# Patient Record
Sex: Female | Born: 1950 | Race: Black or African American | Hispanic: No | Marital: Single | Smoking: Never smoker
Health system: Western US, Academic
[De-identification: ages and names within clinical notes are randomized; demographics above are authoritative.]

## PROBLEM LIST (undated history)

## (undated) DIAGNOSIS — M1990 Unspecified osteoarthritis, unspecified site: Secondary | ICD-10-CM

## (undated) DIAGNOSIS — R51 Headache: Secondary | ICD-10-CM

## (undated) DIAGNOSIS — C439 Malignant melanoma of skin, unspecified: Secondary | ICD-10-CM

## (undated) DIAGNOSIS — E669 Obesity, unspecified: Secondary | ICD-10-CM

## (undated) DIAGNOSIS — I639 Cerebral infarction, unspecified: Secondary | ICD-10-CM

## (undated) DIAGNOSIS — I10 Essential (primary) hypertension: Secondary | ICD-10-CM

## (undated) DIAGNOSIS — E785 Hyperlipidemia, unspecified: Secondary | ICD-10-CM

## (undated) DIAGNOSIS — I208 Other forms of angina pectoris: Principal | ICD-10-CM

## (undated) DIAGNOSIS — Z8489 Family history of other specified conditions: Secondary | ICD-10-CM

## (undated) DIAGNOSIS — C55 Malignant neoplasm of uterus, part unspecified: Secondary | ICD-10-CM

## (undated) DIAGNOSIS — K8309 Other cholangitis: Secondary | ICD-10-CM

## (undated) DIAGNOSIS — M6281 Muscle weakness (generalized): Secondary | ICD-10-CM

## (undated) HISTORY — DX: Malignant melanoma of skin, unspecified: C43.9

## (undated) HISTORY — DX: Unspecified osteoarthritis, unspecified site: M19.90

## (undated) HISTORY — DX: Essential (primary) hypertension: I10

## (undated) HISTORY — DX: Hyperlipidemia, unspecified: E78.5

## (undated) HISTORY — DX: Other forms of angina pectoris: I20.8

## (undated) HISTORY — DX: Obesity, unspecified: E66.9

## (undated) HISTORY — PX: THYROIDECTOMY: SHX17

## (undated) HISTORY — PX: HYSTERECTOMY: SHX81

## (undated) HISTORY — DX: Other cholangitis: K83.09

## (undated) HISTORY — DX: Cerebral infarction, unspecified (CMS-HCC): I63.9

## (undated) SURGERY — ENDOSCOPIC RETROGRADE CHOLANGIOPANCREATOGRAPHY (ERCP)
Anesthesia: Monitored Anesthesia Care (MAC)

## (undated) MED ORDER — LEVOTHYROXINE SODIUM 125 MCG OR TABS
125.00 ug | ORAL_TABLET | Freq: Every day | ORAL | 0 refills | Status: AC
Start: 2018-08-05 — End: ?

## (undated) MED ORDER — NALOXONE HCL 0.4 MG/ML IJ SOLN
0.40 mg | Freq: Once | INTRAMUSCULAR | 0 refills | Status: AC
Start: 2018-04-23 — End: 2018-04-23

## (undated) MED ORDER — HYDROCODONE-ACETAMINOPHEN 5-325 MG OR TABS
1.00 | ORAL_TABLET | Freq: Four times a day (QID) | ORAL | 0 refills | Status: AC | PRN
Start: 2018-06-08 — End: ?

## (undated) MED ORDER — SODIUM CHLORIDE 0.9 % IV SOLN
INTRAVENOUS | Status: AC
Start: 2017-07-24 — End: ?

## (undated) MED ORDER — SYRINGE 23G X 1" 3 ML MISC
Status: AC
Start: 2018-04-23 — End: ?

## (undated) MED ORDER — HYDROCODONE-ACETAMINOPHEN 5-325 MG OR TABS
1.00 | ORAL_TABLET | Freq: Four times a day (QID) | ORAL | 0 refills | Status: AC | PRN
Start: 2018-04-27 — End: ?

## (undated) MED ORDER — LEVOFLOXACIN 500 MG OR TABS
500.00 mg | ORAL_TABLET | Freq: Every day | ORAL | Status: AC
Start: 2018-06-14 — End: ?

## (undated) MED ORDER — CLINDAMYCIN PHOSPHATE IN D5W 600 MG/50ML IV SOLN
600.00 mg | Freq: Three times a day (TID) | INTRAVENOUS | Status: AC
Start: 2018-06-13 — End: ?

## (undated) MED FILL — CHLORHEXIDINE GLUCONATE 0.12 % MT SOLN: 0.12 % | OROMUCOSAL | 16 days supply | Qty: 473 | Fill #0

## (undated) MED FILL — ASCORBIC ACID 500 MG OR TABS: 500 MG | ORAL | 30 days supply | Qty: 30 | Fill #0

---

## 1987-06-14 HISTORY — PX: HEMORRHOID SURGERY: SHX153

## 1999-10-28 ENCOUNTER — Encounter: Admission: RE | Admit: 1999-10-28 | Discharge: 1999-10-28 | Payer: Self-pay | Admitting: Family Medicine

## 1999-10-28 ENCOUNTER — Encounter: Payer: Self-pay | Admitting: Family Medicine

## 2001-10-13 HISTORY — PX: NM MYOCAR PERF WALL MOTION: HXRAD629

## 2001-11-03 ENCOUNTER — Other Ambulatory Visit: Admission: RE | Admit: 2001-11-03 | Discharge: 2001-11-03 | Payer: Self-pay | Admitting: Gynecology

## 2004-03-27 ENCOUNTER — Other Ambulatory Visit: Admission: RE | Admit: 2004-03-27 | Discharge: 2004-03-27 | Payer: Self-pay | Admitting: Gynecology

## 2006-06-05 ENCOUNTER — Encounter: Admission: RE | Admit: 2006-06-05 | Discharge: 2006-06-05 | Payer: Self-pay | Admitting: Family Medicine

## 2006-06-11 ENCOUNTER — Encounter: Admission: RE | Admit: 2006-06-11 | Discharge: 2006-06-11 | Payer: Self-pay | Admitting: Family Medicine

## 2010-03-03 ENCOUNTER — Encounter
Admission: RE | Admit: 2010-03-03 | Discharge: 2010-03-03 | Payer: Self-pay | Admitting: Physical Medicine and Rehabilitation

## 2011-01-06 ENCOUNTER — Other Ambulatory Visit: Payer: Self-pay | Admitting: Neurological Surgery

## 2011-01-06 DIAGNOSIS — M47812 Spondylosis without myelopathy or radiculopathy, cervical region: Secondary | ICD-10-CM

## 2011-01-18 ENCOUNTER — Ambulatory Visit
Admission: RE | Admit: 2011-01-18 | Discharge: 2011-01-18 | Disposition: A | Discharge status: Home / Self Care | Payer: BLUE CROSS/BLUE SHIELD | Source: Ambulatory Visit | Attending: Neurological Surgery | Admitting: Neurological Surgery

## 2011-01-18 DIAGNOSIS — M47812 Spondylosis without myelopathy or radiculopathy, cervical region: Secondary | ICD-10-CM

## 2011-03-11 ENCOUNTER — Other Ambulatory Visit (HOSPITAL_COMMUNITY): Payer: BLUE CROSS/BLUE SHIELD

## 2011-03-12 ENCOUNTER — Ambulatory Visit (HOSPITAL_COMMUNITY)
Admission: RE | Admit: 2011-03-12 | Discharge: 2011-03-12 | Disposition: A | Discharge status: Home / Self Care | Payer: BC Managed Care – PPO | Source: Ambulatory Visit | Attending: Orthopedic Surgery | Admitting: Orthopedic Surgery

## 2011-03-12 ENCOUNTER — Encounter (HOSPITAL_COMMUNITY)
Admission: RE | Admit: 2011-03-12 | Discharge: 2011-03-12 | Disposition: A | Discharge status: Home / Self Care | Payer: BC Managed Care – PPO | Source: Ambulatory Visit | Attending: Orthopedic Surgery | Admitting: Orthopedic Surgery

## 2011-03-12 ENCOUNTER — Other Ambulatory Visit (HOSPITAL_COMMUNITY): Payer: Self-pay | Admitting: Orthopedic Surgery

## 2011-03-12 DIAGNOSIS — M1611 Unilateral primary osteoarthritis, right hip: Secondary | ICD-10-CM

## 2011-03-12 DIAGNOSIS — M169 Osteoarthritis of hip, unspecified: Secondary | ICD-10-CM | POA: Insufficient documentation

## 2011-03-12 DIAGNOSIS — Z01812 Encounter for preprocedural laboratory examination: Secondary | ICD-10-CM | POA: Insufficient documentation

## 2011-03-12 DIAGNOSIS — Z01818 Encounter for other preprocedural examination: Secondary | ICD-10-CM | POA: Insufficient documentation

## 2011-03-12 DIAGNOSIS — M1610 Unilateral primary osteoarthritis, unspecified hip: Secondary | ICD-10-CM | POA: Insufficient documentation

## 2011-03-12 DIAGNOSIS — K449 Diaphragmatic hernia without obstruction or gangrene: Secondary | ICD-10-CM | POA: Insufficient documentation

## 2011-03-12 LAB — SURGICAL PCR SCREEN
MRSA, PCR: NEGATIVE
Staphylococcus aureus: POSITIVE — AB

## 2011-03-12 LAB — PROTIME-INR
INR: 0.94 (ref 0.00–1.49)
Prothrombin Time: 12.8 seconds (ref 11.6–15.2)

## 2011-03-12 LAB — URINALYSIS, ROUTINE W REFLEX MICROSCOPIC
Bilirubin Urine: NEGATIVE
Glucose, UA: NEGATIVE mg/dL
Hgb urine dipstick: NEGATIVE
Ketones, ur: NEGATIVE mg/dL
Nitrite: NEGATIVE
Protein, ur: NEGATIVE mg/dL
Specific Gravity, Urine: 1.004 — ABNORMAL LOW (ref 1.005–1.030)
Urobilinogen, UA: 0.2 mg/dL (ref 0.0–1.0)
pH: 8 (ref 5.0–8.0)

## 2011-03-12 LAB — TYPE AND SCREEN
ABO/RH(D): O POS
Antibody Screen: NEGATIVE

## 2011-03-12 LAB — CBC
HCT: 42.9 % (ref 36.0–46.0)
Hemoglobin: 14.8 g/dL (ref 12.0–15.0)
MCH: 28 pg (ref 26.0–34.0)
MCHC: 34.5 g/dL (ref 30.0–36.0)
MCV: 81.3 fL (ref 78.0–100.0)
Platelets: 225 10*3/uL (ref 150–400)
RBC: 5.28 MIL/uL — ABNORMAL HIGH (ref 3.87–5.11)
RDW: 14.1 % (ref 11.5–15.5)
WBC: 10.1 10*3/uL (ref 4.0–10.5)

## 2011-03-12 LAB — BASIC METABOLIC PANEL
BUN: 11 mg/dL (ref 6–23)
CO2: 29 mEq/L (ref 19–32)
Calcium: 10.1 mg/dL (ref 8.4–10.5)
Chloride: 99 mEq/L (ref 96–112)
Creatinine, Ser: 1.26 mg/dL — ABNORMAL HIGH (ref 0.4–1.2)
GFR calc Af Amer: 52 mL/min — ABNORMAL LOW (ref >60)
GFR calc non Af Amer: 43 mL/min — ABNORMAL LOW (ref >60)
Glucose, Bld: 84 mg/dL (ref 70–99)
Potassium: 3.8 mEq/L (ref 3.5–5.1)
Sodium: 140 mEq/L (ref 135–145)

## 2011-03-12 LAB — DIFFERENTIAL
Basophils Absolute: 0.1 10*3/uL (ref 0.0–0.1)
Basophils Relative: 1 % (ref 0–1)
Eosinophils Absolute: 0.2 10*3/uL (ref 0.0–0.7)
Eosinophils Relative: 2 % (ref 0–5)
Lymphocytes Relative: 20 % (ref 12–46)
Lymphs Abs: 2 10*3/uL (ref 0.7–4.0)
Monocytes Absolute: 0.7 10*3/uL (ref 0.1–1.0)
Monocytes Relative: 7 % (ref 3–12)
Neutro Abs: 7.1 10*3/uL (ref 1.7–7.7)
Neutrophils Relative %: 71 % (ref 43–77)

## 2011-03-12 LAB — URINE MICROSCOPIC-ADD ON

## 2011-03-12 LAB — ABO/RH: ABO/RH(D): O POS

## 2011-03-12 LAB — APTT: aPTT: 29 seconds (ref 24–37)

## 2011-03-14 HISTORY — PX: TOTAL HIP ARTHROPLASTY: SHX124

## 2011-03-17 ENCOUNTER — Inpatient Hospital Stay (HOSPITAL_COMMUNITY): Payer: BC Managed Care – PPO

## 2011-03-17 ENCOUNTER — Inpatient Hospital Stay (HOSPITAL_COMMUNITY)
Admission: RE | Admit: 2011-03-17 | Discharge: 2011-03-20 | DRG: 818 | Disposition: A | Discharge status: Home Health | Payer: BC Managed Care – PPO | Source: Ambulatory Visit | Attending: Orthopedic Surgery | Admitting: Orthopedic Surgery

## 2011-03-17 DIAGNOSIS — Z01812 Encounter for preprocedural laboratory examination: Secondary | ICD-10-CM

## 2011-03-17 DIAGNOSIS — Z7982 Long term (current) use of aspirin: Secondary | ICD-10-CM

## 2011-03-17 DIAGNOSIS — K449 Diaphragmatic hernia without obstruction or gangrene: Secondary | ICD-10-CM | POA: Diagnosis present

## 2011-03-17 DIAGNOSIS — I10 Essential (primary) hypertension: Secondary | ICD-10-CM | POA: Diagnosis present

## 2011-03-17 DIAGNOSIS — M169 Osteoarthritis of hip, unspecified: Principal | ICD-10-CM | POA: Diagnosis present

## 2011-03-17 DIAGNOSIS — M1610 Unilateral primary osteoarthritis, unspecified hip: Principal | ICD-10-CM | POA: Diagnosis present

## 2011-03-17 DIAGNOSIS — Z8673 Personal history of transient ischemic attack (TIA), and cerebral infarction without residual deficits: Secondary | ICD-10-CM

## 2011-03-18 LAB — CBC
HCT: 30.9 % — ABNORMAL LOW (ref 36.0–46.0)
Hemoglobin: 10.2 g/dL — ABNORMAL LOW (ref 12.0–15.0)
MCH: 27.3 pg (ref 26.0–34.0)
MCHC: 33 g/dL (ref 30.0–36.0)
MCV: 82.8 fL (ref 78.0–100.0)
Platelets: 160 10*3/uL (ref 150–400)
RBC: 3.73 MIL/uL — ABNORMAL LOW (ref 3.87–5.11)
RDW: 14.7 % (ref 11.5–15.5)
WBC: 9.3 10*3/uL (ref 4.0–10.5)

## 2011-03-18 LAB — BASIC METABOLIC PANEL
BUN: 11 mg/dL (ref 6–23)
CO2: 29 mEq/L (ref 19–32)
Calcium: 8.8 mg/dL (ref 8.4–10.5)
Chloride: 100 mEq/L (ref 96–112)
Creatinine, Ser: 1.06 mg/dL (ref 0.4–1.2)
GFR calc Af Amer: >60 mL/min (ref >60)
GFR calc non Af Amer: 53 mL/min — ABNORMAL LOW (ref >60)
Glucose, Bld: 128 mg/dL — ABNORMAL HIGH (ref 70–99)
Potassium: 3.2 mEq/L — ABNORMAL LOW (ref 3.5–5.1)
Sodium: 137 mEq/L (ref 135–145)

## 2011-03-18 LAB — PROTIME-INR
INR: 1.09 (ref 0.00–1.49)
Prothrombin Time: 14.3 seconds (ref 11.6–15.2)

## 2011-03-19 LAB — CBC
HCT: 31.1 % — ABNORMAL LOW (ref 36.0–46.0)
Hemoglobin: 10.3 g/dL — ABNORMAL LOW (ref 12.0–15.0)
MCH: 27.4 pg (ref 26.0–34.0)
MCHC: 33.1 g/dL (ref 30.0–36.0)
MCV: 82.7 fL (ref 78.0–100.0)
Platelets: 152 10*3/uL (ref 150–400)
RBC: 3.76 MIL/uL — ABNORMAL LOW (ref 3.87–5.11)
RDW: 14.8 % (ref 11.5–15.5)
WBC: 11.8 10*3/uL — ABNORMAL HIGH (ref 4.0–10.5)

## 2011-03-19 LAB — PROTIME-INR
INR: 1.37 (ref 0.00–1.49)
Prothrombin Time: 17.1 seconds — ABNORMAL HIGH (ref 11.6–15.2)

## 2011-03-20 LAB — CBC
HCT: 31.7 % — ABNORMAL LOW (ref 36.0–46.0)
Hemoglobin: 10.6 g/dL — ABNORMAL LOW (ref 12.0–15.0)
MCH: 27.8 pg (ref 26.0–34.0)
MCHC: 33.4 g/dL (ref 30.0–36.0)
MCV: 83.2 fL (ref 78.0–100.0)
Platelets: 140 10*3/uL — ABNORMAL LOW (ref 150–400)
RBC: 3.81 MIL/uL — ABNORMAL LOW (ref 3.87–5.11)
RDW: 14.5 % (ref 11.5–15.5)
WBC: 10.3 10*3/uL (ref 4.0–10.5)

## 2011-03-20 LAB — PROTIME-INR
INR: 1.57 — ABNORMAL HIGH (ref 0.00–1.49)
Prothrombin Time: 19 seconds — ABNORMAL HIGH (ref 11.6–15.2)

## 2011-03-25 NOTE — Op Note (Signed)
Morgan Bell, Morgan Bell              ACCOUNT NO.:  [id number]  MEDICAL RECORD NO.:  [id number]  LOCATION:                                 FACILITY:  PHYSICIAN:  Feliberto Gottron. Turner Daniels, M.D.   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  On the femoral side, we used a 18 x 13 x 42 x 150 SROM stem with an 18D large cone.  SURGEON:  Feliberto Gottron. Turner Daniels, MD  FIRST ASSISTANT:  Shirl Harris, PA-C.  ANESTHETIC:  General endotracheal.  ESTIMATED BLOOD LOSS:  400 mL.  FLUID REPLACEMENT:  1500 mL crystalloid.  DRAINS PLACED:  Foley catheter.  URINE OUTPUT:  300 mL.  INDICATIONS FOR PROCEDURE:  The patient is a 60 year old woman with end- stage arthritis, right hip bone-on-bone with varus configuration to the femoral neck.  She has failed conservative measures with physical therapy, anti-inflammatory medicine, narcotics and desires elective right total hip arthroplasty to decrease pain and increase function. Risks and benefits of surgery discussed, questions answered.  DESCRIPTION OF PROCEDURE:  The patient identified by armband, received preoperative IV antibiotics in the holding area at [facility], taken to operating room 5, appropriate site monitors were attached and general endotracheal anesthesia induced with the patient in supine position.  Foley catheter inserted.  She was then rolled into the left lateral decubitus position and fixed there with a Stulberg Mark II pelvic clamp.  Right lower extremity prepped and draped in usual  sterile fashion from the ankle to hemipelvis.  Time-out procedure performed. Skin along the lateral hip and thigh infiltrated with 10 mL of 0.5% Marcaine and epinephrine solution and a 15-cm incision was made centered over the greater trochanter allowing posterolateral approach to the hip joint.  Small bleeders in the skin and subcutaneous tissue identified and cauterized.  IT band was cut in line with skin incision exposing the greater trochanter.  Cobra retractors were placed between the gluteus minimus and superior hip joint capsule and quadratus femoris and the inferior hip joint capsule.  This isolated the piriformis and short external rotators which were then tagged with a #2 Ethibond suture and cut off their insertion on the intertrochanteric crest exposing posterior aspect of the hip joint capsule which developed an acetabular based flap going from posterior-superior off the acetabular rim out over the femoral neck and exiting posterior inferior on the acetabular rim. This was likewise tagged to #2 Ethibond sutures and exposed thearthritic femoral head.  The hip flexed and internal rotated, standard neck cut performed one fingerbreadth above the femoral head with the oscillating saw.  The proximal femur was then translated anteriorly levering off the anterior column with a Hohmann retractor.  Spike Cobra was placed in the cotyloid notch and posterior inferior wing retractor placed at the junction of the  ischium and the acetabulum to allow for removal of the labrum.  We then sequentially reamed up from a 47-mm basket reamer to a 49-mm basket reamer obtaining good coverage in all quadrants in preparation for insertion of a 50-mm pinnacle cup with no holes and 40-45 degrees of abduction and about 15 degrees of anteversion.  The stem was irrigated and the cup inserted without difficulty with good fit and fill.  Trial liner 10 degree index posterior and superior was inserted in  preparation for a 32-mm femoral head.  We then flexed and internally rotated the hip and actually reamed up to a 13.5-mm axial reamer to full depth with good chatter to 14-mm half depth and then reamed the cone up to an 18D cone of the appropriate depth for a 42 base neck.  The calcar was milled to large calcar and an 18 D large trial cone was then placed.  Prior to the calcar milling, we did ream up to an 18D calcar again for the appropriate depth for 42 base neck.  An 18D large trial cone had been inserted followed by a trial stem.  We did antevert about 10 degrees in relation to the calcar because the calcar maybe 5 degrees of anteversion in relation to the epicondylar axis of the knee.  Trial reduction was then performed with a 32 trial head.  It was noted to be stable to flexion of 90 internal rotation of almost 75 external rotation and extension.  There is no impingement at 40 degrees and it could not be dislocated anteriorly.  At this point, all the trial components removed.  We inserted a real 10 degree polyethylene liner to accept 32-mm head after first placing the central occluder.  The index on the polyethylene liner was posterior- superior.  We then hammered into place a real 18D large ATT1 cone.  At this time, there was noted to be a very small crack just above the cone in the greater trochanter that involved one-quarter inch of the posterior aspect and did not propagate beyond the posterolateral corner. We stressed the lateral and anterior portions significantly and the crack was not noted to propagate.  It was felt to be stable and left alone.  After the stem had been fully inserted and seated, a +0 32-mm ceramic head was hammered onto the stem and the hip was reduced and stability once again checked.  We also checked trochanter to make sure it was stable and it was.  Also the crack was below the origin of the vastus lateralis, so it had muscle pull to stabilize it as well  but it was above the shoulder of the stem.  At this point, the wound was irrigated out with normal saline solution.  The capsular flap and short external rotators were repaired back to the intertrochanteric crest through drill holes.  We irrigated one more time.  The IT band was closed with running #1 Vicryl suture, the subcutaneous tissue with 0 and 2-0 undyed Vicryl suture and skin with running interlocking 3-0 nylon suture.  Dressing of Xeroform and Mepilex was then applied.  The patient was unclamped, rolled supine, awakened, extubated, and taken to the recovery room without difficulty.     Feliberto Gottron. Turner Daniels, M.D.     Ovid Curd  D:  03/17/2011  T:  03/17/2011  Job:  [id number]  Electronically Signed by Gean Birchwood M.D. on 03/24/2011 11:10:19 PM

## 2011-04-05 NOTE — Discharge Summary (Signed)
  [place], [place]              ACCOUNT NO.:  [id number]  MEDICAL RECORD NO.:  [id number]  LOCATION:  5033                         FACILITY:  [facility]  PHYSICIAN:  Feliberto Gottron. Turner Daniels, M.D.   DATE OF BIRTH:  04-09-1951  DATE OF ADMISSION:  03/17/2011 DATE OF DISCHARGE:  03/20/2011                              DISCHARGE SUMMARY   CHIEF COMPLAINT:  Right hip pain.  HISTORY OF PRESENT ILLNESS:  This is a 60 year old lady who complains of severe unremitting pain in her right hip despite extensive conservative treatment and activity modification.  She now desires a surgical intervention.  All risks and benefits of surgery were discussed with the patient.  PAST MEDICAL HISTORY:  Significant for hypertension.  ALLERGIES:  She has an allergy to SULFA.  PAST SURGICAL HISTORY:  She has never had surgery.  SOCIAL HISTORY:  She denies use of tobacco or alcohol.  FAMILY HISTORY:  Noncontributory.  OBJECTIVE:  Gross examination of the right hip demonstrates pain with internal rotation and a positive foot tap.  X-rays of the right hip demonstrates bone-on-bone degenerative joint disease.  PREOPERATIVE LABORATORY DATA:  White blood cells 9.3, red blood cells 3.73, hemoglobin 10.2, hematocrit 30.9, and platelets 160.  PT 14.3 and INR 1.09.  Sodium 137, potassium 3.2, chloride 100, glucose 128, BUN 11, and creatinine 0.06.  Urinalysis was within normal limits.  HOSPITAL COURSE:  Ms. Peckenpaugh was admitted to [facility] on March 17, 2011 when she underwent right total hip arthroplasty.  The procedure was performed by Dr. Gean Birchwood and the patient tolerated it well. Perioperative Foley catheter was placed and she was transferred to the floor on Lovenox and Coumadin for DVT prophylaxis.  On the first postoperative day, she was awake and alert and tolerating p.o. intake. Her Foley catheter was removed after physical therapy.  Hemoglobin was 10.2.  On the second postoperative day, she was awake but  groggy so her pain medication was reduced.  Hemoglobin was 10.3.  There is a scant drainage on the surgical dressing.  On postoperative day #3, she was much more alert and was reporting good pain control.  She was eating well and ambulating independently, so she was discharged home.  DISPOSITION:  The patient was discharged home on March 20, 2011.  She was weightbearing as tolerated and would return to the clinic in 10 days for x-rays and suture removal.  She will remain on Coumadin for 2 weeks with a target INR of 1.5-2.0.  This will be managed by her Home Health company.  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     Shirl Harris, PA   ______________________________ Feliberto Gottron. Turner Daniels, M.D.    JW/MEDQ  D:  04/02/2011  T:  04/03/2011  Job:  [id number]  Electronically Signed by Shirl Harris PA on 04/04/2011 01:27:39 PM Electronically Signed by Gean Birchwood M.D. on 04/05/2011 08:05:05 PM

## 2013-01-25 ENCOUNTER — Encounter (INDEPENDENT_AMBULATORY_CARE_PROVIDER_SITE_OTHER): Payer: Self-pay

## 2013-01-26 ENCOUNTER — Encounter (INDEPENDENT_AMBULATORY_CARE_PROVIDER_SITE_OTHER): Payer: Self-pay

## 2013-01-31 ENCOUNTER — Ambulatory Visit (INDEPENDENT_AMBULATORY_CARE_PROVIDER_SITE_OTHER): Payer: No Typology Code available for payment source | Admitting: General Surgery

## 2013-01-31 ENCOUNTER — Encounter (INDEPENDENT_AMBULATORY_CARE_PROVIDER_SITE_OTHER): Payer: Self-pay | Admitting: General Surgery

## 2013-01-31 ENCOUNTER — Ambulatory Visit (INDEPENDENT_AMBULATORY_CARE_PROVIDER_SITE_OTHER): Payer: PRIVATE HEALTH INSURANCE | Admitting: General Surgery

## 2013-01-31 ENCOUNTER — Telehealth (INDEPENDENT_AMBULATORY_CARE_PROVIDER_SITE_OTHER): Payer: Self-pay | Admitting: General Surgery

## 2013-01-31 VITALS — BP 150/88 | HR 102 | Temp 97.0°F | Resp 22 | Ht 62.5 in | Wt 186.6 lb

## 2013-01-31 DIAGNOSIS — C4359 Malignant melanoma of other part of trunk: Secondary | ICD-10-CM | POA: Insufficient documentation

## 2013-01-31 NOTE — Patient Instructions (Signed)
Will get lymphoscintogram and then schedule wide excision and sentinel node mapping

## 2013-01-31 NOTE — Telephone Encounter (Signed)
Spoke with patient she is aware of  appt day 02/10/13 at [address] [facility]

## 2013-01-31 NOTE — Progress Notes (Signed)
Subjective:     Patient ID: Morgan Bell, female   DOB: 1951-06-30, 62 y.o.   MRN: [id number]  HPI We're asked to see the patient in consultation by Dr. Terri Piedra to evaluate her for a melanoma on her abdominal wall. The patient is a 62 year old white female who has had small moles on her scan for a long time. She recently went for her first check by a dermatologist who biopsied 2 lesions. The one on her left shoulder was benign. The one on her right upper quadrant abdominal wall came back as a melanoma. She states that it was not ulcerated prior to the biopsy. She denied any itching or bleeding. She had not noticed it changed recently but said that it was irregular around the border.  Review of Systems  Constitutional: Negative.   HENT: Negative.   Eyes: Negative.   Respiratory: Negative.   Cardiovascular: Negative.   Gastrointestinal: Negative.   Endocrine: Negative.   Genitourinary: Negative.   Musculoskeletal: Negative.   Skin: Negative.   Allergic/Immunologic: Negative.   Neurological: Negative.   Hematological: Negative.   Psychiatric/Behavioral: Negative.        Objective:   Physical Exam  Constitutional: She is oriented to person, place, and time. She appears well-developed and well-nourished.  HENT:  Head: Normocephalic and atraumatic.  Eyes: Conjunctivae and EOM are normal. Pupils are equal, round, and reactive to light.  Neck: Normal range of motion. Neck supple.  Cardiovascular: Normal rate, regular rhythm and normal heart sounds.   Pulmonary/Chest: Effort normal and breath sounds normal.  There is no palpable axillary or supraclavicular or cervical lymphadenopathy  Abdominal: Soft. Bowel sounds are normal. She exhibits no mass. There is no tenderness.  There is a small eschar in the right upper quadrant of the abdominal wall. There is no palpable groin lymphadenopathy  Musculoskeletal: Normal range of motion.  Lymphadenopathy:    She has no cervical adenopathy.   Neurological: She is alert and oriented to person, place, and time.  Skin: Skin is warm and dry.  Psychiatric: She has a normal mood and affect. Her behavior is normal.       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 Because of this I think she would be best treated with a wide excision of the area as well as a sentinel node mapping. I have discussed with her in detail the risks and benefits of the operation to this as well as some of the technical aspects and she understands and wishes to proceed     Plan:     Plan for initial lymphoscintigraphy to identify the appropriate nodal basin and then will plan for wide excision of the area with sentinel node mapping

## 2013-02-10 ENCOUNTER — Encounter (HOSPITAL_COMMUNITY)
Admission: RE | Admit: 2013-02-10 | Discharge: 2013-02-10 | Disposition: A | Discharge status: Home / Self Care | Payer: No Typology Code available for payment source | Source: Ambulatory Visit | Attending: General Surgery | Admitting: General Surgery

## 2013-02-10 DIAGNOSIS — C4359 Malignant melanoma of other part of trunk: Secondary | ICD-10-CM | POA: Insufficient documentation

## 2013-02-10 MED ORDER — TECHNETIUM TC 99M SULFUR COLLOID FILTERED
0.5000 | Freq: Once | INTRAVENOUS | Status: AC | PRN
  Administered 2013-02-10: 0.5 via INTRADERMAL

## 2013-02-16 ENCOUNTER — Other Ambulatory Visit (INDEPENDENT_AMBULATORY_CARE_PROVIDER_SITE_OTHER): Payer: Self-pay | Admitting: General Surgery

## 2013-02-16 DIAGNOSIS — C439 Malignant melanoma of skin, unspecified: Secondary | ICD-10-CM

## 2013-02-28 ENCOUNTER — Encounter (HOSPITAL_COMMUNITY)
Admission: RE | Admit: 2013-02-28 | Discharge: 2013-02-28 | Disposition: A | Discharge status: Home / Self Care | Payer: No Typology Code available for payment source | Source: Ambulatory Visit | Attending: General Surgery | Admitting: General Surgery

## 2013-02-28 ENCOUNTER — Encounter (HOSPITAL_COMMUNITY): Payer: Self-pay

## 2013-02-28 ENCOUNTER — Encounter (HOSPITAL_COMMUNITY): Payer: Self-pay | Admitting: Pharmacy Technician

## 2013-02-28 ENCOUNTER — Ambulatory Visit (HOSPITAL_COMMUNITY)
Admission: RE | Admit: 2013-02-28 | Discharge: 2013-02-28 | Disposition: A | Discharge status: Home / Self Care | Payer: No Typology Code available for payment source | Source: Ambulatory Visit | Attending: General Surgery | Admitting: General Surgery

## 2013-02-28 DIAGNOSIS — Z01818 Encounter for other preprocedural examination: Secondary | ICD-10-CM | POA: Insufficient documentation

## 2013-02-28 DIAGNOSIS — K449 Diaphragmatic hernia without obstruction or gangrene: Secondary | ICD-10-CM | POA: Insufficient documentation

## 2013-02-28 DIAGNOSIS — I10 Essential (primary) hypertension: Secondary | ICD-10-CM | POA: Insufficient documentation

## 2013-02-28 HISTORY — DX: Cerebral infarction, unspecified: I63.9

## 2013-02-28 HISTORY — DX: Headache: R51

## 2013-02-28 LAB — SURGICAL PCR SCREEN
MRSA, PCR: NEGATIVE
Staphylococcus aureus: POSITIVE — AB

## 2013-02-28 LAB — CBC WITH DIFFERENTIAL/PLATELET
Basophils Absolute: 0.1 10*3/uL (ref 0.0–0.1)
Basophils Relative: 1 % (ref 0–1)
Eosinophils Absolute: 0.3 10*3/uL (ref 0.0–0.7)
Eosinophils Relative: 3 % (ref 0–5)
HCT: 37.3 % (ref 36.0–46.0)
Hemoglobin: 11.9 g/dL — ABNORMAL LOW (ref 12.0–15.0)
Lymphocytes Relative: 21 % (ref 12–46)
Lymphs Abs: 2.3 10*3/uL (ref 0.7–4.0)
MCH: 26.2 pg (ref 26.0–34.0)
MCHC: 31.9 g/dL (ref 30.0–36.0)
MCV: 82 fL (ref 78.0–100.0)
Monocytes Absolute: 1 10*3/uL (ref 0.1–1.0)
Monocytes Relative: 9 % (ref 3–12)
Neutro Abs: 7.5 10*3/uL (ref 1.7–7.7)
Neutrophils Relative %: 67 % (ref 43–77)
Platelets: 314 10*3/uL (ref 150–400)
RBC: 4.55 MIL/uL (ref 3.87–5.11)
RDW: 15.2 % (ref 11.5–15.5)
WBC: 11.1 10*3/uL — ABNORMAL HIGH (ref 4.0–10.5)

## 2013-02-28 LAB — COMPREHENSIVE METABOLIC PANEL
ALT: 23 U/L (ref 0–35)
AST: 34 U/L (ref 0–37)
Albumin: 3.8 g/dL (ref 3.5–5.2)
Alkaline Phosphatase: 88 U/L (ref 39–117)
BUN: 26 mg/dL — ABNORMAL HIGH (ref 6–23)
CO2: 25 mEq/L (ref 19–32)
Calcium: 10.4 mg/dL (ref 8.4–10.5)
Chloride: 97 mEq/L (ref 96–112)
Creatinine, Ser: 1.21 mg/dL — ABNORMAL HIGH (ref 0.50–1.10)
GFR calc Af Amer: 54 mL/min — ABNORMAL LOW (ref >90)
GFR calc non Af Amer: 46 mL/min — ABNORMAL LOW (ref >90)
Glucose, Bld: 103 mg/dL — ABNORMAL HIGH (ref 70–99)
Potassium: 3 mEq/L — ABNORMAL LOW (ref 3.5–5.1)
Sodium: 139 mEq/L (ref 135–145)
Total Bilirubin: 0.2 mg/dL — ABNORMAL LOW (ref 0.3–1.2)
Total Protein: 7.4 g/dL (ref 6.0–8.3)

## 2013-02-28 NOTE — Pre-Procedure Instructions (Signed)
CHAVY AVERA  02/28/2013   Your procedure is scheduled on:   Wednesday  03/02/13   Report to [facility] at 930 AM.  Call this number if you have problems the morning of surgery: [phone]   Remember:   Do not eat food or drink liquids after midnight.   Take these medicines the morning of surgery with A SIP OF WATER:  CLARITIN, GABAPENTIN (STOP ASPIRIN, COUMADIN, PLAVIX, EFFIENT, HERBAL MEDICINES)   Do not wear jewelry, make-up or nail polish.  Do not wear lotions, powders, or perfumes. You may wear deodorant.  Do not shave 48 hours prior to surgery. Men may shave face and neck.  Do not bring valuables to the hospital.  Contacts, dentures or bridgework may not be worn into surgery.  Leave suitcase in the car. After surgery it may be brought to your room.  For patients admitted to the hospital, checkout time is 11:00 AM the day of  discharge.   Patients discharged the day of surgery will not be allowed to drive  home.  Name and phone number of your driver:   Special Instructions: Shower using CHG 2 nights before surgery and the night before surgery.  If you shower the day of surgery use CHG.  Use special wash - you have one bottle of CHG for all showers.  You should use approximately 1/3 of the bottle for each shower.   Please read over the following fact sheets that you were given: Pain Booklet, Coughing and Deep Breathing, MRSA Information and Surgical Site Infection Prevention

## 2013-02-28 NOTE — Progress Notes (Signed)
REQUESTED LAST EKG, OFFICE NOTE, ANY CARDIAC TESTS FROM [facility]  [phone].

## 2013-02-28 NOTE — Progress Notes (Signed)
CONFIRMED WITH SHANNON IN [facility] THAT PATIENT IS SCHEDULED FOR 1000 AM 03/02/13.

## 2013-02-28 NOTE — Progress Notes (Signed)
02/28/13 1516  OBSTRUCTIVE SLEEP APNEA  Have you ever been diagnosed with sleep apnea through a sleep study? No  Do you snore loudly (loud enough to be heard through closed doors)?  1  Do you often feel tired, fatigued, or sleepy during the daytime? 1  Has anyone observed you stop breathing during your sleep? 0  Do you have, or are you being treated for high blood pressure? 1  BMI more than 35 kg/m2? 1  Age over 64 years old? 1  Neck circumference greater than 40 cm/18 inches? 0 (15 IN )  Gender: 0  Obstructive Sleep Apnea Score 5  Score 4 or greater  Results sent to PCP

## 2013-03-01 MED ORDER — CHLORHEXIDINE GLUCONATE 4 % EX LIQD: 1.0000 "application " | Freq: Once | CUTANEOUS | Status: DC

## 2013-03-01 MED ORDER — CEFAZOLIN SODIUM-DEXTROSE 2-3 GM-% IV SOLR
2.0000 g | INTRAVENOUS | Status: AC
  Administered 2013-03-02: 2 g via INTRAVENOUS
  Filled 2013-03-01: qty 50

## 2013-03-01 NOTE — Progress Notes (Signed)
Anesthesia Chart Review:  Patient is a 62 year old female scheduled for wide excision right abdominal wall melanoma and SN mapping, right axilla by Dr. Carolynne Edouard on 03/02/13.  History includes non-smoker, TIA > 20 years ago, HTN, melanoma, migraine headaches, arthritis, right THA '12, obesity, large hiatal hernia by 02/2013 CXR. OSA screening score was 5.  No PCP is listed.    She is followed at [facility] for HTN and obesity, and was last seen by Dr. Rennis Golden on 11/19/12.  EKG then showed NSR, non-specific T wave abnormality.  She has not had a recent stress or echo.   CXR report on 02/28/13 showed: The heart size is normal. No pleural effusion or edema identified. There is no airspace consolidation identified. The patient has a large hiatal hernia containing fluid level. There is compressive type atelectasis identified within the medial left lower lobe. Spondylosis identified within the thoracic spine.  Preoperative labs noted.  K+ 3.0.  Cr 1.21.  WBC 11.1. Glucose 103.  Preoperative testing results appear within an acceptable range from an anesthesia standpoint.  She will be evaluated by her assigned anesthesiologist on the day of surgery, but if no significant change then would anticipate she could proceed as planned.  Morgan Bell [facility]/Anesthesiology Phone [phone] 03/01/2013 11:57 AM

## 2013-03-02 ENCOUNTER — Ambulatory Visit (HOSPITAL_COMMUNITY): Payer: No Typology Code available for payment source | Admitting: Anesthesiology

## 2013-03-02 ENCOUNTER — Encounter (HOSPITAL_COMMUNITY): Payer: Self-pay | Admitting: Vascular Surgery

## 2013-03-02 ENCOUNTER — Encounter (HOSPITAL_COMMUNITY)
Admission: RE | Admit: 2013-03-02 | Discharge: 2013-03-02 | Disposition: A | Discharge status: Home / Self Care | Payer: No Typology Code available for payment source | Source: Ambulatory Visit | Attending: General Surgery | Admitting: General Surgery

## 2013-03-02 ENCOUNTER — Encounter (HOSPITAL_COMMUNITY): Payer: Self-pay | Admitting: *Deleted

## 2013-03-02 ENCOUNTER — Ambulatory Visit (HOSPITAL_COMMUNITY)
Admission: RE | Admit: 2013-03-02 | Discharge: 2013-03-02 | Disposition: A | Discharge status: Home / Self Care | Payer: No Typology Code available for payment source | Source: Ambulatory Visit | Attending: General Surgery | Admitting: General Surgery

## 2013-03-02 ENCOUNTER — Encounter (HOSPITAL_COMMUNITY)
Admission: RE | Disposition: A | Discharge status: Home / Self Care | Payer: Self-pay | Source: Ambulatory Visit | Attending: General Surgery

## 2013-03-02 DIAGNOSIS — C4359 Malignant melanoma of other part of trunk: Secondary | ICD-10-CM | POA: Insufficient documentation

## 2013-03-02 DIAGNOSIS — I10 Essential (primary) hypertension: Secondary | ICD-10-CM | POA: Insufficient documentation

## 2013-03-02 DIAGNOSIS — C439 Malignant melanoma of skin, unspecified: Secondary | ICD-10-CM

## 2013-03-02 HISTORY — PX: MELANOMA EXCISION WITH SENTINEL LYMPH NODE BIOPSY: SHX5267

## 2013-03-02 SURGERY — MELANOMA EXCISION WITH SENTINEL LYMPH NODE BIOPSY
Anesthesia: General | Laterality: Right

## 2013-03-02 MED ORDER — LACTATED RINGERS IV SOLN
INTRAVENOUS | Status: DC | PRN
  Administered 2013-03-02: 11:00:00 via INTRAVENOUS

## 2013-03-02 MED ORDER — HYDROMORPHONE HCL PF 1 MG/ML IJ SOLN
INTRAMUSCULAR | Status: AC
  Filled 2013-03-02: qty 1

## 2013-03-02 MED ORDER — HYDROMORPHONE HCL PF 1 MG/ML IJ SOLN: 0.2500 mg | INTRAMUSCULAR | Status: DC | PRN

## 2013-03-02 MED ORDER — LIDOCAINE HCL (CARDIAC) 20 MG/ML IV SOLN
INTRAVENOUS | Status: DC | PRN
  Administered 2013-03-02: 80 mg via INTRAVENOUS

## 2013-03-02 MED ORDER — HYDROCODONE-ACETAMINOPHEN 5-325 MG PO TABS: 1.0000 | ORAL_TABLET | ORAL | Status: DC | PRN

## 2013-03-02 MED ORDER — FENTANYL CITRATE 0.05 MG/ML IJ SOLN
INTRAMUSCULAR | Status: DC | PRN
  Administered 2013-03-02: 25 ug via INTRAVENOUS
  Administered 2013-03-02: 50 ug via INTRAVENOUS
  Administered 2013-03-02: 25 ug via INTRAVENOUS
  Administered 2013-03-02: 50 ug via INTRAVENOUS
  Administered 2013-03-02: 25 ug via INTRAVENOUS

## 2013-03-02 MED ORDER — PROPOFOL 10 MG/ML IV BOLUS
INTRAVENOUS | Status: DC | PRN
  Administered 2013-03-02: 200 mg via INTRAVENOUS

## 2013-03-02 MED ORDER — METHYLENE BLUE 1 % INJ SOLN
INTRAMUSCULAR | Status: AC
  Filled 2013-03-02: qty 10

## 2013-03-02 MED ORDER — PHENYLEPHRINE HCL 10 MG/ML IJ SOLN
INTRAMUSCULAR | Status: DC | PRN
  Administered 2013-03-02 (×2): 80 ug via INTRAVENOUS
  Administered 2013-03-02 (×2): 40 ug via INTRAVENOUS
  Administered 2013-03-02 (×2): 80 ug via INTRAVENOUS

## 2013-03-02 MED ORDER — MUPIROCIN 2 % EX OINT
TOPICAL_OINTMENT | CUTANEOUS | Status: AC
  Administered 2013-03-02: 10:00:00
  Filled 2013-03-02: qty 22

## 2013-03-02 MED ORDER — MUPIROCIN CALCIUM 2 % EX CREA: TOPICAL_CREAM | Freq: Two times a day (BID) | CUTANEOUS | Status: DC

## 2013-03-02 MED ORDER — METHYLENE BLUE 1 % INJ SOLN
INTRAMUSCULAR | Status: DC | PRN
  Administered 2013-03-02: 2 mL via SUBMUCOSAL

## 2013-03-02 MED ORDER — 0.9 % SODIUM CHLORIDE (POUR BTL) OPTIME
TOPICAL | Status: DC | PRN
  Administered 2013-03-02: 1000 mL

## 2013-03-02 MED ORDER — BUPIVACAINE-EPINEPHRINE PF 0.25-1:200000 % IJ SOLN
INTRAMUSCULAR | Status: AC
  Filled 2013-03-02: qty 30

## 2013-03-02 MED ORDER — ONDANSETRON HCL 4 MG/2ML IJ SOLN
INTRAMUSCULAR | Status: DC | PRN
  Administered 2013-03-02: 4 mg via INTRAVENOUS

## 2013-03-02 MED ORDER — BUPIVACAINE-EPINEPHRINE PF 0.25-1:200000 % IJ SOLN
INTRAMUSCULAR | Status: DC | PRN
  Administered 2013-03-02: 30 mL

## 2013-03-02 MED ORDER — DEXAMETHASONE SODIUM PHOSPHATE 10 MG/ML IJ SOLN
INTRAMUSCULAR | Status: DC | PRN
  Administered 2013-03-02: 8 mg via INTRAVENOUS

## 2013-03-02 MED ORDER — TECHNETIUM TC 99M SULFUR COLLOID FILTERED
0.5000 | Freq: Once | INTRAVENOUS | Status: AC | PRN
  Administered 2013-03-02: 0.5 via INTRADERMAL

## 2013-03-02 MED ORDER — SODIUM CHLORIDE 0.9 % IJ SOLN
INTRAMUSCULAR | Status: DC | PRN
  Administered 2013-03-02: 3 mL via INTRAVENOUS

## 2013-03-02 SURGICAL SUPPLY — 59 items
ADH SKN CLS APL DERMABOND .7 (GAUZE/BANDAGES/DRESSINGS) ×2
APL SKNCLS STERI-STRIP NONHPOA (GAUZE/BANDAGES/DRESSINGS)
APPLIER CLIP 11 MED OPEN (CLIP)
APR CLP MED 11 20 MLT OPN (CLIP)
BENZOIN TINCTURE PRP APPL 2/3 (GAUZE/BANDAGES/DRESSINGS) ×1 IMPLANT
BLADE SURG CLIPPER 3M 9600 (MISCELLANEOUS) ×2 IMPLANT
CANISTER SUCTION 2500CC (MISCELLANEOUS) ×2 IMPLANT
CHLORAPREP W/TINT 26ML (MISCELLANEOUS) ×2 IMPLANT
CLIP APPLIE 11 MED OPEN (CLIP) IMPLANT
CLOTH BEACON ORANGE TIMEOUT ST (SAFETY) ×2 IMPLANT
CONT SPEC 4OZ CLIKSEAL STRL BL (MISCELLANEOUS) ×2 IMPLANT
COVER PROBE W GEL 5X96 (DRAPES) ×2 IMPLANT
COVER SURGICAL LIGHT HANDLE (MISCELLANEOUS) ×2 IMPLANT
DERMABOND ADVANCED (GAUZE/BANDAGES/DRESSINGS) ×2
DERMABOND ADVANCED .7 DNX12 (GAUZE/BANDAGES/DRESSINGS) IMPLANT
DRAPE LAPAROSCOPIC ABDOMINAL (DRAPES) ×1 IMPLANT
DRAPE UTILITY 15X26 W/TAPE STR (DRAPE) ×4 IMPLANT
ELECT CAUTERY BLADE 6.4 (BLADE) ×2 IMPLANT
ELECT REM PT RETURN 9FT ADLT (ELECTROSURGICAL) ×2
ELECTRODE REM PT RTRN 9FT ADLT (ELECTROSURGICAL) ×1 IMPLANT
EVACUATOR SILICONE 100CC (DRAIN) IMPLANT
GLOVE BIO SURGEON STRL SZ7.5 (GLOVE) ×3 IMPLANT
GLOVE BIOGEL PI IND STRL 6.5 (GLOVE) IMPLANT
GLOVE BIOGEL PI IND STRL 7.5 (GLOVE) ×1 IMPLANT
GLOVE BIOGEL PI INDICATOR 6.5 (GLOVE) ×1
GLOVE BIOGEL PI INDICATOR 7.5 (GLOVE) ×1
GLOVE ECLIPSE 7.5 STRL STRAW (GLOVE) ×1 IMPLANT
GOWN STRL NON-REIN LRG LVL3 (GOWN DISPOSABLE) ×4 IMPLANT
KIT BASIN OR (CUSTOM PROCEDURE TRAY) ×2 IMPLANT
KIT ROOM TURNOVER OR (KITS) ×2 IMPLANT
NDL 18GX1X1/2 (RX/OR ONLY) (NEEDLE) ×1 IMPLANT
NDL HYPO 25GX1X1/2 BEV (NEEDLE) ×1 IMPLANT
NEEDLE 18GX1X1/2 (RX/OR ONLY) (NEEDLE) ×2 IMPLANT
NEEDLE HYPO 25GX1X1/2 BEV (NEEDLE) ×4 IMPLANT
NS IRRIG 1000ML POUR BTL (IV SOLUTION) ×2 IMPLANT
PACK GENERAL/GYN (CUSTOM PROCEDURE TRAY) ×2 IMPLANT
PACK UNIVERSAL I (CUSTOM PROCEDURE TRAY) ×1 IMPLANT
PAD ARMBOARD 7.5X6 YLW CONV (MISCELLANEOUS) ×3 IMPLANT
SPECIMEN JAR MEDIUM (MISCELLANEOUS) IMPLANT
SPONGE GAUZE 4X4 12PLY (GAUZE/BANDAGES/DRESSINGS) IMPLANT
SPONGE LAP 18X18 X RAY DECT (DISPOSABLE) IMPLANT
SPONGE LAP 4X18 X RAY DECT (DISPOSABLE) ×2 IMPLANT
STAPLER VISISTAT 35W (STAPLE) IMPLANT
STRIP CLOSURE SKIN 1/2X4 (GAUZE/BANDAGES/DRESSINGS) ×1 IMPLANT
SUT MNCRL AB 4-0 PS2 18 (SUTURE) ×2 IMPLANT
SUT MON AB 4-0 PC3 18 (SUTURE) ×4 IMPLANT
SUT SILK 2 0 FS (SUTURE) ×1 IMPLANT
SUT SILK 2 0 SH (SUTURE) IMPLANT
SUT VIC AB 2-0 BRD 54 (SUTURE) IMPLANT
SUT VIC AB 2-0 SH 18 (SUTURE) IMPLANT
SUT VIC AB 3-0 54X BRD REEL (SUTURE) ×1 IMPLANT
SUT VIC AB 3-0 BRD 54 (SUTURE) ×2
SUT VIC AB 3-0 SH 18 (SUTURE) ×1 IMPLANT
SUT VIC AB 3-0 SH 27 (SUTURE) ×4
SUT VIC AB 3-0 SH 27XBRD (SUTURE) ×1 IMPLANT
SYR CONTROL 10ML LL (SYRINGE) ×3 IMPLANT
TOWEL OR 17X24 6PK STRL BLUE (TOWEL DISPOSABLE) IMPLANT
TOWEL OR 17X26 10 PK STRL BLUE (TOWEL DISPOSABLE) ×2 IMPLANT
WATER STERILE IRR 1000ML POUR (IV SOLUTION) ×1 IMPLANT

## 2013-03-02 NOTE — Op Note (Signed)
03/02/2013  12:34 PM  PATIENT:  Morgan Bell  63 y.o. female  PRE-OPERATIVE DIAGNOSIS:  right abdominal wall melanoma  POST-OPERATIVE DIAGNOSIS:  right abdominal wall melanoma  PROCEDURE:  Procedure(s): Wide MELANOMA EXCISION Right Abdominal wall WITH SENTINEL LYMPH NODE mapping Right Axilla (Right)  SURGEON:  Surgeon(s) and Role:    * Robyne Askew, MD - Primary  PHYSICIAN ASSISTANT:   ASSISTANTS: none   ANESTHESIA:   general  EBL:     BLOOD ADMINISTERED:none  DRAINS: none   LOCAL MEDICATIONS USED:  MARCAINE     SPECIMEN:  Source of Specimen:  wide excision abdominal wall melanoma and sentinel node  DISPOSITION OF SPECIMEN:  PATHOLOGY  COUNTS:  YES  TOURNIQUET:  * No tourniquets in log *  DICTATION: .Dragon Dictation After informed consent was obtained the patient was brought to the operating room and placed in the supine position on the operating room table. After adequate induction of general anesthesia the patient's abdomen and right axilla were prepped with ChloraPrep, allowed to dry, and draped in usual sterile manner. Earlier in the day the patient underwent injection of 1 mCi of technetium sulfur colloid in the area around the melanoma on the right upper abdominal wall. At this point, 2 cc methylene blue and 3 cc of injectable saline were also injected in the area around the previous melanoma site. The area was massaged for several minutes. A neoprobe was then used to identify a hot spot in the right axilla. A small transverse incision was made overlying the hot spot with the 15 blade knife. This incision was carried through the skin and subcutaneous tissue sharply with electrocautery until the axilla was entered. A wheatland retractor was deployed. The neoprobe was used to direct blunt hemostat dissection in the right axilla until a hot blue lymph node was identified. This was excised by combination of sharp and Bovie dissection and then the lymphatics were  clamped with hemostats, divided, and ligated with 3-0 Vicryl ties. This was sent as sentinel node #1. This was probably a cluster of 2 or 3 lymph nodes. No other hot, blue, or palpable lymph nodes were identified in the right axilla. The deep layer the wound was closed with interrupted 3-0 Vicryl stitches. The skin was then closed with interrupted 4 Monocryl subcuticular stitches. The area was infiltrated with quarter percent Marcaine. Attention was then turned to the right upper abdominal wall. A margin of 2 cm around the melanoma was marked and an elliptical incision was made to maintain this margin. This was done with a 15 blade knife. This incision was carried through the skin and subcutaneous tissue sharply with the electrocautery until the fascia of the abdominal wall was encountered. The specimen was removed completely. It was marked with a short stitch superior and a long stitch to the left. Hemostasis was achieved using the Bovie electrocautery. The wound was irrigated with copious amounts of saline and infiltrated with quarter percent Marcaine. The deep layer the wound was closed with 2 layers of interrupted 0 Vicryl stitches. The skin was then closed with interrupted 4-0 Monocryl subcuticular stitches. Dermabond dressings were applied. The patient tolerated the procedure well. At the end of the case all needle sponge and instrument counts are correct. The patient was then awakened and taken to recovery in stable condition.  PLAN OF CARE: Discharge to home after PACU  PATIENT DISPOSITION:  PACU - hemodynamically stable.   Delay start of Pharmacological VTE agent (>24hrs) due to surgical  blood loss or risk of bleeding: not applicable

## 2013-03-02 NOTE — Anesthesia Postprocedure Evaluation (Signed)
  Anesthesia Post-op Note  Patient: Morgan Bell  Procedure(s) Performed: Procedure(s): Wide MELANOMA EXCISION Right Abdominal wall WITH SENTINEL LYMPH NODE mapping Right Axilla (Right)  Patient Location: PACU  Anesthesia Type:General  Level of Consciousness: awake  Airway and Oxygen Therapy: Patient Spontanous Breathing  Post-op Pain: mild  Post-op Assessment: Post-op Vital signs reviewed  Post-op Vital Signs: Reviewed  Complications: No apparent anesthesia complications

## 2013-03-02 NOTE — Preoperative (Signed)
Beta Blockers   Reason not to administer Beta Blockers:Not Applicable 

## 2013-03-02 NOTE — Interval H&P Note (Signed)
History and Physical Interval Note:  03/02/2013 10:14 AM  Morgan Bell  has presented today for surgery, with the diagnosis of melanoma  The various methods of treatment have been discussed with the patient and family. After consideration of risks, benefits and other options for treatment, the patient has consented to  Procedure(s): Wide MELANOMA EXCISION Right Abdominal wall WITH SENTINEL LYMPH NODE mapping Right Axilla (Right) as a surgical intervention .  The patient's history has been reviewed, patient examined, no change in status, stable for surgery.  I have reviewed the patient's chart and labs.  Questions were answered to the patient's satisfaction.     TOTH III,PAUL S

## 2013-03-02 NOTE — H&P (View-Only) (Signed)
Subjective:     Patient ID: Morgan Bell, female   DOB: 02/17/1951, 62 y.o.   MRN: [id number]  HPI We're asked to see the patient in consultation by Dr. Lupton to evaluate her for a melanoma on her abdominal wall. The patient is a 62-year-old white female who has had small moles on her scan for a long time. She recently went for her first check by a dermatologist who biopsied 2 lesions. The one on her left shoulder was benign. The one on her right upper quadrant abdominal wall came back as a melanoma. She states that it was not ulcerated prior to the biopsy. She denied any itching or bleeding. She had not noticed it changed recently but said that it was irregular around the border.  Review of Systems  Constitutional: Negative.   HENT: Negative.   Eyes: Negative.   Respiratory: Negative.   Cardiovascular: Negative.   Gastrointestinal: Negative.   Endocrine: Negative.   Genitourinary: Negative.   Musculoskeletal: Negative.   Skin: Negative.   Allergic/Immunologic: Negative.   Neurological: Negative.   Hematological: Negative.   Psychiatric/Behavioral: Negative.        Objective:   Physical Exam  Constitutional: She is oriented to person, place, and time. She appears well-developed and well-nourished.  HENT:  Head: Normocephalic and atraumatic.  Eyes: Conjunctivae and EOM are normal. Pupils are equal, round, and reactive to light.  Neck: Normal range of motion. Neck supple.  Cardiovascular: Normal rate, regular rhythm and normal heart sounds.   Pulmonary/Chest: Effort normal and breath sounds normal.  There is no palpable axillary or supraclavicular or cervical lymphadenopathy  Abdominal: Soft. Bowel sounds are normal. She exhibits no mass. There is no tenderness.  There is a small eschar in the right upper quadrant of the abdominal wall. There is no palpable groin lymphadenopathy  Musculoskeletal: Normal range of motion.  Lymphadenopathy:    She has no cervical adenopathy.   Neurological: She is alert and oriented to person, place, and time.  Skin: Skin is warm and dry.  Psychiatric: She has a normal mood and affect. Her behavior is normal.       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 Because of this I think she would be best treated with a wide excision of the area as well as a sentinel node mapping. I have discussed with her in detail the risks and benefits of the operation to this as well as some of the technical aspects and she understands and wishes to proceed     Plan:     Plan for initial lymphoscintigraphy to identify the appropriate nodal basin and then will plan for wide excision of the area with sentinel node mapping       

## 2013-03-02 NOTE — Anesthesia Preprocedure Evaluation (Addendum)
Anesthesia Evaluation  Patient identified by MRN, date of birth, ID band Patient awake    Reviewed: Allergy & Precautions, H&P , NPO status , Patient's Chart, lab work & pertinent test results  Airway Mallampati: II      Dental   Pulmonary neg pulmonary ROS,  breath sounds clear to auscultation        Cardiovascular hypertension, Pt. on medications Rhythm:Regular Rate:Normal     Neuro/Psych    GI/Hepatic negative GI ROS, Neg liver ROS,   Endo/Other    Renal/GU negative Renal ROS     Musculoskeletal   Abdominal   Peds  Hematology   Anesthesia Other Findings   Reproductive/Obstetrics                          Anesthesia Physical Anesthesia Plan  ASA: III  Anesthesia Plan: General   Post-op Pain Management:    Induction: Intravenous  Airway Management Planned: Oral ETT  Additional Equipment:   Intra-op Plan:   Post-operative Plan: Extubation in OR  Informed Consent: I have reviewed the patients History and Physical, chart, labs and discussed the procedure including the risks, benefits and alternatives for the proposed anesthesia with the patient or authorized representative who has indicated his/her understanding and acceptance.   Dental advisory given  Plan Discussed with: CRNA, Anesthesiologist and Surgeon  Anesthesia Plan Comments:       Anesthesia Quick Evaluation

## 2013-03-02 NOTE — Transfer of Care (Signed)
Immediate Anesthesia Transfer of Care Note  Patient: Morgan Bell  Procedure(s) Performed: Procedure(s): Wide MELANOMA EXCISION Right Abdominal wall WITH SENTINEL LYMPH NODE mapping Right Axilla (Right)  Patient Location: PACU  Anesthesia Type:General  Level of Consciousness: awake, alert  and oriented  Airway & Oxygen Therapy: Patient Spontanous Breathing and Patient connected to nasal cannula oxygen  Post-op Assessment: Report given to PACU RN and Post -op Vital signs reviewed and stable  Post vital signs: Reviewed and stable  Complications: No apparent anesthesia complications

## 2013-03-03 ENCOUNTER — Encounter (HOSPITAL_COMMUNITY): Payer: Self-pay | Admitting: General Surgery

## 2013-03-10 ENCOUNTER — Telehealth (INDEPENDENT_AMBULATORY_CARE_PROVIDER_SITE_OTHER): Payer: Self-pay

## 2013-03-10 NOTE — Telephone Encounter (Signed)
Let patient know her path results showed no residual melanoma and node was negative

## 2013-03-14 ENCOUNTER — Telehealth (INDEPENDENT_AMBULATORY_CARE_PROVIDER_SITE_OTHER): Payer: Self-pay | Admitting: General Surgery

## 2013-03-14 NOTE — Telephone Encounter (Signed)
Pt called to state there is "some clear seepage" coming from incision now.  Reassured her that this drainage is not out of the ordinary, to wash with soap and water, pat dry and cover with absorbant dressing.  She understands.

## 2013-03-18 ENCOUNTER — Encounter (INDEPENDENT_AMBULATORY_CARE_PROVIDER_SITE_OTHER): Payer: Self-pay | Admitting: General Surgery

## 2013-03-18 ENCOUNTER — Ambulatory Visit (INDEPENDENT_AMBULATORY_CARE_PROVIDER_SITE_OTHER): Payer: PRIVATE HEALTH INSURANCE | Admitting: General Surgery

## 2013-03-18 VITALS — BP 124/86 | HR 84 | Temp 97.3°F | Resp 16 | Ht 62.5 in | Wt 189.8 lb

## 2013-03-18 DIAGNOSIS — C4359 Malignant melanoma of other part of trunk: Secondary | ICD-10-CM

## 2013-03-18 MED ORDER — DOXYCYCLINE HYCLATE 100 MG PO TABS: 100.0000 mg | ORAL_TABLET | Freq: Two times a day (BID) | ORAL | Status: DC

## 2013-03-18 NOTE — Patient Instructions (Signed)
Continue to shower Cover area with antibiotic ointment and gauze Doxycycline for 7 days

## 2013-04-04 ENCOUNTER — Encounter (INDEPENDENT_AMBULATORY_CARE_PROVIDER_SITE_OTHER): Payer: Self-pay | Admitting: General Surgery

## 2013-04-04 ENCOUNTER — Ambulatory Visit (INDEPENDENT_AMBULATORY_CARE_PROVIDER_SITE_OTHER): Payer: PRIVATE HEALTH INSURANCE | Admitting: General Surgery

## 2013-04-04 VITALS — BP 130/82 | HR 80 | Resp 16 | Ht 62.5 in | Wt 188.0 lb

## 2013-04-04 DIAGNOSIS — C4359 Malignant melanoma of other part of trunk: Secondary | ICD-10-CM

## 2013-04-04 NOTE — Progress Notes (Signed)
Subjective:     Patient ID: Morgan Bell, female   DOB: 27-Oct-1950, 62 y.o.   MRN: [id number]  HPI The patient is a 62 year old white female who is one month status post wide excision of a melanoma from her abdominal wall. Her wound has completely healed at this point. She only has some occasional soreness associated with it.  Review of Systems     Objective:   Physical Exam On exam her abdominal incision an axillary incision have healed nicely with no sign of infection.    Assessment:     The patient is one month status post wide excision of a melanoma from her abdominal wall     Plan:     At this point I think she can begin returning to her normal activities without restrictions. We will refer her to medical oncology to see if there is any other adjuvant therapy this should be done. Otherwise we will see her back in about 3 months

## 2013-04-04 NOTE — Progress Notes (Signed)
Subjective:     Patient ID: Morgan Bell, female   DOB: April 03, 1951, 62 y.o.   MRN: [id number]  HPI The patient is a 62 year old white female who is about 2 weeks status post wide excision of a melanoma from her epigastric region of her abdominal wall and negative sentinel node mapping. She has done well since the surgery. She has some mild soreness associated with the incision. She has had a little bit of skin separation along the midportion of the incision. There is no sign of infection or significant seroma.  Review of Systems     Objective:   Physical Exam On exam her abdomen is soft and nontender. Most of the incision has healed well. There is some slight skin separation along the midportion of the incision    Assessment:     The patient is 2 weeks status post wide excision of melanoma from her abdominal wall     Plan:     At this point I would like her to keep the incision covered with an antibiotic ointment and gauze. We will plan to see her back in a couple weeks to check her progress. Her pathology showed no residual disease and her lymph node was negative. We will plan to refer her back to the medical oncologist to see if there is any other adjuvant therapy she will need

## 2013-04-04 NOTE — Addendum Note (Signed)
Addended by: Caleen Essex III on: 04/04/2013 02:49 PM   Modules accepted: Orders

## 2013-04-05 ENCOUNTER — Telehealth: Payer: Self-pay | Admitting: Oncology

## 2013-04-05 NOTE — Telephone Encounter (Signed)
S/W PT IN RE NP APPT 07/09 @ 3 W/DR. HA REFERRING DR. TOTH DX- MELANOMA OF TRUNK WELCOME PACKET MAILED.   REFERRING OFFICE AWARE OF APPT.

## 2013-04-06 ENCOUNTER — Telehealth: Payer: Self-pay | Admitting: Oncology

## 2013-04-06 NOTE — Telephone Encounter (Signed)
C/D 04/06/13 for appt. 04/20/13

## 2013-04-20 ENCOUNTER — Ambulatory Visit (HOSPITAL_BASED_OUTPATIENT_CLINIC_OR_DEPARTMENT_OTHER): Payer: No Typology Code available for payment source

## 2013-04-20 ENCOUNTER — Encounter: Payer: Self-pay | Admitting: Oncology

## 2013-04-20 ENCOUNTER — Ambulatory Visit: Payer: No Typology Code available for payment source | Admitting: Oncology

## 2013-04-20 ENCOUNTER — Other Ambulatory Visit: Payer: No Typology Code available for payment source | Admitting: Lab

## 2013-04-20 ENCOUNTER — Ambulatory Visit: Payer: No Typology Code available for payment source

## 2013-04-20 ENCOUNTER — Ambulatory Visit (HOSPITAL_BASED_OUTPATIENT_CLINIC_OR_DEPARTMENT_OTHER): Payer: No Typology Code available for payment source | Admitting: Oncology

## 2013-04-20 VITALS — BP 165/95 | HR 103 | Temp 97.9°F | Resp 18 | Ht 62.0 in | Wt 186.1 lb

## 2013-04-20 DIAGNOSIS — M899 Disorder of bone, unspecified: Secondary | ICD-10-CM

## 2013-04-20 DIAGNOSIS — M1990 Unspecified osteoarthritis, unspecified site: Secondary | ICD-10-CM

## 2013-04-20 DIAGNOSIS — M2540 Effusion, unspecified joint: Secondary | ICD-10-CM

## 2013-04-20 DIAGNOSIS — C4359 Malignant melanoma of other part of trunk: Secondary | ICD-10-CM

## 2013-04-20 DIAGNOSIS — R229 Localized swelling, mass and lump, unspecified: Secondary | ICD-10-CM

## 2013-04-20 NOTE — Progress Notes (Signed)
Checked in new patient. No financial issues. I didn't ask is POA/living will. Email/ph/mail for communciation.

## 2013-04-21 NOTE — Progress Notes (Signed)
[facility]  Telephone:([phone] Fax:([phone]) [phone]   MEDICAL ONCOLOGY - INITIAL CONSULATION    Referral MD:  Chevis Pretty, M.D.   Reason for Referral: melanoma.    HPI:  [name] is a 62 year-old woman.  She had freckles all her life. She went to Dr. Terri Bell for a concerning mole on her xyphoid process.  Biopsy showed 0.39mm, melanoma, with low mitotic rate and without ulceration.  She was referred to Dr. Carolynne Bell for wide excision and sentinel node biopsy.  There was no residual melanoma.  Sentinel node biopsy was negative.  She was kindly referred to Med Onc.   [name] presented to the clinic by herself today.  She reported that she had healed well from the surgery.  She denied any erythema, bleeding, palpable nodes.  She has diffuse bone pain, worst in her hands with nodules.    Patient denies fever, anorexia, weight loss, fatigue, headache, visual changes, confusion, drenching night sweats, palpable lymph node swelling, mucositis, odynophagia, dysphagia, nausea vomiting, jaundice, chest pain, palpitation, shortness of breath, dyspnea on exertion, productive cough, gum bleeding, epistaxis, hematemesis, hemoptysis, abdominal pain, abdominal swelling, early satiety, melena, hematochezia, hematuria, skin rash, spontaneous bleeding, heat or cold intolerance, bowel bladder incontinence, back pain, focal motor weakness, paresthesia, depression.      Past Medical History  Diagnosis Date  . Arthritis   . Hypertension   . Melanoma   . Stroke     MINI STROKE     21 YRS AGO   . Headache(784.0)     HX MIGRAINES   :  Past Surgical History  Procedure Laterality Date  . Cesarean section  11/26/1973  . Cesarean section  09/22/1975  . Cesarean section  04/30/1978  . Total hip arthroplasty  03/2011    Right.  . Hemorrhoid surgery  06/1987  . Melanoma excision with sentinel lymph node biopsy Right 03/02/2013    Procedure: Wide MELANOMA EXCISION Right Abdominal  wall WITH SENTINEL LYMPH NODE mapping Right Axilla;  Surgeon: Morgan Askew, MD;  Location: [facility];  Service: General;  Laterality: Right;  :  Current Outpatient Prescriptions  Medication Sig Dispense Refill  . aspirin EC 325 MG tablet Take 325 mg by mouth daily.      . enalapril (VASOTEC) 20 MG tablet Take 20 mg by mouth 2 (two) times daily.      . furosemide (LASIX) 40 MG tablet Take 40 mg by mouth daily as needed (fluid retention).       . gabapentin (NEURONTIN) 300 MG capsule Take 300 mg by mouth 2 (two) times daily.      Marland Kitchen HYDROcodone-acetaminophen (NORCO/VICODIN) 5-325 MG per tablet Take 1-2 tablets by mouth every 4 (four) hours as needed for pain.  50 tablet  1  . loratadine (CLARITIN) 10 MG tablet Take 10 mg by mouth daily.      . Multiple Vitamin (MULTIVITAMIN WITH MINERALS) TABS Take 1 tablet by mouth daily.      Marland Kitchen triamterene-hydrochlorothiazide (MAXZIDE-25) 37.5-25 MG per tablet Take 1 tablet by mouth daily.       No current facility-administered medications for this visit.     Allergies  Allergen Reactions  . Sulfa Antibiotics     SULFA DRUGS  :  Family History  Problem Relation Age of Onset  . Alzheimer's disease Mother   . Heart disease Father   :  History   Social History  . Marital Status: Divorced  Spouse Name: N/A    Number of Children: 3  . Years of Education: N/A   Occupational History  .      Diplomatic Services operational officer; raises service dogs.    Social History Main Topics  . Smoking status: Never Smoker   . Smokeless tobacco: Never Used  . Alcohol Use: Yes     Comment: OCC WINE   . Drug Use: No  . Sexually Active: Not on file   Other Topics Concern  . Not on file   Social History Narrative  . No narrative on file  :   Exam: ECOG 1 due to diffuse joint pain.   General:  well-nourished in no acute distress.  Eyes:  no scleral icterus.  ENT:  There were no oropharyngeal lesions.  Neck was without thyromegaly.  Lymphatics:  Negative cervical,  supraclavicular or axillary adenopathy.  Respiratory: lungs were clear bilaterally without wheezing or crackles.  Cardiovascular:  Regular rate and rhythm, S1/S2, without murmur, rub or gallop.  There was no pedal edema.  GI:  abdomen was soft, flat, nontender, nondistended, without organomegaly.  Muscoloskeletal:  no spinal tenderness of palpation of vertebral spine.  She has marked ulnar deviation from both hands.  There were what appeared like rheumatoid nodules on her wrists.  Skin exam was without echymosis, petichae.  Complete skin exam showed several nevi without concerning characteristics.   Neuro exam was nonfocal.  Patient was able to get on and off exam table without assistance.  Gait was normal.  Patient was alert and oriented.  Attention was good.   Language was appropriate.  Mood was normal without depression.  Speech was not pressured.  Thought content was not tangential.     Lab Results  Component Value Date   WBC 11.1* 02/28/2013   HGB 11.9* 02/28/2013   HCT 37.3 02/28/2013   PLT 314 02/28/2013   GLUCOSE 103* 02/28/2013   ALT 23 02/28/2013   AST 34 02/28/2013   NA 139 02/28/2013   K 3.0* 02/28/2013   CL 97 02/28/2013   CREATININE 1.21* 02/28/2013   BUN 26* 02/28/2013   CO2 25 02/28/2013    Assessment and Plan:   1.  Stage I melanoma with complete excision and negative sentinel lymph node biopsy.  Her chance of cure with resection alone is high.  There is no indication for adjuvant chemo.   2.  Joint swellings and nodules:  Concerning for rheumatoid arthritis.  I referred her to Rheumatology.    3.  Follow up:  As she has already has visit every 6 months with Dermatology, I discharged her from clinic here.  Our service is available in the future if the need arises.    The length of time of the face-to-face encounter was 30 minutes. More than 50% of time was spent counseling and coordination of care.     Thank you for this referral.

## 2013-04-22 ENCOUNTER — Telehealth: Payer: Self-pay | Admitting: Oncology

## 2013-05-13 ENCOUNTER — Telehealth: Payer: Self-pay | Admitting: Oncology

## 2013-06-17 ENCOUNTER — Ambulatory Visit (INDEPENDENT_AMBULATORY_CARE_PROVIDER_SITE_OTHER): Payer: PRIVATE HEALTH INSURANCE | Admitting: General Surgery

## 2013-06-17 ENCOUNTER — Other Ambulatory Visit: Payer: Self-pay | Admitting: Internal Medicine

## 2013-06-20 NOTE — Telephone Encounter (Signed)
Rx was sent to pharmacy electronically. 

## 2013-07-12 ENCOUNTER — Encounter (INDEPENDENT_AMBULATORY_CARE_PROVIDER_SITE_OTHER): Payer: Self-pay

## 2013-10-10 ENCOUNTER — Other Ambulatory Visit: Payer: Self-pay | Admitting: Internal Medicine

## 2013-10-10 DIAGNOSIS — Z79899 Other long term (current) drug therapy: Secondary | ICD-10-CM

## 2013-10-12 ENCOUNTER — Other Ambulatory Visit: Payer: Self-pay | Admitting: *Deleted

## 2013-10-12 DIAGNOSIS — Z79899 Other long term (current) drug therapy: Secondary | ICD-10-CM

## 2013-10-12 NOTE — Telephone Encounter (Signed)
Pt still have not received her medicine please call it  in today.

## 2013-10-12 NOTE — Telephone Encounter (Signed)
?  Interaction: Increased K+ w/ enalapril and Maxide.    Dr. Rennis Golden notified and advised BMET before refill.  Call to pt and verified x 2.  Pt informed and upset.  Stated she will be 21 in February and will not get labs done before then.  Stated it will be much cheaper for her after her 63th birthday and she has been through a lot.  Pt informed of rationale for labs and stated her K+ was low in May and they put her on K+.  Stated she has been taking it since then.  Pt informed that she is still at risk for increased K+ b/c of the two meds and her taking K+.  Pt verbalized understanding.  Pt informed one 30-day refill will be sent as her appt is due in February and she would need labs then anyway.  Pt verbalized understanding and agreed w/ plan.  Refill(s) sent to pharmacy for enough to last until appt on 2.6.15 at 3:30pm w/ Dr. Rennis Golden.

## 2013-10-12 NOTE — Telephone Encounter (Signed)
Pt called still have not received her medicine.Would you please call it in today.

## 2013-11-09 ENCOUNTER — Encounter: Payer: Self-pay | Admitting: *Deleted

## 2013-11-17 ENCOUNTER — Encounter: Payer: Self-pay | Admitting: Internal Medicine

## 2013-11-18 ENCOUNTER — Ambulatory Visit: Payer: No Typology Code available for payment source | Admitting: Internal Medicine

## 2013-12-09 ENCOUNTER — Other Ambulatory Visit: Payer: Self-pay | Admitting: Internal Medicine

## 2013-12-12 ENCOUNTER — Ambulatory Visit: Payer: No Typology Code available for payment source | Admitting: Internal Medicine

## 2013-12-12 NOTE — Telephone Encounter (Signed)
E-SENT RX

## 2014-01-10 ENCOUNTER — Other Ambulatory Visit: Payer: Self-pay | Admitting: Internal Medicine

## 2014-01-16 ENCOUNTER — Ambulatory Visit (INDEPENDENT_AMBULATORY_CARE_PROVIDER_SITE_OTHER): Payer: Medicare Other | Admitting: Family Medicine

## 2014-01-16 VITALS — BP 136/88 | HR 98 | Temp 98.1°F | Ht 62.0 in | Wt 181.2 lb

## 2014-01-16 DIAGNOSIS — H9209 Otalgia, unspecified ear: Secondary | ICD-10-CM

## 2014-01-16 DIAGNOSIS — J019 Acute sinusitis, unspecified: Secondary | ICD-10-CM

## 2014-01-16 DIAGNOSIS — R42 Dizziness and giddiness: Secondary | ICD-10-CM

## 2014-01-16 LAB — POCT CBC
Granulocyte percent: 75.2 %G (ref 37–80)
HCT, POC: 32.7 % — AB (ref 37.7–47.9)
Hemoglobin: 10.1 g/dL — AB (ref 12.2–16.2)
Lymph, poc: 1.4 (ref 0.6–3.4)
MCH, POC: 28.8 pg (ref 27–31.2)
MCHC: 30.9 g/dL — AB (ref 31.8–35.4)
MCV: 93.1 fL (ref 80–97)
MID (cbc): 0.7 (ref 0–0.9)
MPV: 9.4 fL (ref 0–99.8)
POC Granulocyte: 6.2 (ref 2–6.9)
POC LYMPH PERCENT: 16.7 %L (ref 10–50)
POC MID %: 8.1 %M (ref 0–12)
Platelet Count, POC: 245 10*3/uL (ref 142–424)
RBC: 3.51 M/uL — AB (ref 4.04–5.48)
RDW, POC: 18.3 %
WBC: 8.3 10*3/uL (ref 4.6–10.2)

## 2014-01-16 MED ORDER — MECLIZINE HCL 25 MG PO TABS: 25.0000 mg | ORAL_TABLET | Freq: Three times a day (TID) | ORAL | Status: DC | PRN

## 2014-01-16 MED ORDER — TRIAMCINOLONE ACETONIDE 55 MCG/ACT NA AERO: 2.0000 | INHALATION_SPRAY | Freq: Every day | NASAL | Status: DC

## 2014-01-16 MED ORDER — AMOXICILLIN-POT CLAVULANATE 875-125 MG PO TABS: 1.0000 | ORAL_TABLET | Freq: Two times a day (BID) | ORAL | Status: DC

## 2014-01-16 NOTE — Telephone Encounter (Signed)
Rx was sent to pharmacy electronically. 

## 2014-01-16 NOTE — Patient Instructions (Signed)
Vertigo Vertigo means you feel like you or your surroundings are moving when they are not. Vertigo can be dangerous if it occurs when you are at work, driving, or performing difficult activities.  CAUSES  Vertigo occurs when there is a conflict of signals sent to your brain from the visual and sensory systems in your body. There are many different causes of vertigo, including:  Infections, especially in the inner ear.  A bad reaction to a drug or misuse of alcohol and medicines.  Withdrawal from drugs or alcohol.  Rapidly changing positions, such as lying down or rolling over in bed.  A migraine headache.  Decreased blood flow to the brain.  Increased pressure in the brain from a head injury, infection, tumor, or bleeding. SYMPTOMS  You may feel as though the world is spinning around or you are falling to the ground. Because your balance is upset, vertigo can cause nausea and vomiting. You may have involuntary eye movements (nystagmus). DIAGNOSIS  Vertigo is usually diagnosed by physical exam. If the cause of your vertigo is unknown, your caregiver may perform imaging tests, such as an MRI scan (magnetic resonance imaging). TREATMENT  Most cases of vertigo resolve on their own, without treatment. Depending on the cause, your caregiver may prescribe certain medicines. If your vertigo is related to body position issues, your caregiver may recommend movements or procedures to correct the problem. In rare cases, if your vertigo is caused by certain inner ear problems, you may need surgery. HOME CARE INSTRUCTIONS   Follow your caregiver's instructions.  Avoid driving.  Avoid operating heavy machinery.  Avoid performing any tasks that would be dangerous to you or others during a vertigo episode.  Tell your caregiver if you notice that certain medicines seem to be causing your vertigo. Some of the medicines used to treat vertigo episodes can actually make them worse in some people. SEEK  IMMEDIATE MEDICAL CARE IF:   Your medicines do not relieve your vertigo or are making it worse.  You develop problems with talking, walking, weakness, or using your arms, hands, or legs.  You develop severe headaches.  Your nausea or vomiting continues or gets worse.  You develop visual changes.  A family member notices behavioral changes.  Your condition gets worse. MAKE SURE YOU:  Understand these instructions.  Will watch your condition.  Will get help right away if you are not doing well or get worse. Document Released: 07/09/2005 Document Revised: 12/22/2011 Document Reviewed: 04/17/2011 [employer] Patient Information 2014 [employer].  

## 2014-01-16 NOTE — Progress Notes (Signed)
Chief Complaint:  Chief Complaint  Patient presents with  . Dizziness    started after apllying a heating pad to right ear. C/O room spinning that lasted about 15 minutes & hppening off & on. gets nauseated off & on  . Facial Pain    C/O ear pain, sinus pressure, & sore throat. Tried OTC saline solution    HPI: Morgan Bell is a 63 y.o. female who is here for  1 week  Hx of sinus pain and frontal HA, with ear pain, PND and also scratchy throat. She does not know if leaving her hair wet after wahing it last night has contributed to her worsening  ear pain. She has tried a heating pad which made it worse. Additionally about the same time these sxs started she was riding in the car and  Started having vertigo, does not matter if she is sitting or standing, however worse with head movement. She has had dizziness today , 7:30 am, then stopped at 10 oclock, then she started having them again at 5:30 pm. She has not had it since. She denis any CP, SOB,  syncope. Has had some nauseau, denies  ruinary sxs or back pain. Denies facial weakness, numbness, tingling, slurred speech or stroke like sxs. She has had vertigo in the past with an ear infection. Feels similar, She has not been driving. Deneis light sentivity or double vision.  Deneis fevers, chills, has had PND cough . Denies allergies, has been on ACEI for a long time without cough. Lives alone. She is on methotrexate for RA  Past Medical History  Diagnosis Date  . Arthritis   . Hypertension   . Melanoma   . Stroke     MINI STROKE     21 YRS AGO   . Headache(784.0)     HX MIGRAINES   . Obesity    Past Surgical History  Procedure Laterality Date  . Cesarean section  11/26/1973  . Cesarean section  09/22/1975  . Cesarean section  04/30/1978  . Total hip arthroplasty Right 03/2011  . Hemorrhoid surgery  06/1987  . Melanoma excision with sentinel lymph node biopsy Right 03/02/2013    Procedure: Wide MELANOMA EXCISION Right  Abdominal wall WITH SENTINEL LYMPH NODE mapping Right Axilla;  Surgeon: Luella Cook III, MD;  Location: [facility];  Service: General;  Laterality: Right;  . Nm myocar perf wall motion  2003    negative Bruce protocol exercise stress test with no evidence of perfusion abnormality, EF 66%   History   Social History  . Marital Status: Divorced    Spouse Name: N/A    Number of Children: 3  . Years of Education: 12   Occupational History  .      Network engineer; raises service dogs.    Social History Main Topics  . Smoking status: Never Smoker   . Smokeless tobacco: Never Used  . Alcohol Use: No  . Drug Use: No  . Sexual Activity: None   Other Topics Concern  . None   Social History Narrative  . None   Family History  Problem Relation Age of Onset  . Alzheimer's disease Mother   . Heart disease Father     MI, died at 71  . Alzheimer's disease Maternal Grandmother   . Cancer Maternal Grandfather   . Heart attack Paternal Grandfather   . Tuberculosis Sister    Allergies  Allergen Reactions  . Sulfa Antibiotics  SULFA DRUGS   Prior to Admission medications   Medication Sig Start Date End Date Taking? Authorizing Provider  amLODipine (NORVASC) 10 MG tablet Take 5 mg by mouth 2 (two) times daily.   Yes Historical Provider, MD  aspirin EC 325 MG tablet Take 325 mg by mouth daily.   Yes Historical Provider, MD  enalapril (VASOTEC) 20 MG tablet TAKE 1 TABLET TWICE A DAY   Yes [name], MD  folic acid (FOLVITE) 161 MCG tablet Take 400 mcg by mouth daily.   Yes Historical Provider, MD  furosemide (LASIX) 40 MG tablet Take 40 mg by mouth daily as needed (fluid retention).  12/02/12  Yes Historical Provider, MD  loratadine (CLARITIN) 10 MG tablet Take 10 mg by mouth daily.   Yes Historical Provider, MD  meloxicam (MOBIC) 15 MG tablet Take 15 mg by mouth daily.   Yes Historical Provider, MD  methotrexate 2.5 MG tablet Take 15 mg by mouth once a week.   Yes Historical Provider, MD    Multiple Vitamin (MULTIVITAMIN WITH MINERALS) TABS Take 1 tablet by mouth daily.   Yes Historical Provider, MD  potassium chloride SA (K-DUR,KLOR-CON) 20 MEQ tablet Take 20 mEq by mouth daily as needed.   Yes Historical Provider, MD  triamterene-hydrochlorothiazide (MAXZIDE-25) 37.5-25 MG per tablet TAKE 1 TABLET DAILY.   Yes [name], MD     ROS: The patient denies fevers, chills, night sweats, unintentional weight loss, chest pain, palpitations, wheezing, dyspnea on exertion,  vomiting, abdominal pain, dysuria, hematuria, melena, numbness, weakness, or tingling.   All other systems have been reviewed and were otherwise negative with the exception of those mentioned in the HPI and as above.    PHYSICAL EXAM: Filed Vitals:   01/16/14 2105  BP: 136/88  Pulse: 98  Temp:    Filed Vitals:   01/16/14 1927  Height: 5\' 2"  (1.575 m)  Weight: 181 lb 4 oz (82.214 kg)   Body mass index is 33.14 kg/(m^2).  General: Alert, no acute distress HEENT:  Normocephalic, atraumatic, oropharynx patent. EOMI, PERRLA. Fundo exam normal . Tm nl, + sinus tenderness max and frontal bialterally,+ PND Cardiovascular:  Regular rate and rhythm, no rubs murmurs or gallops.  No Carotid bruits, radial pulse intact. No pedal edema.  Respiratory: Clear to auscultation bilaterally.  No wheezes, rales, or rhonchi.  No cyanosis, no use of accessory musculature GI: No organomegaly, abdomen is soft and non-tender, positive bowel sounds.  No masses. Skin: No rashes. Neurologic: Facial musculature symmetric. Psychiatric: Patient is appropriate throughout our interaction. Lymphatic: No cervical lymphadenopathy Musculoskeletal: Gait intact. No CVA tenderness 5/5 str, sensation intact   LABS: Results for orders placed in visit on 01/16/14  POCT CBC      Result Value Ref Range   WBC 8.3  4.6 - 10.2 K/uL   Lymph, poc 1.4  0.6 - 3.4   POC LYMPH PERCENT 16.7  10 - 50 %L   MID (cbc) 0.7  0 - 0.9   POC MID % 8.1   0 - 12 %M   POC Granulocyte 6.2  2 - 6.9   Granulocyte percent 75.2  37 - 80 %G   RBC 3.51 (*) 4.04 - 5.48 M/uL   Hemoglobin 10.1 (*) 12.2 - 16.2 g/dL   HCT, POC 32.7 (*) 37.7 - 47.9 %   MCV 93.1  80 - 97 fL   MCH, POC 28.8  27 - 31.2 pg   MCHC 30.9 (*) 31.8 - 35.4  g/dL   RDW, POC 18.3     Platelet Count, POC 245  142 - 424 K/uL   MPV 9.4  0 - 99.8 fL     EKG/XRAY:   Primary read interpreted by Dr. Marin Comment at [facility].   ASSESSMENT/PLAN: Encounter Diagnoses  Name Primary?  . Dizziness and giddiness Yes  . Sinusitis, acute   . Vertigo    Orthostatics is normal, CBC is stable HGb nl range is 10-11 She cannot give urine test since already urinated,  This is most likely just sinus related, sinus sxs with vertigo Rx augmentin, meclizine and also nasocort Try trial of meclizine and also nascort otc first, if no improvement then may use augmentin but advised her to call her rheumatologist Dr Amil Amen to see if augmentin ok with methotrexate F/u prn  Gross sideeffects, risk and benefits, and alternatives of medications d/w patient. Patient is aware that all medications have potential sideeffects and we are unable to predict every sideeffect or drug-drug interaction that may occur.  LE, [place], DO 01/16/2014 9:16 PM

## 2014-01-17 LAB — COMPREHENSIVE METABOLIC PANEL
ALT: 28 U/L (ref 0–35)
AST: 34 U/L (ref 0–37)
Albumin: 4.1 g/dL (ref 3.5–5.2)
Alkaline Phosphatase: 80 U/L (ref 39–117)
BUN: 15 mg/dL (ref 6–23)
CO2: 24 mEq/L (ref 19–32)
Calcium: 9.8 mg/dL (ref 8.4–10.5)
Chloride: 104 mEq/L (ref 96–112)
Creat: 1.19 mg/dL — ABNORMAL HIGH (ref 0.50–1.10)
Glucose, Bld: 105 mg/dL — ABNORMAL HIGH (ref 70–99)
Potassium: 3.4 mEq/L — ABNORMAL LOW (ref 3.5–5.3)
Sodium: 139 mEq/L (ref 135–145)
Total Bilirubin: 0.5 mg/dL (ref 0.2–1.2)
Total Protein: 6.4 g/dL (ref 6.0–8.3)

## 2014-01-19 ENCOUNTER — Telehealth: Payer: Self-pay | Admitting: Family Medicine

## 2014-01-19 NOTE — Telephone Encounter (Signed)
Attempted to but Unable to leave message about labs

## 2014-02-02 ENCOUNTER — Telehealth: Payer: Self-pay | Admitting: Family Medicine

## 2014-02-02 NOTE — Telephone Encounter (Signed)
Unable to leave mssg, will send letter

## 2014-02-03 ENCOUNTER — Encounter: Payer: Self-pay | Admitting: Family Medicine

## 2014-02-07 ENCOUNTER — Other Ambulatory Visit: Payer: Self-pay | Admitting: Internal Medicine

## 2014-02-07 ENCOUNTER — Other Ambulatory Visit: Payer: Self-pay | Admitting: *Deleted

## 2014-02-07 MED ORDER — FUROSEMIDE 40 MG PO TABS: 40.0000 mg | ORAL_TABLET | Freq: Every day | ORAL | Status: DC | PRN

## 2014-02-07 NOTE — Telephone Encounter (Signed)
Rx was sent to pharmacy electronically. 

## 2014-02-17 ENCOUNTER — Ambulatory Visit: Payer: No Typology Code available for payment source | Admitting: Internal Medicine

## 2014-03-13 ENCOUNTER — Other Ambulatory Visit: Payer: Self-pay | Admitting: Internal Medicine

## 2014-03-13 NOTE — Telephone Encounter (Signed)
Rx was sent to pharmacy electronically. 

## 2014-04-10 ENCOUNTER — Encounter: Payer: Self-pay | Admitting: Internal Medicine

## 2014-04-10 ENCOUNTER — Ambulatory Visit (INDEPENDENT_AMBULATORY_CARE_PROVIDER_SITE_OTHER): Payer: Medicare Other | Admitting: Internal Medicine

## 2014-04-10 VITALS — BP 124/60 | HR 94 | Ht 62.0 in | Wt 176.7 lb

## 2014-04-10 DIAGNOSIS — R9431 Abnormal electrocardiogram [ECG] [EKG]: Secondary | ICD-10-CM

## 2014-04-10 DIAGNOSIS — I10 Essential (primary) hypertension: Secondary | ICD-10-CM

## 2014-04-10 NOTE — Progress Notes (Signed)
OFFICE NOTE  Chief Complaint:  Routine follow-up  Primary Care Physician: Morgan Silversmith, NP  HPI:  Morgan Bell is a pleasant 63 year old female appears that although Dr. Rex Bell. His history of hypertension and obesity. She had hip replacement 2012 and continues to suffer from arthritis which is pretty disabling. She is not particularly active. Her blood pressure has been uncontrolled as she was not taking her Norvasc however she is restarted that and her blood pressures are better. She denies any chest pain or shortness of breath. EKG in the office today does show some high lateral T wave inversions which have seemingly progressed over the past several years. Despite that she is not symptomatic. It is questionable how active she is though.  PMHx:  Past Medical History  Diagnosis Date  . Arthritis   . Hypertension   . Melanoma   . Stroke     MINI STROKE     21 YRS AGO   . Headache(784.0)     HX MIGRAINES   . Obesity     Past Surgical History  Procedure Laterality Date  . Cesarean section  11/26/1973  . Cesarean section  09/22/1975  . Cesarean section  04/30/1978  . Total hip arthroplasty Right 03/2011  . Hemorrhoid surgery  06/1987  . Melanoma excision with sentinel lymph node biopsy Right 03/02/2013    Procedure: Wide MELANOMA EXCISION Right Abdominal wall WITH SENTINEL LYMPH NODE mapping Right Axilla;  Surgeon: Morgan Cook III, MD;  Location: [facility];  Service: General;  Laterality: Right;  . Nm myocar perf wall motion  2003    negative Bruce protocol exercise stress test with no evidence of perfusion abnormality, EF 66%    FAMHx:  Family History  Problem Relation Age of Onset  . Alzheimer's disease Mother   . Heart disease Father     MI, died at 15  . Alzheimer's disease Maternal Grandmother   . Cancer Maternal Grandfather   . Heart attack Paternal Grandfather   . Tuberculosis Sister     SOCHx:   reports that she has never smoked. She has never used  smokeless tobacco. She reports that she does not drink alcohol or use illicit drugs.  ALLERGIES:  Allergies  Allergen Reactions  . Sulfa Antibiotics     SULFA DRUGS    ROS: A comprehensive review of systems was negative except for: Musculoskeletal: positive for arthralgias  HOME MEDS: Current Outpatient Prescriptions  Medication Sig Dispense Refill  . amLODipine (NORVASC) 10 MG tablet Take 5 mg by mouth 2 (two) times daily.      Marland Kitchen aspirin EC 325 MG tablet Take 325 mg by mouth daily.      . enalapril (VASOTEC) 20 MG tablet TAKE 1 TABLET (20 MG TOTAL) BY MOUTH 2 (TWO) TIMES DAILY. <PLEASE MAKE APPOINTMENT>  56 tablet  0  . folic acid (FOLVITE) 811 MCG tablet Take 400 mcg by mouth daily.      . furosemide (LASIX) 40 MG tablet TAKE 1 TABLET EVERY DAY AS NEEDED FOR FLUID  28 tablet  0  . loratadine (CLARITIN) 10 MG tablet Take 10 mg by mouth daily.      . meloxicam (MOBIC) 15 MG tablet Take 15 mg by mouth daily.      . methotrexate 2.5 MG tablet Take 15 mg by mouth once a week.      . Multiple Vitamin (MULTIVITAMIN WITH MINERALS) TABS Take 1 tablet by mouth daily.      Marland Kitchen  potassium chloride SA (K-DUR,KLOR-CON) 20 MEQ tablet Take 20 mEq by mouth daily as needed.      . triamterene-hydrochlorothiazide (MAXZIDE-25) 37.5-25 MG per tablet TAKE 1 TABLET BY MOUTH DAILY. <PLEASE MAKE APPOINTMENT>  28 tablet  0   No current facility-administered medications for this visit.    LABS/IMAGING: No results found for this or any previous visit (from the past 48 hour(s)). No results found.  VITALS: BP 124/60  Pulse 94  Ht 5\' 2"  (1.575 m)  Wt 176 lb 11.2 oz (80.151 kg)  BMI 32.31 kg/m2  EXAM: General appearance: alert and no distress Neck: no carotid bruit and no JVD Lungs: clear to auscultation bilaterally Heart: regular rate and rhythm, S1, S2 normal, no murmur, click, rub or gallop Abdomen: soft, non-tender; bowel sounds normal; no masses,  no organomegaly Extremities: extremities normal,  atraumatic, no cyanosis or edema Pulses: 2+ and symmetric Skin: Skin color, texture, turgor normal. No rashes or lesions Neurologic: Grossly normal Psych: Mood, affect normal  EKG: Normal sinus rhythm with lateral ST and T-wave abnormalities at 94  ASSESSMENT: 1. Hypertension-controlled 2. Abnormal EKG-asymptomatic  PLAN: 1.   Morgan Bell has good blood pressure control now that she's back on her amlodipine.  Her EKG does show some high lateral T wave inversions which have been progressive although she is asymptomatic. Based on her limited mobility due to arthritis I mentioned that we may consider stress testing. Her last test was in 2003. She is asymptomatic and not interested at this time. She is to contact me she should have any more symptoms such as shortness of breath or chest pain. We will plan annual followup or sooner as necessary.  Morgan Casino, MD, [facility] Attending Cardiologist [facility]  Morgan Bell 04/10/2014, 3:49 PM

## 2014-04-10 NOTE — Patient Instructions (Signed)
Continue current medications.  Your physician recommends that you schedule a follow-up appointment in: One year.

## 2014-05-03 ENCOUNTER — Other Ambulatory Visit: Payer: Self-pay | Admitting: Internal Medicine

## 2014-05-03 NOTE — Telephone Encounter (Signed)
Rx was sent to pharmacy electronically. 

## 2014-10-08 ENCOUNTER — Other Ambulatory Visit: Payer: Self-pay | Admitting: Internal Medicine

## 2014-10-09 NOTE — Telephone Encounter (Signed)
Rx refill sent to patient pharmacy   

## 2014-10-12 ENCOUNTER — Other Ambulatory Visit: Payer: Self-pay | Admitting: Internal Medicine

## 2014-10-12 NOTE — Telephone Encounter (Signed)
E sent to pharm 

## 2014-10-27 ENCOUNTER — Encounter: Payer: Self-pay | Admitting: Internal Medicine

## 2014-10-27 ENCOUNTER — Ambulatory Visit (INDEPENDENT_AMBULATORY_CARE_PROVIDER_SITE_OTHER): Payer: Medicare Other | Admitting: Internal Medicine

## 2014-10-27 VITALS — BP 124/86 | HR 88 | Temp 98.5°F | Wt 184.0 lb

## 2014-10-27 DIAGNOSIS — J309 Allergic rhinitis, unspecified: Secondary | ICD-10-CM

## 2014-10-27 NOTE — Patient Instructions (Signed)

## 2014-10-27 NOTE — Progress Notes (Signed)
Pre visit review using our clinic review tool, if applicable. No additional management support is needed unless otherwise documented below in the visit note. 

## 2014-10-27 NOTE — Progress Notes (Signed)
HPI  Pt presents to the clinic today with c/o runny nose, post nasal drip, cough and hoarseness. She reports this started 2-3 weeks ago. She is blowing clear mucous from her nose. Her cough is productive of clear mucous. She denies fever, chills, or shortness of breath. She is taking Claritin daily. She has no history of breathing problems. She has not had sick contacts. She does not smoke.  Review of Systems      Past Medical History  Diagnosis Date  . Arthritis   . Hypertension   . Melanoma   . Stroke     MINI STROKE     21 YRS AGO   . Headache(784.0)     HX MIGRAINES   . Obesity     Family History  Problem Relation Age of Onset  . Alzheimer's disease Mother   . Heart disease Father     MI, died at 28  . Alzheimer's disease Maternal Grandmother   . Cancer Maternal Grandfather   . Heart attack Paternal Grandfather   . Tuberculosis Sister     History   Social History  . Marital Status: Divorced    Spouse Name: N/A    Number of Children: 3  . Years of Education: 12   Occupational History  .      Network engineer; raises service dogs.    Social History Main Topics  . Smoking status: Never Smoker   . Smokeless tobacco: Never Used  . Alcohol Use: No  . Drug Use: No  . Sexual Activity: Not on file   Other Topics Concern  . Not on file   Social History Narrative    Allergies  Allergen Reactions  . Sulfa Antibiotics     SULFA DRUGS     Constitutional:  Denies headache, fatigue, fever or abrupt weight changes.  HEENT:  Positive sore throat. Denies eye redness, eye pain, pressure behind the eyes, facial pain, nasal congestion, ear pain, ringing in the ears, wax buildup, runny nose or bloody nose. Respiratory: Positive cough. Denies difficulty breathing or shortness of breath.  Cardiovascular: Denies chest pain, chest tightness, palpitations or swelling in the hands or feet.   No other specific complaints in a complete review of systems (except as listed in HPI  above).  Objective:   BP 124/86 mmHg  Pulse 88  Temp(Src) 98.5 F (36.9 C) (Oral)  Wt 184 lb (83.462 kg)  SpO2 97% Wt Readings from Last 3 Encounters:  10/27/14 184 lb (83.462 kg)  04/10/14 176 lb 11.2 oz (80.151 kg)  01/16/14 181 lb 4 oz (82.214 kg)     General: Appears her stated age, obese in NAD. HEENT: Head: normal shape and size, no sinus tenderness noted; Eyes: sclera white, no icterus, conjunctiva pink; Ears: Tm's gray and intact, normal light reflex; Nose: mucosa pink and moist, septum midline; Throat/Mouth: + PND. Teeth present, mucosa erythematous and moist, no exudate noted, no lesions or ulcerations noted.  Neck: No lymphadenopathy.   Cardiovascular: Normal rate and rhythm. S1,S2 noted.  No murmur, rubs or gallops noted.  Pulmonary/Chest: Normal effort and positive vesicular breath sounds. No respiratory distress. No wheezes, rales or ronchi noted.      Assessment & Plan:   Allergic Rhinitis:  Get some rest and drink plenty of water Do salt water gargles for the sore throat Switch Claritin to Zyrtec Add Flonase OTC daily x 2 weeks 80 mg Depo IM today  RTC as needed or if symptoms persist.

## 2014-12-14 ENCOUNTER — Ambulatory Visit: Payer: No Typology Code available for payment source | Admitting: Internal Medicine

## 2015-01-01 ENCOUNTER — Telehealth: Payer: Self-pay

## 2015-01-02 ENCOUNTER — Ambulatory Visit (INDEPENDENT_AMBULATORY_CARE_PROVIDER_SITE_OTHER): Payer: Medicare Other | Admitting: Internal Medicine

## 2015-01-02 ENCOUNTER — Encounter: Payer: Self-pay | Admitting: Internal Medicine

## 2015-01-02 VITALS — BP 120/86 | HR 86 | Temp 98.1°F | Wt 182.0 lb

## 2015-01-02 DIAGNOSIS — E669 Obesity, unspecified: Secondary | ICD-10-CM

## 2015-01-02 DIAGNOSIS — I10 Essential (primary) hypertension: Secondary | ICD-10-CM

## 2015-01-02 DIAGNOSIS — R609 Edema, unspecified: Secondary | ICD-10-CM | POA: Diagnosis not present

## 2015-01-02 DIAGNOSIS — Z13220 Encounter for screening for lipoid disorders: Secondary | ICD-10-CM

## 2015-01-02 DIAGNOSIS — M069 Rheumatoid arthritis, unspecified: Secondary | ICD-10-CM | POA: Insufficient documentation

## 2015-01-02 DIAGNOSIS — M1990 Unspecified osteoarthritis, unspecified site: Secondary | ICD-10-CM

## 2015-01-02 DIAGNOSIS — G459 Transient cerebral ischemic attack, unspecified: Secondary | ICD-10-CM | POA: Diagnosis not present

## 2015-01-02 DIAGNOSIS — C4359 Malignant melanoma of other part of trunk: Secondary | ICD-10-CM

## 2015-01-02 LAB — CBC
HCT: 40.2 % (ref 36.0–46.0)
Hemoglobin: 13.6 g/dL (ref 12.0–15.0)
MCHC: 33.8 g/dL (ref 30.0–36.0)
MCV: 88.2 fl (ref 78.0–100.0)
Platelets: 292 10*3/uL (ref 150.0–400.0)
RBC: 4.56 Mil/uL (ref 3.87–5.11)
RDW: 16.4 % — ABNORMAL HIGH (ref 11.5–15.5)
WBC: 9.2 10*3/uL (ref 4.0–10.5)

## 2015-01-02 LAB — COMPREHENSIVE METABOLIC PANEL
ALT: 48 U/L — ABNORMAL HIGH (ref 0–35)
AST: 57 U/L — ABNORMAL HIGH (ref 0–37)
Albumin: 3.9 g/dL (ref 3.5–5.2)
Alkaline Phosphatase: 80 U/L (ref 39–117)
BUN: 20 mg/dL (ref 6–23)
CO2: 30 mEq/L (ref 19–32)
Calcium: 9.8 mg/dL (ref 8.4–10.5)
Chloride: 100 mEq/L (ref 96–112)
Creatinine, Ser: 1.16 mg/dL (ref 0.40–1.20)
GFR: 49.66 mL/min — ABNORMAL LOW (ref >60.00)
Glucose, Bld: 73 mg/dL (ref 70–99)
Potassium: 3 mEq/L — ABNORMAL LOW (ref 3.5–5.1)
Sodium: 136 mEq/L (ref 135–145)
Total Bilirubin: 0.4 mg/dL (ref 0.2–1.2)
Total Protein: 6.9 g/dL (ref 6.0–8.3)

## 2015-01-02 LAB — LIPID PANEL
Cholesterol: 185 mg/dL (ref 0–200)
HDL: 31.6 mg/dL — ABNORMAL LOW (ref >39.00)
NonHDL: 153.4
Total CHOL/HDL Ratio: 6
Triglycerides: 276 mg/dL — ABNORMAL HIGH (ref 0.0–149.0)
VLDL: 55.2 mg/dL — ABNORMAL HIGH (ref 0.0–40.0)

## 2015-01-02 LAB — LDL CHOLESTEROL, DIRECT: Direct LDL: 124 mg/dL

## 2015-01-02 NOTE — Assessment & Plan Note (Signed)
History of in 2014 Continue to follow with dermatology

## 2015-01-02 NOTE — Assessment & Plan Note (Signed)
Well controlled on Norvasc, Enalapril and Maxide Will check CBC and CMET today Encouraged her to consume a low sodium diet

## 2015-01-02 NOTE — Assessment & Plan Note (Signed)
Many years ago No residual effect She is taking ASA daily Will check Lipid Profile and CMET today

## 2015-01-02 NOTE — Patient Instructions (Signed)

## 2015-01-02 NOTE — Progress Notes (Signed)
HPI  Pt presents to the clinic today to establish care and for management of the conditions listed below.   Rheumatoid Arthritis: She is following with Dr. Amil Amen, rheumatology. She is taking Meloxicam and Methotrexate. This regimen does seem to help control her pain.  HTN: She is being followed by Dr. Debara Pickett. She sees him every 6 months. She is taking Amlodipine, Enalapril and Triamterene-HCT. She is also taking a potassium supplement. She denies chest pain, chest tightness or shortness of breath.  History of Melanoma: Had surgery in 2014. She is following with a dermatologist, Dr. Donnal Debar.  TIA: Occurred 24 years ago, no residual effects.  Obesity: BMI of 33. She does not adhere to any specific diet or exercise.  Edema: Occurs occasionally. She reports she will occasionally take Lasix as needed for swelling. When she does take it, she will not take her Triamterene-HCT.  Flu: has not had one in the last few years Tetanus: > 10 years ago Pneumovax: unsure Prevnar: unsure Zostovax: never Mammogram: unsure of last one Pap Smear: unsure of last one Bone Density: unsure Colon Screening: [facility] Screening: Yearly Dentist: Yearly  Past Medical History  Diagnosis Date  . Arthritis   . Hypertension   . Melanoma   . Stroke     MINI STROKE     21 YRS AGO   . Headache(784.0)     HX MIGRAINES   . Obesity     Current Outpatient Prescriptions  Medication Sig Dispense Refill  . amLODipine (NORVASC) 10 MG tablet Take 0.5 tablets (5 mg total) by mouth 2 (two) times daily. 30 tablet 6  . aspirin EC 325 MG tablet Take 325 mg by mouth daily.    . enalapril (VASOTEC) 20 MG tablet Take 1 tablet (20 mg total) by mouth 2 (two) times daily. 60 tablet 11  . folic acid (FOLVITE) 025 MCG tablet Take 400 mcg by mouth daily.    . furosemide (LASIX) 40 MG tablet TAKE 1 TABLET EVERY DAY AS NEEDED FOR FLUID 30 tablet 2  . loratadine (CLARITIN) 10 MG tablet Take 10 mg by mouth daily.    .  meloxicam (MOBIC) 15 MG tablet Take 15 mg by mouth daily.    . methotrexate 2.5 MG tablet Take 15 mg by mouth once a week.    . Misc Natural Products (TURMERIC CURCUMIN) CAPS Take 1 capsule by mouth daily.    . Multiple Vitamin (MULTIVITAMIN WITH MINERALS) TABS Take 1 tablet by mouth daily.    . OMEGA-3 KRILL OIL PO Take 1 capsule by mouth daily.    . potassium chloride SA (K-DUR,KLOR-CON) 20 MEQ tablet Take 20 mEq by mouth daily as needed.    . triamterene-hydrochlorothiazide (MAXZIDE-25) 37.5-25 MG per tablet Take 1 tablet by mouth daily. 30 tablet 11   No current facility-administered medications for this visit.    Allergies  Allergen Reactions  . Sulfa Antibiotics     SULFA DRUGS    Family History  Problem Relation Age of Onset  . Alzheimer's disease Mother   . Heart disease Father     MI, died at 50  . Alzheimer's disease Maternal Grandmother   . Cancer Maternal Grandfather     lung  . Heart attack Paternal Grandfather   . Tuberculosis Sister     History   Social History  . Marital Status: Divorced    Spouse Name: N/A  . Number of Children: 3  . Years of Education: 12   Occupational History  .  Network engineer; raises service dogs.    Social History Main Topics  . Smoking status: Never Smoker   . Smokeless tobacco: Never Used  . Alcohol Use: No  . Drug Use: No  . Sexual Activity: Not on file   Other Topics Concern  . Not on file   Social History Narrative    ROS:  Constitutional: Denies fever, malaise, fatigue, headache or abrupt weight changes.  HEENT: Denies eye pain, eye redness, ear pain, ringing in the ears, wax buildup, runny nose, nasal congestion, bloody nose, or sore throat. Respiratory: Denies difficulty breathing, shortness of breath, cough or sputum production.   Cardiovascular: Denies chest pain, chest tightness, palpitations or swelling in the hands or feet.  Gastrointestinal: Denies abdominal pain, bloating, constipation, diarrhea or blood  in the stool.  Musculoskeletal: Pt reports joint pain and swelling. Denies decrease in range of motion, difficulty with gait, muscle pain.  Skin: Denies redness, rashes, lesions or ulcercations.  Neurological: Denies dizziness, difficulty with memory, difficulty with speech or problems with balance and coordination.   No other specific complaints in a complete review of systems (except as listed in HPI above).  PE: BP 120/86 mmHg  Pulse 86  Temp(Src) 98.1 F (36.7 C) (Oral)  Wt 182 lb (82.555 kg)  SpO2 97%  Wt Readings from Last 3 Encounters:  01/02/15 182 lb (82.555 kg)  10/27/14 184 lb (83.462 kg)  04/10/14 176 lb 11.2 oz (80.151 kg)    General: Appears her stated age, obese in NAD. HEENT: Head: normal shape and size; Eyes: sclera white, no icterus, conjunctiva pink, PERRLA and EOMs intact;  Neck: Neck supple, trachea midline. No masses, lumps or thyromegaly present.  Cardiovascular: Normal rate and rhythm. S1,S2 noted.  No murmur, rubs or gallops noted. No JVD or BLE edema. No carotid bruits noted. Pulmonary/Chest: Normal effort and positive vesicular breath sounds. No respiratory distress. No wheezes, rales or ronchi noted.  Abdomen: Soft and nontender. Normal bowel sounds, no bruits noted. No distention or masses noted. Liver, spleen and kidneys non palpable. Musculoskeletal: Joint deformity noted, mainly in hands. No signs of joint swelling today. Hand grips equal. Neurological: Alert and oriented.  Psychiatric: Mood and affect normal. Behavior is normal. Judgment and thought content normal.     BMET    Component Value Date/Time   NA 139 01/16/2014 2105   K 3.4* 01/16/2014 2105   CL 104 01/16/2014 2105   CO2 24 01/16/2014 2105   GLUCOSE 105* 01/16/2014 2105   BUN 15 01/16/2014 2105   CREATININE 1.19* 01/16/2014 2105   CREATININE 1.21* 02/28/2013 1545   CALCIUM 9.8 01/16/2014 2105   GFRNONAA 46* 02/28/2013 1545   GFRAA 54* 02/28/2013 1545    Lipid Panel  No  results found for: CHOL, TRIG, HDL, CHOLHDL, VLDL, LDLCALC  CBC    Component Value Date/Time   WBC 8.3 01/16/2014 2105   WBC 11.1* 02/28/2013 1545   RBC 3.51* 01/16/2014 2105   RBC 4.55 02/28/2013 1545   HGB 10.1* 01/16/2014 2105   HGB 11.9* 02/28/2013 1545   HCT 32.7* 01/16/2014 2105   HCT 37.3 02/28/2013 1545   PLT 314 02/28/2013 1545   MCV 93.1 01/16/2014 2105   MCV 82.0 02/28/2013 1545   MCH 28.8 01/16/2014 2105   MCH 26.2 02/28/2013 1545   MCHC 30.9* 01/16/2014 2105   MCHC 31.9 02/28/2013 1545   RDW 15.2 02/28/2013 1545   LYMPHSABS 2.3 02/28/2013 1545   MONOABS 1.0 02/28/2013 1545   EOSABS 0.3 02/28/2013  1545   BASOSABS 0.1 02/28/2013 1545    Hgb A1C No results found for: HGBA1C   Assessment and Plan:  Health Maintenance:  She declines flu, tetanus, pneumovax, prevnar or zostovax She declines letting me set her up for mammogram, pap smear or bone density  RTC in 6 months or sooner if needed

## 2015-01-02 NOTE — Assessment & Plan Note (Signed)
She thinks it is RA She is following with rheumatology, advised her to continue Continue MTX and Meloxicam Will check CBC and CMET today

## 2015-01-02 NOTE — Assessment & Plan Note (Signed)
Encouraged her to work on diet and exercise 

## 2015-01-02 NOTE — Assessment & Plan Note (Signed)
No edema on exam today Continue prn Lasix Will check CMET today

## 2015-01-02 NOTE — Progress Notes (Signed)
Pre visit review using our clinic review tool, if applicable. No additional management support is needed unless otherwise documented below in the visit note. 

## 2015-01-03 ENCOUNTER — Telehealth: Payer: Self-pay | Admitting: Internal Medicine

## 2015-01-03 NOTE — Telephone Encounter (Signed)
Pt declined flu vaccine

## 2015-01-03 NOTE — Telephone Encounter (Signed)
emmi emailed °

## 2015-01-04 MED ORDER — POTASSIUM CHLORIDE CRYS ER 20 MEQ PO TBCR: 20.0000 meq | EXTENDED_RELEASE_TABLET | Freq: Two times a day (BID) | ORAL | Status: DC

## 2015-01-04 NOTE — Addendum Note (Signed)
Addended by: Lurlean Nanny on: 01/04/2015 05:04 PM   Modules accepted: Orders

## 2015-04-02 ENCOUNTER — Other Ambulatory Visit: Payer: Self-pay | Admitting: Internal Medicine

## 2015-04-18 ENCOUNTER — Telehealth: Payer: Self-pay | Admitting: *Deleted

## 2015-04-18 NOTE — Telephone Encounter (Signed)
Attempted to contact pt; unable to leave message regarding mammogram

## 2015-05-09 ENCOUNTER — Other Ambulatory Visit: Payer: Self-pay | Admitting: Internal Medicine

## 2015-05-16 ENCOUNTER — Other Ambulatory Visit: Payer: Self-pay | Admitting: Internal Medicine

## 2015-05-17 ENCOUNTER — Other Ambulatory Visit: Payer: Self-pay | Admitting: Internal Medicine

## 2015-05-18 NOTE — Telephone Encounter (Signed)
Rx(s) sent to pharmacy electronically.  

## 2015-05-22 NOTE — Telephone Encounter (Signed)
Phone # given to pt ([place]), pt said she will call directly and schedule appt

## 2015-07-06 ENCOUNTER — Ambulatory Visit: Payer: Medicare Other | Admitting: Internal Medicine

## 2015-07-08 ENCOUNTER — Other Ambulatory Visit: Payer: Self-pay | Admitting: Internal Medicine

## 2015-07-13 ENCOUNTER — Other Ambulatory Visit: Payer: Self-pay | Admitting: Internal Medicine

## 2015-07-17 ENCOUNTER — Other Ambulatory Visit: Payer: Self-pay | Admitting: Internal Medicine

## 2015-07-17 NOTE — Telephone Encounter (Signed)
Rx(s) sent to pharmacy electronically. OV 08/03/15

## 2015-08-03 ENCOUNTER — Ambulatory Visit (INDEPENDENT_AMBULATORY_CARE_PROVIDER_SITE_OTHER): Payer: Medicare Other | Admitting: Internal Medicine

## 2015-08-03 VITALS — BP 136/98 | HR 99 | Ht 62.5 in | Wt 193.5 lb

## 2015-08-03 DIAGNOSIS — R609 Edema, unspecified: Secondary | ICD-10-CM

## 2015-08-03 DIAGNOSIS — E669 Obesity, unspecified: Secondary | ICD-10-CM

## 2015-08-03 DIAGNOSIS — M1990 Unspecified osteoarthritis, unspecified site: Secondary | ICD-10-CM

## 2015-08-03 DIAGNOSIS — I10 Essential (primary) hypertension: Secondary | ICD-10-CM | POA: Diagnosis not present

## 2015-08-03 NOTE — Patient Instructions (Signed)
Your physician wants you to follow-up in: 1 year with Dr. Debara Pickett. You will receive a reminder letter in the mail two months in advance. If you don't receive a letter, please call our office to schedule the follow-up appointment.   If you need a refill on your cardiac medications before your next appointment, please call your pharmacy.

## 2015-08-05 ENCOUNTER — Encounter: Payer: Self-pay | Admitting: Internal Medicine

## 2015-08-05 NOTE — Progress Notes (Signed)
OFFICE NOTE  Chief Complaint:  Mild shortness of breath and weakness  Primary Care Physician: Webb Silversmith, NP  HPI:  Morgan Bell is a pleasant 64 year old female appears that although Dr. Rex Kras. His history of hypertension and obesity. She had hip replacement 2012 and continues to suffer from arthritis which is pretty disabling. She is not particularly active. Her blood pressure has been uncontrolled as she was not taking her Norvasc however she is restarted that and her blood pressures are better. She denies any chest pain or shortness of breath. EKG in the office today does show some high lateral T wave inversions which have seemingly progressed over the past several years. Despite that she is not symptomatic. It is questionable how active she is though.  I saw Morgan Bell back in the office today. She's recently complained of some mild shortness of breath with exertion but it seems to be fairly stable. She denies any chest pain with this. She does have significant rheumatoid arthritis and is having recent adjustments on her methotrexate dosing. She gets a small amount of swelling in her legs which I think is attributed to Norvasc. Blood pressure is well-controlled today. Initially was 136/98, however recheck the end of the visit was 120/84.  PMHx:  Past Medical History  Diagnosis Date  . Arthritis   . Hypertension   . Melanoma   . Stroke     MINI STROKE     21 YRS AGO   . Headache(784.0)     HX MIGRAINES   . Obesity     Past Surgical History  Procedure Laterality Date  . Cesarean section  11/26/1973  . Cesarean section  09/22/1975  . Cesarean section  04/30/1978  . Total hip arthroplasty Right 03/2011  . Hemorrhoid surgery  06/1987  . Melanoma excision with sentinel lymph node biopsy Right 03/02/2013    Procedure: Wide MELANOMA EXCISION Right Abdominal wall WITH SENTINEL LYMPH NODE mapping Right Axilla;  Surgeon: Luella Cook III, MD;  Location: [facility];  Service:  General;  Laterality: Right;  . Nm myocar perf wall motion  2003    negative Bruce protocol exercise stress test with no evidence of perfusion abnormality, EF 66%    FAMHx:  Family History  Problem Relation Age of Onset  . Alzheimer's disease Mother   . Heart disease Father     MI, died at 6  . Alzheimer's disease Maternal Grandmother   . Cancer Maternal Grandfather     lung  . Heart attack Paternal Grandfather   . Tuberculosis Sister     SOCHx:   reports that she has never smoked. She has never used smokeless tobacco. She reports that she does not drink alcohol or use illicit drugs.  ALLERGIES:  Allergies  Allergen Reactions  . Sulfa Antibiotics     SULFA DRUGS    ROS: A comprehensive review of systems was negative except for: Musculoskeletal: positive for arthralgias  HOME MEDS: Current Outpatient Prescriptions  Medication Sig Dispense Refill  . amLODipine (NORVASC) 10 MG tablet Take 0.5 tablets (5 mg total) by mouth 2 (two) times daily. 30 tablet 6  . aspirin EC 325 MG tablet Take 325 mg by mouth daily.    . enalapril (VASOTEC) 20 MG tablet Take 1 tablet (20 mg total) by mouth 2 (two) times daily. 60 tablet 0  . folic acid (FOLVITE) 893 MCG tablet Take 400 mcg by mouth daily.    . furosemide (LASIX) 40 MG tablet TAKE  1 TABLET EVERY DAY AS NEEDED FOR FLUID 30 tablet 2  . KLOR-CON M20 20 MEQ tablet TAKE 1 TABLET (20 MEQ TOTAL) BY MOUTH 2 (TWO) TIMES DAILY. 60 tablet 0  . loratadine (CLARITIN) 10 MG tablet Take 10 mg by mouth daily.    . meloxicam (MOBIC) 15 MG tablet Take 15 mg by mouth daily.    . methotrexate 2.5 MG tablet Take 15 mg by mouth once a week.    . Misc Natural Products (TURMERIC CURCUMIN) CAPS Take 1 capsule by mouth daily.    . Multiple Vitamin (MULTIVITAMIN WITH MINERALS) TABS Take 1 tablet by mouth daily.    . OMEGA-3 KRILL OIL PO Take 1 capsule by mouth daily.    Marland Kitchen triamterene-hydrochlorothiazide (MAXZIDE-25) 37.5-25 MG per tablet TAKE 1 TABLET BY  MOUTH DAILY. 30 tablet 11   No current facility-administered medications for this visit.    LABS/IMAGING: No results found for this or any previous visit (from the past 48 hour(s)). No results found.  VITALS: BP 136/98 mmHg  Pulse 99  Ht 5' 2.5" (1.588 m)  Wt 193 lb 8 oz (87.771 kg)  BMI 34.81 kg/m2  EXAM: General appearance: alert and no distress Neck: no carotid bruit and no JVD Lungs: clear to auscultation bilaterally Heart: regular rate and rhythm, S1, S2 normal, no murmur, click, rub or gallop Abdomen: soft, non-tender; bowel sounds normal; no masses,  no organomegaly Extremities: extremities normal, atraumatic, no cyanosis or edema Pulses: 2+ and symmetric Skin: Skin color, texture, turgor normal. No rashes or lesions Neurologic: Grossly normal Psych: Mood, affect normal  EKG: Normal sinus rhythm with lateral ST and T-wave abnormalities at 99  ASSESSMENT: 1. Hypertension-controlled 2. Abnormal EKG-asymptomatic 3. Rheumatoid arthritis 4. Leg edema  PLAN: 1.   Morgan Bell has good blood pressure control now that she's back on her amlodipine, but likely is having a small amount edema related to this. She is on Maxzide and has a Lasix prescription that she could use for significant swelling. Her RA is not well controlled and she continues to have pain and escalating doses of her methotrexate. Blood pressure is at goal. Plan to continue her current medications and we'll see her back on an annual basis.  [name], MD, [facility] Attending Cardiologist [place] 08/05/2015, 12:10 PM

## 2015-08-06 ENCOUNTER — Encounter: Payer: Self-pay | Admitting: Internal Medicine

## 2015-08-06 ENCOUNTER — Ambulatory Visit (INDEPENDENT_AMBULATORY_CARE_PROVIDER_SITE_OTHER): Payer: Medicare Other | Admitting: Internal Medicine

## 2015-08-06 VITALS — BP 126/88 | HR 87 | Temp 98.1°F | Wt 189.0 lb

## 2015-08-06 DIAGNOSIS — G459 Transient cerebral ischemic attack, unspecified: Secondary | ICD-10-CM | POA: Diagnosis not present

## 2015-08-06 DIAGNOSIS — M1990 Unspecified osteoarthritis, unspecified site: Secondary | ICD-10-CM | POA: Diagnosis not present

## 2015-08-06 DIAGNOSIS — C4359 Malignant melanoma of other part of trunk: Secondary | ICD-10-CM

## 2015-08-06 DIAGNOSIS — R609 Edema, unspecified: Secondary | ICD-10-CM

## 2015-08-06 DIAGNOSIS — I10 Essential (primary) hypertension: Secondary | ICD-10-CM

## 2015-08-06 DIAGNOSIS — E669 Obesity, unspecified: Secondary | ICD-10-CM

## 2015-08-06 NOTE — Assessment & Plan Note (Signed)
Encouraged her to work on diet and exercise 

## 2015-08-06 NOTE — Progress Notes (Signed)
Pre visit review using our clinic review tool, if applicable. No additional management support is needed unless otherwise documented below in the visit note. 

## 2015-08-06 NOTE — Assessment & Plan Note (Signed)
MTX being tapered due to elevated LFT She had repeat labs last week, will request form Dr. Amil Amen Continue current therapy at this time

## 2015-08-06 NOTE — Assessment & Plan Note (Signed)
Continue prn lasix  

## 2015-08-06 NOTE — Assessment & Plan Note (Signed)
BP well controlled on current regimen Dr. Amil Amen just checked BMET, will request results I advised her I would be happy to take care of filling her medications for her if needed

## 2015-08-06 NOTE — Progress Notes (Signed)
HPI  Pt presents to the clinic today to for 6 month follow up of chronic conditions.  Rheumatoid Arthritis: She is following with Dr. Amil Amen, rheumatology. She is taking Meloxicam and Methotrexate. This regimen does seem to help control her pain. Her LFTS were elevated at her last visit, so Dr. Amil Amen decreased her Methotrexate. She had her followup labs last Wednesday.  HTN: She is being followed by Dr. Debara Pickett. She sees him every 6 months. She is taking Amlodipine, Enalapril and Triamterene-HCT. She is also taking a potassium supplement. She denies chest pain, chest tightness or shortness of breath. Her BP today is 126/88. ECG for 07/2015 reviewed.  History of Melanoma: Had surgery in 2014. She is following with a dermatologist, Dr. Donnal Debar.  TIA: Occurred 24 years ago, no residual effects.  Obesity: BMI of 34.02. She does not adhere to any specific diet or exercise.  Edema: Occurs occasionally. She reports she will occasionally take Lasix as needed for swelling. When she does take it, she will not take her Triamterene-HCT.   Past Medical History  Diagnosis Date  . Arthritis   . Hypertension   . Melanoma ([place])   . Stroke [facility])     MINI STROKE     21 YRS AGO   . Headache(784.0)     HX MIGRAINES   . Obesity     Current Outpatient Prescriptions  Medication Sig Dispense Refill  . amLODipine (NORVASC) 10 MG tablet Take 0.5 tablets (5 mg total) by mouth 2 (two) times daily. 30 tablet 6  . aspirin EC 325 MG tablet Take 325 mg by mouth daily.    . enalapril (VASOTEC) 20 MG tablet Take 1 tablet (20 mg total) by mouth 2 (two) times daily. 60 tablet 0  . folic acid (FOLVITE) 546 MCG tablet Take 400 mcg by mouth daily.    . furosemide (LASIX) 40 MG tablet TAKE 1 TABLET EVERY DAY AS NEEDED FOR FLUID 30 tablet 2  . KLOR-CON M20 20 MEQ tablet TAKE 1 TABLET (20 MEQ TOTAL) BY MOUTH 2 (TWO) TIMES DAILY. 60 tablet 0  . loratadine (CLARITIN) 10 MG tablet Take 10 mg by mouth daily.    . meloxicam  (MOBIC) 15 MG tablet Take 15 mg by mouth daily.    . methotrexate 2.5 MG tablet Take 15 mg by mouth once a week.    . Misc Natural Products (TURMERIC CURCUMIN) CAPS Take 1 capsule by mouth daily.    . Multiple Vitamin (MULTIVITAMIN WITH MINERALS) TABS Take 1 tablet by mouth daily.    . OMEGA-3 KRILL OIL PO Take 1 capsule by mouth daily.    Marland Kitchen triamterene-hydrochlorothiazide (MAXZIDE-25) 37.5-25 MG per tablet TAKE 1 TABLET BY MOUTH DAILY. 30 tablet 11   No current facility-administered medications for this visit.    Allergies  Allergen Reactions  . Sulfa Antibiotics     SULFA DRUGS    Family History  Problem Relation Age of Onset  . Alzheimer's disease Mother   . Heart disease Father     MI, died at 42  . Alzheimer's disease Maternal Grandmother   . Cancer Maternal Grandfather     lung  . Heart attack Paternal Grandfather   . Tuberculosis Sister     Social History   Social History  . Marital Status: Divorced    Spouse Name: N/A  . Number of Children: 3  . Years of Education: 12   Occupational History  .      Network engineer; raises service dogs.  Social History Main Topics  . Smoking status: Never Smoker   . Smokeless tobacco: Never Used  . Alcohol Use: No  . Drug Use: No  . Sexual Activity: No   Other Topics Concern  . Not on file   Social History Narrative    ROS:  Constitutional: Denies fever, malaise, fatigue, headache or abrupt weight changes.  HEENT: Denies eye pain, eye redness, ear pain, ringing in the ears, wax buildup, runny nose, nasal congestion, bloody nose, or sore throat. Respiratory: Denies difficulty breathing, shortness of breath, cough or sputum production.   Cardiovascular: Denies chest pain, chest tightness, palpitations or swelling in the hands or feet.  Gastrointestinal: Denies abdominal pain, bloating, constipation, diarrhea or blood in the stool.  Musculoskeletal: Pt reports joint pain and swelling. Denies decrease in range of motion,  difficulty with gait, muscle pain.  Skin: Denies redness, rashes, lesions or ulcercations.  Neurological: Denies dizziness, difficulty with memory, difficulty with speech or problems with balance and coordination.   No other specific complaints in a complete review of systems (except as listed in HPI above).  PE: BP 126/88 mmHg  Pulse 87  Temp(Src) 98.1 F (36.7 C) (Oral)  Wt 189 lb (85.73 kg)  SpO2 98%  Wt Readings from Last 3 Encounters:  08/06/15 189 lb (85.73 kg)  08/03/15 193 lb 8 oz (87.771 kg)  01/02/15 182 lb (82.555 kg)    General: Appears her stated age, obese in NAD. Cardiovascular: Normal rate and rhythm. S1,S2 noted.  No murmur, rubs or gallops noted.  Pulmonary/Chest: Normal effort and positive vesicular breath sounds. No respiratory distress. No wheezes, rales or ronchi noted.  Musculoskeletal: Joint deformity noted, mainly in hands. No signs of joint swelling today. Hand grips equal. Neurological: Alert and oriented.  Psychiatric: Mood and affect normal. Behavior is normal. Judgment and thought content normal.     BMET    Component Value Date/Time   NA 136 01/02/2015 1503   K 3.0* 01/02/2015 1503   CL 100 01/02/2015 1503   CO2 30 01/02/2015 1503   GLUCOSE 73 01/02/2015 1503   BUN 20 01/02/2015 1503   CREATININE 1.16 01/02/2015 1503   CREATININE 1.19* 01/16/2014 2105   CALCIUM 9.8 01/02/2015 1503   GFRNONAA 46* 02/28/2013 1545   GFRAA 54* 02/28/2013 1545    Lipid Panel     Component Value Date/Time   CHOL 185 01/02/2015 1503   TRIG 276.0* 01/02/2015 1503   HDL 31.60* 01/02/2015 1503   CHOLHDL 6 01/02/2015 1503   VLDL 55.2* 01/02/2015 1503    CBC    Component Value Date/Time   WBC 9.2 01/02/2015 1503   WBC 8.3 01/16/2014 2105   RBC 4.56 01/02/2015 1503   RBC 3.51* 01/16/2014 2105   HGB 13.6 01/02/2015 1503   HGB 10.1* 01/16/2014 2105   HCT 40.2 01/02/2015 1503   HCT 32.7* 01/16/2014 2105   PLT 292.0 01/02/2015 1503   MCV 88.2 01/02/2015  1503   MCV 93.1 01/16/2014 2105   MCH 28.8 01/16/2014 2105   MCH 26.2 02/28/2013 1545   MCHC 33.8 01/02/2015 1503   MCHC 30.9* 01/16/2014 2105   RDW 16.4* 01/02/2015 1503   LYMPHSABS 2.3 02/28/2013 1545   MONOABS 1.0 02/28/2013 1545   EOSABS 0.3 02/28/2013 1545   BASOSABS 0.1 02/28/2013 1545    Hgb A1C No results found for: HGBA1C   Assessment and Plan:   RTC in 6 months for annual wellness, follow up

## 2015-08-06 NOTE — Patient Instructions (Signed)

## 2015-08-06 NOTE — Assessment & Plan Note (Signed)
No current issues Triglcyerides elevated, holding off on Zetia due to elevated liver enzyme Advised her to consume a low fat diet

## 2015-08-06 NOTE — Assessment & Plan Note (Signed)
No reoccurrence Will continue to monitor 

## 2015-09-12 ENCOUNTER — Other Ambulatory Visit: Payer: Self-pay | Admitting: Internal Medicine

## 2015-09-12 NOTE — Telephone Encounter (Signed)
REFILL 

## 2015-09-19 ENCOUNTER — Telehealth: Payer: Self-pay | Admitting: Internal Medicine

## 2015-09-19 NOTE — Telephone Encounter (Signed)
Pt is aware and pt reports she will call Dr Debara Pickett

## 2015-09-19 NOTE — Addendum Note (Signed)
Addended by: Lurlean Nanny on: 09/19/2015 01:22 PM   Modules accepted: Orders, Medications

## 2015-09-19 NOTE — Telephone Encounter (Signed)
Received labs from Dr. Amil Amen. Creatinine 1.22. Can she call Dr. Debara Pickett to see if he wants to adjust her Maxide or Lasix?

## 2015-09-19 NOTE — Addendum Note (Signed)
Addended by: Lurlean Nanny on: 09/19/2015 01:27 PM   Modules accepted: Medications

## 2015-09-26 ENCOUNTER — Other Ambulatory Visit: Payer: Self-pay | Admitting: Internal Medicine

## 2015-09-26 DIAGNOSIS — R945 Abnormal results of liver function studies: Principal | ICD-10-CM

## 2015-09-26 DIAGNOSIS — R7989 Other specified abnormal findings of blood chemistry: Secondary | ICD-10-CM

## 2015-10-05 ENCOUNTER — Ambulatory Visit
Admission: RE | Admit: 2015-10-05 | Discharge: 2015-10-05 | Disposition: A | Discharge status: Home / Self Care | Payer: Medicare Other | Source: Ambulatory Visit | Attending: Internal Medicine | Admitting: Internal Medicine

## 2015-10-05 DIAGNOSIS — R7989 Other specified abnormal findings of blood chemistry: Secondary | ICD-10-CM

## 2015-10-05 DIAGNOSIS — R945 Abnormal results of liver function studies: Principal | ICD-10-CM

## 2015-10-19 ENCOUNTER — Telehealth: Payer: Self-pay

## 2015-10-19 NOTE — Telephone Encounter (Signed)
error 

## 2015-11-07 ENCOUNTER — Other Ambulatory Visit: Payer: Self-pay | Admitting: Internal Medicine

## 2015-11-07 NOTE — Telephone Encounter (Signed)
Rx request sent to pharmacy.  

## 2015-12-25 DIAGNOSIS — Z79899 Other long term (current) drug therapy: Secondary | ICD-10-CM | POA: Diagnosis not present

## 2015-12-25 DIAGNOSIS — M150 Primary generalized (osteo)arthritis: Secondary | ICD-10-CM | POA: Diagnosis not present

## 2015-12-25 DIAGNOSIS — M5136 Other intervertebral disc degeneration, lumbar region: Secondary | ICD-10-CM | POA: Diagnosis not present

## 2015-12-25 DIAGNOSIS — R945 Abnormal results of liver function studies: Secondary | ICD-10-CM | POA: Diagnosis not present

## 2015-12-25 DIAGNOSIS — M0589 Other rheumatoid arthritis with rheumatoid factor of multiple sites: Secondary | ICD-10-CM | POA: Diagnosis not present

## 2016-02-04 ENCOUNTER — Encounter: Payer: Medicare Other | Admitting: Internal Medicine

## 2016-02-22 DIAGNOSIS — M0589 Other rheumatoid arthritis with rheumatoid factor of multiple sites: Secondary | ICD-10-CM | POA: Diagnosis not present

## 2016-02-25 DIAGNOSIS — Z79899 Other long term (current) drug therapy: Secondary | ICD-10-CM | POA: Diagnosis not present

## 2016-02-25 DIAGNOSIS — M150 Primary generalized (osteo)arthritis: Secondary | ICD-10-CM | POA: Diagnosis not present

## 2016-02-25 DIAGNOSIS — R945 Abnormal results of liver function studies: Secondary | ICD-10-CM | POA: Diagnosis not present

## 2016-02-25 DIAGNOSIS — M5136 Other intervertebral disc degeneration, lumbar region: Secondary | ICD-10-CM | POA: Diagnosis not present

## 2016-02-25 DIAGNOSIS — M0589 Other rheumatoid arthritis with rheumatoid factor of multiple sites: Secondary | ICD-10-CM | POA: Diagnosis not present

## 2016-04-21 ENCOUNTER — Encounter: Payer: Medicare Other | Admitting: Internal Medicine

## 2016-08-04 ENCOUNTER — Encounter: Payer: Self-pay | Admitting: Internal Medicine

## 2016-08-04 ENCOUNTER — Ambulatory Visit (INDEPENDENT_AMBULATORY_CARE_PROVIDER_SITE_OTHER): Payer: Medicare Other | Admitting: Internal Medicine

## 2016-08-04 VITALS — BP 148/100 | HR 92 | Temp 98.1°F | Ht 61.75 in | Wt 185.0 lb

## 2016-08-04 DIAGNOSIS — R7309 Other abnormal glucose: Secondary | ICD-10-CM | POA: Diagnosis not present

## 2016-08-04 DIAGNOSIS — Z136 Encounter for screening for cardiovascular disorders: Secondary | ICD-10-CM | POA: Diagnosis not present

## 2016-08-04 DIAGNOSIS — Z23 Encounter for immunization: Secondary | ICD-10-CM

## 2016-08-04 DIAGNOSIS — E559 Vitamin D deficiency, unspecified: Secondary | ICD-10-CM | POA: Diagnosis not present

## 2016-08-04 DIAGNOSIS — E781 Pure hyperglyceridemia: Secondary | ICD-10-CM

## 2016-08-04 DIAGNOSIS — I10 Essential (primary) hypertension: Secondary | ICD-10-CM | POA: Diagnosis not present

## 2016-08-04 DIAGNOSIS — G459 Transient cerebral ischemic attack, unspecified: Secondary | ICD-10-CM | POA: Diagnosis not present

## 2016-08-04 DIAGNOSIS — C4359 Malignant melanoma of other part of trunk: Secondary | ICD-10-CM

## 2016-08-04 DIAGNOSIS — M1990 Unspecified osteoarthritis, unspecified site: Secondary | ICD-10-CM

## 2016-08-04 DIAGNOSIS — Z0000 Encounter for general adult medical examination without abnormal findings: Secondary | ICD-10-CM

## 2016-08-04 DIAGNOSIS — E669 Obesity, unspecified: Secondary | ICD-10-CM

## 2016-08-04 DIAGNOSIS — R739 Hyperglycemia, unspecified: Secondary | ICD-10-CM

## 2016-08-04 NOTE — Assessment & Plan Note (Signed)
Elevated today because she has been out of her Amlodipine She has a refill waiting on her Continue Enalapril and Triamterene HCT

## 2016-08-04 NOTE — Assessment & Plan Note (Signed)
No residual effect Lipid Profile today

## 2016-08-04 NOTE — Patient Instructions (Signed)

## 2016-08-04 NOTE — Assessment & Plan Note (Signed)
Encouraged her to work on diet and exercise 

## 2016-08-04 NOTE — Progress Notes (Signed)
HPI:  Pt presents to the clinic today for her Medicare Wellness Exam. She is also due to follow up chronic conditions.  Rheumatoid Arthritis: She is following with Dr. Amil Amen, rheumatology. She is taking Meloxicam and Arava. This regimen does seem to help control her pain. Her LFTS were elevated at her last visit, so Dr. Amil Amen stopped her Methotrexate and switched her to the [place].    HTN: She is being followed by Dr. Debara Pickett. She sees him every 6 months. She is taking Amlodipine, Enalapril and Triamterene-HCT, although she reports she ran out of her Amlodipine 2 days ago. She is also taking a potassium supplement. She denies chest pain, chest tightness or shortness of breath. Her BP today is 148/100. ECG from 07/2015 reviewed.   History of Melanoma: Had surgery in 2014. She is following with a dermatologist, Dr. Donnal Debar.   TIA: Occurred 24 years ago, no residual effects.   Obesity: She lost 8 lbs since her last visit. BMI of 34.11. She does not adhere to any specific diet or exercise.      Past Medical History:  Diagnosis Date  . Arthritis   . Headache(784.0)    HX MIGRAINES   . Hypertension   . Melanoma ([place])   . Obesity   . Stroke [facility])    MINI STROKE     21 YRS AGO     Current Outpatient Prescriptions  Medication Sig Dispense Refill  . amLODipine (NORVASC) 10 MG tablet TAKE 0.5 TABLETS (5 MG TOTAL) BY MOUTH 2 (TWO) TIMES DAILY. 30 tablet 9  . aspirin EC 325 MG tablet Take 325 mg by mouth daily.    . enalapril (VASOTEC) 20 MG tablet TAKE 1 TABLET BY MOUTH TWICE A DAY 60 tablet 11  . folic acid (FOLVITE) Q000111Q MCG tablet Take 400 mcg by mouth daily.    Marland Kitchen KLOR-CON M20 20 MEQ tablet TAKE 1 TABLET (20 MEQ TOTAL) BY MOUTH 2 (TWO) TIMES DAILY. 60 tablet 9  . loratadine (CLARITIN) 10 MG tablet Take 10 mg by mouth daily.    . meloxicam (MOBIC) 15 MG tablet Take 15 mg by mouth daily.    . methotrexate 2.5 MG tablet Take 15 mg by mouth once a week.    . Misc Natural Products (TURMERIC  CURCUMIN) CAPS Take 1 capsule by mouth daily.    . Multiple Vitamin (MULTIVITAMIN WITH MINERALS) TABS Take 1 tablet by mouth daily.    . OMEGA-3 KRILL OIL PO Take 1 capsule by mouth daily.    Marland Kitchen triamterene-hydrochlorothiazide (MAXZIDE-25) 37.5-25 MG per tablet TAKE 1 TABLET BY MOUTH DAILY. 30 tablet 11   No current facility-administered medications for this visit.     Allergies  Allergen Reactions  . Sulfa Antibiotics     SULFA DRUGS    Family History  Problem Relation Age of Onset  . Alzheimer's disease Mother   . Heart disease Father     MI, died at 73  . Alzheimer's disease Maternal Grandmother   . Cancer Maternal Grandfather     lung  . Heart attack Paternal Grandfather   . Tuberculosis Sister     Social History   Social History  . Marital status: Divorced    Spouse name: N/A  . Number of children: 3  . Years of education: 12   Occupational History  .  [employer] Warden/ranger; raises Horticulturist, commercial.    Social History Main Topics  . Smoking status: Never Smoker  . Smokeless tobacco:  Never Used  . Alcohol use No  . Drug use: No  . Sexual activity: No   Other Topics Concern  . Not on file   Social History Narrative  . No narrative on file    Hospitiliaztions: None  Health Maintenance:    Flu: wants one today  Tetanus: > 10 years  Pneumovax: never  Prevnar: never  Zostavax: never  Mammogram: not interested in screening  Pap Smear: not interested in screening  Bone Density: > 5 years ago  Colon Screening:  2007  Eye Doctor: annually  Dental Exam: biannually   Providers:   PCP: Webb Silversmith, NP-C  Dermatologist: Dr. Donnal Debar  Cardiologist: Dr. Debara Pickett  Rheumatology: Dr. Amil Amen   I have personally reviewed and have noted:  1. The patient's medical and social history 2. Their use of alcohol, tobacco or illicit drugs 3. Their current medications and supplements 4. The patient's functional ability including ADL's, fall risks, home  safety risks and hearing or visual impairment. 5. Diet and physical activities 6. Evidence for depression or mood disorder  Subjective:   Review of Systems:   Constitutional: Denies fever, malaise, fatigue, headache or abrupt weight changes.  HEENT: Denies eye pain, eye redness, ear pain, ringing in the ears, wax buildup, runny nose, nasal congestion, bloody nose, or sore throat. Respiratory: Denies difficulty breathing, shortness of breath, cough or sputum production.   Cardiovascular: Pt reports swelling of her left leg. Denies chest pain, chest tightness, palpitations or swelling in the hands Gastrointestinal: Denies abdominal pain, bloating, constipation, diarrhea or blood in the stool.  GU: Denies urgency, frequency, pain with urination, burning sensation, blood in urine, odor or discharge. Musculoskeletal: Denies decrease in range of motion, difficulty with gait, muscle pain or joint pain and swelling.  Skin: Denies redness, rashes, lesions or ulcercations.  Neurological: Denies dizziness, difficulty with memory, difficulty with speech or problems with balance and coordination.  Psych: Denies anxiety, depression, SI/HI.  No other specific complaints in a complete review of systems (except as listed in HPI above).  Objective:  PE:   BP (!) 148/100   Pulse 92   Temp 98.1 F (36.7 C) (Oral)   Ht 5' 1.75" (1.568 m)   Wt 185 lb (83.9 kg)   SpO2 97%   BMI 34.11 kg/m   Wt Readings from Last 3 Encounters:  08/06/15 189 lb (85.7 kg)  08/03/15 193 lb 8 oz (87.8 kg)  01/02/15 182 lb (82.6 kg)    General: Appears her stated age, obese in NAD. Skin: Warm, dry and intact. 1 cm black cauliflower lesion noted on right upper arm. Cardiovascular: Normal rate and rhythm. S1,S2 noted.  No murmur, rubs or gallops noted. No JVD or BLE edema. No carotid bruits noted. Pulmonary/Chest: Normal effort and positive vesicular breath sounds. No respiratory distress. No wheezes, rales or ronchi  noted.  Musculoskeletal: Strength 5/5 BUE/BLE. No difficulty with gait.  Neurological: Alert and oriented.  Psychiatric: Mood and affect normal. Behavior is normal. Judgment and thought content normal.     BMET    Component Value Date/Time   NA 136 01/02/2015 1503   K 3.0 (L) 01/02/2015 1503   CL 100 01/02/2015 1503   CO2 30 01/02/2015 1503   GLUCOSE 73 01/02/2015 1503   BUN 20 01/02/2015 1503   CREATININE 1.16 01/02/2015 1503   CREATININE 1.19 (H) 01/16/2014 2105   CALCIUM 9.8 01/02/2015 1503   GFRNONAA 46 (L) 02/28/2013 1545   GFRAA 54 (L) 02/28/2013 1545  Lipid Panel     Component Value Date/Time   CHOL 185 01/02/2015 1503   TRIG 276.0 (H) 01/02/2015 1503   HDL 31.60 (L) 01/02/2015 1503   CHOLHDL 6 01/02/2015 1503   VLDL 55.2 (H) 01/02/2015 1503    CBC    Component Value Date/Time   WBC 9.2 01/02/2015 1503   RBC 4.56 01/02/2015 1503   HGB 13.6 01/02/2015 1503   HCT 40.2 01/02/2015 1503   PLT 292.0 01/02/2015 1503   MCV 88.2 01/02/2015 1503   MCV 93.1 01/16/2014 2105   MCH 28.8 01/16/2014 2105   MCH 26.2 02/28/2013 1545   MCHC 33.8 01/02/2015 1503   RDW 16.4 (H) 01/02/2015 1503   LYMPHSABS 2.3 02/28/2013 1545   MONOABS 1.0 02/28/2013 1545   EOSABS 0.3 02/28/2013 1545   BASOSABS 0.1 02/28/2013 1545    Hgb A1C No results found for: HGBA1C    Assessment and Plan:   Medicare Annual Wellness Visit:  Diet: She does eat meat. She consumes fruits and veggies most days of the week. She does eat fried foods. She drinks water and soda. Physical activity: Sedentary Depression/mood screen: Negative Hearing: Intact to whispered voice Visual acuity: Grossly normal, performs annual eye exam  ADLs: Capable Fall risk: None Home safety: Good Cognitive evaluation: Intact to orientation, naming, recall and repetition EOL planning: No adv directives, full code/ I agree  Preventative Medicine: Flu and prevnar today. Pneumovax in 1 year. She declines tetanus or  zostovax today. She declines mammogram, pap smear, bone density, colonoscopy, cologuard today. Encouraged her to consume a balance diet and exercise regimen. Advised her to see an eye doctor and dentist annually.   Next appointment: 1 year annual exam   Webb Silversmith, NP

## 2016-08-04 NOTE — Assessment & Plan Note (Signed)
She will continue to follow with rheumatology Continue Meloxicam and Arava CMET and CBC today

## 2016-08-04 NOTE — Assessment & Plan Note (Signed)
She will continue to follow with dermatology yearly. 

## 2016-08-05 LAB — CBC
HCT: 40.8 % (ref 36.0–46.0)
Hemoglobin: 13.9 g/dL (ref 12.0–15.0)
MCHC: 34.1 g/dL (ref 30.0–36.0)
MCV: 83.2 fl (ref 78.0–100.0)
Platelets: 201 10*3/uL (ref 150.0–400.0)
RBC: 4.91 Mil/uL (ref 3.87–5.11)
RDW: 14.6 % (ref 11.5–15.5)
WBC: 8.9 10*3/uL (ref 4.0–10.5)

## 2016-08-05 LAB — COMPREHENSIVE METABOLIC PANEL
ALT: 17 U/L (ref 0–35)
AST: 26 U/L (ref 0–37)
Albumin: 4.2 g/dL (ref 3.5–5.2)
Alkaline Phosphatase: 110 U/L (ref 39–117)
BUN: 15 mg/dL (ref 6–23)
CO2: 25 mEq/L (ref 19–32)
Calcium: 9.9 mg/dL (ref 8.4–10.5)
Chloride: 103 mEq/L (ref 96–112)
Creatinine, Ser: 1.09 mg/dL (ref 0.40–1.20)
GFR: 53.1 mL/min — ABNORMAL LOW (ref >60.00)
Glucose, Bld: 98 mg/dL (ref 70–99)
Potassium: 3.5 mEq/L (ref 3.5–5.1)
Sodium: 138 mEq/L (ref 135–145)
Total Bilirubin: 0.3 mg/dL (ref 0.2–1.2)
Total Protein: 7.2 g/dL (ref 6.0–8.3)

## 2016-08-05 LAB — LIPID PANEL
Cholesterol: 212 mg/dL — ABNORMAL HIGH (ref 0–200)
HDL: 28.3 mg/dL — ABNORMAL LOW (ref >39.00)
Total CHOL/HDL Ratio: 8
Triglycerides: 1118 mg/dL — ABNORMAL HIGH (ref 0.0–149.0)

## 2016-08-05 LAB — LDL CHOLESTEROL, DIRECT: Direct LDL: 65 mg/dL

## 2016-08-05 LAB — VITAMIN D 25 HYDROXY (VIT D DEFICIENCY, FRACTURES): VITD: 19.92 ng/mL — ABNORMAL LOW (ref 30.00–100.00)

## 2016-08-05 LAB — HEMOGLOBIN A1C: Hgb A1c MFr Bld: 5.4 % (ref 4.6–6.5)

## 2016-08-08 MED ORDER — VITAMIN D (ERGOCALCIFEROL) 1.25 MG (50000 UNIT) PO CAPS: 50000.0000 [IU] | ORAL_CAPSULE | ORAL | 0 refills | Status: DC

## 2016-08-08 MED ORDER — FENOFIBRATE 40 MG PO TABS: 1.0000 | ORAL_TABLET | Freq: Every day | ORAL | 2 refills | Status: DC

## 2016-08-08 NOTE — Addendum Note (Signed)
Addended by: Lurlean Nanny on: 08/08/2016 11:20 AM   Modules accepted: Orders

## 2016-08-13 ENCOUNTER — Telehealth: Payer: Self-pay

## 2016-08-13 NOTE — Telephone Encounter (Signed)
Pt went to [facility] [address] and Fenofibrate was > $200.00 cost to pt. Pt checked with [facility] in [place] and cost to pt was $12.00. Pt will have [facility] transfer rx to their pharmacy.

## 2016-08-14 MED ORDER — FENOFIBRATE 48 MG PO TABS: 48.0000 mg | ORAL_TABLET | Freq: Every day | ORAL | 2 refills | Status: DC

## 2016-08-14 NOTE — Telephone Encounter (Signed)
Pt called back and [facility] charged pt $159.00 for fenofibrate 40 mg. If pt can get fenofibrate 48 mg the cost to pt will be $11.00 at [facility]. Pt request cb.

## 2016-08-14 NOTE — Telephone Encounter (Signed)
New Rx for the 48mg  sent to [facility] per pt request and pt is aware

## 2016-08-14 NOTE — Addendum Note (Signed)
Addended by: Lurlean Nanny on: 08/14/2016 02:44 PM   Modules accepted: Orders

## 2016-08-14 NOTE — Telephone Encounter (Signed)
Ok to change to 48 mg

## 2016-08-15 NOTE — Addendum Note (Signed)
Addended by: Lurlean Nanny on: 08/15/2016 09:19 AM   Modules accepted: Orders

## 2016-10-01 ENCOUNTER — Other Ambulatory Visit: Payer: Self-pay | Admitting: Internal Medicine

## 2016-10-02 ENCOUNTER — Other Ambulatory Visit: Payer: Self-pay

## 2016-10-02 MED ORDER — ENALAPRIL MALEATE 20 MG PO TABS: 20.0000 mg | ORAL_TABLET | Freq: Two times a day (BID) | ORAL | 0 refills | Status: DC

## 2016-10-07 ENCOUNTER — Other Ambulatory Visit (INDEPENDENT_AMBULATORY_CARE_PROVIDER_SITE_OTHER): Payer: Medicare Other

## 2016-10-07 DIAGNOSIS — E781 Pure hyperglyceridemia: Secondary | ICD-10-CM | POA: Diagnosis not present

## 2016-10-07 LAB — LIPID PANEL
Cholesterol: 198 mg/dL (ref 0–200)
HDL: 37.2 mg/dL — ABNORMAL LOW (ref >39.00)
NonHDL: 160.53
Total CHOL/HDL Ratio: 5
Triglycerides: 384 mg/dL — ABNORMAL HIGH (ref 0.0–149.0)
VLDL: 76.8 mg/dL — ABNORMAL HIGH (ref 0.0–40.0)

## 2016-10-07 LAB — LDL CHOLESTEROL, DIRECT: Direct LDL: 111 mg/dL

## 2016-10-10 MED ORDER — FENOFIBRATE 145 MG PO TABS: 145.0000 mg | ORAL_TABLET | Freq: Every day | ORAL | 3 refills | Status: DC

## 2016-10-10 NOTE — Addendum Note (Signed)
Addended by: Lindalou Hose Y on: 10/10/2016 04:00 PM   Modules accepted: Orders

## 2016-10-24 ENCOUNTER — Ambulatory Visit (INDEPENDENT_AMBULATORY_CARE_PROVIDER_SITE_OTHER): Payer: Medicare Other | Admitting: Internal Medicine

## 2016-10-24 ENCOUNTER — Encounter: Payer: Self-pay | Admitting: Internal Medicine

## 2016-10-24 VITALS — BP 136/98 | HR 83 | Ht 62.5 in | Wt 174.8 lb

## 2016-10-24 DIAGNOSIS — M069 Rheumatoid arthritis, unspecified: Secondary | ICD-10-CM

## 2016-10-24 DIAGNOSIS — Z79899 Other long term (current) drug therapy: Secondary | ICD-10-CM | POA: Diagnosis not present

## 2016-10-24 DIAGNOSIS — I10 Essential (primary) hypertension: Secondary | ICD-10-CM | POA: Diagnosis not present

## 2016-10-24 DIAGNOSIS — E785 Hyperlipidemia, unspecified: Secondary | ICD-10-CM | POA: Diagnosis not present

## 2016-10-24 MED ORDER — ROSUVASTATIN CALCIUM 20 MG PO TABS: 20.0000 mg | ORAL_TABLET | Freq: Every day | ORAL | 3 refills | Status: DC

## 2016-10-24 MED ORDER — IRBESARTAN 300 MG PO TABS: 300.0000 mg | ORAL_TABLET | Freq: Every day | ORAL | 11 refills | Status: DC

## 2016-10-24 NOTE — Patient Instructions (Addendum)
Your physician has recommended you make the following change in your medication:  -- START crestor 20mg  once daily -- START irbesartan 300mg  once daily -- STOP enalapril  Your physician recommends that you schedule a follow-up appointment in: 2-3 weeks for a blood pressure appointment with our clinical pharmacy staff.  Your physician recommends that you return for lab work in Jan 22-26 ([place])   Your physician recommends that you return for lab work in [place] (fasting lipid)    Your physician wants you to follow-up in: ONE  YEAR with Dr. Debara Pickett. You will receive a reminder letter in the mail two months in advance. If you don't receive a letter, please call our office to schedule the follow-up appointment.

## 2016-10-24 NOTE — Progress Notes (Signed)
OFFICE NOTE  Chief Complaint:  Joint pain  Primary Care Physician: Webb Silversmith, NP  HPI:  Morgan Bell is a pleasant 66 year old female appears that although Dr. Rex Kras. His history of hypertension and obesity. She had hip replacement 2012 and continues to suffer from arthritis which is pretty disabling. She is not particularly active. Her blood pressure has been uncontrolled as she was not taking her Norvasc however she is restarted that and her blood pressures are better. She denies any chest pain or shortness of breath. EKG in the office today does show some high lateral T wave inversions which have seemingly progressed over the past several years. Despite that she is not symptomatic. It is questionable how active she is though.  I saw Morgan Bell back in the office today. She's recently complained of some mild shortness of breath with exertion but it seems to be fairly stable. She denies any chest pain with this. She does have significant rheumatoid arthritis and is having recent adjustments on her methotrexate dosing. She gets a small amount of swelling in her legs which I think is attributed to Norvasc. Blood pressure is well-controlled today. Initially was 136/98, however recheck the end of the visit was 120/84.  10/24/2016  Morgan Bell returns today for follow-up. Recently she had lab work which indicated severe hypertriglyceridemia. Not clear whether this was a fasting or nonfasting study however her total cholesterol was 212, triglycerides were 1118, HDL 28 and direct LDL was 111. She was placed on fenofibrate and her triglycerides are now reduced to 384. She is not on statin therapy although this is described on recommended. In addition blood pressure is elevated today at 156/98. He was previously 136/98. She said she discontinued her backside. She is also close to running out of enalapril.  PMHx:  Past Medical History:  Diagnosis Date  . Arthritis   . Headache(784.0)    HX MIGRAINES   . Hypertension   . Melanoma ([place])   . Obesity   . Stroke [facility])    MINI STROKE     21 YRS AGO     Past Surgical History:  Procedure Laterality Date  . CESAREAN SECTION  11/26/1973  . CESAREAN SECTION  09/22/1975  . CESAREAN SECTION  04/30/1978  . HEMORRHOID SURGERY  06/1987  . MELANOMA EXCISION WITH SENTINEL LYMPH NODE BIOPSY Right 03/02/2013   Procedure: Wide MELANOMA EXCISION Right Abdominal wall WITH SENTINEL LYMPH NODE mapping Right Axilla;  Surgeon: Merrie Roof, MD;  Location: [facility];  Service: General;  Laterality: Right;  . NM [place]  2003   negative Bruce protocol exercise stress test with no evidence of perfusion abnormality, EF 66%  . TOTAL HIP ARTHROPLASTY Right 03/2011    FAMHx:  Family History  Problem Relation Age of Onset  . Alzheimer's disease Mother   . Heart disease Father     MI, died at 4  . Alzheimer's disease Maternal Grandmother   . Cancer Maternal Grandfather     lung  . Heart attack Paternal Grandfather   . Tuberculosis Sister     SOCHx:   reports that she has never smoked. She has never used smokeless tobacco. She reports that she does not drink alcohol or use drugs.  ALLERGIES:  Allergies  Allergen Reactions  . Sulfa Antibiotics     SULFA DRUGS    ROS: Pertinent items noted in HPI and remainder of comprehensive ROS otherwise negative.  HOME MEDS: Current Outpatient Prescriptions  Medication Sig Dispense Refill  . amLODipine (NORVASC) 10 MG tablet TAKE 1/2 TABLET BY MOUTH TWICE DAILY 30 tablet 0  . aspirin EC 325 MG tablet Take 325 mg by mouth daily.    . fenofibrate (TRICOR) 145 MG tablet Take 1 tablet (145 mg total) by mouth daily. 30 tablet 3  . KLOR-CON M20 20 MEQ tablet TAKE 1 TABLET (20 MEQ TOTAL) BY MOUTH 2 (TWO) TIMES DAILY. 60 tablet 9  . leflunomide (ARAVA) 10 MG tablet Take 1 tablet by mouth daily.  2  . loratadine (CLARITIN) 10 MG tablet Take 10 mg by mouth daily.    . meloxicam (MOBIC) 15  MG tablet Take 15 mg by mouth daily.    . Misc Natural Products (TURMERIC CURCUMIN) CAPS Take 1 capsule by mouth daily.    . Multiple Vitamin (MULTIVITAMIN WITH MINERALS) TABS Take 1 tablet by mouth daily.    . OMEGA-3 KRILL OIL PO Take 1 capsule by mouth daily.    . Vitamin D, Ergocalciferol, (DRISDOL) 50000 units CAPS capsule Take 1 capsule (50,000 Units total) by mouth every 7 (seven) days. 12 capsule 0  . irbesartan (AVAPRO) 300 MG tablet Take 1 tablet (300 mg total) by mouth daily. 30 tablet 11  . rosuvastatin (CRESTOR) 20 MG tablet Take 1 tablet (20 mg total) by mouth daily. 90 tablet 3   No current facility-administered medications for this visit.     LABS/IMAGING: No results found for this or any previous visit (from the past 48 hour(s)). No results found.  VITALS: BP (!) 156/98   Pulse 83   Ht 5' 2.5" (1.588 m)   Wt 174 lb 12.8 oz (79.3 kg)   BMI 31.46 kg/m   EXAM: General appearance: alert and no distress Neck: no carotid bruit and no JVD Lungs: clear to auscultation bilaterally Heart: regular rate and rhythm, S1, S2 normal, no murmur, click, rub or gallop Abdomen: soft, non-tender; bowel sounds normal; no masses,  no organomegaly Extremities: extremities normal, atraumatic, no cyanosis or edema Pulses: 2+ and symmetric Skin: Skin color, texture, turgor normal. No rashes or lesions Neurologic: Grossly normal Psych: Mood, affect normal  EKG: Normal sinus rhythm at 83, left anterior fascicular block, LVH by voltage  ASSESSMENT: 1. Hypertension-uncontrolled 2. Abnormal EKG-asymptomatic 3. Rheumatoid arthritis 4. Leg edema 5. Dyslipidemia-with recent severe hypertriglyceridemia  PLAN: 1.   Morgan Bell is not at goal with regards to her blood pressure control. As she has just run out of enalapril, will replace that with irbesartan 300 mg daily in addition to her amlodipine. If blood pressure remains uncontrolled at a repeat check in 3 weeks, then I would advocate  adding back chlorthalidone. With regards to her dyslipidemia, she has had an excellent response to fenofibrate however first-line therapy is with statins. I would advise adding a statin which should help with her triglycerides significantly. I would recommend Crestor 20 mg daily which is a highly potent statin. Her goal LDL should be less than 70 given rheumatoid arthritis which is a coronary artery disease risk factor, conferring more risk than diabetes.  Follow-up in hypertension clinic in 2-3 weeks and with me annually or sooner as necessary. We will order repeat lipid testing in 2-3 months.  [name], MD, [facility] Attending Cardiologist [facility] 10/24/2016, 3:27 PM

## 2016-11-04 ENCOUNTER — Other Ambulatory Visit: Payer: Self-pay | Admitting: Internal Medicine

## 2016-11-04 NOTE — Telephone Encounter (Signed)
REFILL 

## 2016-11-06 ENCOUNTER — Other Ambulatory Visit: Payer: Self-pay | Admitting: Internal Medicine

## 2016-11-06 ENCOUNTER — Other Ambulatory Visit: Payer: Self-pay

## 2016-11-06 DIAGNOSIS — E559 Vitamin D deficiency, unspecified: Secondary | ICD-10-CM

## 2016-11-06 NOTE — Telephone Encounter (Signed)
She was supposed to come back to have her Vit D drawn. I ordered lab, have her make lab only appt

## 2016-11-06 NOTE — Telephone Encounter (Signed)
Pt left v/m requesting cb to see if vit D was going to be refilled at [facility]. Last filled # 12 on 08/08/16. Last annual 08/04/16.

## 2016-11-07 NOTE — Telephone Encounter (Signed)
Pt will be going to [facility] for labs on Monday and she will see if they can draw vit d lab for [place] and it is future ordered

## 2016-11-10 ENCOUNTER — Telehealth: Payer: Self-pay | Admitting: Internal Medicine

## 2016-11-10 ENCOUNTER — Ambulatory Visit (INDEPENDENT_AMBULATORY_CARE_PROVIDER_SITE_OTHER): Payer: Medicare Other | Admitting: Pharmacist Clinician (PhC)/ Clinical Pharmacy Specialist

## 2016-11-10 DIAGNOSIS — E785 Hyperlipidemia, unspecified: Secondary | ICD-10-CM | POA: Diagnosis not present

## 2016-11-10 DIAGNOSIS — Z79899 Other long term (current) drug therapy: Secondary | ICD-10-CM | POA: Diagnosis not present

## 2016-11-10 DIAGNOSIS — I10 Essential (primary) hypertension: Secondary | ICD-10-CM | POA: Diagnosis not present

## 2016-11-10 NOTE — Assessment & Plan Note (Signed)
Blood pressure still elevated in the office today, although much improved from last visit.  Per patient her home readings have dropped into mostly A999333 systolic range, however the diastolic readings are still running in the 80's and higher. Will have her switch one of her BP medications to nights for now and see her back in 4-6 weeks for follow up.  If at that time she is still having elevated readings will consider addition of diuretic.  Patient states she was previously on maxzide, but was told to discontinue by another MD.  Still has some at home, but I have asked her not to re-start it until we determine if it is needed.

## 2016-11-10 NOTE — Progress Notes (Signed)
11/11/2016 [name] 03-22-1951 [id number]   HPI:  IZELLAH LESNER is a 66 y.o. female patient of Dr Sallyanne Kuster, with a PMH below who presents today for hypertension clinic evaluation.  Sh saw Dr. Sallyanne Kuster two weeks ago with a pressure of 156/98.  He switched her enalapril to irbesartan 300 mg.  Her cardiac history is significant for hypertension as well as history of stroke.  NO SOB, CP or LEE (previously noted edema, but has completely resolved since she stopped drinking [facility])   Blood Pressure Goal:  130/80   Current Medications:  Irbesartan 300 mg daily (am)  Amlodipine 10 mg daily (am)  Family Hx:   Hypertension in father and his siblings, sister, older son (lost weight and did better);  Father with first MI at 39, then again at 34.  Died at 45 (suicide).  Mother still living at 12 with memory problems  Social Hx:  No tobacco or alcohol, quit caffeine last October because of high TG's.  Was drinking [employer]  Diet:  No added salt, eats out about 1/2 of meals - usually lunch at sandwich shop   Exercise:  No regular exercise, has RA and difficulties getting around  Home BP readings:  Has home meter ([employer] - wrist cuff) "borrowed" from her son - last few days 123456 systolic, diastolic 123456  Intolerances:   Sulfa antibiotics  Wt Readings from Last 3 Encounters:  10/24/16 174 lb 12.8 oz (79.3 kg)  08/04/16 185 lb (83.9 kg)  08/06/15 189 lb (85.7 kg)   BP Readings from Last 3 Encounters:  11/10/16 (!) 138/98  10/24/16 (!) 136/98  08/04/16 (!) 148/100   Pulse Readings from Last 3 Encounters:  11/10/16 100  10/24/16 83  08/04/16 92    Current Outpatient Prescriptions  Medication Sig Dispense Refill  . amLODipine (NORVASC) 10 MG tablet TAKE 1/2 TABLET BY MOUTH TWICE DAILY 30 tablet 11  . aspirin EC 325 MG tablet Take 325 mg by mouth daily.    . fenofibrate (TRICOR) 145 MG tablet Take 1 tablet (145 mg total) by mouth daily. 30 tablet 3  .  irbesartan (AVAPRO) 300 MG tablet Take 1 tablet (300 mg total) by mouth daily. 30 tablet 11  . KLOR-CON M20 20 MEQ tablet TAKE 1 TABLET (20 MEQ TOTAL) BY MOUTH 2 (TWO) TIMES DAILY. 60 tablet 9  . leflunomide (ARAVA) 10 MG tablet Take 1 tablet by mouth daily.  2  . loratadine (CLARITIN) 10 MG tablet Take 10 mg by mouth daily.    . meloxicam (MOBIC) 15 MG tablet Take 15 mg by mouth daily.    . Misc Natural Products (TURMERIC CURCUMIN) CAPS Take 1 capsule by mouth daily.    . Multiple Vitamin (MULTIVITAMIN WITH MINERALS) TABS Take 1 tablet by mouth daily.    . OMEGA-3 KRILL OIL PO Take 1 capsule by mouth daily.    . rosuvastatin (CRESTOR) 20 MG tablet Take 1 tablet (20 mg total) by mouth daily. 90 tablet 3  . Vitamin D, Ergocalciferol, (DRISDOL) 50000 units CAPS capsule Take 1 capsule (50,000 Units total) by mouth every 7 (seven) days. 12 capsule 0   No current facility-administered medications for this visit.     Allergies  Allergen Reactions  . Sulfa Antibiotics     SULFA DRUGS    Past Medical History:  Diagnosis Date  . Arthritis   . Headache(784.0)    HX MIGRAINES   . Hypertension   . Melanoma ([place])   .  Obesity   . Stroke [facility])    MINI STROKE     21 YRS AGO     Blood pressure (!) 138/98, pulse 100.  HTN (hypertension) Blood pressure still elevated in the office today, although much improved from last visit.  Per patient her home readings have dropped into mostly A999333 systolic range, however the diastolic readings are still running in the 80's and higher. Will have her switch one of her BP medications to nights for now and see her back in 4-6 weeks for follow up.  If at that time she is still having elevated readings will consider addition of diuretic.  Patient states she was previously on maxzide, but was told to discontinue by another MD.  Still has some at home, but I have asked her not to re-start it until we determine if it is needed.   Tommy Medal PharmD CPP [facility]

## 2016-11-10 NOTE — Telephone Encounter (Signed)
Patient for CVRR visit at 2. Will leave this for discussion at visit.

## 2016-11-10 NOTE — Telephone Encounter (Signed)
New message  Pt call requesting to speak with RN. Pt would like to know if it would possible to get her Vitamin D levels check when she comes in for her ov today. Please call back to discuss

## 2016-11-10 NOTE — Telephone Encounter (Signed)
Didn't see note until after patient gone, she did not ask

## 2016-11-10 NOTE — Patient Instructions (Signed)
Return for a a follow up appointment in 4-6 weeks  Your blood pressure today is 138/98  (goal is < 130/80)  Check your blood pressure at home daily and keep record of the readings.  Take your BP meds as follows:  Continue with both medications, just switch one of them to evenings starting tomorrow  Bring all of your meds, your BP cuff and your record of home blood pressures to your next appointment.  Exercise as you're able, try to walk approximately 30 minutes per day.  Keep salt intake to a minimum, especially watch canned and prepared boxed foods.  Eat more fresh fruits and vegetables and fewer canned items.  Avoid eating in fast food restaurants.    HOW TO TAKE YOUR BLOOD PRESSURE: . Rest 5 minutes before taking your blood pressure. .  Don't smoke or drink caffeinated beverages for at least 30 minutes before. . Take your blood pressure before (not after) you eat. . Sit comfortably with your back supported and both feet on the floor (don't cross your legs). . Elevate your arm to heart level on a table or a desk. . Use the proper sized cuff. It should fit smoothly and snugly around your bare upper arm. There should be enough room to slip a fingertip under the cuff. The bottom edge of the cuff should be 1 inch above the crease of the elbow. . Ideally, take 3 measurements at one sitting and record the average.

## 2016-11-11 LAB — COMPREHENSIVE METABOLIC PANEL
ALT: 17 U/L (ref 6–29)
AST: 27 U/L (ref 10–35)
Albumin: 4.2 g/dL (ref 3.6–5.1)
Alkaline Phosphatase: 75 U/L (ref 33–130)
BUN: 25 mg/dL (ref 7–25)
CO2: 23 mmol/L (ref 20–31)
Calcium: 10.1 mg/dL (ref 8.6–10.4)
Chloride: 104 mmol/L (ref 98–110)
Creat: 1.45 mg/dL — ABNORMAL HIGH (ref 0.50–0.99)
Glucose, Bld: 114 mg/dL — ABNORMAL HIGH (ref 65–99)
Potassium: 3.8 mmol/L (ref 3.5–5.3)
Sodium: 139 mmol/L (ref 135–146)
Total Bilirubin: 0.4 mg/dL (ref 0.2–1.2)
Total Protein: 7.3 g/dL (ref 6.1–8.1)

## 2016-11-12 ENCOUNTER — Telehealth: Payer: Self-pay | Admitting: Pharmacist Clinician (PhC)/ Clinical Pharmacy Specialist

## 2016-11-12 DIAGNOSIS — I10 Essential (primary) hypertension: Secondary | ICD-10-CM

## 2016-11-12 NOTE — Telephone Encounter (Signed)
-----   Message from Sanda Klein, MD sent at 11/11/2016  1:20 PM EST ----- Renal function slightly worse than baseline. Please avoid use of Mobic and other NSAIDs. Repeat BMET in 30 days please.

## 2016-11-12 NOTE — Telephone Encounter (Signed)
Spoke with patient.  She takes meloxicam daily for her RA.  Encouraged her to try every other day if she felt she could not stop completely.  Also encouraged her to stay well hydrated.    Will mail lab order to have BMET repeated at end of February.   Patient voiced understanding.

## 2016-12-05 ENCOUNTER — Other Ambulatory Visit: Payer: Self-pay | Admitting: Internal Medicine

## 2016-12-11 ENCOUNTER — Ambulatory Visit: Payer: Medicare Other

## 2017-01-08 ENCOUNTER — Other Ambulatory Visit: Payer: Self-pay | Admitting: Internal Medicine

## 2017-01-08 NOTE — Telephone Encounter (Signed)
Last filled 10/2015 with 9 refills--please advise if pt is to continue

## 2017-01-13 NOTE — Telephone Encounter (Signed)
The Fenofibrate is working well for her. She is also taking Crestor. I think she needs the combination of the two medications to control her cholesterol.

## 2017-01-13 NOTE — Telephone Encounter (Signed)
Spoken to patient and she stated that she will pick up her potassium tablets.  Also patient wanted to ask [facility] regarding statin medication. Her cardiologist wants her stop taking fenofibrate (according to patient , the cardiologist stated that this should have not been the first option for cholesterol). Patient would like Regina's thoughts on this before making a change. Patient stated that she is not happy with cardiologist as well.

## 2017-01-14 NOTE — Telephone Encounter (Signed)
Spoke to pt and advised per RBaity 

## 2017-01-16 DIAGNOSIS — M5136 Other intervertebral disc degeneration, lumbar region: Secondary | ICD-10-CM | POA: Diagnosis not present

## 2017-01-16 DIAGNOSIS — M150 Primary generalized (osteo)arthritis: Secondary | ICD-10-CM | POA: Diagnosis not present

## 2017-01-16 DIAGNOSIS — Z79899 Other long term (current) drug therapy: Secondary | ICD-10-CM | POA: Diagnosis not present

## 2017-01-16 DIAGNOSIS — E782 Mixed hyperlipidemia: Secondary | ICD-10-CM | POA: Diagnosis not present

## 2017-01-16 DIAGNOSIS — M0589 Other rheumatoid arthritis with rheumatoid factor of multiple sites: Secondary | ICD-10-CM | POA: Diagnosis not present

## 2017-02-16 ENCOUNTER — Other Ambulatory Visit: Payer: Self-pay | Admitting: Internal Medicine

## 2017-04-28 DIAGNOSIS — Z79899 Other long term (current) drug therapy: Secondary | ICD-10-CM | POA: Diagnosis not present

## 2017-04-28 DIAGNOSIS — M5136 Other intervertebral disc degeneration, lumbar region: Secondary | ICD-10-CM | POA: Diagnosis not present

## 2017-04-28 DIAGNOSIS — M150 Primary generalized (osteo)arthritis: Secondary | ICD-10-CM | POA: Diagnosis not present

## 2017-04-28 DIAGNOSIS — M0589 Other rheumatoid arthritis with rheumatoid factor of multiple sites: Secondary | ICD-10-CM | POA: Diagnosis not present

## 2017-05-18 ENCOUNTER — Telehealth: Payer: Self-pay | Admitting: Internal Medicine

## 2017-05-18 NOTE — Telephone Encounter (Signed)
New message  Pt call requesting to switch providers from Dr. Debara Pickett to Dr. Tamala Julian. Would it be okay to make the switch?

## 2017-05-18 NOTE — Telephone Encounter (Signed)
She does not have any significant cardiac abnormalities that I can tell. She needs to stay put.

## 2017-05-19 NOTE — Telephone Encounter (Signed)
F/u Message  Pt call requesting to speak with RN. Pt called to switch providers; as Dr. Tamala Julian states he would not be able to take on pt. Pt has some concerns and would like to discuss with RN. Please call back to discuss

## 2017-05-19 NOTE — Telephone Encounter (Signed)
Tried to call patient and number not taking calls, unable to leave message

## 2017-05-19 NOTE — Telephone Encounter (Signed)
Returned the call back to the patient. This is her work number [phone]. She was out to lunch. Will try back later. No message was left.

## 2017-05-20 ENCOUNTER — Other Ambulatory Visit: Payer: Self-pay | Admitting: Internal Medicine

## 2017-05-20 NOTE — Telephone Encounter (Signed)
Spoke with patient and she stated she just didn't feel comfortable with Dr Debara Pickett. Advised Dr Tamala Julian was unable to see her at this time. She may call back with another request

## 2017-06-02 ENCOUNTER — Emergency Department (EMERGENCY_DEPARTMENT_HOSPITAL): Payer: Medicare HMO

## 2017-06-02 ENCOUNTER — Emergency Department
Admission: EM | Admit: 2017-06-02 | Discharge: 2017-06-02 | Disposition: A | Discharge status: Home / Self Care | Payer: Medicare HMO | Attending: Emergency Medicine | Admitting: Emergency Medicine

## 2017-06-02 ENCOUNTER — Emergency Department (HOSPITAL_BASED_OUTPATIENT_CLINIC_OR_DEPARTMENT_OTHER): Payer: Medicare HMO

## 2017-06-02 DIAGNOSIS — R42 Dizziness and giddiness: Secondary | ICD-10-CM | POA: Insufficient documentation

## 2017-06-02 DIAGNOSIS — I252 Old myocardial infarction: Secondary | ICD-10-CM | POA: Insufficient documentation

## 2017-06-02 DIAGNOSIS — F419 Anxiety disorder, unspecified: Secondary | ICD-10-CM | POA: Insufficient documentation

## 2017-06-02 DIAGNOSIS — I10 Essential (primary) hypertension: Secondary | ICD-10-CM | POA: Insufficient documentation

## 2017-06-02 DIAGNOSIS — M109 Gout, unspecified: Secondary | ICD-10-CM | POA: Insufficient documentation

## 2017-06-02 DIAGNOSIS — Z880 Allergy status to penicillin: Secondary | ICD-10-CM | POA: Insufficient documentation

## 2017-06-02 DIAGNOSIS — R079 Chest pain, unspecified: Secondary | ICD-10-CM

## 2017-06-02 DIAGNOSIS — Z6838 Body mass index (BMI) 38.0-38.9, adult: Secondary | ICD-10-CM | POA: Insufficient documentation

## 2017-06-02 DIAGNOSIS — Z7401 Bed confinement status: Secondary | ICD-10-CM | POA: Insufficient documentation

## 2017-06-02 DIAGNOSIS — R0789 Other chest pain: Secondary | ICD-10-CM

## 2017-06-02 DIAGNOSIS — K219 Gastro-esophageal reflux disease without esophagitis: Secondary | ICD-10-CM

## 2017-06-02 DIAGNOSIS — Z7982 Long term (current) use of aspirin: Secondary | ICD-10-CM | POA: Insufficient documentation

## 2017-06-02 DIAGNOSIS — I69354 Hemiplegia and hemiparesis following cerebral infarction affecting left non-dominant side: Secondary | ICD-10-CM | POA: Insufficient documentation

## 2017-06-02 DIAGNOSIS — E039 Hypothyroidism, unspecified: Secondary | ICD-10-CM | POA: Insufficient documentation

## 2017-06-02 DIAGNOSIS — I2510 Atherosclerotic heart disease of native coronary artery without angina pectoris: Secondary | ICD-10-CM

## 2017-06-02 LAB — ECG 12-LEAD
ATRIAL RATE: 67 {beats}/min
ATRIAL RATE: 73 {beats}/min
ATRIAL RATE: 80 {beats}/min
P AXIS: 42 degrees
P AXIS: 43 degrees
P AXIS: 55 degrees
PR INTERVAL: 148 ms
PR INTERVAL: 150 ms
PR INTERVAL: 152 ms
QRS INTERVAL/DURATION: 88 ms
QRS INTERVAL/DURATION: 88 ms
QRS INTERVAL/DURATION: 90 ms
QT: 422 ms
QT: 430 ms
QT: 436 ms
QTC INTERVAL: 460 ms
QTC INTERVAL: 473 ms
QTC INTERVAL: 486 ms
R AXIS: 0 degrees
R AXIS: 1 degrees
R AXIS: 4 degrees
T AXIS: 200 degrees
T AXIS: 201 degrees
T AXIS: 205 degrees
VENTRICULAR RATE: 67 {beats}/min
VENTRICULAR RATE: 73 {beats}/min
VENTRICULAR RATE: 80 {beats}/min

## 2017-06-02 LAB — CBC WITH DIFF, BLOOD
ANC-Automated: 3.8 10*3/uL (ref 1.6–7.0)
Abs Lymphs: 1.4 10*3/uL (ref 0.8–3.1)
Abs Monos: 0.4 10*3/uL (ref 0.2–0.8)
Eosinophils: 1 %
Hct: 40.5 % (ref 34.0–45.0)
Hgb: 13.2 gm/dL (ref 11.2–15.7)
Lymphocytes: 25 %
MCH: 32.1 pg — ABNORMAL HIGH (ref 26.0–32.0)
MCHC: 32.6 g/dL (ref 32.0–36.0)
MCV: 98.5 um3 — ABNORMAL HIGH (ref 79.0–95.0)
MPV: 11.4 fL (ref 9.4–12.4)
Monocytes: 6 %
Plt Count: 201 10*3/uL (ref 140–370)
RBC: 4.11 10*6/uL (ref 3.90–5.20)
RDW: 14.4 % — ABNORMAL HIGH (ref 12.0–14.0)
Segs: 67 %
WBC: 5.7 10*3/uL (ref 4.0–10.0)

## 2017-06-02 LAB — APTT, BLOOD: PTT: 33 s (ref 25–34)

## 2017-06-02 LAB — COMPREHENSIVE METABOLIC PANEL, BLOOD
ALT (SGPT): 9 U/L (ref 0–33)
AST (SGOT): 14 U/L (ref 0–32)
Albumin: 3.6 g/dL (ref 3.5–5.2)
Alkaline Phos: 84 U/L (ref 35–140)
Anion Gap: 11 mmol/L (ref 7–15)
BUN: 13 mg/dL (ref 8–23)
Bicarbonate: 28 mmol/L (ref 22–29)
Bilirubin, Tot: 0.24 mg/dL (ref <1.2)
Calcium: 9.3 mg/dL (ref 8.5–10.6)
Chloride: 101 mmol/L (ref 98–107)
Creatinine: 0.5 mg/dL — ABNORMAL LOW (ref 0.51–0.95)
GFR: >60 mL/min
Glucose: 114 mg/dL — ABNORMAL HIGH (ref 70–99)
Potassium: 3.7 mmol/L (ref 3.5–5.1)
Sodium: 140 mmol/L (ref 136–145)
Total Protein: 8.4 g/dL — ABNORMAL HIGH (ref 6.0–8.0)

## 2017-06-02 LAB — PROTHROMBIN TIME, BLOOD
INR: 1.1
PT,Patient: 12.2 s (ref 9.7–12.5)

## 2017-06-02 LAB — TROPONIN T GEN 5 W/REFLEX TO CK/CKMB
Troponin T Gen 5 w/Reflex CK/CKMB: 12 ng/L (ref <14)
Troponin T Gen 5 w/Reflex CK/CKMB: 12 ng/L (ref <14)

## 2017-06-02 LAB — MAGNESIUM, BLOOD: Magnesium: 2 mg/dL (ref 1.6–2.4)

## 2017-06-02 LAB — D-DIMER HIGHLY SENSITIVE, BLOOD: D-Dimer HS: 461 ng/mL D-DU — ABNORMAL HIGH (ref <241)

## 2017-06-02 LAB — LIPASE, BLOOD: Lipase: 30 U/L (ref 13–60)

## 2017-06-02 LAB — PHOSPHORUS, BLOOD: Phosphorous: 3.8 mg/dL (ref 2.7–4.5)

## 2017-06-02 LAB — TSH, BLOOD: TSH: 8.26 u[IU]/mL — ABNORMAL HIGH (ref 0.27–4.20)

## 2017-06-02 MED ORDER — NITROGLYCERIN 0.4 MG SL SUBL
0.4000 mg | SUBLINGUAL_TABLET | SUBLINGUAL | Status: DC | PRN
Start: 2017-06-02 — End: 2017-06-02

## 2017-06-02 MED ORDER — IOHEXOL 350 MG/ML IV SOLN
120.0000 mL | Freq: Once | INTRAVENOUS | Status: AC
Start: 2017-06-02 — End: 2017-06-02
  Administered 2017-06-02: 120 mL via INTRAVENOUS
  Filled 2017-06-02: qty 120

## 2017-06-02 MED ORDER — MECLIZINE HCL 25 MG OR TABS
25.0000 mg | ORAL_TABLET | Freq: Once | ORAL | Status: AC
Start: 2017-06-02 — End: 2017-06-02
  Administered 2017-06-02: 25 mg via ORAL
  Filled 2017-06-02: qty 1

## 2017-06-02 MED ORDER — FAMOTIDINE IN NACL 20 MG/50ML IV SOLN
20.0000 mg | Freq: Once | INTRAVENOUS | Status: AC
Start: 2017-06-02 — End: 2017-06-02
  Administered 2017-06-02: 20 mg via INTRAVENOUS
  Filled 2017-06-02: qty 50

## 2017-06-02 MED ORDER — ALUM & MAG HYDROXIDE-SIMETH 200-200-20 MG/5ML OR SUSP
30.0000 mL | Freq: Once | ORAL | Status: AC
Start: 2017-06-02 — End: 2017-06-02
  Administered 2017-06-02: 30 mL via ORAL
  Filled 2017-06-02: qty 30

## 2017-06-02 MED ORDER — OMEPRAZOLE 20 MG OR TBEC
20.0000 mg | DELAYED_RELEASE_TABLET | Freq: Every day | ORAL | 0 refills | Status: DC
Start: 2017-06-02 — End: 2017-07-27

## 2017-06-02 MED ORDER — LIDOCAINE VISCOUS 2 % MT SOLN
10.0000 mL | Freq: Once | OROMUCOSAL | Status: AC
Start: 2017-06-02 — End: 2017-06-02
  Administered 2017-06-02: 10 mL via ORAL
  Filled 2017-06-02: qty 15

## 2017-06-02 MED ORDER — ONDANSETRON 4 MG OR TBDP
8.0000 mg | ORAL_TABLET | Freq: Once | ORAL | Status: AC
Start: 2017-06-02 — End: 2017-06-02
  Administered 2017-06-02: 8 mg via ORAL
  Filled 2017-06-02: qty 2

## 2017-06-02 MED ORDER — HYDROCODONE-ACETAMINOPHEN 5-325 MG OR TABS
1.0000 | ORAL_TABLET | Freq: Once | ORAL | Status: AC
Start: 2017-06-02 — End: 2017-06-02
  Administered 2017-06-02: 1 via ORAL
  Filled 2017-06-02: qty 1

## 2017-06-02 MED ORDER — PB-HYOSCY-ATROPINE-SCOPOLAMINE 16.2 MG/5ML PO ELIX
10.0000 mL | ORAL_SOLUTION | Freq: Once | ORAL | Status: AC
Start: 2017-06-02 — End: 2017-06-02
  Administered 2017-06-02: 10 mL via ORAL
  Filled 2017-06-02: qty 10

## 2017-06-02 NOTE — ED Notes (Signed)
Dr Singh at bedside

## 2017-06-02 NOTE — Discharge Instructions (Signed)
Chest Pain of Unclear Etiology     You have been seen for chest pain. The cause of your pain is not yet known.     Your doctor has learned about your medical history, examined you, and checked any tests that were done. Still, it is unclear why you are having pain. The doctor thinks there is only a very small chance that your pain is caused by a life-threatening condition. Later, your primary care doctor might do more tests or check you again.     Sometimes chest pain is caused by a dangerous condition, like a heart attack, aorta injury, blood clot in the lung, or collapsed lung. It is unlikely that your pain is caused by a life-threatening condition if: Your chest pain lasts only a few seconds at a time; you are not short of breath, nauseated (sick to your stomach), sweaty, or lightheaded; your pain gets worse when you twist or bend; your pain improves with exercise or hard work.     Chest pain is serious. It is VERY IMPORTANT that you follow up with your regular doctor and seek medical attention immediately here or at the nearest Emergency Department if your symptoms become worse or they change.     YOU SHOULD SEEK MEDICAL ATTENTION IMMEDIATELY, EITHER HERE OR AT THE NEAREST EMERGENCY DEPARTMENT, IF ANY OF THE FOLLOWING OCCURS:  · Your pain gets worse.  · Your pain makes you short of breath, nauseated, or sweaty.  · Your pain gets worse when you walk, go up stairs, or exert yourself.  · You feel weak, lightheaded, or faint.  · It hurts to breathe.  · Your leg swells.  · Your symptoms get worse or you have new symptoms or concerns.

## 2017-06-02 NOTE — ED Provider Notes (Signed)
Emergency Department Provider Note    Patient: Emily Ball, MRN [id number], DOB [birth year]  The Date of Service for the Emergency Room encounter is 06/02/2017  2:05 AM        Chief Complaint   Patient presents with    Chest Pain - Adult     BIBM from home substernal chest pressure x6 hrs constant, Treatment ASA 324, 1 inch nitro paste,  nitro 0.4mg  SLx2, pain unrelieved. 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  Patient also endorses 2 weeks of dizziness when she turns her head left and right, this dizziness worsened when her substernal chest pain started.  Denies loss of consciousness, headache, fevers.  Denies changes in vision or photophobia or neck pain. Denies cough, palpitations, shortness of breath or dyspnea.    No other medical complaints, see additional ROS below.     Denies fevers/nausea/vomiting/convulsions/LOC  Denies headache/changes in vision/changes in hearing/dysphagia/odynophagia  Denies SOB/dyspnea/chest pain/palpitations/cough  Denies abdominal pain/diarrhea/constipation/hematochezia/melena  Denies hematuria/flank pain/dysuria  Denies numbness/tingling/swelling of extremities  Denies new rash  Denies drug use     ROS:  All other 10-point systems reviewed and negative unless otherwise noted in the HPI or above. This was done per my custom and practice for systems appropriate to the chief complaint in an emergency department setting and varies depending on the quality of history that the  patient is able to provide.    Patient's medical history has been reviewed today as available in EPIC chart.  Primary MD: No primary care provider on file.    Home Medications:  Levothyroxine  Lorazepam  ASA 81mg        Allergies:    Pcn [penicillins]           Past Medical/Surgical History:   CVA w/ left sided deficits  Hypothyroidism  Bed bound  Anemia  Gout  MI x 3  Anxiety  Morbid obesity    Social History: Denies alcohol, tobacco, or illicit drug use. Housed     Physical Exam   Vitals        Vitals:    06/02/17 0215   BP: 142/87   Pulse: 82   Resp: 18   Temp: 98.3 F (36.8 C)   SpO2: 98%   Weight: 113.4 kg (250 lb)   Height: 5\' 8"  (1.727 m)        Vital signs reviewed and noted: afebrile, hemodynamically stable   Gen: Patient is in no acute distress, well-groomed, alert, non-toxic appearing, coopertive  HEENT: NC/AT. PERRL. EOM intact. No facial asymmetry noted. No appreciable nystagmus. No scleral icterus, No rhinorrhea. Moist mucus membranes.    Neck: Supple. FROM. +stoma, nonerythematous. No paraspinal or point c-spine tenderness. No meningeal signs. No appreciable JVD. No anterior or posterior cervical LAD. No carotid bruits bilaterally.   Pulm: CTA anteriorly. Symmetric air entry bilaterally. No wheezes/rales/rhonchi. Good inspiratory effort. No stridor.   Cardiac: S1S2 RRR. No murmurs/gallops/rubs.   Abd: Soft, obese, nontender, nondistended. Normoactive bowel sounds. No guarding/rebound. No palpable  organomegaly. No pulsatile abdominal mass noted   Extr/MSK: Warm, well perfused, distal pulses 2+ upper and lower extremities, symmetric. Bilateral 2+ pitting edema. No calf tenderness. FROM.  Skin: Warm, dry, no appreciable cyanosis.    Neuro: Awake, alert, and oriented to person place and time. CN II-XII grossly intact. No dysarthria. Strength 5/5 R upper extremity, 0/5 L upper extremity, and 2/5 R lower extremity, 0/5 LLE,  Nonambulatory at baseline  Psych: Appropriate. Pleasant affect, conversing  normally, thoughts linear.     Labs  Pending    Diagnostic Studies  Pending    EKG Interpretation  EKG Interpretation: Rate 80, sinus with PAC's, T wave inversions in I, II, V2-    Assessment & Plan  Pt is a 66 year old y/o female with history as above who presents with 2 approximately 12 hours of gradually worsening substernal and epigastric chest pain and burning.  States this is accompanied by symptoms of vertigo when she turns her head left and right, has been going on for 2 weeks, however this dizziness has worsened today with the substernal burning.  Patient is bed-bound at baseline and does not ambulate.  She is otherwise hemodynamically stable, has a pleasant affect and is conversing normally.  Discussed with patient her DNR DNI status which she confirmed.  Discussed with patient her Jehovah's Witness status and confirmed with her that she does not want blood products.    Physical exam was unremarkable except for morbid obesity and bed-bound state.  Neuro exam was consistent with left-sided neuro deficits per her baseline.  No facial asymmetry noted. Chest exam was unremarkable anteriorly.    Differential is broad at this time includes MI, PE, pneumothorax, GERD.  Patient is high risk for PE given her bed-bound state and that she is not anticoagulated. Symptoms of dizziness associated with chest pain also concerning for PE at this time. Patient is also high risk for MI given her age, ECG shows signs concerning for lateral ischemia, and T wave inversions in I and II; will repeat in 58min. No prior ECG's to compare.     Patient's case is complicated and will require shared-decision making at every step. Decision was made with patient not to activate cath lab at this time.     Plan:   - f/u labs, trop, d-dimer  - f/u CXR  - pain control, nitro, GI cocktail  - meclizine      Dispo: pending above    Lab results were interpreted and discussed with ED attending. Full ED course and patient progress may be  reported in subsequent progress notes. Please review.     Orders Placed This Encounter   Procedures    X-Ray Chest Frontal And Lateral    Comprehensive Metabolic Panel Green    Magnesium, Blood Green Plasma Separator Tube    Phosphorus, Blood Green Plasma Separator Tube    Troponin T Gen 5 w/Reflex to CK/CKMB Green Plasma Separator Tube    CBC w/ Diff Lavender    TSH, Blood - See Instructions    Lipase, Blood Green Plasma Separator Tube    ECG 12 Lead       Patient seen and discussed with ED attending, Bubba Camp, MD.   Marjory Lies, MD  Emergency Medicine PGY-2       Marjory Lies, MD  Resident  06/02/17 0309       Bubba Camp, MD  06/02/17 1351

## 2017-06-02 NOTE — ED Notes (Signed)
Pt cathed for urine under sterile technique, tolerated procedure well, urine dipped

## 2017-06-02 NOTE — ED Notes (Signed)
Pt to Xray.

## 2017-06-02 NOTE — ED Notes (Signed)
Pt provided c meal tray, awaiting BLS transport

## 2017-06-02 NOTE — ED Notes (Signed)
BLS transported pt back home, in nad, VSS

## 2017-06-02 NOTE — ED MD Progress Note (Addendum)
Workup Review     Labs  Results for orders placed or performed during the hospital encounter of 06/02/17   Comprehensive Metabolic Panel Green   Result Value Ref Range    Glucose 114 (H) 70 - 99 mg/dL    BUN 13 8 - 23 mg/dL    Creatinine 0.50 (L) 0.51 - 0.95 mg/dL    GFR >60 mL/min    Sodium 140 136 - 145 mmol/L    Potassium 3.7 3.5 - 5.1 mmol/L    Chloride 101 98 - 107 mmol/L    Bicarbonate 28 22 - 29 mmol/L    Anion Gap 11 7 - 15 mmol/L    Calcium 9.3 8.5 - 10.6 mg/dL    Total Protein 8.4 (H) 6.0 - 8.0 g/dL    Albumin 3.6 3.5 - 5.2 g/dL    Bilirubin, Tot 0.24 <1.2 mg/dL    AST (SGOT) 14 0 - 32 U/L    ALT (SGPT) 9 0 - 33 U/L    Alkaline Phos 84 35 - 140 U/L   Magnesium, Blood Green Plasma Separator Tube   Result Value Ref Range    Magnesium 2.0 1.6 - 2.4 mg/dL   Phosphorus, Blood Green Plasma Separator Tube   Result Value Ref Range    Phosphorous 3.8 2.7 - 4.5 mg/dL   Troponin T Gen 5 w/Reflex to CK/CKMB Green Plasma Separator Tube   Result Value Ref Range    Troponin T Gen 5 w/Reflex CK/CKMB 12 <14 ng/L   CBC w/ Diff Lavender   Result Value Ref Range    WBC 5.7 4.0 - 10.0 1000/mm3    RBC 4.11 3.90 - 5.20 mill/mm3    Hgb 13.2 11.2 - 15.7 gm/dL    Hct 40.5 34.0 - 45.0 %    MCV 98.5 (H) 79.0 - 95.0 um3    MCH 32.1 (H) 26.0 - 32.0 pgm    MCHC 32.6 32.0 - 36.0 g/dL    RDW 14.4 (H) 12.0 - 14.0 %    MPV 11.4 9.4 - 12.4 fL    Plt Count 201 140 - 370 1000/mm3    Segs 67 %    Lymphocytes 25 %    Monocytes 6 %    Eosinophils 1 %    ANC-Automated 3.8 1.6 - 7.0 1000/mm3    Abs Lymphs 1.4 0.8 - 3.1 1000/mm3    Abs Monos 0.4 0.2 - 0.8 1000/mm3    Diff Type Automated    TSH, Blood - See Instructions   Result Value Ref Range    TSH 8.26 (H) 0.27 - 4.20 uIU/mL   Lipase, Blood Green Plasma Separator Tube   Result Value Ref Range    Lipase 30 13 - 60 U/L   D-Dimer Highly Sensitive, Blood   Result Value Ref Range    D-Dimer HS 461 (H) <241 ng/mL D-DU   aPTT, Blood   Result Value Ref Range    PTT 33 25 - 34 sec   Prothrombin Time,  Blood   Result Value Ref Range    PT,Patient 12.2 9.7 - 12.5 sec    INR 1.1    Troponin T Gen 5 w/Reflex to CK/CKMB Green Plasma Separator Tube   Result Value Ref Range    Troponin T Gen 5 w/Reflex CK/CKMB 12 <14 ng/L       Diagnostic Studies  CT PE pending      ED Course & Medical Decision Making  - a copy of patient's prior EKG from  February 2015 was obtained from [facility] (see photo below) and was unchanged from today's EKG.  Low suspicion for acute MI at this time.  The ST depressions are the same from prior.  First troponin and repeat troponin were both 12 and unchanged.  Patient D-dimer returned at 461, which is elevated when adjusted for age.  Patient continues to be hypertensive.  However her chest pain has resolved with the GI cocktail.    Plan:   - f/u CT PE study. If negative, patient may dc home. If positive, have discussion of care goals with patient.

## 2017-06-02 NOTE — ED Notes (Signed)
BIBM from home, for chest pressure with dizziness/SOB

## 2017-06-02 NOTE — ED Notes (Addendum)
Pt resting in gurney. Calm and comfortable, resp even and unlabored, skins pink, warm and dry. Alert and oriented x4. On full cardiac monitor NSR. Denies cp/sob./abd pain. Pending CT, pt updated on plan of care. Has call light. Verified pt has caregiver at home to receive pt for safe dc.

## 2017-06-02 NOTE — ED MD Progress Note (Signed)
Workup Review     SIGN OUT NOTE: This is a 66 year old female with cp, substernal, likely dc.  WU showed ischemic ekg but unchanged and stable trops.  Ddimer elev, awaiting CT read.  Felt better after GI cocktail.      BP (!) 162/102   Pulse 75   Temp 98.3 F (36.8 C)   Resp 20   Ht 5\' 8"  (1.727 m)   Wt 113.4 kg (250 lb)   SpO2 97%   BMI 38.01 kg/m2  Labs Reviewed   COMPREHENSIVE METABOLIC PANEL, BLOOD - Abnormal; Notable for the following:        Result Value    Glucose 114 (*)     Creatinine 0.50 (*)     Total Protein 8.4 (*)     All other components within normal limits   CBC WITH DIFF, BLOOD - Abnormal; Notable for the following:     MCV 98.5 (*)     MCH 32.1 (*)     RDW 14.4 (*)     All other components within normal limits   TSH, BLOOD - Abnormal; Notable for the following:     TSH 8.26 (*)     All other components within normal limits   D-DIMER HIGHLY SENSITIVE, BLOOD - Abnormal; Notable for the following:     D-Dimer HS 461 (*)     All other components within normal limits   MAGNESIUM, BLOOD   PHOSPHORUS, BLOOD   TROPONIN T GEN 5 W/REFLEX TO CK/CKMB   LIPASE, BLOOD   APTT, BLOOD   PROTHROMBIN TIME, BLOOD   TROPONIN T GEN 5 W/REFLEX TO CK/CKMB    Narrative:     Repeat trop for trending. Thank you!

## 2017-06-02 NOTE — ED Notes (Signed)
Pt to CT

## 2017-06-02 NOTE — ED Notes (Signed)
Bed: 11A  Expected date:   Expected time:   Means of arrival:   Comments:  medic

## 2017-06-02 NOTE — ED Notes (Signed)
Pt to ED from home for CP x6 hours described as pressure substernal non radiating monopolized. Pt hx CVA with left sided residual, LUE LLE flaccid with diminished sensation. Reports SOB/dizziness following episodes of CP described as "head spinning". +nausea, no vomiting or diarrhea.     Sinus rhythm on monitor with inverted T-waves and occasional PVCs. Alarms audible.

## 2017-06-02 NOTE — ED Notes (Signed)
06/02/2017 2:42 AM Normand Sloop    An EKG was handed to Dr. Candiss Norse. No prior EKG. A copy placed in binder.

## 2017-06-02 NOTE — ED Notes (Signed)
Repeat EKG given to Dr. Theola Sequin. Pt denies any CP at this time. Nitro held at this time

## 2017-06-24 ENCOUNTER — Other Ambulatory Visit: Payer: Self-pay | Admitting: Internal Medicine

## 2017-07-21 DIAGNOSIS — Z79899 Other long term (current) drug therapy: Secondary | ICD-10-CM | POA: Diagnosis not present

## 2017-07-21 DIAGNOSIS — M5136 Other intervertebral disc degeneration, lumbar region: Secondary | ICD-10-CM | POA: Diagnosis not present

## 2017-07-21 DIAGNOSIS — Z6830 Body mass index (BMI) 30.0-30.9, adult: Secondary | ICD-10-CM | POA: Diagnosis not present

## 2017-07-21 DIAGNOSIS — M0589 Other rheumatoid arthritis with rheumatoid factor of multiple sites: Secondary | ICD-10-CM | POA: Diagnosis not present

## 2017-07-24 ENCOUNTER — Emergency Department (HOSPITAL_COMMUNITY): Payer: Medicare HMO

## 2017-07-24 ENCOUNTER — Encounter (HOSPITAL_COMMUNITY): Payer: Self-pay | Admitting: Emergency Medicine

## 2017-07-24 ENCOUNTER — Inpatient Hospital Stay (HOSPITAL_COMMUNITY): Payer: Medicare HMO

## 2017-07-24 ENCOUNTER — Inpatient Hospital Stay
Admission: EM | Admit: 2017-07-24 | Discharge: 2017-07-28 | DRG: 872 | Disposition: A | Discharge status: Home / Self Care | Payer: Medicare HMO | Attending: Emergency Medicine | Admitting: Emergency Medicine

## 2017-07-24 DIAGNOSIS — Z9079 Acquired absence of other genital organ(s): Secondary | ICD-10-CM

## 2017-07-24 DIAGNOSIS — R1310 Dysphagia, unspecified: Secondary | ICD-10-CM | POA: Diagnosis present

## 2017-07-24 DIAGNOSIS — I69354 Hemiplegia and hemiparesis following cerebral infarction affecting left non-dominant side: Secondary | ICD-10-CM

## 2017-07-24 DIAGNOSIS — R110 Nausea: Secondary | ICD-10-CM

## 2017-07-24 DIAGNOSIS — K8030 Calculus of bile duct with cholangitis, unspecified, without obstruction: Secondary | ICD-10-CM | POA: Diagnosis present

## 2017-07-24 DIAGNOSIS — R1110 Vomiting, unspecified: Secondary | ICD-10-CM | POA: Diagnosis present

## 2017-07-24 DIAGNOSIS — R109 Unspecified abdominal pain: Secondary | ICD-10-CM

## 2017-07-24 DIAGNOSIS — Z923 Personal history of irradiation: Secondary | ICD-10-CM

## 2017-07-24 DIAGNOSIS — R1013 Epigastric pain: Secondary | ICD-10-CM

## 2017-07-24 DIAGNOSIS — F419 Anxiety disorder, unspecified: Secondary | ICD-10-CM | POA: Diagnosis present

## 2017-07-24 DIAGNOSIS — Z91018 Allergy to other foods: Secondary | ICD-10-CM

## 2017-07-24 DIAGNOSIS — G4733 Obstructive sleep apnea (adult) (pediatric): Secondary | ICD-10-CM | POA: Diagnosis present

## 2017-07-24 DIAGNOSIS — Z6841 Body Mass Index (BMI) 40.0 and over, adult: Secondary | ICD-10-CM

## 2017-07-24 DIAGNOSIS — I2510 Atherosclerotic heart disease of native coronary artery without angina pectoris: Secondary | ICD-10-CM | POA: Diagnosis present

## 2017-07-24 DIAGNOSIS — R160 Hepatomegaly, not elsewhere classified: Secondary | ICD-10-CM | POA: Diagnosis present

## 2017-07-24 DIAGNOSIS — K8042 Calculus of bile duct with acute cholecystitis without obstruction: Secondary | ICD-10-CM | POA: Diagnosis present

## 2017-07-24 DIAGNOSIS — I493 Ventricular premature depolarization: Secondary | ICD-10-CM | POA: Diagnosis present

## 2017-07-24 DIAGNOSIS — K8000 Calculus of gallbladder with acute cholecystitis without obstruction: Secondary | ICD-10-CM

## 2017-07-24 DIAGNOSIS — Z66 Do not resuscitate: Secondary | ICD-10-CM | POA: Diagnosis present

## 2017-07-24 DIAGNOSIS — E039 Hypothyroidism, unspecified: Secondary | ICD-10-CM | POA: Diagnosis present

## 2017-07-24 DIAGNOSIS — A419 Sepsis, unspecified organism: Principal | ICD-10-CM | POA: Diagnosis present

## 2017-07-24 DIAGNOSIS — Z90710 Acquired absence of both cervix and uterus: Secondary | ICD-10-CM

## 2017-07-24 DIAGNOSIS — K8020 Calculus of gallbladder without cholecystitis without obstruction: Secondary | ICD-10-CM

## 2017-07-24 DIAGNOSIS — G629 Polyneuropathy, unspecified: Secondary | ICD-10-CM | POA: Diagnosis present

## 2017-07-24 DIAGNOSIS — Z880 Allergy status to penicillin: Secondary | ICD-10-CM

## 2017-07-24 DIAGNOSIS — K8309 Other cholangitis: Secondary | ICD-10-CM

## 2017-07-24 DIAGNOSIS — F329 Major depressive disorder, single episode, unspecified: Secondary | ICD-10-CM | POA: Diagnosis present

## 2017-07-24 DIAGNOSIS — R932 Abnormal findings on diagnostic imaging of liver and biliary tract: Secondary | ICD-10-CM

## 2017-07-24 DIAGNOSIS — K8050 Calculus of bile duct without cholangitis or cholecystitis without obstruction: Secondary | ICD-10-CM

## 2017-07-24 DIAGNOSIS — I10 Essential (primary) hypertension: Secondary | ICD-10-CM | POA: Diagnosis present

## 2017-07-24 DIAGNOSIS — Z8542 Personal history of malignant neoplasm of other parts of uterus: Secondary | ICD-10-CM

## 2017-07-24 DIAGNOSIS — R1114 Bilious vomiting: Secondary | ICD-10-CM

## 2017-07-24 DIAGNOSIS — E876 Hypokalemia: Secondary | ICD-10-CM | POA: Diagnosis present

## 2017-07-24 DIAGNOSIS — Z955 Presence of coronary angioplasty implant and graft: Secondary | ICD-10-CM

## 2017-07-24 DIAGNOSIS — R1011 Right upper quadrant pain: Secondary | ICD-10-CM

## 2017-07-24 DIAGNOSIS — K5730 Diverticulosis of large intestine without perforation or abscess without bleeding: Secondary | ICD-10-CM | POA: Diagnosis present

## 2017-07-24 DIAGNOSIS — Q631 Lobulated, fused and horseshoe kidney: Secondary | ICD-10-CM

## 2017-07-24 DIAGNOSIS — Z7401 Bed confinement status: Secondary | ICD-10-CM

## 2017-07-24 DIAGNOSIS — Z888 Allergy status to other drugs, medicaments and biological substances status: Secondary | ICD-10-CM

## 2017-07-24 DIAGNOSIS — R1012 Left upper quadrant pain: Secondary | ICD-10-CM

## 2017-07-24 DIAGNOSIS — R945 Abnormal results of liver function studies: Secondary | ICD-10-CM

## 2017-07-24 DIAGNOSIS — D509 Iron deficiency anemia, unspecified: Secondary | ICD-10-CM | POA: Diagnosis present

## 2017-07-24 DIAGNOSIS — K8010 Calculus of gallbladder with chronic cholecystitis without obstruction: Secondary | ICD-10-CM

## 2017-07-24 DIAGNOSIS — I252 Old myocardial infarction: Secondary | ICD-10-CM

## 2017-07-24 DIAGNOSIS — C55 Malignant neoplasm of uterus, part unspecified: Secondary | ICD-10-CM

## 2017-07-24 DIAGNOSIS — H409 Unspecified glaucoma: Secondary | ICD-10-CM | POA: Diagnosis present

## 2017-07-24 DIAGNOSIS — Z882 Allergy status to sulfonamides status: Secondary | ICD-10-CM

## 2017-07-24 DIAGNOSIS — Z90722 Acquired absence of ovaries, bilateral: Secondary | ICD-10-CM

## 2017-07-24 DIAGNOSIS — K219 Gastro-esophageal reflux disease without esophagitis: Secondary | ICD-10-CM | POA: Diagnosis present

## 2017-07-24 DIAGNOSIS — K819 Cholecystitis, unspecified: Secondary | ICD-10-CM

## 2017-07-24 HISTORY — DX: Muscle weakness (generalized): M62.81

## 2017-07-24 HISTORY — DX: Obesity, unspecified: E66.9

## 2017-07-24 HISTORY — DX: Malignant neoplasm of uterus, part unspecified (CMS-HCC): C55

## 2017-07-24 HISTORY — DX: Cerebral infarction, unspecified (CMS-HCC): I63.9

## 2017-07-24 LAB — COMPREHENSIVE METABOLIC PANEL, BLOOD
ALT (SGPT): 209 U/L — ABNORMAL HIGH (ref 0–33)
ALT (SGPT): 208 U/L — ABNORMAL HIGH (ref 0–33)
AST (SGOT): 379 U/L — ABNORMAL HIGH (ref 0–32)
AST (SGOT): 247 U/L — ABNORMAL HIGH (ref 0–32)
Albumin: 3.2 g/dL — ABNORMAL LOW (ref 3.5–5.2)
Albumin: 2.7 g/dL — ABNORMAL LOW (ref 3.5–5.2)
Alkaline Phos: 226 U/L — ABNORMAL HIGH (ref 35–140)
Alkaline Phos: 204 U/L — ABNORMAL HIGH (ref 35–140)
Anion Gap: 10 mmol/L (ref 7–15)
Anion Gap: 10 mmol/L (ref 7–15)
BUN: 7 mg/dL — ABNORMAL LOW (ref 8–23)
BUN: 5 mg/dL — ABNORMAL LOW (ref 8–23)
Bicarbonate: 29 mmol/L (ref 22–29)
Bicarbonate: 26 mmol/L (ref 22–29)
Bilirubin, Tot: 1.66 mg/dL — ABNORMAL HIGH (ref <1.2)
Bilirubin, Tot: 1.79 mg/dL — ABNORMAL HIGH (ref <1.2)
Calcium: 8.9 mg/dL (ref 8.5–10.6)
Calcium: 6.8 mg/dL — ABNORMAL LOW (ref 8.5–10.6)
Chloride: 101 mmol/L (ref 98–107)
Chloride: 107 mmol/L (ref 98–107)
Creatinine: 0.45 mg/dL — ABNORMAL LOW (ref 0.51–0.95)
Creatinine: 0.4 mg/dL — ABNORMAL LOW (ref 0.51–0.95)
GFR: >60 mL/min
GFR: >60 mL/min
Glucose: 156 mg/dL — ABNORMAL HIGH (ref 70–99)
Glucose: 73 mg/dL (ref 70–99)
Potassium: 2.9 mmol/L — ABNORMAL LOW (ref 3.5–5.1)
Potassium: 2.5 mmol/L — CL (ref 3.5–5.1)
Sodium: 140 mmol/L (ref 136–145)
Sodium: 143 mmol/L (ref 136–145)
Total Protein: 8.3 g/dL — ABNORMAL HIGH (ref 6.0–8.0)
Total Protein: 6.3 g/dL (ref 6.0–8.0)

## 2017-07-24 LAB — ECG 12-LEAD
ATRIAL RATE: 62 {beats}/min
ATRIAL RATE: 56 {beats}/min
ATRIAL RATE: 76 {beats}/min
ATRIAL RATE: 78 {beats}/min
ATRIAL RATE: 78 {beats}/min
ECG INTERPRETATION: NORMAL
P AXIS: 63 degrees
P AXIS: 34 degrees
P AXIS: 38 degrees
P AXIS: 55 degrees
P AXIS: 58 degrees
PR INTERVAL: 182 ms
PR INTERVAL: 172 ms
PR INTERVAL: 174 ms
PR INTERVAL: 174 ms
PR INTERVAL: 176 ms
QRS INTERVAL/DURATION: 100 ms
QRS INTERVAL/DURATION: 90 ms
QRS INTERVAL/DURATION: 94 ms
QRS INTERVAL/DURATION: 98 ms
QRS INTERVAL/DURATION: 98 ms
QT: 474 ms
QT: 462 ms
QT: 468 ms
QT: 470 ms
QT: 480 ms
QTC INTERVAL: 481 ms
QTC INTERVAL: 463 ms
QTC INTERVAL: 519 ms
QTC INTERVAL: 533 ms
QTC INTERVAL: 535 ms
R AXIS: -8 degrees
R AXIS: -1 degrees
R AXIS: 1 degrees
R AXIS: 2 degrees
R AXIS: 5 degrees
T AXIS: 191 degrees
T AXIS: 154 degrees
T AXIS: 154 degrees
T AXIS: 165 degrees
T AXIS: 170 degrees
VENTRICULAR RATE: 62 {beats}/min
VENTRICULAR RATE: 56 {beats}/min
VENTRICULAR RATE: 76 {beats}/min
VENTRICULAR RATE: 78 {beats}/min
VENTRICULAR RATE: 78 {beats}/min

## 2017-07-24 LAB — CBC WITH DIFF, BLOOD
ANC-Automated: 4.1 10*3/uL (ref 1.6–7.0)
Abs Lymphs: 1.7 10*3/uL (ref 0.8–3.1)
Abs Monos: 0.4 10*3/uL (ref 0.2–0.8)
Eosinophils: 1 %
Hct: 41.8 % (ref 34.0–45.0)
Hgb: 13.1 gm/dL (ref 11.2–15.7)
Lymphocytes: 28 %
MCH: 30.8 pg (ref 26.0–32.0)
MCHC: 31.3 g/dL — ABNORMAL LOW (ref 32.0–36.0)
MCV: 98.4 um3 — ABNORMAL HIGH (ref 79.0–95.0)
MPV: 11 fL (ref 9.4–12.4)
Monocytes: 6 %
Plt Count: 194 10*3/uL (ref 140–370)
RBC: 4.25 10*6/uL (ref 3.90–5.20)
RDW: 14.8 % — ABNORMAL HIGH (ref 12.0–14.0)
Segs: 66 %
WBC: 6.3 10*3/uL (ref 4.0–10.0)

## 2017-07-24 LAB — LIPASE, BLOOD: Lipase: 18 U/L (ref 13–60)

## 2017-07-24 LAB — MAGNESIUM, BLOOD: Magnesium: 1.9 mg/dL (ref 1.6–2.4)

## 2017-07-24 LAB — PHOSPHORUS, BLOOD: Phosphorous: 3.1 mg/dL (ref 2.7–4.5)

## 2017-07-24 MED ORDER — GADOBUTROL 1 MMOL/ML IV SOLN
10.0000 mL | Freq: Once | INTRAVENOUS | Status: AC
Start: 2017-07-24 — End: 2017-07-24
  Administered 2017-07-24: 10 mL via INTRAVENOUS
  Filled 2017-07-24: qty 10

## 2017-07-24 MED ORDER — ONDANSETRON HCL 4 MG/2ML IV SOLN
4.0000 mg | Freq: Once | INTRAMUSCULAR | Status: AC
Start: 2017-07-24 — End: 2017-07-24
  Administered 2017-07-24: 4 mg via INTRAVENOUS
  Filled 2017-07-24: qty 2

## 2017-07-24 MED ORDER — MAGNESIUM SULFATE IN D5W 10-5 MG/ML-% IV SOLN
1.0000 g | Freq: Once | INTRAVENOUS | Status: AC
Start: 2017-07-24 — End: 2017-07-24
  Administered 2017-07-24: 1 g via INTRAVENOUS
  Filled 2017-07-24: qty 100

## 2017-07-24 MED ORDER — NALOXONE HCL 0.4 MG/ML IJ SOLN
0.1000 mg | INTRAMUSCULAR | Status: DC | PRN
Start: 2017-07-24 — End: 2017-07-28

## 2017-07-24 MED ORDER — MORPHINE SULFATE 2 MG/ML IJ SOLN
INTRAMUSCULAR | Status: DC
Start: 2017-07-24 — End: 2017-07-24
  Filled 2017-07-24: qty 3

## 2017-07-24 MED ORDER — IOHEXOL 350 MG/ML IV SOLN
100.0000 mL | Freq: Once | INTRAVENOUS | Status: AC
Start: 2017-07-24 — End: 2017-07-24
  Administered 2017-07-24: 100 mL via INTRAVENOUS
  Filled 2017-07-24: qty 100

## 2017-07-24 MED ORDER — ACETAMINOPHEN 650 MG RE SUPP
650.0000 mg | RECTAL | Status: DC | PRN
Start: 2017-07-24 — End: 2017-07-28

## 2017-07-24 MED ORDER — FAMOTIDINE IN NACL 20 MG/50ML IV SOLN
20.0000 mg | Freq: Once | INTRAVENOUS | Status: AC
Start: 2017-07-24 — End: 2017-07-24
  Administered 2017-07-24 (×2): 20 mg via INTRAVENOUS
  Filled 2017-07-24: qty 50

## 2017-07-24 MED ORDER — MORPHINE SULFATE 2 MG/ML IJ SOLN
2.0000 mg | INTRAMUSCULAR | Status: AC | PRN
Start: 2017-07-24 — End: 2017-07-25

## 2017-07-24 MED ORDER — SODIUM CHLORIDE 0.9% TKO INFUSION
INTRAVENOUS | Status: DC | PRN
Start: 2017-07-24 — End: 2017-07-28
  Administered 2017-07-24: 10 mL/h via INTRAVENOUS

## 2017-07-24 MED ORDER — ALUM & MAG HYDROXIDE-SIMETH 200-200-20 MG/5ML OR SUSP
30.0000 mL | Freq: Once | ORAL | Status: AC
Start: 2017-07-24 — End: 2017-07-25
  Filled 2017-07-24: qty 30

## 2017-07-24 MED ORDER — LATANOPROST 0.005 % OP SOLN
1.0000 [drp] | Freq: Every evening | OPHTHALMIC | Status: DC
Start: 2017-07-24 — End: 2017-07-28
  Administered 2017-07-24 – 2017-07-27 (×3): 1 [drp] via OPHTHALMIC
  Filled 2017-07-24: qty 2.5

## 2017-07-24 MED ORDER — MORPHINE SULFATE 4 MG/ML IJ SOLN
4.0000 mg | INTRAMUSCULAR | Status: AC | PRN
Start: 2017-07-24 — End: 2017-07-25

## 2017-07-24 MED ORDER — ALBUTEROL SULFATE 108 (90 BASE) MCG/ACT IN AERS
2.0000 | INHALATION_SPRAY | RESPIRATORY_TRACT | Status: DC
Start: 2017-07-24 — End: 2017-07-24

## 2017-07-24 MED ORDER — GABAPENTIN 100 MG OR CAPS
100.0000 mg | ORAL_CAPSULE | Freq: Two times a day (BID) | ORAL | Status: DC
Start: 2017-07-24 — End: 2017-07-28
  Administered 2017-07-24 – 2017-07-28 (×7): 100 mg via ORAL
  Filled 2017-07-24 (×8): qty 1

## 2017-07-24 MED ORDER — METOCLOPRAMIDE HCL 5 MG/ML IJ SOLN
10.0000 mg | Freq: Four times a day (QID) | INTRAMUSCULAR | Status: DC | PRN
Start: 2017-07-24 — End: 2017-07-28

## 2017-07-24 MED ORDER — ALBUTEROL SULFATE (5 MG/ML) 0.5% IN NEBU
2.5000 mg | INHALATION_SOLUTION | Freq: Four times a day (QID) | RESPIRATORY_TRACT | Status: DC | PRN
Start: 2017-07-24 — End: 2017-07-28

## 2017-07-24 MED ORDER — HEPARIN SODIUM (PORCINE) PF 5000 UNIT/0.5ML IJ SOLN
5000.0000 [IU] | Freq: Once | INTRAMUSCULAR | Status: AC
Start: 2017-07-24 — End: 2017-07-25
  Filled 2017-07-24: qty 0.5

## 2017-07-24 MED ORDER — CIPROFLOXACIN IN D5W 400 MG/200ML IV SOLN
400.0000 mg | Freq: Two times a day (BID) | INTRAVENOUS | Status: DC
Start: 2017-07-24 — End: 2017-07-25
  Administered 2017-07-24: 400 mg via INTRAVENOUS
  Filled 2017-07-24: qty 200

## 2017-07-24 MED ORDER — CIPROFLOXACIN IN D5W 400 MG/200ML IV SOLN
400.0000 mg | Freq: Once | INTRAVENOUS | Status: AC
Start: 2017-07-24 — End: 2017-07-24
  Administered 2017-07-24: 400 mg via INTRAVENOUS
  Filled 2017-07-24: qty 200

## 2017-07-24 MED ORDER — LIDOCAINE VISCOUS 2 % MT SOLN
10.0000 mL | Freq: Once | OROMUCOSAL | Status: AC
Start: 2017-07-24 — End: 2017-07-25
  Filled 2017-07-24: qty 15

## 2017-07-24 MED ORDER — POTASSIUM CHLORIDE CRYS CR 10 MEQ OR TBCR
40.0000 meq | EXTENDED_RELEASE_TABLET | Freq: Once | ORAL | Status: AC
Start: 2017-07-24 — End: 2017-07-24
  Administered 2017-07-24: 40 meq via ORAL
  Filled 2017-07-24: qty 4

## 2017-07-24 MED ORDER — PANTOPRAZOLE SODIUM 40 MG IV SOLR
40.0000 mg | Freq: Every day | INTRAVENOUS | Status: DC
Start: 2017-07-24 — End: 2017-07-27
  Administered 2017-07-24 – 2017-07-27 (×3): 40 mg via INTRAVENOUS
  Filled 2017-07-24 (×3): qty 40

## 2017-07-24 MED ORDER — POTASSIUM CHLORIDE 10 MEQ/100ML IV SOLN
10.0000 meq | INTRAVENOUS | Status: DC
Start: 2017-07-24 — End: 2017-07-24

## 2017-07-24 MED ORDER — POTASSIUM CHLORIDE CRYS CR 10 MEQ OR TBCR
40.0000 meq | EXTENDED_RELEASE_TABLET | Freq: Once | ORAL | Status: DC
Start: 2017-07-24 — End: 2017-07-24
  Filled 2017-07-24: qty 4

## 2017-07-24 MED ORDER — SODIUM CHLORIDE 0.9 % IJ SOLN (CUSTOM)
3.0000 mL | Freq: Three times a day (TID) | INTRAMUSCULAR | Status: DC
Start: 2017-07-24 — End: 2017-07-28
  Administered 2017-07-24 – 2017-07-28 (×8): 3 mL via INTRAVENOUS

## 2017-07-24 MED ORDER — POTASSIUM CHLORIDE 10 MEQ/100ML IV SOLN
10.0000 meq | INTRAVENOUS | Status: AC
Start: 2017-07-24 — End: 2017-07-24
  Administered 2017-07-24 (×2): 10 meq via INTRAVENOUS
  Filled 2017-07-24: qty 100

## 2017-07-24 MED ORDER — SODIUM CHLORIDE 0.9 % IJ SOLN (CUSTOM)
3.0000 mL | INTRAMUSCULAR | Status: DC | PRN
Start: 2017-07-24 — End: 2017-07-28

## 2017-07-24 MED ORDER — POTASSIUM CHLORIDE CRYS CR 10 MEQ OR TBCR
20.0000 meq | EXTENDED_RELEASE_TABLET | Freq: Once | ORAL | Status: AC
Start: 2017-07-24 — End: 2017-07-24
  Administered 2017-07-24: 20 meq via ORAL
  Filled 2017-07-24: qty 2

## 2017-07-24 MED ORDER — POTASSIUM CHLORIDE IN NACL 0.15-0.9 % IV SOLN
INTRAVENOUS | Status: DC
Start: 2017-07-24 — End: 2017-07-25
  Administered 2017-07-24: 12:00:00 via INTRAVENOUS
  Filled 2017-07-24 (×2): qty 1000

## 2017-07-24 MED ORDER — METRONIDAZOLE IN NACL 5-0.79 MG/ML-% IV SOLN
500.0000 mg | Freq: Three times a day (TID) | INTRAVENOUS | Status: DC
Start: 2017-07-24 — End: 2017-07-25
  Administered 2017-07-24 (×2): 500 mg via INTRAVENOUS
  Filled 2017-07-24 (×2): qty 100

## 2017-07-24 MED ORDER — ALBUTEROL SULFATE 108 (90 BASE) MCG/ACT IN AERS
2.0000 | INHALATION_SPRAY | RESPIRATORY_TRACT | Status: DC | PRN
Start: 2017-07-24 — End: 2017-07-24

## 2017-07-24 MED ORDER — ONDANSETRON HCL 4 MG/2ML IV SOLN
4.0000 mg | Freq: Four times a day (QID) | INTRAMUSCULAR | Status: DC | PRN
Start: 2017-07-24 — End: 2017-07-24

## 2017-07-24 MED ORDER — ACETAMINOPHEN 160 MG/5ML OR SOLN
650.0000 mg | ORAL | Status: DC | PRN
Start: 2017-07-24 — End: 2017-07-28

## 2017-07-24 MED ORDER — MORPHINE SULFATE 2 MG/ML IJ SOLN
6.0000 mg | INTRAMUSCULAR | Status: DC | PRN
Start: 2017-07-24 — End: 2017-07-24
  Administered 2017-07-24: 6 mg via INTRAVENOUS

## 2017-07-24 MED ORDER — ACETAMINOPHEN 325 MG PO TABS
650.0000 mg | ORAL_TABLET | ORAL | Status: DC | PRN
Start: 2017-07-24 — End: 2017-07-28

## 2017-07-24 MED ORDER — METRONIDAZOLE IN NACL 5-0.79 MG/ML-% IV SOLN
500.0000 mg | Freq: Once | INTRAVENOUS | Status: AC
Start: 2017-07-24 — End: 2017-07-24
  Administered 2017-07-24: 500 mg via INTRAVENOUS
  Filled 2017-07-24: qty 100

## 2017-07-24 MED ORDER — POTASSIUM CHLORIDE 10 MEQ/100ML IV SOLN
10.0000 meq | INTRAVENOUS | Status: AC
Start: 2017-07-24 — End: 2017-07-25
  Administered 2017-07-24 – 2017-07-25 (×3): 10 meq via INTRAVENOUS
  Filled 2017-07-24 (×3): qty 100

## 2017-07-24 NOTE — ED Notes (Addendum)
Questions clarified w Jarrett Soho, RN [place].

## 2017-07-24 NOTE — ED Floor Report (Addendum)
ED to IP Handoff    Report created by Elwanda Brooklyn, RN at 6:52 AM 07/24/2017.     HANDOFF REPORT UPDATE/CHANGES (changes in patient status/care/events prior to transfer)  By who: E Preethi Scantlebury RN   Time: 1345  Additional information: Pt w no complaints of pain while this RN has been caring for her. Pt still needs to go to MRI, then will be transferred up after.                                                                                                                                                      Emily Ball is a 66 year old female.    Brief Summary of ED Visit (to include focused assessment and neuro status):   Patient states that she is having nausea and vomiting for the last 3 hours with intermittent sharp abdominal pain for almost 24 hours.  She has had few hard stools but not quite constipation most recent of which was earlier today.  Patient has a history of advanced uterine cancer for which she received a hysterectomy.  She also had a stroke in 2009, and has persistent L-sided weakness/neuropathy.    RN shift assessment exceptions to WDL: A/Ox4, Follows commands, NSR, Lungs CTA on RA, Nausea without vomiting, Skin intact, Bedbound due to left sided weakness from previous stroke, Purewick to void.     Any significant events and interventions with responses:  See eMAR    Radiologic studies not completed: NA  (None unless otherwise noted)    Chief Complaint   Patient presents with    Abdominal Pain     BIBA, from home, has a 24 hour caretaker, here with 8/10 abdominal pain, nausea, and vomiting.    Nausea    Vomiting       Admitted for: Chole    Code Status:  Please refer to In-pt admitting doctors orders     Level of Care: TBD     Is patient septic? no If yes, complete below:    BC x 2 drawn? no  If No explain:  NA    Repeat lactate needed? no  If Yes, when is it due?  NA    All initial antibiotics given?  yes  If No, explain:  NA    Amount of IV fluids received 0 ml    Is patient on  Heparin? no If yes, complete below:     Time Heparin bolus was given: NA    Additional drips patient is on: NA    Cardiac rhythm: NSR    Oxygen Delivery: None    No past medical history on file.    No past surgical history on file.    Allergies: Pcn [penicillins]    ED Fall Risk: (!) Yes    Skin issues:  no    >>  If yes, note areas of skin breakdown. See appropriate photos.      Ambulatory:  no    Sitter needed: no    Suicide Risk:  no    Isolation Required: no     >> If yes , what type of isolation: NA    Is patient in custody?  no    Is patient in restraints? no    Vitals:    07/24/17 0209 07/24/17 0429 07/24/17 0600   BP: 152/72 (!) 184/82 156/97   BP Location: Right arm Right arm Right arm   BP Patient Position: Supine Supine Supine   Pulse: 60 72 74   Resp: 22 18 17    Temp: 97.9 F (36.6 C) 97.7 F (36.5 C)    SpO2: 96% 96% 94%       Tubes Collected: Blue, Yellow SST, Green PST, Lavender (07/24/17 0235 : Silvio Clayman, RN)    Lab Results   Component Value Date    WBC 6.3 07/24/2017    RBC 4.25 07/24/2017    HGB 13.1 07/24/2017    HCT 41.8 07/24/2017    MCV 98.4 (H) 07/24/2017    MCHC 31.3 (L) 07/24/2017    RDW 14.8 (H) 07/24/2017    PLT 194 07/24/2017    MPV 11.0 07/24/2017       Lab Results   Component Value Date    NA 140 07/24/2017    K 2.9 (L) 07/24/2017    CL 101 07/24/2017    BICARB 29 07/24/2017    BUN 7 (L) 07/24/2017    CREAT 0.45 (L) 07/24/2017    GLU 156 (H) 07/24/2017    [place] 8.9 07/24/2017       Lab Results   Component Value Date    PHOS 3.1 07/24/2017    MG 1.9 07/24/2017       No results found for: CPK, CKMBH, TROPONIN    No results found for: PH, PCO2, O2CONTENT, IVHC3, IVBE, O2SAT, UNPH, UNPCO2, ARTPH, ARTPCO2, ARTO2CNT, IAHC3, IABE, ARTO2SAT, UNAPH, UNAPCO2    No results found for this visit on 07/24/17.      Patient Lines/Drains/Airways Status    Active PICC Line / CVC Line / PIV Line / Drain / Airway / Intraosseous Line / Epidural Line / ART Line / Line Type / Wound     Name: Placement  date: Placement time: Site: Days:    Peripheral IV - 22 G Right Hand 07/24/17   0230   Hand   less than 1                    ED Handoff Report is ready for review.  Admitting RN may reach Emergency Department RN, Elwanda Brooklyn, RN, at [phone] with any questions.

## 2017-07-24 NOTE — ED Notes (Signed)
Assumed care of pt, Report received from [place], [place].   Pt resting comfortably in bed, surg MD at bedside. Pt in nad.

## 2017-07-24 NOTE — ED Notes (Signed)
Call generated to Jarrett Soho, RN notifying that floor report is filed with opportunity to answer questions

## 2017-07-24 NOTE — Consults (Addendum)
Gastroenterology Initial Consult Note  Consult Attending: Rozelle Logan, MD  Current Attending: Glade Nurse, MD  Date: 07/24/17    Patient Name: Emily Ball , MRN: [id number]    Hospital Day:   0 days - Admitted on: 07/24/2017    Reason for consultation:  cholestasis concerns for biliary obstruction    History of Present Illness: Emily Ball is a 66 year old [place] female who's code status is currently DNR/DNI with past medical history significant for stage IV uterine cancer s/p TAH/BSO 09/13/2007 (Completed course of radiation therapy 12/2007), previous stroke with left-sided hemiplegia and remains bed-bound with caregiver to assist with ADLs, previous history of MI x 3 with stent placements, pancolonic diverticulosis (dx'ed on colonoscopy at [facility] Feb 2015), GERD, hypertension, anxiety/depression, previous tracheostomy with stoma present, OSA, and morbid obesity who was in her normal state of health until 2 days ago when she developed an acute onset of abdominal pain, nausea, vomiting, subjective fevers and chills.    She reports her abdominal pain began in the epigastric region and progressed to both RUQ and LUQ. Denies radiation of pain to her back or shoulders. She described the initial onset of her pain as "really bad acid reflux". Her pain is constant, rates it as 8/10, and not worsened by movement. She reports relief of her pain with pain medications in the ED. She reports feeling nauseous yesterday with 3 episodes of non-bloody non-bilious emesis. Her pain was also associated with 1 day of subjective fevers, sweats, and chills. Her last BM was yesterday and she regularly has 2 BM per day. Denies constipation, melena, hematochezia, hematemesis. Reports difficulty swallowing since her CVA, however denies food feeling stuck in her chest - mainly difficulty with initial phase of swallowing and just needs to cut her portions smaller. She denies history of post-prandial RUQ pain following fatty  meals. She has never been diagnosed with gallstones. Denies chest pain or shortness of breath.    Recently seen at [facility] where she had CT abdomen and pelvis on 06/21/2017 that showed cholelithiasis, with mild pericholecystic fluid between the gallbladder and liver, at the time LFTs AST 13, ALT 10, ALP 92, t.bili 0.3    ED Course:   Afebrile, Hemodynamically stable  Labs revealed potassium of 2.9 (repleted), as well as  normal lipase 18,  ALP 226, ALT 209, AST 379, t.bili 1.66, normal WBC 6.3, Hb 13  Imaging included CTAP and Abdominal US which revealed evidence of gallbladder wall thickening and pericholecystic fluid suggestive of cholecystitis  GI and Gen Surgery consulted     Past Medical History:  - HTN  - Morbid Obesity  - Prior CVA with L-sided Hemiplegia, now bed-bound with caregiver to assist with ADLs   - Endometrial Stage IV [place]   - Anxiety  - Depression  - History of MI x 3 with stent placement  - Diverticulosis  - OSA    Past Surgical History:   - TAH/BSO for Endometrial [place] 2009  - Previous tracheostomy     Allergies:  Allergies   Allergen Reactions    Pcn [Penicillins] Anaphylaxis       Home Medications:  Albuterol  Lorazepam  Nitroglycerin  Norco  Zoloft  Levothyroxine    Inpatient Medications:  Scheduled Meds   aluminum-magnesium-simethicone  30 mL Once    lidocaine  10 mL Once     IV Meds   magnesium sulfate 1 g (07/24/17 0858)       Family History:  No  pancreas cancer    Social History:   Ms. Krenzer is currently bed-bound and she reports paying out-of-pocket for a caregiver to assist her with her ADLs. She has friends that visit her and reports trying her best to remain positive, despite feeling depressed and crying frequently because of her health and situation. Did not assess smoking, drug or alcohol use at this time. She was born in [place].     Review of Systems:   Constitutional: + fevers and chills, Denies weight loss  ENT: negative for eye changes or oral lesions, + difficulty  swallowing  Cardiac: Denies chest pain or palpitations  Lungs: Denies cough or shortness of breath  GI: Negative except noted in the HPI  GU: Denies dysuria, hematuria  Skin: Denies rashes or other skin lesions  MSK: Denies joint deformities or new joint pain  Lymph: Denies enlarged nodes, night sweats, abnormal bleeding  Psych: negative for depressed mood, anxiety  Neuro: negative for confusion or sensory changes, + weakness on L-side     OBJECTIVE:   Vital Signs:  Temperature:  [97.7 F (36.5 C)-97.9 F (36.6 C)] 97.7 F (36.5 C) (10/12 0429)  Blood pressure (BP): (152-184)/(72-97) 156/97 (10/12 0600)  Heart Rate:  [60-74] 74 (10/12 0600)  Respirations:  [17-22] 17 (10/12 0600)  Pain Score: Patient Sleeping, Respiratory Assessment Done (10/12 0429)  O2 Device: None (Room air) (10/12 0600)  SpO2:  [94 %-96 %] 94 % (10/12 0600)    Respiratory support:  Oxygen Therapy  SpO2: 94 %  O2 Device: None (Room air)     Physical Exam:  General:  66 yo obese female in no acute distress, lying comfortably in hospital gurney, cooperative  HEENT:  No scleral icterus, Trachea midline, dry mucous membranes .  Lungs: Normal respiratory effort.  Cardiovascular:  Regular rate and rhythm  Abdomen:  Abdomen soft, mild tenderness to palpation in epigastric region, non distended, bowel sounds present, presence of previous TAH/BSO scars, negative Murphys sign   Extremities: Lower extremity edema, non-pitting. Warm extremities. Decreased distal perfusion to dorsalis pedis, 1+ bilaterally  Skin:  Non-icteric and no visible rash  Neuro:  A&Ox3. LUE and LLE weakness 1/5. RUE and RLE 5/5. No asterixis  Psych:  Normal affect.    Labs:     CBC  Recent Labs      07/24/17   0231   WBC  6.3   HGB  13.1   HCT  41.8   PLT  194   SEG  66   LYMPHS  28   MONOS  6        Chemistry  Recent Labs      07/24/17   0429   NA  140   K  2.9*   CL  101   BICARB  29   BUN  7*   CREAT  0.45*   GLU  156*   [place]  8.9   MG  1.9   PHOS  3.1     Recent Labs       07/24/17   0429   ALK  226*   AST  379*   ALT  209*   TBILI  1.66*   ALB  3.2*          Coags  No results for input(s): PT, PTT, INR in the last 72 hours.    Imaging:   Abdomen US 07/24/17:  FINDINGS:  LIVER:  9.9 cm in long axis.  Normal in echogenicity.  Main  portal vein demonstrates slow flow, but is patent with normal direction of flow.    BILIARY: Common bile duct measures 6 mm.  Mild intrahepatic biliary dilation.    GALLBLADDER: Multiple floating gallstones and biliary sludge with wall thickening up to 4 mm. Small amount of pericholecystic fluid. Sonographic Percell Miller sign is equivocal given premedication.    RIGHT KIDNEY: Poorly imaged, measuring approximately 10.0 cm No hydronephrosis.    PANCREAS: Visualized portion unremarkable.    VASCULAR: visualized aorta and inferior vena cava unremarkable.    OTHER: No ascites.    CTAP with Contrast 07/24/17:  IMPRESSION:  CT scan of the abdomen and pelvis with IV contrast.    1. Cholelithiasis with possible areas of gallbladder wall thickening and pericholecystic fluid between the gallbladder and liver without definite pericholecystic fat stranding or extrahepatic bile duct dilation. Findings are equivocal for cholecystitis. Recommend ultrasound to further evaluate.    2. Bilateral lobulated kidneys with many subcentimeter hypoattenuating lesions, too small to characterize.    3. Hepatomegaly, non-specific    Prior Endoscopy:     Ivar Bury 11/2013 colonoscopy  SEDATION (total amount, given in incremental doses) :   Versed 4 mg  Demerol 50 mg    FINDINGS:   1) digital rectal exam was normal  2) internal hemorrhoids medium  3) scattered colonic diverticulosis. No blood in colon, on   stigmata of bleeding  4) at 50cm (in), a 51m sessile polyp --> hot snare polypectomy   performed and retrieved. Jar A.  Then prophylactic hemoclip x 1  5) at 70cm (in), a 1cm sessile polyp --> hot snare polypectomy   performed and bisected then retrieved. Jar B  Then prophylactic hemoclip x  1  6) at 110cm (in, mid-ascending), an 833msessile polyp --> hot   snare polypectomy performed and retrieved. Jar C  Then prophylactic hemoclip x 1  7) at proximal ascending colon, a large polypoid mass that   carpets1/3 the circumference and extends between two adjacent   folds.  Multiple sample biopsy jar D.  Then 1.5cc inNigernk x 2 aliquots injected along distal left and   right margin edges of the lesion.    IMPRESSIONS:   - pancolonic diverticulosis, likely basis of patient's GI bleed.    No further bleeding  - incidental finding of three polyps, all removed  - incidental finding of polypoid mass at the proximal ascending   colon; sample biopsy take, majority remains  - patient is Jehovah's witness    RECOMMENDATIONS:   1) Follow up pathology result  2) if no cancer is demonstrated on jar D, then will advise   outpatient followup with me to discuss possibility of  Repeat colonoscopy(ies) to attempt complete removal of lesion.  Increased risks will be discussed, as well as patient is   Jehovah's witness.Will need to make sure H/H normalizes if   attempting future removal of this large lesion  3) po clears this evening, can advance tomorrow if continues to   do well.  4) serial H/H, supportive care.  Possible discharge home tomorrow if remains stable    ________________________________________________________________  FINAL PATHOLOGIC DIAGNOSIS  A. Colon, at 50 cm, biopsy  -TUBULAR ADENOMA     B. Colon, at 70 cm, biopsy  -TUBULAR ADENOMA     C. Colon, at 110 cm, biopsy  -BENIGN REDUNDANT MUCOSA WITH FOCAL SURFACE EROSION AND MILD CHRONIC   INFLAMMATION    D. Mass, ascending colon, biopsy  -FRAGMENTS OF TUBULOVILLOUS ADENOMA  ASSESSMENT AND PLAN:   Ms. Musgrave is a 66 yo Jehovah's Witness F with history of stage IV metastatic uterine cancer s/p TAH/BSO in 2009, who is currently DNR/DNI code status, as well as multiple other co-morbidities including (prior CVA with L-sided hemiplegia, MI x3 with  PCI and stent, morbid obesity, HTN) who presents with acute onset abdominal pain, subjective fevers and chills, and CT Abdomen and RUQ US shows cholelithiasis with likely acute cholecystitis. She also has elevated LFTs, her findings are suspicious for biliary obstruction from a retained gallstone    Recommendations/Plan:  - Check INR  - MRI/MRCP  - Broad spectrum antibiotics  - Plan for EUS/ERCP tomorrow vs Sunday depending on anesthesia availability  - Keep NPO after midnight  - In the event of acute decompensation or hemodynamical instability, call IR for decompression   The risks, benefits and alternatives of procedure and sedation including perforation, bleeding, infection, and sedation adverse reaction, were explained and accepted by the patient, and the patient agrees to the treatment plan.   - Keep NPO after midnight  - If she becomes hemodynamically unstable, can consult IR for endoscopic drainage    Thank you for the consult. We will continue to follow. Please page [phone] with questions.     Pt discussed with attending, Dr. Ardath Sax  Gastroenterology Fellow

## 2017-07-24 NOTE — Interdisciplinary (Signed)
07/24/17 1035   Initial Assessment Completed   CM Initial Assessment Completed   Patient Information   Where was the patient admitted from? Home   Prior to Level of Function Needed Total Assist with Mobility   Assistive Device BS commode;Hospital bed  (hoyer lift, trapeze )   Prior [employer] None   Primary Caretaker(s) Alone;Roommate   Primary Contact Name, Number and Relationship Angelita Ingles- caregiver- [phone]   Permission to Contact Yes   Secondary Contact Name, Number and Relationship Kandra Nicolas- friend- [place] [facility]   Financial Resources Medicare   Discharge Planning   Living Arrangements Alone;Roommate   Available Assistance/Support System Friends / neighbors;Home care staff   Type of [place] Yes  (Pt with home care staff- Angelita Ingles)   Barriers to Discharge Awaiting clinical improvement   Patient/Family Engaged in Discharge Planning Yes   Patient Has Decision Making Capacity Yes   Public Health Clearance Needed Not Applicable   Social Worker Consult   Do you need to see a social worker?  No   Readmission Risk Assessment   Readmission Within 30 Days of Discharge No   Admission Was Unplanned   Patient Explanation  Got sicker   Recent Hospitalizations (Within Last 6 Months) No   High Risk For Readmission Yes   High Risk Indicators Chronic illness;Impaired mobility;Impaired self-care skills   Action Taken To Prevent Readmission After Discharge Transitions of care follow-up   Recommendations to the Physician Home health orders   MOON   MOON Provided to Patient Not Applicable     ED RN Case Management Note  PLOF dependent, bedbound adult  Pt lives with a roommate and has great support   Pt sees Dr. Kyung Bacca as PCP  Pt gets all RX meds from pharmacy  Pt with DME: as listed above  Pt with monthly income; not discussed since surgical team arrived  Pt agreeable with POC, tolerating meds, and pending procedure  XVQ:MGQQ  and will need bariatric transportation. Nori Riis, RN

## 2017-07-24 NOTE — Interdisciplinary (Signed)
MD called at this time as pt had episode of emesis post PO potassium given, pt became bradycardic HR 38-50's, MD aware, IV reglan ordered at this time for vomiting. No further change in plan of care. Will continue to monitor pt closely.

## 2017-07-24 NOTE — ED MD Progress Note (Signed)
Workup Review   Emily Ball's Documentation   Comment Time   Patient was discussed with oncoming ED team and will be signed out to the senior resident for ongoing evaluation and care.  Patient is clinically stable at this time. 10/12 0723   Patient's CT scan was performed and demonstrated gallbladder mass with focally thickened wall and intraluminal gas vs fat globules.  Patient will be started on IV antibiotics for presumed cholecystitis and will be discussed with General Surgery for possible admission. 10/12 0548   Patient's labs have started to result and demonstrate no leukocytosis or anemia, a potassium of 2.9 that will be repleted now, and new transaminitis, bilirubinemia and elevated alk phos concerning for cholecystitis versus hepatitis.  Patient has a 22 gauge IV which will likely not be able to administer contrast for her CT scan.  She had only refuses both placement of a different larger IV and understands that this may limit her ability to have an adequate CT scan. 10/12 0509

## 2017-07-24 NOTE — ED Notes (Signed)
07/24/2017 4:33 AM [place]    An EKG was handed to Dr. Efrain Sella with prior from [place]

## 2017-07-24 NOTE — ED Notes (Signed)
Patient states pain has subsided with IV Morphine. Currently refusing further IV placements for CT scanning purposes despite multiple attempts to encourage such. Dr Leonides Schanz at bedside, aware of patient refusal.

## 2017-07-24 NOTE — ED MD Progress Note (Addendum)
Workup Review   Others' Documentation   Comment By Time   Patient was discussed with oncoming ED team and will be signed out to the senior resident for ongoing evaluation and care.  Patient is clinically stable at this time. Belovarski, Terese Door, MD 10/12 [phone]   Patient's CT scan was performed and demonstrated gallbladder mass with focally thickened wall and intraluminal gas vs fat globules.  Patient will be started on IV antibiotics for presumed cholecystitis and will be discussed with General Surgery for possible admission. Belovarski, Terese Door, MD 10/12 [phone]   Patient's labs have started to result and demonstrate no leukocytosis or anemia, a potassium of 2.9 that will be repleted now, and new transaminitis, bilirubinemia and elevated alk phos concerning for cholecystitis versus hepatitis.  Patient has a 22 gauge IV which will likely not be able to administer contrast for her CT scan.  She had only refuses both placement of a different larger IV and understands that this may limit her ability to have an adequate CT scan. Belovarski, Terese Door, MD 10/12 [phone]       S/o from Dr. Lenoria Chime,    66 y/o hx of uterine [place] remote hx, hx of L sided deficits from prior stroke. here with N/V/abdominal pain to RUQx 24 hours, labs with elevated LFTs and elevated bili, CT abdomen done which shows possible cholecystitis. Plan to admit to surgery    F/u: surgery recs    Dispo: Admit to surgery vs meicine      Labs Reviewed   CBC WITH DIFF, BLOOD - Abnormal; Notable for the following:        Result Value    MCV 98.4 (*)     MCHC 31.3 (*)     RDW 14.8 (*)     All other components within normal limits   COMPREHENSIVE METABOLIC PANEL, BLOOD - Abnormal; Notable for the following:     Glucose 156 (*)     BUN 7 (*)     Creatinine 0.45 (*)     Potassium 2.9 (*)     Total Protein 8.3 (*)     Albumin 3.2 (*)     Bilirubin, Tot 1.66 (*)     AST (SGOT) 379 (*)     ALT (SGPT) 209 (*)     Alkaline Phos 226 (*)     All other components within normal  limits   LAB ADD ON TEST   LAB ADD ON TEST   LAB ADD ON TEST   MAGNESIUM, BLOOD   PHOSPHORUS, BLOOD   LIPASE, BLOOD     Medications   aluminum-magnesium-simethicone (MAG-AL PLUS) 200-200-20 MG/5ML suspension 30 mL (30 mLs Oral Not given 07/24/17 0444)   lidocaine (XYLOCAINE) 2 % viscous solution 10 mL (10 mLs Oral Not given 07/24/17 0444)   potassium chloride 10 MEQ/100ML IVPB 10 mEq (10 mEq IntraVENOUS New Bag 07/24/17 0700)   ciprofloxacin (CIPRO) IVPB 400 mg (400 mg IntraVENOUS New Bag 07/24/17 0626)   metroNIDAZOLE (FLAGYL) IVPB 500 mg (500 mg IntraVENOUS New Bag 07/24/17 0626)   ondansetron (ZOFRAN) injection 4 mg (4 mg IntraVENOUS Given 07/24/17 0626)   famotidine (PEPCID) IVPB 20 mg (0 mg IntraVENOUS Completed 07/24/17 0459)   iohexol (OMNIPAQUE 350) 350 MG/ML solution 100 mL (100 mLs IntraVENOUS Given 07/24/17 0532)   potassium chloride (KLOR-CON) ER tablet 40 mEq (40 mEq Oral Given 07/24/17 0604)     CT Abdomen And Pelvis With Contrast    (Results Pending)  9:24 AM  GB stones with prominence of cystic ducts. Wall thickening. CBD not actually dilated, most likely falsely elevated. Most likely acute cholecystitis per radiology but no obvious fat stranding. Recommend US.

## 2017-07-24 NOTE — ED MD Progress Note (Signed)
Workup Review   Others' Documentation   Comment By Time   Patient was discussed with oncoming ED team and will be signed out to the senior resident for ongoing evaluation and care.  Patient is clinically stable at this time. Belovarski, Terese Door, MD 10/12 [phone]   Patient's CT scan was performed and demonstrated gallbladder mass with focally thickened wall and intraluminal gas vs fat globules.  Patient will be started on IV antibiotics for presumed cholecystitis and will be discussed with General Surgery for possible admission. Belovarski, Terese Door, MD 10/12 [phone]   Patient's labs have started to result and demonstrate no leukocytosis or anemia, a potassium of 2.9 that will be repleted now, and new transaminitis, bilirubinemia and elevated alk phos concerning for cholecystitis versus hepatitis.  Patient has a 22 gauge IV which will likely not be able to administer contrast for her CT scan.  She had only refuses both placement of a different larger IV and understands that this may limit her ability to have an adequate CT scan. Belovarski, Terese Door, MD 10/12 [phone]   s/o morshed     66 yo w/ ruq pain, n/v. Ct done possible stones cholecystitis talked with surgery initially not surgical candidate  Gi consulted.   ruq [place]. mrcp ordered  Admitted medicine  cipro flagyl given hds   ntd

## 2017-07-24 NOTE — ED Notes (Signed)
Repeat CMP collected from [facility], sent to lab.

## 2017-07-24 NOTE — Interdisciplinary (Signed)
Informed of critical potassium level of 2.5 by lab. MD notified and aware.

## 2017-07-24 NOTE — ED Notes (Signed)
Patient awake, alert, oriented x4, follows commands, non-ambulatory. Presents with 8/10 epigastric burning/abdominal pain. Also with nausea and vomiting x3 with normal stomach contents. Denies CP, SOB, fever, chills, blood in output, diarrhea, or dysuria. Baseline incontinent of urine and stool. History of stroke with left sided upper and lower deficits. LUE 1/5, RUE 5/5, LLE 1/5, RLE 3/5. LLE tender to touch with no edema, no obvious sign of infection. MD aware, awaiting evaluation.

## 2017-07-24 NOTE — ED EKG Interpretation (Signed)
ED EKG Interpretation    EKG: Normal Sinus Rhythm, No ST elevations or depressions and rate 62, u-waves present with Normal Axis and unchanged from prior tracing, no ischemic changes and new u-waves.

## 2017-07-24 NOTE — ED Notes (Signed)
[place] at bedside,. Pt in nad.

## 2017-07-24 NOTE — Interdisciplinary (Signed)
Patient admitted to telemetry and  VSS, denies any pain at this time. Patient sitting up and eating clear liquids and tolerating. Admission education given. Will continue to monitor.

## 2017-07-24 NOTE — H&P (Addendum)
HISTORY AND PHYSICAL    Admission Date:  07/24/2017    Attending MD:   Maurine Minister, MD    Primary Care Provider:  Michaelle Birks    Chief Complaint:  Abdominal pain      History of Present Illness:     A 66 year old female with hx metastatic uterine cancer s/p TAH/BSO 09/13/2007, completed radiation 12/2007, hypertension, peripheral neuropathy, morbid obesity, bed bound due to L hemiplegia and morbid obesity. She presents with dull abdominal pain that feels like "bloating" sensation in right upper quadrant that started yesterday associated with nausea and vomiting.  She is a Jehovah Witness  She was previously seen in [facility] and [facility]    She was evaluated by GI and surgery in ER. She was started on IV cipro and IV flagyl.   [place] RUQ was done with dilated CBD to 65m, and pancreatic head fullness    She allergic to PCN with anaphylaxis reaction in past    Pain Assessment:  Pain is dull 8/10, radiates to back, no change with movement    Past Medical and Surgical History:  No past medical history on file.  No past surgical history on file.    Immunization History:    There is no immunization history on file for this patient.    Allergies:  Allergies   Allergen Reactions    Pcn [Penicillins] Anaphylaxis       Medications:  Prior to Admission Medications:    (Not in a hospital admission)  Current Inpatient Medications:   aluminum-magnesium-simethicone  30 mL Once    lidocaine  10 mL Once    pantoprazole  40 mg Daily      ciprofloxacin (CIPRO) IVPB      metroNIDAZOLE      sodium chloride 0.9%-potassium chloride 20 mEq 75 mL/hr at 07/24/17 1142         Social History:  Social History     Social History    Marital status: Other     Spouse name: N/A    Number of children: N/A    Years of education: N/A     Social History Main Topics    Smoking status: Not on file    Smokeless tobacco: Not on file    Alcohol use Not on file    Drug use: Not on file    Sexual activity: Not on file        Social Activities of Daily Living Present    Not on file     Social History Narrative       Family History:  No family history on file.    Review of Systems:  Constitutional: fatigue.  Eyes: negative.  Ears, Nose, Mouth, Throat: negative.  CV: negative.  Resp: negative.  GI: heartburn or reflux, abdominal pain, excessive gas or bloating.  GU: negative.  Musculoskeletal: negative.  Integumentary: negative.  Neuro: negative.  Psych: negative.  Heme/Lymphatic: negative.  All others negative    Physical Exam:  Temperature:  [97.7 F (36.5 C)-97.9 F (36.6 C)] 97.7 F (36.5 C) (10/12 0429)  Blood pressure (BP): (152-184)/(72-97) 156/97 (10/12 0600)  Heart Rate:  [60-76] 68 (10/12 1000)  Respirations:  [16-22] 16 (10/12 1000)  Pain Score: Patient Sleeping, Respiratory Assessment Done (10/12 0429)  O2 Device: None (Room air) (10/12 0600)  SpO2:  [94 %-99 %] 99 % (10/12 1000)  General:  In mild distress, morbid obesiry  HEENT:  NC AT   Neck:  No  JVD trachea midline  Lungs:  CTA b/l  CV: RRR s1s2, occasional PVCs are heard on exam  Abdomen:  Obese, tender to palpation right upper quadrant. BS + b/l  GU:  No cva tenderness  Extremities:  No c/c/e  Neuro:  Aoox3, motor and sensory nl, CN 2-12 intact    Labs and Other Data:  Lab Results   Component Value Date    NA 140 07/24/2017    K 2.9 (L) 07/24/2017    CL 101 07/24/2017    BICARB 29 07/24/2017    BUN 7 (L) 07/24/2017    CREAT 0.45 (L) 07/24/2017    GLU 156 (H) 07/24/2017    [place] 8.9 07/24/2017     Lab Results   Component Value Date    WBC 6.3 07/24/2017    HGB 13.1 07/24/2017    HCT 41.8 07/24/2017    PLT 194 07/24/2017     Lab Results   Component Value Date    AST 379 (H) 07/24/2017    ALT 209 (H) 07/24/2017    ALK 226 (H) 07/24/2017    TBILI 1.66 (H) 07/24/2017    TP 8.3 (H) 07/24/2017    ALB 3.2 (L) 07/24/2017     No results found for: INR, PTT    EKG with prolonged QTc and PVCs    CT scan of the abdomen and pelvis with IV contrast.    1. Cholelithiasis with  possible areas of gallbladder wall thickening and pericholecystic fluid between the gallbladder and liver without definite pericholecystic fat stranding or extrahepatic bile duct dilation. Findings are equivocal for cholecystitis. Recommend ultrasound to further evaluate.    2. Bilateral lobulated kidneys with many subcentimeter hypoattenuating lesions, too small to characterize.    3. Hepatomegaly, non-specific    [facility] 11/2013 colonoscopy: pancolonic diverticulosis, with hx of bleeding, removed polyps, tubular adenoma on pathology report    Assessment and Care Plan:  A 66 yo female presents with abdominal pain and elevated LFTs    #Suspected acute cholecystitis with cholelithiasis  Cont Ciprofloxacin IV and Metronidazole IV but monitor closely on telemetry due to QTc prolongation and replete potassium and magnesium as needed  CT abd: dilated CBD to 81m, Cholelithiasis with possible areas of gallbladder wall thickening and pericholecystic fluid   Will repeat another CMP today due to elevated LFTs  Check MRI abdomen wo/w conrast (MRCP)  reglan prn nausea, avoid zofran due to QT prolongation  Check INR/CMP/CBC in am  Check old records (requested)  Appreciated GI input  Surgery evaluated the patient in ER and recommended no surgery at this time as per ER signout  Will keep patient on clears now and NPO PM for EUS and ERCP tomorrow am  Start IVF with potassium  if she becomes hemodynamically unstable, can consult IR for endoscopic drainage  Morphine IV prn pain  Vitals q4h    # Hypokalemia with PVCs: cont tele, replaced potassium, recheck potassium tonight, repeat EKG in am    #Jehovah witness status    #hx metastatic uterine cancer s/p TAH/BSO 09/13/2007, completed radiation 12/2007, hypertension, peripheral neuropathy, morbid obesity, bed bound due to L hemiplegia, late effect of stroke and morbid obesity.  Hypothyrodism and iron deficiency anemia, GERD  Continue outpatient medications: albuterol, gabapentin,  latanoprost, hold aspirin  Unfortunately, patient does not remember her dose of synthroid, hydrocodone and name and dose of "anxiety" medication      Code: DNAR/FUll care    DVT proph: will give one dose of heparin  sq today, she will have a procedure tomorrow        This plan and alternatives have been discussed with the patient and/or surrogate.    Code Status:  No orders of the defined types were placed in this encounter.      The patient's primary care physician or clinic has not been contacted regarding this admission.    Note Author: Maurine Minister,   [facility]  Pager [phone]  07/24/17, 11:51 AM      Time spent 60 mins    .

## 2017-07-24 NOTE — ED Notes (Signed)
To and from CT. Patient refusing second IV. Attached to full cardiac monitors due to low serum potassium. No complaints of CP or palpitations. Full bed change. Purewick placed. Surgery consult pending.

## 2017-07-24 NOTE — ED Provider Notes (Signed)
EMERGENCY DEPARTMENT NOTE  [place] electronic medical record has been reviewed for pertinent medical history.     Nursing Triage Note:   Chief Complaint   Patient presents with    Abdominal Pain     BIBA, from home, has a 24 hour caretaker, here with 8/10 abdominal pain, nausea, and vomiting.    Nausea    Vomiting       HPI:  Patient is a 66 year old female who has a past medical history of Left-sided muscle weakness; Obesity; Stroke (CMS-HCC); and Uterine cancer (CMS-HCC).  Patient arrives to the Emergency Department with complaint of: Abdominal Pain; Nausea; and Vomiting    Patient states that she is having nausea and vomiting for the last 3 hours with intermittent sharp abdominal pain for almost 24 hours.  She has had few hard stools but not quite constipation most recent of which was earlier today.  Patient has a history of advanced uterine cancer for which she received a hysterectomy.  She also had a stroke in 2009, and has persistent L-sided weakness/neuropathy.  Patient is largely bed ridden.  She denies any fevers, chills, chest pain, shortness of breath, dysuria, headache, dizziness, or other associated symptoms at this time.  Of note, patient states that she has previously been told at an outside hospital that she might need a right upper quadrant ultrasound, but she is not entirely sure why.    Patient is a 66 year old female presenting with vomiting.   History provided by:  Patient  Language interpreter used: No    Vomiting   Associated symptoms: abdominal pain        History:  Past Medical History:   Diagnosis Date    Left-sided muscle weakness     Obesity     Stroke (CMS-HCC)     Uterine cancer (CMS-HCC)        Past Surgical History:   Procedure Laterality Date    HYSTERECTOMY         No family history on file.    Social History   Substance Use Topics    Smoking status: Not on file    Smokeless tobacco: Not on file    Alcohol use Not on file       Medications:  No current  facility-administered medications on file prior to encounter.      Current Outpatient Prescriptions on File Prior to Encounter   Medication Sig    omeprazole (PRILOSEC OTC) 20 MG Enteric-Coated tablet Take 20 mg by mouth daily.       Allergies   Allergen Reactions    Pcn [Penicillins] Anaphylaxis         Review of Systems:  Review of Systems   Gastrointestinal: Positive for abdominal pain and vomiting.     All other systems reviewed and negative unless otherwise noted in the HPI or above. This was done per my custom and practice for systems appropriate to the chief complaint in an emergency department setting and varies depending on the quality of history that the patient is able to provide.      Physical Exam:   07/24/17  1228 07/24/17  1600 07/24/17  1658 07/24/17  1730   BP: 160/96 (!) 137/109 153/90    Pulse: 62 60 64 62   Resp: _0 Temp: 98 F (36.7 C) 97.9 F (36.6 C)     SpO2: 100% 99%  100%     Nursing Note(s) and vitals reviewed.  Physical Exam   Constitutional: She is oriented to person, place, and time. She appears well-developed and well-nourished. She appears distressed (Mild, secondary to pain).   Obese elderly female, lying in bed in mild distress.   HENT:   Head: Normocephalic and atraumatic.   Right Ear: External ear normal.   Left Ear: External ear normal.   Nose: Nose normal.   Mouth/Throat: Oropharynx is clear and moist.   Eyes: EOM are normal. Right eye exhibits no discharge. Left eye exhibits no discharge. No scleral icterus.   Neck: Normal range of motion. No tracheal deviation present.   Cardiovascular: Normal rate, regular rhythm, normal heart sounds and intact distal pulses.  Exam reveals no gallop and no friction rub.    No murmur heard.  Pulmonary/Chest: Effort normal and breath sounds normal. No stridor. No respiratory distress. She has no wheezes. She has no rales. She exhibits no tenderness.   Abdominal: Soft. Bowel sounds are normal. She exhibits no distension. There is  tenderness (Diffuse, moderate pain, localizing somewhat to the right upper quadrant and epigastrium). There is no rebound and no guarding.   Evidence of well-healed surgical scar around the umbilicus   Musculoskeletal: She exhibits edema (In the bilateral feet, reportedly chronic). She exhibits no tenderness or deformity.   Neurological: She is alert and oriented to person, place, and time. Coordination (Left-sided arm and leg weakness, reportedly chronic after stroke) abnormal.   Skin: Skin is warm and dry. She is not diaphoretic. No erythema.   Psychiatric: She has a normal mood and affect. Her behavior is normal. Judgment and thought content normal.   Patient is irritable but redirectable in conversation.   Vitals reviewed.      Impression and Initial ED Plan:  66 year old female presents with abdominal pain, nausea, and vomiting, concerning for possible cholecystitis, pancreatitis, or other acute intra-abdominal pathology.  Patient will be further evaluated and managed with the following:  Standard labs including CBC and CMP to assess for hematologic, electrolyte, and hepatic abnormalities  Lipase to check for pancreatitis  Zofran for nausea and morphine for pain  CT abdomen pelvis with contrast to assess for intra-abdominal pathologies    The rest of the ED course, results, and plan for the patient are listed below in the Work-Up Review and/or in a separate Progress Note.  Please see that note for additional details.     I have discussed my evaluation and care plan with the attending, Dr. Burnadette Peter, Carylon Perches, MD.      Kathreen Cornfield, MD, MPH  Emergency Medicine PGY2    The Date of Service for the Emergency Room encounter is 07/24/2017  1:57 AM       *This note was dictated with speech-to-text software.  Please excuse any typos, and contact provider directly if questions arise regarding any of the information  herein.  ----------------------------------------------------------------------------------------------------------------------      WORK-UP REVIEW:  Workup Review   Alethia Melendrez B's Documentation   Comment Time   Patient was discussed with oncoming ED team and will be signed out to the senior resident for ongoing evaluation and care.  Patient is clinically stable at this time. 10/12 0723   Patient's CT scan was performed and demonstrated gallbladder mass with focally thickened wall and intraluminal gas vs fat globules.  Patient will be started on IV antibiotics for presumed cholecystitis and will be discussed with General Surgery for possible admission. 10/12 0548   Patient's labs have started to result and demonstrate no  leukocytosis or anemia, a potassium of 2.9 that will be repleted now, and new transaminitis, bilirubinemia and elevated alk phos concerning for cholecystitis versus hepatitis.  Patient has a 22 gauge IV which will likely not be able to administer contrast for her CT scan.  She had only refuses both placement of a different larger IV and understands that this may limit her ability to have an adequate CT scan. 10/12 0509        Sharlet Salina, MD  Resident  07/24/17 1900       Allena Katz, MD  07/25/17 [phone]

## 2017-07-24 NOTE — Consults (Signed)
INITIAL CONSULT NOTE    Request for Consultation:   Asked by Maurine Minister, MD to evaluate this patient for cholecystitis.    Primary Care Physician:  Michaelle Birks      History of Present Illness:     Emily Ball is a 66 year old female with a complex PMH notable for prior CVA about 10 years ago, leaving the patient hemiparetic and Stage IV uterine cancer who presents for evaluation of abdominal pain. Per patient she developed LUQ/epigastric pain last night radiating to her back. Also had nausea and bilious vomiting. Denies fevers, chills. Initially thought the pain was acid reflux pain- but when it did not resolve became concerned and came to ED for evaluation. No changes in bowel or bladder habits. No history of biliary colic.     Past Medical History:  - HTN  - Morbid Obesity  - Prior CVA with L-sided Hemiplegia, now bed-bound with caregiver to assist with ADLs   - Endometrial Stage IV [place]   - Anxiety  - Depression  - History of MI x 3 with stent placement  - Diverticulosis  - OSA    Past Surgical History:   - TAH/BSO for Endometrial [place] 2009  - Previous tracheostomy       Allergies:  Allergies   Allergen Reactions    Pcn [Penicillins] Anaphylaxis       Prior to Admission Medications:    (Not in a hospital admission)    Current Inpatient Medications:   albuterol  2-20 puff As Directed    aluminum-magnesium-simethicone  30 mL Once    gabapentin  100 mg BID    latanoprost  1 drop HS    lidocaine  10 mL Once    pantoprazole  40 mg Daily      ciprofloxacin (CIPRO) IVPB      metroNIDAZOLE      sodium chloride 0.9%-potassium chloride 20 mEq 75 mL/hr at 07/24/17 1142      albuterol  2-20 puff PRN       Social History:  Social History     Social History    Marital status: Other     Spouse name: N/A    Number of children: N/A    Years of education: N/A     Social History Main Topics    Smoking status: Not on file    Smokeless tobacco: Not on file    Alcohol use Not on file    Drug  use: Not on file    Sexual activity: Not on file     Social Activities of Daily Living Present    Not on file     Social History Narrative       Family History:  No family history on file.    Review of Systems:  A complete ROS was conducted and is negative except as per HPI     Physical Exam:  Temperature:  [97.7 F (36.5 C)-98 F (36.7 C)] 98 F (36.7 C) (10/12 1228)  Blood pressure (BP): (152-184)/(72-97) 160/96 (10/12 1228)  Heart Rate:  [60-76] 62 (10/12 1228)  Respirations:  [16-22] 18 (10/12 1228)  Pain Score: Patient Sleeping, Respiratory Assessment Done (10/12 1228)  O2 Device: None (Room air) (10/12 1228)  SpO2:  [94 %-100 %] 100 % (10/12 1228)  General:  Pleasant woman in NAD  HEENT:  NCAT, EOMI   Neck:  Supple  Well healed scar from prior tach  Lungs:  nonlabored breathing symmetrical chest wall expansion  bilaterally  CV:  RRR palpable radial pulse  Abdomen:  Soft, mildly TTP in LUQ and epigastrium. - murphy's sign. Well healed midline scar from prior hysterectomy, transverse scar in epigastrium from g tube  Extremities:  WWP   Neuro:  +hemiparesis LUE, LLE (sensation intact), GCS 15    Labs and Other Data:  Lab Results   Component Value Date    NA 140 07/24/2017    K 2.9 (L) 07/24/2017    CL 101 07/24/2017    BICARB 29 07/24/2017    BUN 7 (L) 07/24/2017    CREAT 0.45 (L) 07/24/2017    GLU 156 (H) 07/24/2017    [place] 8.9 07/24/2017     Lab Results   Component Value Date    WBC 6.3 07/24/2017    HGB 13.1 07/24/2017    HCT 41.8 07/24/2017    PLT 194 07/24/2017    SEG 66 07/24/2017    LYMPHS 28 07/24/2017    MONOS 6 07/24/2017    EOS 1 07/24/2017     Lab Results   Component Value Date    AST 379 (H) 07/24/2017    ALT 209 (H) 07/24/2017    ALK 226 (H) 07/24/2017    TBILI 1.66 (H) 07/24/2017    TP 8.3 (H) 07/24/2017    ALB 3.2 (L) 07/24/2017       CT a/p and ultrasound showing gallbladder wall thickening and cholelithiasis, small amount of pericholecystic fluid. CBD 8 mm    MRCP: CBD 8 mm and 4 mm stone at  ampulla       Assessment:  10 F with abdominal pain. Labs showing hyperbilirubinemia in the setting of normal WBCs. Although imaging potentially concerning for cholecystitis, patient's exam and labs not especially consistent with this diagnosis. MRI showing stone at ampulla and dilated CBD.     Recommendations:  ERCP to characterize CBD and extract stones  Surgery to follow- further recommendations pending completion of ERCP     Discussed with:  Dr. Dema Severin     Note Author: Georgiann Cocker [place], 07/24/17, 3:43 PM    ATTENDING: Pt seen and examined this am in ED.  Agree with above.  Imaging personally reviewed.  She has epigastric pain radiating to back.  No prior hx of sx's c/w biliary colic.  CT with multiple filling defects within GB and cystic duct that appear to contain air (vs fat).  No sig bil dil, although there is tortuosity and fullness in region of cystic duct. U/S with small amount of pericholy fluid and "equivocal" Murphy's, so possibly c/w cholecystitis. LFTs abnormal, so we recommended MRCP to better evaluate CBD.  This demonstrated small stone near ampulla.  It is unusual to have both cholecystitis and choledocholithiasis at same time.  Recommend GI consult for ERCP.  We willl continue to follow.  She is extremely high risk medically so may not warrant lap choly.

## 2017-07-24 NOTE — Interdisciplinary (Signed)
MD notified regarding pt not tolerating first bag of IV potassium, will continue to encourage and infuse at slower rate if pt willing to tolerate. Three PIV insertion attempts this evening for second IV access, all attempts unsuccessful, CCU RN will attempt via [place] due to pt being difficult stick. Will continue to monitor.

## 2017-07-24 NOTE — ED MD Progress Note (Signed)
66 year old with a history of CVA with left-sided residual weakness, bed-bound, presenting today with 1 day of abdominal pain, primarily upper abdomen, associated with nonbloody nonbilious vomiting. Afebrile, not tachycardic, hypertensive.  Abdomen is tender in the epigastrium, left upper quadrant right upper quadrant, difficult exam due to patient's habitus.  Slightly dry mucous membranes.  Labs have elevated bilirubin, transaminitis, elevated alk-phos, all concerning for possible biliary pathology.  Given patient's habitus, ultrasound is likely to be technically difficult, so will start with the CT abdomen pelvis, if that is not conclusive, will consider ultrasound.

## 2017-07-25 ENCOUNTER — Encounter (HOSPITAL_COMMUNITY)
Admission: EM | Disposition: A | Discharge status: Home Health | Payer: Self-pay | Source: Emergency Department | Attending: Hospitalist

## 2017-07-25 LAB — COMPREHENSIVE METABOLIC PANEL, BLOOD
ALT (SGPT): 232 U/L — ABNORMAL HIGH (ref 0–33)
AST (SGOT): 213 U/L — ABNORMAL HIGH (ref 0–32)
Albumin: 3 g/dL — ABNORMAL LOW (ref 3.5–5.2)
Alkaline Phos: 263 U/L — ABNORMAL HIGH (ref 35–140)
Anion Gap: 8 mmol/L (ref 7–15)
BUN: 8 mg/dL (ref 8–23)
Bicarbonate: 31 mmol/L — ABNORMAL HIGH (ref 22–29)
Bilirubin, Tot: 2.5 mg/dL — ABNORMAL HIGH (ref <1.2)
Calcium: 8.9 mg/dL (ref 8.5–10.6)
Chloride: 99 mmol/L (ref 98–107)
Creatinine: 0.73 mg/dL (ref 0.51–0.95)
GFR: >60 mL/min
Glucose: 93 mg/dL (ref 70–99)
Potassium: 4.2 mmol/L (ref 3.5–5.1)
Sodium: 138 mmol/L (ref 136–145)
Total Protein: 7.3 g/dL (ref 6.0–8.0)

## 2017-07-25 LAB — MAGNESIUM, BLOOD: Magnesium: 2 mg/dL (ref 1.6–2.4)

## 2017-07-25 LAB — CBC WITH DIFF, BLOOD
ANC-Automated: 2.9 10*3/uL (ref 1.6–7.0)
Abs Lymphs: 1.5 10*3/uL (ref 0.8–3.1)
Abs Monos: 0.4 10*3/uL (ref 0.2–0.8)
Eosinophils: 1 %
Hct: 39 % (ref 34.0–45.0)
Hgb: 12.5 gm/dL (ref 11.2–15.7)
Lymphocytes: 30 %
MCH: 31.3 pg (ref 26.0–32.0)
MCHC: 32.1 g/dL (ref 32.0–36.0)
MCV: 97.5 um3 — ABNORMAL HIGH (ref 79.0–95.0)
MPV: 11.5 fL (ref 9.4–12.4)
Monocytes: 9 %
Plt Count: 218 10*3/uL (ref 140–370)
RBC: 4 10*6/uL (ref 3.90–5.20)
RDW: 14.3 % — ABNORMAL HIGH (ref 12.0–14.0)
Segs: 60 %
WBC: 4.8 10*3/uL (ref 4.0–10.0)

## 2017-07-25 LAB — PROTHROMBIN TIME, BLOOD
INR: 1.2
PT,Patient: 13.7 s — ABNORMAL HIGH (ref 9.7–12.5)

## 2017-07-25 LAB — PHOSPHORUS, BLOOD: Phosphorous: 3.6 mg/dL (ref 2.7–4.5)

## 2017-07-25 SURGERY — ENDOSCOPIC RETROGRADE CHOLANGIOPANCREATOGRAPHY (ERCP)
Anesthesia: Monitored Anesthesia Care (MAC)

## 2017-07-25 MED ORDER — SODIUM CHLORIDE 0.9 % IV BOLUS
1000.0000 mL | INJECTION | Freq: Once | INTRAVENOUS | Status: AC
Start: 2017-07-25 — End: 2017-07-26

## 2017-07-25 MED ORDER — CIPROFLOXACIN HCL 500 MG OR TABS
500.0000 mg | ORAL_TABLET | Freq: Two times a day (BID) | ORAL | Status: DC
Start: 2017-07-25 — End: 2017-07-25
  Administered 2017-07-25: 500 mg via ORAL
  Filled 2017-07-25: qty 1

## 2017-07-25 MED ORDER — METRONIDAZOLE IN NACL 5-0.79 MG/ML-% IV SOLN
500.0000 mg | Freq: Three times a day (TID) | INTRAVENOUS | Status: DC
Start: 2017-07-25 — End: 2017-07-27
  Administered 2017-07-25 – 2017-07-27 (×5): 500 mg via INTRAVENOUS
  Filled 2017-07-25 (×5): qty 100

## 2017-07-25 MED ORDER — METRONIDAZOLE 500 MG OR TABS
500.0000 mg | ORAL_TABLET | Freq: Three times a day (TID) | ORAL | Status: DC
Start: 2017-07-25 — End: 2017-07-25
  Administered 2017-07-25: 500 mg via ORAL
  Filled 2017-07-25: qty 1

## 2017-07-25 MED ORDER — LORAZEPAM 2 MG/ML OR CONC
0.5000 mg | Freq: Once | ORAL | Status: DC | PRN
Start: 2017-07-25 — End: 2017-07-28

## 2017-07-25 MED ORDER — CIPROFLOXACIN IN D5W 400 MG/200ML IV SOLN
400.0000 mg | Freq: Two times a day (BID) | INTRAVENOUS | Status: DC
Start: 2017-07-26 — End: 2017-07-26
  Filled 2017-07-25: qty 200

## 2017-07-25 NOTE — Interdisciplinary (Signed)
[place] None: Patient has no IV to start bolus.  Nurses attempted 7x to start an IV and she refused ultrasound to start an IV.  Would a central line be considered for this patient since she needs abx and fluids?  Thank you.

## 2017-07-25 NOTE — Progress Notes (Signed)
Gastroenterology Daily Progress Note:    Current Hospital Stay:   1 day - Admitted on: 07/24/2017    Consult Attending: Dr. Rozelle Logan  07/25/17        Overnight Events/Subjective:   -Lost IV access overnight. Otherwise has no complains. Seen at bedside with Daughter. Patient is in a good mood, and making jokes, she has no abdominal pain, fevers or chills    Current Facility-Administered Medications   Medication    acetaminophen (TYLENOL) tablet 650 mg    Or    acetaminophen (TYLENOL) solution 650 mg    Or    acetaminophen (TYLENOL) suppository 650 mg    albuterol (PROVENTIL) 5 MG/ML nebulizer solution 2.5 mg    ciprofloxacin (CIPRO) IVPB 400 mg    gabapentin (NEURONTIN) capsule 100 mg    heparin injection 5,000 Units    latanoprost (XALATAN) 0.005 % ophthalmic solution 1 drop    LORazepam (ATIVAN) solution 0.5 mg    metoclopramide (REGLAN) injection 10 mg    metroNIDAZOLE (FLAGYL) IVPB 500 mg    morphine injection 2 mg    morphine injection 2 mg    morphine injection 4 mg    nalOXone (NARCAN) injection 0.1 mg    pantoprazole (PROTONIX) injection 40 mg    sodium chloride (PF) 0.9 % flush 3 mL    sodium chloride (PF) 0.9 % flush 3 mL    sodium chloride 0.9 % bolus 1,000 mL    sodium chloride 0.9 % TKO infusion       Allergies   Allergen Reactions    Pcn [Penicillins] Anaphylaxis    Strawberry Rash    Sulfa Drugs Rash    Aspirin-Dipyridamole Unspecified     pruritus    Nitrous Oxide Other     Gets paranoid from, given at dentist before    Trazodone Other     Nightmares    Zolpidem Unspecified     Doesn't help sleep and gets anxious       Objective:  Vital Signs:  Temperature:  [97.9 F (36.6 C)-98.6 F (37 C)] 98.2 F (36.8 C) (10/13 1116)  Blood pressure (BP): (93-155)/(67-109) 93/71 (10/13 1116)  Heart Rate:  [60-73] 62 (10/13 1116)  Respirations:  [14-27] 16 (10/13 1116)  Pain Score: 0 (10/13 1116)  O2 Device: None (Room air) (10/13 1116)  SpO2:  [93 %-100 %] 100 % (10/13 1116)  Wt Readings  from Last 1 Encounters:   07/24/17 133.7 kg (294 lb 12.1 oz)       Intake/Output (Current Shift):  10/12 0600 - 10/13 0559  In: 700 [P.O.:400; I.V.:300]  Out: 1350 [Urine:1350]    PHYSICAL EXAM  General: Obese, lady, not in acute distress, comfortable  HEENT: Scleral icterus not present, PERRLA, oropharynx clear  Cardiovascular:S1 S2, RRR   Pulm: clear to auscultation anteriorly  Gastrointestinal: +BS, soft, nontender, nondistended.   Ext: Nil pedal  edema   Neuro: A&O x3, no asterixis    Labs:  Recent Labs      07/24/17   0429  07/24/17   1737  07/25/17   0636   NA  140  143  138   K  2.9*  2.5*  4.2   CL  101  107  99   BICARB  29  26  31*   BUN  7*  5*  8   CREAT  0.45*  0.40*  0.73   [place]  8.9  6.8*  8.9   MG  1.9   --  2.0   PHOS  3.1   --   3.6   TP  8.3*  6.3  7.3   ALB  3.2*  2.7*  3.0*   TBILI  1.66*  1.79*  2.50*   AST  379*  247*  213*   ALT  209*  208*  232*   ALK  226*  204*  263*       Recent Labs      07/24/17   0231  07/25/17   0636   WBC  6.3  4.8   HGB  13.1  12.5   HCT  41.8  39.0   MCV  98.4*  97.5*   PLT  194  218   SEG  66  60   LYMPHS  28  30   MONOS  6  9   EOS  1  1       Recent Labs      07/25/17   0637   INR  1.2       Imaging:  07/24/2017 MRI/MRCP    FINDINGS:  LUNG BASES: Subsegmental atelectasis.  LIVER: Unremarkable  BILIARY: Cholelithiasis, sludge, submucosal edema of the gallbladder. Fundal adenomyomatosis. Apparent small stones in the cystic duct as well as within the common bile duct causing mild obstruction. Significant peribiliary perfusion abnormalities with bile duct enhancement suggesting cholangitis. 8 mm dilation of the common bile duct. 4 mm stone at the ampulla (series 14, image 45).  PANCREAS: Unremarkable  SPLEEN: Unremarkable  ADRENALS: Unremarkable  KIDNEYS: Bilateral foci of T2 hyperintensity, too small to characterize.  VASCULATURE: Unremarkable  LYMPHATIC: Unremarkable  SMALL BOWEL: Visualized portions are unremarkable.  LARGE BOWEL: Visualized portions are  unremarkable.  MESENTERY / OMENTUM: Unremarkable  PERITONEAL CAVITY: Unremarkable  ABDOMINAL WALL: Unremarkable  BONES: Unremarkable      Assessment and Plan:   Emily Ball is a 66 year old F [employer] with CAD, CVA, morbid obesity, admitted for abdominal pain, found to have elevated LFTs. MRI/MRCP 10/12 shows CBD dilation to 73m, and 480mstone at the ampulla.  ALP 263,ALT 232,AST 213, T.bili 1.7-->2.5  Having difficulties securing IV access for patient today, so had not received her IV antibiotics at time of round.  Otherwise she has no complains, no fevers, leukocytosis or chills  Planned for an ERCP tomorrow morning with anesthesia ( timing will depend on anesthesia availability)  Keep NPO after midnight for procedure  Trend LFTS  Check INR tomorrow am    Plan discussed with attending physician, Dr. KwArdath SaxGastroenterology Fellow  Pager: [phone]

## 2017-07-25 NOTE — Plan of Care (Signed)
Problem: Infection  Goal: Absence of infection signs and symptoms  Outcome: Progressing      Problem: Nausea/Vomiting  Goal: Absence of nausea  Outcome: Progressing  Pt had emesis episode this evening, declined reglan ordered, will continue to monitor. Pt now denies n/v post emesis episode, will continue to monitor.     Problem: Tissue Perfusion, Cardiopulmonary - Altered  Goal: Hemodynamic stability  Outcome: Progressing  Pt NSR on cardiac monitor, denies palpitations and SOB. VSS, current BP 131/67 (BP Location: Right arm, BP Patient Position: Semi-Fowlers)   Pulse 67   Temp 98 F (36.7 C)   Resp 17   Ht 5\' 9"  (1.753 m)   Wt 133.7 kg (294 lb 12.1 oz)   SpO2 95%   BMI 43.53 kg/m2. Will continue to monitor closely.

## 2017-07-25 NOTE — Progress Notes (Signed)
Medicine Progress Note     ID: Emily Ball is a 66 year old female with PMH significant for metastatic uterine cancer s/p TAH/BSO 09/13/2007, completed radiation 12/2007, hypertension, peripheral neuropathy, morbid obesity, bed bound due to L hemiplegia and morbid obesity. Patient presents with abdominal pain and found to have acute cholecystitis.    INTERVAL:  Peripheral access lost and patient refusing further attempts    SUBJECTIVE:   Patient is very upset about additional attempts for vascular access. Denies any current abdominal pain, fevers or chills, nausea or vomiting. Patient would like to wait for her home caregiver to come before attempting another peripheral line. Long discussion with patient about reasons why peripheral IV necessary including ERCP and IV antibiotics.      OBJECTIVE:  Vital Signs: Temperature:  [97.9 F (36.6 C)-98.6 F (37 C)] 98.2 F (36.8 C) (10/13 1116)  Blood pressure (BP): (93-160)/(67-109) 93/71 (10/13 1116)  Heart Rate:  [60-73] 62 (10/13 1116)  Respirations:  [14-27] 16 (10/13 1116)  Pain Score: 0 (10/13 1116)  O2 Device: None (Room air) (10/13 1116)  SpO2:  [93 %-100 %] 100 % (10/13 1116)    Intake and Output  10/12 0600 - 10/13 0559  In: 700 [P.O.:400; I.V.:300]  Out: 1350 [Urine:1350]    Wt Readings from Last 3 Encounters:   07/24/17 133.7 kg (294 lb 12.1 oz)   06/02/17 113.4 kg (250 lb)       Physical Exam:  General: obese female, crying when talking about attempts for peripheral access  HEENT: non-icteric sclera, PEERL, MMM  Neck: No cervical LAD  Lungs: Distance breath sounds, no audible wheezes, crackles, or ronchi, no increased work of breathing  CV: RRR, nml S1/S2, no r/m/g  GI: soft, NT, ND, normal BS, negative murphys sign  GU: no suprapubic pain  Ext: warm and well perfused, peripheral pulses intact bilaterally, no edema  Neuro: CN II-IX grossly intact without focal deficits, L sided hemiplegia     Meds:  Current Facility-Administered Medications   Medication      acetaminophen (TYLENOL) tablet 650 mg    Or    acetaminophen (TYLENOL) solution 650 mg    Or    acetaminophen (TYLENOL) suppository 650 mg    albuterol (PROVENTIL) 5 MG/ML nebulizer solution 2.5 mg    ciprofloxacin (CIPRO) IVPB 400 mg    gabapentin (NEURONTIN) capsule 100 mg    heparin injection 5,000 Units    latanoprost (XALATAN) 0.005 % ophthalmic solution 1 drop    LORazepam (ATIVAN) solution 0.5 mg    metoclopramide (REGLAN) injection 10 mg    metroNIDAZOLE (FLAGYL) IVPB 500 mg    morphine injection 2 mg    morphine injection 2 mg    morphine injection 4 mg    nalOXone (NARCAN) injection 0.1 mg    pantoprazole (PROTONIX) injection 40 mg    sodium chloride (PF) 0.9 % flush 3 mL    sodium chloride (PF) 0.9 % flush 3 mL    sodium chloride 0.9 % bolus 1,000 mL    sodium chloride 0.9 % TKO infusion       Labs:  Recent Labs      07/24/17   0231  07/25/17   0636   WBC  6.3  4.8   HGB  13.1  12.5   HCT  41.8  39.0   MCV  98.4*  97.5*   PLT  194  218   SEG  66  60   LYMPHS  28  30   MONOS  6  9   EOS  1  1        Recent Labs      07/24/17   0429  07/24/17   1737  07/25/17   0636   NA  140  143  138   K  2.9*  2.5*  4.2   CL  101  107  99   BICARB  29  26  31*   BUN  7*  5*  8   CREAT  0.45*  0.40*  0.73   [place]  8.9  6.8*  8.9   MG  1.9   --   2.0   PHOS  3.1   --   3.6       Recent Labs      07/24/17   0429  07/24/17   1737  07/25/17   0636   TP  8.3*  6.3  7.3   ALB  3.2*  2.7*  3.0*   TBILI  1.66*  1.79*  2.50*   AST  379*  247*  213*   ALT  209*  208*  232*   ALK  226*  204*  263*       Recent Labs      07/25/17   0637   PT  13.7*   INR  1.2       Microbiology:   None     Radiology     RUQ [place] 07/24/17  IMPRESSION:  Multiple floating gallstone with findings suspicious for acute cholecystitis. If there is clinical concern, hepatobiliary scintigraphy can be performed to confirm the diagnosis.    No biliary duct dilation.    CT A/P 07/24/17  IMPRESSION:  CT scan of the abdomen and pelvis with IV  contrast.    1. Cholelithiasis with possible areas of gallbladder wall thickening and pericholecystic fluid between the gallbladder and liver without definite pericholecystic fat stranding or extrahepatic bile duct dilation. Findings are equivocal for cholecystitis. Recommend ultrasound to further evaluate.    2. Bilateral lobulated kidneys with many subcentimeter hypoattenuating lesions, too small to characterize.    3. Hepatomegaly, non-specific    MRCP 07/24/17   IMPRESSION:  Findings suspicious for acute cholecystitis and cholangitis.    ASSESSMENT and PLAN:  Emily Ball is a 66 year old female with PMH significant for metastatic uterine cancer s/p TAH/BSO 09/13/2007, completed radiation 12/2007, hypertension, peripheral neuropathy, morbid obesity, bed bound due to L hemiplegia and morbid obesity. Patient presents with abdominal pain, nausea and bilious vomiting and found to have acute cholecystitis.     #Acute cholecystitis with cholelithiasis: patient presented with acute onset abdominal pain, f/c, with CT A/P and RUQ [place] with cholelithiasis and equivocal findings for cholecystitis. MRCP with CBD stones. Elevated LFTs but without leukocytosis. Given ciprofloxacin and flagyl in ED. Has not been able to receive more antibiotics due to lack of access. Patient without abdominal pain today.   - PO antibiotics   - Continue IV antibiotics once IV access is achieved   - GI consulted appreciate recs    - Plan to take for ERCP tomorrow due to anesthesia availability  - Surgery consulted, appreciate input    - Rec for ERCP with GI, extremely high risk for surgery    #Hypokalemia: patient came in with potassium of 2.5 with PVC, replaced and responded to 4.2.   - Daily lytes    Chronic conditions:   #Hx metastatic uterine cancer  s/p TAH/BSO 09/13/2007, completed radiation 12/2007  #HTH  #H/o stroke: bed bound due to L hemiplegia, late effect of stroke and morbid obesity  #Hypothyrodism  #iron deficiency  anemia  #GERD  Continue outpatient medications: albuterol, gabapentin, latanoprost, hold aspirin  Unfortunately, patient does not remember her dose of synthroid, hydrocodone and name and dose of "anxiety" medication - requested records       #FEN: Diet NPO Are oral medications allowed while the patient is NPO? No; Are enteral medications allowed while patient is NPO? No; Reason for NPO status: Patient NPO for procedure; Please specify procedure: ERCP in am  #GI: PPI   #PPx: holding SQH for GI procedure     #Dispo: Pending GI procedure       #Code Status: DNAR - Full Care    Pt seen and discussed with attending Dr. Desiree Lucy MD  Internal Medicine PGY1  Pager [phone]        Attending Physician Attestation:    I have personally seen and examined the patient, reviewed the medical history and computerized patient record in detail and discussed the pertinent findings, assessments and treatment plans with the resident.  I agree with the findings and treatment plan as presented in the resident note.    Subjective    Admitted for abdominal pain with c/f cholangitis and cholecystitis but patient now without abdominal pain. She had peripheral IV in the ER but this was lost, after multiple attempts patietn is now refusing further iv access. We discussed thsi with her and she is willing to try ultrasound guided attempt and will receive ativan prior to this. thankfulyl she is no longer having abdominal pain    Objective    Vital signs reviewed in EMR.        Labs reviewed in EMR.    Assessment / Plan    1. cholecystitis vs cholangitis - cipro/flagyl, with plan for ercp tmw. Surgery following regarding possible cholecystitis but patient with no murphy's sign        Rhona Leavens, [facility] Medicine  07/26/17, 2:04 PM

## 2017-07-25 NOTE — Interdisciplinary (Addendum)
Patient still has no IV access.  MD aware. Still no ED nurse to start IV with ultrasound.

## 2017-07-25 NOTE — Plan of Care (Signed)
Problem: Infection  Goal: Absence of infection signs and symptoms  Outcome: Progressing  Monitoring patient's labs.  Patient assessed for s/s of acute infection. Patient remains afrebile. Patient denies any fever or chills, and skin remains warm and dry. Labs monitored daily.   Results for Emily Ball, Emily Ball (MRN [id number]) as of 07/25/2017 11:27   Ref. Range 07/25/2017 06:36   WBC Latest Ref Range: 4.0 - 10.0 1000/mm3 4.8     Patient instructed to report any changes including presence of fever, chills or malaise. Will continue to assess and monitor.       Problem: Nausea/Vomiting  Goal: Absence of nausea    Intervention: Nausea/vomiting assessment  Patient has not complained of nausea.  Will provide medication per MD orders.  Will continue to assess and monitor.          Problem: Tissue Perfusion, Cardiopulmonary - Altered  Goal: Hemodynamic stability  Outcome: Progressing  Patient does not complain of chest pain, SOB, palpitations, or dizziness. Patient's vital signs are as follows:    07/25/17  0300 07/25/17  0757 07/25/17  0853 07/25/17  1116   BP: 133/87  139/70 93/71   Pulse: 64 65 65 62   Resp: 27 14 20 16    Temp: 97.9 F (36.6 C)  98.1 F (36.7 C) 98.2 F (36.8 C)   SpO2: 93%  99% 100%     Patient is on room air.  Patient being monitored on telemetry: NSR.  Will continue to assess and monitor.

## 2017-07-25 NOTE — Interdisciplinary (Signed)
CCU RN attempted to place PIV with US guided machine, pt refusing placement at this time, will continue to encourage IV placement.

## 2017-07-25 NOTE — Interdisciplinary (Signed)
[place] None: CCU does not have any RNs who are certified to  start an IV with ultrasound.  We have notified the RN Supervisor to see if anyone in ED can start an IV with ultrasound. Still awaiting for response.

## 2017-07-25 NOTE — Interdisciplinary (Signed)
[place] None: ED does not have any nurses that can start ultrasound guided IV.

## 2017-07-25 NOTE — Progress Notes (Signed)
Surgery Daily Progress Note:   07/25/17     Current Hospital Stay:   1 day - Admitted on: 07/24/2017    Emily Ball is a 66 year old presenting with abdominal pain that was found with hyperbilirubinemia in the setting of normal WBCs. Although imaging potentially concerning for cholecystitis, patient's exam and labs not especially consistent with this diagnosis. MRI showing stone at ampulla and dilated CBD.      24hr/Subjective:  NAEON  Afebrile and HD stable  Had an episode of emesis after she got oral K  Patient reports painful IV line this am  Abdominal pain is improved  Passing flatus/BMS    Objective:    Vital Signs:  Temperature:  [97.9 F (36.6 C)-98.6 F (37 C)]   Heart Rate:  [60-73]   Blood pressure (BP): (131-160)/(67-109)   Respirations:  [14-27]   SpO2:  [93 %-100 %]   O2 Device: None (Room air)    Intake/Output (Current Shift):  In: 73 [P.O.:400; I.V.:300]  Out: 1350 [Urine:1350]    Lines:   .    Active PICC Line / CVC Line / PIV Line / Drain / Airway / Intraosseous Line / Epidural Line / ART Line / Line Type / Wound / Nasogastric/Orogastric tube / Urethral Catheter / Pressure Ulcer Injury     Name: Placement date: Placement time: Site: Days:    External Device 07/24/17   2300      less than 1                Meds:  ciprofloxacin (CIPRO) IVPB, 400 mg, Q12H    gabapentin, 100 mg, BID    heparin, 5,000 Units, Once    latanoprost, 1 drop, HS    metroNIDAZOLE, 500 mg, Q8H    pantoprazole, 40 mg, Daily    sodium chloride (PF), 3 mL, Q8H    sodium chloride, 1,000 mL, Once    Physical Exam:   Gen: NAD, resting comfortably in bed  CV: RRR  Lungs: normal work of breathing on room air, saturates well on RA  Abd: soft, mildly tender to palpation to LUQ, non-distended  Ext: WWP, no peripheral edema    Laboratory data:   WBC/HGB/HCT/PLT:  4.8/12.5/39.0/218 (10/13 0636)   BMP:  138/4.2/99/--/8/0.73/93 (10/13 0636)  Mg/Phos:  2.0/3.6 (10/13 1610)  Liver Panel:  213/232/2.50/--/263/7.3/3.0 (10/13  0636)  INR/PTT:  1.2/-- (10/13 9604)  No results found for: ARTPH, ARTPO2, ARTPCO2  No results found for: PH, PO2, PCO2      Imaging:  MRI abdomen 10/12:  FINDINGS:  LUNG BASES: Subsegmental atelectasis.  LIVER: Unremarkable  BILIARY: Cholelithiasis, sludge, submucosal edema of the gallbladder. Fundal adenomyomatosis. Apparent small stones in the cystic duct as well as within the common bile duct causing mild obstruction. Significant peribiliary perfusion abnormalities with bile duct enhancement suggesting cholangitis. 8 mm dilation of the common bile duct. 4 mm stone at the ampulla (series 14, image 45).  PANCREAS: Unremarkable  SPLEEN: Unremarkable  ADRENALS: Unremarkable  KIDNEYS: Bilateral foci of T2 hyperintensity, too small to characterize.  VASCULATURE: Unremarkable  LYMPHATIC: Unremarkable  SMALL BOWEL: Visualized portions are unremarkable.  LARGE BOWEL: Visualized portions are unremarkable.  MESENTERY / OMENTUM: Unremarkable  PERITONEAL CAVITY: Unremarkable  ABDOMINAL WALL: Unremarkable  BONES: Unremarkable    CONCURRENT SUPERVISION:  I have reviewed the images and agree with the resident/fellow interpretation.        Preliminary created by: Talbert Forest   Signed by: Westley Hummer 07/24/2017 17:21:16  Impression     IMPRESSION:  Findings suspicious for acute cholecystitis and cholangitis.       Assessment and Plan:  This is a 66 year old presenting with abdominal pain that was found with hyperbilirubinemia in the setting of normal WBCs. Although imaging potentially concerning for cholecystitis, patient's exam and labs not especially consistent with this diagnosis. MRI showing stone at ampulla and dilated CBD. Overall stable this am.     - Recommend GI consult for ERCP  - Trend LFTs  - We willl continue to follow.  - She is not a good operative candidate so would prefer to treat medically or with sphincterotomy if possible as opposed to lap chole.    Eleftherios Andi Hence, MD/PhD  Surgical  resident

## 2017-07-25 NOTE — Interdisciplinary (Signed)
Pt refused last bag of IV potassium, pt also crying and stating "I need this IV out of my hand, take it out now!" RN explained to pt the importance of having peripheral IV access for medical care, pt still refusing to keep PIV. IV removed at this time, MD notified. Pt also refusing IV placement via US guided at this time, will continue to encourage.

## 2017-07-26 ENCOUNTER — Encounter (HOSPITAL_COMMUNITY)
Admission: EM | Disposition: A | Discharge status: Home Health | Payer: Self-pay | Source: Emergency Department | Attending: Hospitalist

## 2017-07-26 ENCOUNTER — Inpatient Hospital Stay (HOSPITAL_COMMUNITY): Payer: Medicare HMO | Admitting: Anesthesiology

## 2017-07-26 ENCOUNTER — Inpatient Hospital Stay (HOSPITAL_BASED_OUTPATIENT_CLINIC_OR_DEPARTMENT_OTHER): Payer: Medicare HMO

## 2017-07-26 ENCOUNTER — Inpatient Hospital Stay (HOSPITAL_BASED_OUTPATIENT_CLINIC_OR_DEPARTMENT_OTHER): Payer: Medicare HMO | Admitting: Anesthesiology

## 2017-07-26 DIAGNOSIS — I2510 Atherosclerotic heart disease of native coronary artery without angina pectoris: Secondary | ICD-10-CM

## 2017-07-26 DIAGNOSIS — T182XXA Foreign body in stomach, initial encounter: Secondary | ICD-10-CM

## 2017-07-26 DIAGNOSIS — I639 Cerebral infarction, unspecified: Secondary | ICD-10-CM

## 2017-07-26 DIAGNOSIS — K8070 Calculus of gallbladder and bile duct without cholecystitis without obstruction: Secondary | ICD-10-CM

## 2017-07-26 DIAGNOSIS — Z6841 Body Mass Index (BMI) 40.0 and over, adult: Secondary | ICD-10-CM

## 2017-07-26 DIAGNOSIS — K8062 Calculus of gallbladder and bile duct with acute cholecystitis without obstruction: Secondary | ICD-10-CM

## 2017-07-26 DIAGNOSIS — G8190 Hemiplegia, unspecified affecting unspecified side: Secondary | ICD-10-CM

## 2017-07-26 DIAGNOSIS — R7989 Other specified abnormal findings of blood chemistry: Secondary | ICD-10-CM

## 2017-07-26 LAB — COMPREHENSIVE METABOLIC PANEL, BLOOD
ALT (SGPT): 179 U/L — ABNORMAL HIGH (ref 0–33)
AST (SGOT): 131 U/L — ABNORMAL HIGH (ref 0–32)
Albumin: 3.3 g/dL — ABNORMAL LOW (ref 3.5–5.2)
Alkaline Phos: 309 U/L — ABNORMAL HIGH (ref 35–140)
Anion Gap: 12 mmol/L (ref 7–15)
BUN: 10 mg/dL (ref 8–23)
Bicarbonate: 29 mmol/L (ref 22–29)
Bilirubin, Tot: 2.75 mg/dL — ABNORMAL HIGH (ref <1.2)
Calcium: 9.1 mg/dL (ref 8.5–10.6)
Chloride: 99 mmol/L (ref 98–107)
Creatinine: 0.68 mg/dL (ref 0.51–0.95)
GFR: >60 mL/min
Glucose: 104 mg/dL — ABNORMAL HIGH (ref 70–99)
Potassium: 4.1 mmol/L (ref 3.5–5.1)
Sodium: 140 mmol/L (ref 136–145)
Total Protein: 8 g/dL (ref 6.0–8.0)

## 2017-07-26 LAB — CBC WITH DIFF, BLOOD
ANC-Automated: 4.4 10*3/uL (ref 1.6–7.0)
Abs Lymphs: 0.8 10*3/uL (ref 0.8–3.1)
Abs Monos: 0.2 10*3/uL (ref 0.2–0.8)
Eosinophils: 1 %
Hct: 39.9 % (ref 34.0–45.0)
Hgb: 12.9 gm/dL (ref 11.2–15.7)
Lymphocytes: 15 %
MCH: 31.5 pg (ref 26.0–32.0)
MCHC: 32.3 g/dL (ref 32.0–36.0)
MCV: 97.3 um3 — ABNORMAL HIGH (ref 79.0–95.0)
MPV: 11.5 fL (ref 9.4–12.4)
Monocytes: 3 %
Plt Count: 212 10*3/uL (ref 140–370)
RBC: 4.1 10*6/uL (ref 3.90–5.20)
RDW: 14.7 % — ABNORMAL HIGH (ref 12.0–14.0)
Segs: 81 %
WBC: 5.5 10*3/uL (ref 4.0–10.0)

## 2017-07-26 LAB — PROTHROMBIN TIME, BLOOD
INR: 1.2
PT,Patient: 13.5 s — ABNORMAL HIGH (ref 9.7–12.5)

## 2017-07-26 SURGERY — ENDOSCOPIC RETROGRADE CHOLANGIOPANCREATOGRAPHY (ERCP)
Anesthesia: Choice Per Patient on Day of Surgery

## 2017-07-26 MED ORDER — FENTANYL CITRATE (PF) 100 MCG/2ML IJ SOLN
50.0000 ug | INTRAMUSCULAR | Status: DC | PRN
Start: 2017-07-26 — End: 2017-07-26

## 2017-07-26 MED ORDER — SODIUM CHLORIDE 0.9 % IV SOLN
12.5000 mg | Freq: Once | INTRAVENOUS | Status: DC | PRN
Start: 2017-07-26 — End: 2017-07-26

## 2017-07-26 MED ORDER — FAMOTIDINE 20 MG/2ML IV SOLN
INTRAVENOUS | Status: DC | PRN
Start: 2017-07-26 — End: 2017-07-26
  Administered 2017-07-26: 20 mg via INTRAVENOUS

## 2017-07-26 MED ORDER — FENTANYL CITRATE (PF) 250 MCG/5ML IJ SOLN
INTRAMUSCULAR | Status: DC | PRN
Start: 2017-07-26 — End: 2017-07-26
  Administered 2017-07-26 (×2): 50 ug via INTRAVENOUS

## 2017-07-26 MED ORDER — INDOMETHACIN 50 MG RE SUPP
100.0000 mg | Freq: Once | RECTAL | Status: DC
Start: 2017-07-26 — End: 2017-07-26
  Filled 2017-07-26: qty 2

## 2017-07-26 MED ORDER — PHENYLEPHRINE DILUTION 100 MCG/ML IJ SOLN
INTRAVENOUS | Status: DC | PRN
Start: 2017-07-26 — End: 2017-07-26
  Administered 2017-07-26 (×3): 100 ug via INTRAVENOUS
  Administered 2017-07-26: 150 ug via INTRAVENOUS
  Administered 2017-07-26 (×2): 100 ug via INTRAVENOUS

## 2017-07-26 MED ORDER — ENOXAPARIN SODIUM 40 MG/0.4ML SC SOLN
40.0000 mg | Freq: Every day | SUBCUTANEOUS | Status: DC
Start: 2017-07-26 — End: 2017-07-26

## 2017-07-26 MED ORDER — LACTATED RINGERS IV SOLN
INTRAVENOUS | Status: DC
Start: 2017-07-26 — End: 2017-07-26

## 2017-07-26 MED ORDER — HYDRALAZINE HCL 20 MG/ML IJ SOLN
10.0000 mg | Freq: Once | INTRAMUSCULAR | Status: AC
Start: 2017-07-27 — End: 2017-07-28
  Filled 2017-07-26: qty 1

## 2017-07-26 MED ORDER — MEPERIDINE HCL 25 MG/ML IJ SOLN
12.5000 mg | INTRAMUSCULAR | Status: DC | PRN
Start: 2017-07-26 — End: 2017-07-26

## 2017-07-26 MED ORDER — CIPROFLOXACIN HCL 500 MG OR TABS
500.0000 mg | ORAL_TABLET | Freq: Two times a day (BID) | ORAL | Status: DC
Start: 2017-07-26 — End: 2017-07-28
  Administered 2017-07-26 – 2017-07-28 (×5): 500 mg via ORAL
  Filled 2017-07-26 (×5): qty 1

## 2017-07-26 MED ORDER — IOHEXOL 240 MG/ML IJ SOLN
INTRAMUSCULAR | Status: DC | PRN
Start: 2017-07-26 — End: 2017-07-26
  Administered 2017-07-26: 4 mL via INTRAVENOUS

## 2017-07-26 MED ORDER — HYDROMORPHONE HCL 1 MG/ML IJ SOLN
0.5000 mg | INTRAMUSCULAR | Status: DC | PRN
Start: 2017-07-26 — End: 2017-07-26

## 2017-07-26 MED ORDER — LACTATED RINGERS IV SOLN
INTRAVENOUS | Status: DC | PRN
Start: 2017-07-26 — End: 2017-07-26
  Administered 2017-07-26: 09:00:00 via INTRAVENOUS

## 2017-07-26 MED ORDER — FENTANYL CITRATE (PF) 100 MCG/2ML IJ SOLN
25.0000 ug | INTRAMUSCULAR | Status: DC | PRN
Start: 2017-07-26 — End: 2017-07-26

## 2017-07-26 MED ORDER — PROPOFOL 200 MG/20ML IV EMUL
INTRAVENOUS | Status: DC | PRN
Start: 2017-07-26 — End: 2017-07-26
  Administered 2017-07-26: 200 mg via INTRAVENOUS
  Administered 2017-07-26: 50 mg via INTRAVENOUS

## 2017-07-26 MED ORDER — DEXAMETHASONE SODIUM PHOSPHATE 4 MG/ML IJ SOLN (CUSTOM)
INTRAMUSCULAR | Status: DC | PRN
Start: 2017-07-26 — End: 2017-07-26
  Administered 2017-07-26: 8 mg via INTRAVENOUS

## 2017-07-26 MED ORDER — NALOXONE HCL 0.4 MG/ML IJ SOLN
0.1000 mg | INTRAMUSCULAR | Status: DC | PRN
Start: 2017-07-26 — End: 2017-07-26

## 2017-07-26 MED ORDER — INDOMETHACIN 50 MG RE SUPP
100.0000 mg | Freq: Once | RECTAL | Status: AC
Start: 2017-07-26 — End: 2017-07-26
  Administered 2017-07-26: 100 mg via RECTAL
  Filled 2017-07-26: qty 2

## 2017-07-26 MED ORDER — ONDANSETRON HCL 4 MG/2ML IV SOLN
4.0000 mg | Freq: Once | INTRAMUSCULAR | Status: DC | PRN
Start: 2017-07-26 — End: 2017-07-26

## 2017-07-26 MED ORDER — ENOXAPARIN SODIUM 60 MG/0.6ML SC SOLN
60.0000 mg | Freq: Every day | SUBCUTANEOUS | Status: DC
Start: 2017-07-26 — End: 2017-07-28
  Administered 2017-07-27: 60 mg via SUBCUTANEOUS
  Filled 2017-07-26 (×3): qty 1

## 2017-07-26 MED ORDER — DIPHENHYDRAMINE HCL 50 MG/ML IJ SOLN
12.5000 mg | Freq: Once | INTRAMUSCULAR | Status: DC | PRN
Start: 2017-07-26 — End: 2017-07-26

## 2017-07-26 MED ORDER — ROCURONIUM BROMIDE 100 MG/10ML IV SOLN
INTRAVENOUS | Status: DC | PRN
Start: 2017-07-26 — End: 2017-07-26
  Administered 2017-07-26: 50 mg via INTRAVENOUS

## 2017-07-26 SURGICAL SUPPLY — 11 items
BALLOON MULTI 3V EXTRACTION (Misc Medical Supply) ×2 IMPLANT
CATHETER BILIARY DILATION BALLOON HURRICANE 6MMX4CM (Drains/Catheter/Tubes/Reservoir) ×2 IMPLANT
CATHETER POSITIONER HOBBS PUSHER 5FR (Drains/Catheter/Tubes/Reservoir) ×2
INFLATION DEVICE BILIARY ENCORE 26 (Procedural wires/sheaths/catheters/balloons/dilators) ×2
PAD GROUNDING DISPOSAL BX/5 (Misc Surgical Supply) ×2 IMPLANT
STENT COTTON LEUNG 10FR-8FR (Stent) ×2 IMPLANT
STENT FREEMAN PANCREATIC FLEXI-STENT 4FR-5CM, SINGLE PIGTAIL (Stent) ×2 IMPLANT
SYSTEM NAVIFLEX DELIVERY RX 10FR X 202.5CM (Procedural wires/sheaths/catheters/balloons/dilators) ×2 IMPLANT
TRUETOME 39 20MM PRELOAD JAG .025 450CM (Misc Medical Supply) ×2 IMPLANT
WIRE JAGWIRE STRAIGHT TIP .025 X 450CM (Misc Medical Supply) ×2 IMPLANT
WIRE NAIPRO HYDROPHILIC GUIDEWIRE .025 ANGLED (Misc Medical Supply) ×2

## 2017-07-26 NOTE — Anesthesia Preprocedure Evaluation (Addendum)
ANESTHESIA PRE-OPERATIVE EVALUATION    Patient Information    Name: Emily Ball    MRN: [id number]    DOB: August 26, 1951    Age: 66 year old    Sex: female  Procedure(s):  GI ERCP      Pre-op Vitals:   BP 145/79 (BP Location: Right arm, BP Patient Position: Semi-Fowlers)   Pulse 76   Temp 36.8 C   Resp 16   Ht '5\' 9"'$  (1.753 m)   Wt 133.7 kg (294 lb 12.1 oz)   SpO2 100%   BMI 43.53 kg/m2   BMI kg/m2: 43.53 kg/m2    Primary language spoken:  English    ROS/Medical History:       History of Present Illness:  66 year old female with PMH significant for metastatic uterine cancer s/p TAH/BSO 09/13/2007, completed radiation 12/2007, hypertension, peripheral neuropathy, morbid obesity, bed bound due to L hemiplegia and morbid obesity. Patient presents with abdominal pain and found to have acute cholecystitis.    General:  positive for Obesity (bmi 43.5),   Cardiovascular:  hypertension,     Anesthesia History:  negative anesthesia history ROS  history of difficult intubation,  history of difficult IV access,  Pt is a grade 1 view with glide scope but requires a small ETT 5.5 for previous history of trach.  Pt still has an open stoma that never healed. Pulmonary:   negative pulmonary ROS     Neuro/Psych:   TIA/CVA,  Bed bound due to L hemiplegia, late effect of stroke and morbid obesity Hematology/Oncology:   anemia,      GI/Hepatic:  GERD,  liver disease,   Hepatomegaly Infectious Disease:  negative for infectious disease     Renal:  negative renal ROS   Endocrine/Other:  history of thyroid disease,     Pregnancy History:   Pediatrics:         Pre Anesthesia Testing (PCC/CPC) notes/comments:                 Physical Exam    Airway:  Inter-inciser distance > 4 cm  Prognanth Able    Mallampati: II  Neck ROM: full  TM distance: 4-5 cm  Short thick neck: Yes        Cardiovascular:    Rhythm: regular   Rate: normal         Pulmonary:  - pulmonary exam normal            Neuro/Neck/Skeletal/Skin:      Dental:        Abdominal:      General: morbid obesity     Additional Clinical Notes:               Last  OSA (STOP BANG) Score:  No Data Recorded    Last OSA  (STOP) Score for   No Data Recorded                 Past Medical History:   Diagnosis Date    Left-sided muscle weakness     Obesity     Stroke (CMS-HCC)     Uterine cancer (CMS-HCC)      Past Surgical History:   Procedure Laterality Date    HYSTERECTOMY       Social History   Substance Use Topics    Smoking status: Not on file    Smokeless tobacco: Not on file    Alcohol use Not on file       Current Facility-Administered  Medications   Medication Dose Route Frequency Provider Last Rate Last Dose    acetaminophen (TYLENOL) tablet 650 mg  650 mg Oral Q4H PRN Zayets, Lowella Dell, MD        Or    acetaminophen (TYLENOL) solution 650 mg  650 mg NG Tube Q4H PRN Zayets, Lowella Dell, MD        Or    acetaminophen (TYLENOL) suppository 650 mg  650 mg Rectal Q4H PRN Zayets, Lowella Dell, MD        albuterol (PROVENTIL) 5 MG/ML nebulizer solution 2.5 mg  2.5 mg Nebulization Q6H PRN Zayets, Lowella Dell, MD        ciprofloxacin (CIPRO) tablet 500 mg  500 mg Oral Q12H Margaretha Seeds, MD   500 mg at 07/26/17 8119    gabapentin (NEURONTIN) capsule 100 mg  100 mg Oral BID Rexanne Mano V, MD   100 mg at 07/26/17 0835    latanoprost (XALATAN) 0.005 % ophthalmic solution 1 drop  1 drop Both Eyes HS Zayets, Stanislav V, MD   1 drop at 07/24/17 2221    LORazepam (ATIVAN) solution 0.5 mg  0.5 mg Oral Once PRN Tigges, Averie Lynn        metoclopramide (REGLAN) injection 10 mg  10 mg IntraVENOUS Q6H PRN Zayets, Lowella Dell, MD        metroNIDAZOLE (FLAGYL) IVPB 500 mg  500 mg IntraVENOUS Q8H Tigges, Averie Lynn 100 mL/hr at 07/26/17 0600 500 mg at 07/26/17 0600    nalOXone (NARCAN) injection 0.1 mg  0.1 mg IntraVENOUS Q2 Min PRN Zayets, Lowella Dell, MD        pantoprazole (PROTONIX) injection 40 mg  40 mg  IntraVENOUS Daily Zayets, Stanislav V, MD   40 mg at 07/26/17 0835    sodium chloride (PF) 0.9 % flush 3 mL  3 mL IntraVENOUS Q8H Zayets, Stanislav V, MD   Stopped at 07/25/17 0600    sodium chloride (PF) 0.9 % flush 3 mL  3 mL IntraVENOUS PRN Zayets, Lowella Dell, MD        sodium chloride 0.9 % TKO infusion   IntraVENOUS Continuous PRN Maurine Minister, MD 10 mL/hr at 07/24/17 1714 10 mL/hr at 07/24/17 1714     Allergies   Allergen Reactions    Pcn [Penicillins] Anaphylaxis    Strawberry Rash    Sulfa Drugs Rash    Aspirin-Dipyridamole Unspecified     pruritus    Nitrous Oxide Other     Gets paranoid from, given at dentist before    Trazodone Other     Nightmares    Zolpidem Unspecified     Doesn't help sleep and gets anxious       Labs and Other Data  Lab Results   Component Value Date    NA 138 07/25/2017    K 4.2 07/25/2017    CL 99 07/25/2017    BICARB 31 (H) 07/25/2017    BUN 8 07/25/2017    CREAT 0.73 07/25/2017    GLU 93 07/25/2017    [place] 8.9 07/25/2017     Lab Results   Component Value Date    AST 213 (H) 07/25/2017    ALT 232 (H) 07/25/2017    ALK 263 (H) 07/25/2017    TP 7.3 07/25/2017    ALB 3.0 (L) 07/25/2017    TBILI 2.50 (H) 07/25/2017     Lab Results   Component Value Date    WBC 4.8 07/25/2017  RBC 4.00 07/25/2017    HGB 12.5 07/25/2017    HCT 39.0 07/25/2017    MCV 97.5 (H) 07/25/2017    MCHC 32.1 07/25/2017    RDW 14.3 (H) 07/25/2017    PLT 218 07/25/2017    MPV 11.5 07/25/2017    SEG 60 07/25/2017    LYMPHS 30 07/25/2017    MONOS 9 07/25/2017    EOS 1 07/25/2017     Lab Results   Component Value Date    INR 1.2 07/25/2017    PTT 33 06/02/2017     No results found for: ARTPH, ARTPO2, ARTPCO2    Anesthesia Plan:  Risks and Benefits of Anesthesia  I personally examined the patient immediately prior to the anesthetic and reviewed the pertinent medical history, drug and allergy history, laboratory and imaging studies and consultations. I have determined that the patient has had adequate  assessment and testing.    Anesthetic techniques, invasive monitors, anesthetic drugs for induction, maintenance and post-operative analgesia, risks and alternatives have been explained to the patient and/or patient's representatives.    I have prescribed the anesthetic plan:         Planned anesthesia method: General         ASA 3 (Severe systemic disease)     Potential anesthesia problems identified and risks including but not limited to the following were discussed with patient and/or patient's representative: Adverse or allergic drug reaction, Dental injury or sore throat, Nerve injury, Injury to brain, heart and other organs and Death    No Beta Blocker Indicated: Patient not on beta blockersPlanned monitoring method: Routine monitoring  Comments: (Discussed geta.  Patient agrees to lift DNR status for OR; is jehovah's witness, will accept saline/IVF only)    Informed Consent:  Anesthetic plan and risks discussed with Patient.  Use of blood products discussed with whom did not consent to blood products.         Plan discussed with CRNA and Attending.

## 2017-07-26 NOTE — Anesthesia Postprocedure Evaluation (Signed)
Anesthesia Transfer of Care Note    Patient: Emily Ball    Procedures performed: Procedure(s):  GI ERCP    Vital signs: stable           Anesthesia Post Note    Patient: Emily Ball    Procedure(s) Performed: Procedure(s):  GI ERCP      Final anesthesia type: General    Patient location: PACU    Post anesthesia pain: adequate analgesia    Mental status: awake, alert  and oriented    Airway Patent: Yes    Last Vitals:   Vitals:    07/26/17 1222   BP: 117/51   Pulse: 68   Resp: 18   Temp: 36.7 C   SpO2: 98%       Post vital signs: stable    Hydration: adequate    N/V:no    Anesthetic complications: no    Plan of care per primary team.

## 2017-07-26 NOTE — H&P (Signed)
History and Physical    Indication for procedure:  choledocholithiasis    Pain Score: 0  Pain Location: Abdomen  Pain Description: Burning    Past Medical History:   Diagnosis Date    Left-sided muscle weakness     Obesity     Stroke (CMS-HCC)     Uterine cancer (CMS-HCC)      Past Surgical History:   Procedure Laterality Date    HYSTERECTOMY       Allergies   Allergen Reactions    Pcn [Penicillins] Anaphylaxis    Strawberry Rash    Sulfa Drugs Rash    Aspirin-Dipyridamole Unspecified     pruritus    Nitrous Oxide Other     Gets paranoid from, given at dentist before    Trazodone Other     Nightmares    Zolpidem Unspecified     Doesn't help sleep and gets anxious     Prior to Admission Medications   Prescriptions Last Dose Informant Patient Reported? Taking?   omeprazole (PRILOSEC OTC) 20 MG Enteric-Coated tablet   No No   Sig: Take 20 mg by mouth daily.      Facility-Administered Medications: None       BP 125/71 (BP Location: Right arm, BP Patient Position: Semi-Fowlers)   Pulse 61   Temp 98 F (36.7 C)   Resp 12   Ht 5\' 9"  (1.753 m)   Wt 133.7 kg (294 lb 12.1 oz)   SpO2 100%   BMI 43.53 kg/m2  General: Well developed, well nourished, in no apparent distress.  Lungs: Clear breath sounds bilaterally.  CV: Normal rate, regular rhythm, no significant murmur present.  Abdomen: Soft, nontender, normal bowel sounds present.    ASA Score:  3   Airway (Mallimpati) Score:  Class II - Soft palate, uvula, and fauces are visible.    Assessment and Plan  Proceed to planned procedure.    The patient has consented to the procedure, which will be done with sedation.  I have assessed the patient's status immediately prior to this procedure.  I have discussed pain management needs and options for the patient with the patient or caregiver.      The patient agrees to be full code for the duration of the procedure.    Sedation options, risks, and plans have been discussed with the patient or caregiver.  Questions were  answered.  The patient or caregiver agrees to proceed as planned.    Deeann Cree

## 2017-07-26 NOTE — Procedures (Signed)
[facility]/Special Procedures     Patient Name: Emily Ball   Date of Birth: 01-May-1951   Record Number: [id number]   Date of Procedure: 07/26/2017   Referring Physician:   Endoscopist: Nicola Police  Asst.  Endoscopist: None     PROCEDURE PERFORMED  ERCP - stone removal, biliary dilation, biliary stent placement,   pancreatic stent placement, X ray interpretation of bile duct and   pancreatic imaging,  EUS esophagus, stomach, and duodenum, EGD    INDICATIONS FOR EXAMINATION   63F with morbid obesity, CAD, CVA with hemiplegia, Jehovah's Witness,   who is admitted to [facility] with abdominal pain,   elevated LFTs, and CT, MRCP consistent with cholecystitis and 52mm   stone in the distal bile duct. Surgery says she is not a surgical   candidate and recommends ERCP.     Instruments:  [phone]  Medications: General Anesthesia, Ciprolfoxacin, 100mg  rectal   indomethicin, 1L LR  The attending physician, Dr. Rozelle Logan, was present for the entire   examination.   Procedure Technique: A physical exam was performed. Informed consent   was obtained from the patient after explaining all the risks   (pancreatitis, cholangitis, perforation, bleeding, infection, missed   lesion(s), tooth damage, and adverse effects to the medicine) ,   benefits and alternatives to the procedure which the patient appeared   to understand and so stated.  The patient was connected to the   monitoring devices. Continuous oxygen was provided with a nasal   cannula and IV medicine administered thru a indwelling cannula. After   adequate conscious sedation was achieved, the patient was intubated   and the scope advanced under direct visualization to the extent of   exam. The scope was subsequently removed slowly while carefully   examining the color, texture, anatomy, and integrity of the mucosa on   the way out. The patient was subsequently transferred to the recovery   area in satisfactory condition.   Extent of Exam: second part of duodenum.   Biopsy Obtained: No.   Complications: None.  Estimated Blood Loss: none.  Need for Future   Anesthesia: yes.     FINDINGS  EGD: Normal esophagus. Large amount of solid food in the gastric body   and antrum limiting gastric exam. Moderate amount of food in the   duodenum.  EUS: Radial scope. Normal EUS of the esophagus, stomach, and   duodenum. No pancreas head mass. CBD measures up to 49mm. There is a   15mm hyperechoic stone with shadowing in the distal bile duct. No   ascites or enlarged lymph nodes. Gallbladder distended with stones.  ERCP: TJF scope. Scope positioning was initially difficult to reduce   and ampullary approach was difficult as there was a moderate food in   the duodenum which limited views and approach to the ampulla. Initial   wire passage into the pancreatic duct as confirmed on fluoroscopy. No   pancreatic duct contrast injection. Biliary cannulation still   difficult over the PD wire so a 4Fr x 5cm pancreatic stent was placed   to reduce risk of pancreatitis and help facilitate biliary   cannulation. Biliary cannulation then obtained over PD wire. No   sphincterotomy done given Jehovah's Witness status and patient needs   general anesthesia for each procedure, is normall DNR/DNI, and does   not wish to have any procedures if possible so tried to minimize   bleeding risk which would require repeat  endoscopy. Contrast   cholangiogram showed a 109mm distal CBD with a 4-61mm filling defect.   There are multiple stones within the cystic duct as well which is   accessible with the guidewire. CRE balloon dilation of the distal   bile duct and ampulla performed to 57mm under fluoroscopic guidance.   Balloon sweeps with 51mm and 92mm balloon removed stone debris and   small stones but difficult to assess how many as views were very   limited with food present. A 10Fr x 8cm plastic biliary stent was   placed to help protect the patient from future stones passing  causing   symptoms as her approach is largely palliative at this point per her   wishes.   Dr. Nicola Police supervised and interpreted radiologic bile duct and   pancreatic duct imaging.     ENDOSCOPIC DIAGNOSIS  1) Choledocholithiasis - EUS/ERCP performed with stone removal,   biliary and pancreatic stent placement  2) Patient has several stones in the cystic duct and gallbladder   which are also at risk for falling into the bile duct  3) Large amount of retained food in the stomach likely from delayed   gastric emptying - recommend longer period of liquid diet before   future elective endoscopy procedures     RECOMMENDATIONS  -clear liquid diet for 24 hours - if doing well tomorrow evening can   have full liquid diet  -continue antibiotics for 7-10 days given possible cholecystitis  -trend LFTs    Signature:_________________________________ Nicola Police, M.D. [phone])    Note:  The final official report is in the [facility]   medical record.     This procedure was electronically signed off on: 07/26/2017 11:35:00   AM by Nicola Police,  M.D.

## 2017-07-26 NOTE — Plan of Care (Signed)
Problem: Infection  Goal: Absence of infection signs and symptoms  Outcome: Progressing  Pt tolerating intermittent IV antibiotics, pt remains afebrile, VSS. Will continue to monitor for s/sx of infection.     Problem: Nausea/Vomiting  Goal: Absence of nausea  Outcome: Progressing  Pt denies n/v, will continue to monitor. Pt NPO for possible ERCP in am, pt aware and verbalized understanding.     Problem: Tissue Perfusion, Cardiopulmonary - Altered  Goal: Hemodynamic stability  Outcome: Progressing  Pt NSR on cardiac monitor, denies palpitations and SOB. VSS, current BP 133/75 (BP Location: Right arm, BP Patient Position: Semi-Fowlers)   Pulse 72   Temp 97.8 F (36.6 C)   Resp 16   Ht 5\' 9"  (1.753 m)   Wt 133.7 kg (294 lb 12.1 oz)   SpO2 98%   BMI 43.53 kg/m2. Will continue to monitor closely.     Problem: Skin Integrity- Intact patient high risk for impaired skin integrity Braden scale less than or equal to 18  Goal: Skin remains intact  Outcome: Progressing  Pt's skin remains intact, repositioned q2h and prn for comfort, pt refuses heels to be elevated off bed with pillow, much education provided and pt still refusing BLE to be elevated or heels floated. Incontinence care provided prn, purewick remains in place, skin remains intact. Pt does have discoloration in peri area/groin area and also on buttocks, pt stated it is from prior radiation treatment, barrier cream applied with incontinent cares and prn. Sacral mepilex placed for prevention.  Will continue to monitor skin closely.

## 2017-07-26 NOTE — Progress Notes (Signed)
Medicine Progress Note     ID: Emily Ball is a 66 year old female with PMH significant for metastatic uterine cancer s/p TAH/BSO 09/13/2007, completed radiation 12/2007, hypertension, peripheral neuropathy, morbid obesity, bed bound due to L hemiplegia and morbid obesity. Patient presents with abdominal pain and found to have acute cholangitis s/p ERCP with stone removal, pending evaluation by surgery if cholecystectomy while inpatient indicated.    INTERVAL:  ERCP - stone removal, biliary dilation, biliary stent placement,   pancreatic stent placement    SUBJECTIVE:   ERCP went well. Tolerating clear liquid diet. Denies any current abdominal pain, fevers or chills, nausea or vomiting.      OBJECTIVE:  Vital Signs: Temperature:  [97.7 F (36.5 C)-98.2 F (36.8 C)] 98.1 F (36.7 C) (10/14 1222)  Blood pressure (BP): (100-145)/(44-79) 117/51 (10/14 1222)  Heart Rate:  [61-80] 68 (10/14 1222)  Respirations:  [11-18] 18 (10/14 1222)  Pain Score: 0 (10/14 1222)  O2 Device: None (Room air) (10/14 1222)  O2 Flow Rate (L/min):  [10 l/min] 10 l/min (10/14 1112)  SpO2:  [97 %-100 %] 98 % (10/14 1222)    Intake and Output  10/13 0600 - 10/14 0559  In: 240 [P.O.:240]  Out: 900 [Urine:900]    Wt Readings from Last 3 Encounters:   07/24/17 133.7 kg (294 lb 12.1 oz)   06/02/17 113.4 kg (250 lb)       Physical Exam:  General: obese female, nad  Neck: No cervical LAD  Lungs: ctab  CV: RRR, nml S1/S2, no r/m/g  GI: soft, NT, ND, normal BS, negative murphys sign  Ext: warm and well perfused, peripheral pulses intact bilaterally, bilat le edema, left>R   Neuro: L sided hemiplegia     Meds:  Current Facility-Administered Medications   Medication    acetaminophen (TYLENOL) tablet 650 mg    Or    acetaminophen (TYLENOL) solution 650 mg    Or    acetaminophen (TYLENOL) suppository 650 mg    albuterol (PROVENTIL) 5 MG/ML nebulizer solution 2.5 mg    ciprofloxacin (CIPRO) tablet 500 mg    gabapentin (NEURONTIN) capsule 100 mg       latanoprost (XALATAN) 0.005 % ophthalmic solution 1 drop    LORazepam (ATIVAN) solution 0.5 mg    metoclopramide (REGLAN) injection 10 mg    metroNIDAZOLE (FLAGYL) IVPB 500 mg    nalOXone (NARCAN) injection 0.1 mg    pantoprazole (PROTONIX) injection 40 mg    sodium chloride (PF) 0.9 % flush 3 mL    sodium chloride (PF) 0.9 % flush 3 mL    sodium chloride 0.9 % TKO infusion     Facility-Administered Medications Ordered in Other Encounters   Medication    dexamethasone (DECADRON) injection    famotidine (PEPCID) injection    fentaNYL injection    lactated ringers infusion    phenylephrine dilution (NEO-SYNEPHRINE) injection    propofol (DIPRIVAN) injection (200 mg/20 mL)    rocuronium (ZEMURON) injection       Labs:  Recent Labs      07/24/17   0231  07/25/17   0636  07/26/17   1246   WBC  6.3  4.8  5.5   HGB  13.1  12.5  12.9   HCT  41.8  39.0  39.9   MCV  98.4*  97.5*  97.3*   PLT  194  218  212   SEG  66  60  81   LYMPHS  28  30  15   MONOS  _0 EOS  _1 Recent Labs      07/24/17   0429  07/24/17   1737  07/25/17   0636  07/26/17   1246   NA  140  143  138  140   K  2.9*  2.5*  4.2  4.1   CL  101  107  99  99   BICARB  29  26  31*  29   BUN  7*  5*  8  10   CREAT  0.45*  0.40*  0.73  0.68   [place]  8.9  6.8*  8.9  9.1   MG  1.9   --   2.0   --    PHOS  3.1   --   3.6   --        Recent Labs      07/24/17   0429  07/24/17   1737  07/25/17   0636  07/26/17   1246   TP  8.3*  6.3  7.3  8.0   ALB  3.2*  2.7*  3.0*  3.3*   TBILI  1.66*  1.79*  2.50*  2.75*   AST  379*  247*  213*  131*   ALT  209*  208*  232*  179*   ALK  226*  204*  263*  309*       Recent Labs      07/25/17   0637  07/26/17   1246   PT  13.7*  13.5*   INR  1.2  1.2       Microbiology:   None     Radiology   MRCP 07/24/17   IMPRESSION:  Findings suspicious for acute cholecystitis and cholangitis.    ERCP:   ENDOSCOPIC DIAGNOSIS  1) Choledocholithiasis - EUS/ERCP performed with stone removal,   biliary and pancreatic  stent placement  2) Patient has several stones in the cystic duct and gallbladder   which are also at risk for falling into the bile duct  3) Large amount of retained food in the stomach likely from delayed   gastric emptying - recommend longer period of liquid diet before   future elective endoscopy procedures        ASSESSMENT and PLAN:  Emily Ball is a 66 year old female with PMH significant for metastatic uterine cancer s/p TAH/BSO 09/13/2007, completed radiation 12/2007, hypertension, peripheral neuropathy, morbid obesity, bed bound due to L hemiplegia and morbid obesity. Patient presents with abdominal pain and found to have acute cholangitis s/p ERCP with stone removal, pending evaluation by surgery if cholecystectomy while inpatient indicated.    #Acute cholangitis s/p stone removal and biliary/pancreatic stent placement.   Patient presented with acute onset abdominal pain, f/c, with CT A/P and RUQ [place] with cholelithiasis and equivocal findings for cholecystitis. MRCP with CBD stones. S/p ERCP today with stone removal, biliary and pancreatitc stent placmenet.   - Cipro/flagyl (10/12-), requires 7-10 course  -c lear liquid diet for 24 hours  - GI consulted appreciate recs   - Appreciate recs from surgery if patient requires cholecystectomy. Per ERCP report, with several stones in cystic duct and gallbladder  - trend LFTs     Chronic conditions:   #Hx metastatic uterine cancer s/p TAH/BSO 09/13/2007, completed radiation 12/2007  #HTH  #H/o stroke: bed  bound due to L hemiplegia, late effect of stroke and morbid obesity  #Hypothyrodism  #iron deficiency anemia  #GERD  Continue outpatient medications: albuterol, gabapentin, latanoprost, hold aspirin  Unfortunately, patient does not remember her dose of synthroid, hydrocodone and name and dose of "anxiety" medication - requested records       #FEN: Diet Clear Liquid  #GI: PPI   #PPx: lovenox    #Dispo: Pending tolerating diet and if cholecystectomy  indicated      #Code Status: DNAR - [place], [facility]  [phone]  This patient was seen and discussed with my attending Dr. Arletha Pili    Attending Physician Attestation:    I have personally seen and examined the patient, reviewed the medical history and computerized patient record in detail and discussed the pertinent findings, assessments and treatment plans with the resident.  I agree with the findings and treatment plan as presented in the resident note.    Subjective    Had endoscopy with stone removal and biliary stent placement. Feels well afterwards.     Objective    Vital signs reviewed in EMR.        Labs reviewed in EMR.    Assessment / Plan    1. Cholangitis- s/p stone removal and stent placement. Given large amount of retained food in stomach will keep on liquids and advance slowly. Continue cipro/flagyl for 10-14 days   2. ? cholecystitits - no abdominal pain but will discuss with surgery as high risk for more stones per endoscopy.       Rhona Leavens, [facility]  07/26/17, 3:23 PM

## 2017-07-26 NOTE — Plan of Care (Signed)
Problem: Infection  Goal: Absence of infection signs and symptoms  Outcome: Progressing  Monitoring patient's labs.  Patient assessed for s/s of acute infection. Patient remains afrebile. Patient denies any fever or chills, and skin remains warm and dry. Labs monitored daily.  Results for Emily Ball, Emily Ball (MRN [id number]) as of 07/26/2017 11:38   Ref. Range 07/25/2017 06:36   WBC Latest Ref Range: 4.0 - 10.0 1000/mm3 4.8     Patient instructed to report any changes including presence of fever, chills or malaise. Will continue to assess and monitor.       Problem: Nausea/Vomiting  Goal: Absence of nausea  Outcome: Progressing  No episodes of vomiting or nausea.  Will provide medication per MD orders (see MAR).  Will continue to assess and monitor.     Problem: Tissue Perfusion, Cardiopulmonary - Altered  Goal: Hemodynamic stability  Outcome: Progressing  Patient does not complain of chest pain, SOB, palpitations, or dizziness. Patient's vital signs are as follows:    07/26/17  0450 07/26/17  0736 07/26/17  0836 07/26/17  1127   BP: 145/79  125/71 127/68   Pulse: 62 76 61 69   Resp: 15 16 12 16    Temp: 98.2 F (36.8 C)  98 F (36.7 C) 97.8 F (36.6 C)   SpO2: 100%  100% 97%     Patient is on room air.  Patient being monitored on telemetry: NSR.  Will continue to assess and monitor.         Problem: Skin Integrity- Intact patient high risk for impaired skin integrity Braden scale less than or equal to 18  Goal: Skin remains intact  Outcome: Progressing  Reinforced the importance of turning every 2 hours to prevent pressure ulcer(s). Patient is kept clean and dry.  Will continue to assess and monitor.

## 2017-07-26 NOTE — Addendum Note (Signed)
Addendum  created 07/26/17 1505 by Trecia Rogers, MD    Sign clinical note

## 2017-07-27 ENCOUNTER — Encounter (HOSPITAL_COMMUNITY): Payer: Self-pay | Admitting: Student in an Organized Health Care Education/Training Program

## 2017-07-27 ENCOUNTER — Other Ambulatory Visit: Payer: Self-pay | Admitting: Internal Medicine

## 2017-07-27 DIAGNOSIS — K8309 Other cholangitis: Secondary | ICD-10-CM

## 2017-07-27 LAB — COMPREHENSIVE METABOLIC PANEL, BLOOD
ALT (SGPT): 153 U/L — ABNORMAL HIGH (ref 0–33)
AST (SGOT): 98 U/L — ABNORMAL HIGH (ref 0–32)
Albumin: 3.4 g/dL — ABNORMAL LOW (ref 3.5–5.2)
Alkaline Phos: 302 U/L — ABNORMAL HIGH (ref 35–140)
Anion Gap: 12 mmol/L (ref 7–15)
BUN: 9 mg/dL (ref 8–23)
Bicarbonate: 28 mmol/L (ref 22–29)
Bilirubin, Tot: 1.39 mg/dL — ABNORMAL HIGH (ref <1.2)
Calcium: 9.4 mg/dL (ref 8.5–10.6)
Chloride: 100 mmol/L (ref 98–107)
Creatinine: 0.58 mg/dL (ref 0.51–0.95)
GFR: >60 mL/min
Glucose: 102 mg/dL — ABNORMAL HIGH (ref 70–99)
Potassium: 3.6 mmol/L (ref 3.5–5.1)
Sodium: 140 mmol/L (ref 136–145)
Total Protein: 8.5 g/dL — ABNORMAL HIGH (ref 6.0–8.0)

## 2017-07-27 LAB — CBC WITH DIFF, BLOOD
ANC-Automated: 3.1 10*3/uL (ref 1.6–7.0)
Abs Lymphs: 1.6 10*3/uL (ref 0.8–3.1)
Abs Monos: 0.3 10*3/uL (ref 0.2–0.8)
Hct: 40.8 % (ref 34.0–45.0)
Hgb: 13.2 gm/dL (ref 11.2–15.7)
Lymphocytes: 31 %
MCH: 31.3 pg (ref 26.0–32.0)
MCHC: 32.4 g/dL (ref 32.0–36.0)
MCV: 96.7 um3 — ABNORMAL HIGH (ref 79.0–95.0)
MPV: 11.7 fL (ref 9.4–12.4)
Monocytes: 7 %
Plt Count: 245 10*3/uL (ref 140–370)
RBC: 4.22 10*6/uL (ref 3.90–5.20)
RDW: 14.6 % — ABNORMAL HIGH (ref 12.0–14.0)
Segs: 62 %
WBC: 5 10*3/uL (ref 4.0–10.0)

## 2017-07-27 LAB — MAGNESIUM, BLOOD: Magnesium: 2.3 mg/dL (ref 1.6–2.4)

## 2017-07-27 LAB — PHOSPHORUS, BLOOD: Phosphorous: 3.1 mg/dL (ref 2.7–4.5)

## 2017-07-27 MED ORDER — INFLUENZA VAC SPLIT HIGH-DOSE 0.5 ML IM SUSY
0.5000 mL | PREFILLED_SYRINGE | INTRAMUSCULAR | Status: DC
Start: 2017-07-27 — End: 2017-07-28

## 2017-07-27 MED ORDER — LANSOPRAZOLE 30 MG OR CPDR
30.0000 mg | DELAYED_RELEASE_CAPSULE | Freq: Two times a day (BID) | ORAL | Status: DC
Start: 2017-07-27 — End: 2017-07-28
  Administered 2017-07-27 – 2017-07-28 (×2): 30 mg via ORAL
  Filled 2017-07-27 (×2): qty 1

## 2017-07-27 MED ORDER — CIPROFLOXACIN HCL 500 MG OR TABS
500.0000 mg | ORAL_TABLET | Freq: Two times a day (BID) | ORAL | 0 refills | Status: AC
Start: 2017-07-27 — End: 2017-08-03

## 2017-07-27 MED ORDER — LORAZEPAM 0.5 MG OR TABS
0.5000 mg | ORAL_TABLET | Freq: Once | ORAL | Status: AC
Start: 2017-07-27 — End: 2017-07-27
  Administered 2017-07-27: 0.5 mg via ORAL
  Filled 2017-07-27: qty 1

## 2017-07-27 MED ORDER — GABAPENTIN 100 MG OR CAPS
100.00 mg | ORAL_CAPSULE | Freq: Two times a day (BID) | ORAL | Status: DC
Start: 2014-09-04 — End: 2017-10-27

## 2017-07-27 MED ORDER — LANSOPRAZOLE 30 MG OR CPDR
30.0000 mg | DELAYED_RELEASE_CAPSULE | Freq: Every day | ORAL | Status: DC
Start: 2017-07-28 — End: 2017-07-27

## 2017-07-27 MED ORDER — METRONIDAZOLE 500 MG OR TABS
500.0000 mg | ORAL_TABLET | Freq: Three times a day (TID) | ORAL | 0 refills | Status: AC
Start: 2017-07-27 — End: 2017-08-03

## 2017-07-27 MED ORDER — ALBUTEROL SULFATE 108 (90 BASE) MCG/ACT IN AERS
1.00 | INHALATION_SPRAY | RESPIRATORY_TRACT | Status: DC | PRN
Start: 2016-09-19 — End: 2017-07-27

## 2017-07-27 MED ORDER — LEVOTHYROXINE SODIUM 25 MCG OR TABS
125.0000 ug | ORAL_TABLET | Freq: Every day | ORAL | Status: DC
Start: 2017-07-27 — End: 2017-07-28
  Administered 2017-07-27: 125 ug via ORAL
  Filled 2017-07-27 (×2): qty 1

## 2017-07-27 MED ORDER — METRONIDAZOLE 500 MG OR TABS
500.0000 mg | ORAL_TABLET | Freq: Three times a day (TID) | ORAL | Status: DC
Start: 2017-07-27 — End: 2017-07-28
  Administered 2017-07-27 – 2017-07-28 (×4): 500 mg via ORAL
  Filled 2017-07-27 (×5): qty 1

## 2017-07-27 MED ORDER — ASPIRIN 81 MG OR CHEW: 81.00 mg | CHEWABLE_TABLET | Freq: Every day | ORAL | Status: AC

## 2017-07-27 MED ORDER — INFLUENZA VAC SPLIT QUAD 0.5 ML IM SUSY
0.5000 mL | PREFILLED_SYRINGE | INTRAMUSCULAR | Status: DC
Start: 2017-07-27 — End: 2017-07-27

## 2017-07-27 MED ORDER — NITROGLYCERIN 0.4 MG SL SUBL
0.50 mg | SUBLINGUAL_TABLET | SUBLINGUAL | Status: AC | PRN
Start: 2016-06-30 — End: 2018-06-30

## 2017-07-27 MED ORDER — LORAZEPAM 0.5 MG OR TABS
0.50 mg | ORAL_TABLET | Freq: Two times a day (BID) | ORAL | Status: DC | PRN
Start: 2013-09-21 — End: 2018-03-29

## 2017-07-27 MED ORDER — LACTULOSE 10 GM/15ML OR SOLN
30.00 mL | Freq: Two times a day (BID) | ORAL | Status: AC | PRN
Start: 2016-07-31 — End: 2017-07-31

## 2017-07-27 MED ORDER — FLUTICASONE PROPIONATE 50 MCG/ACT NA SUSP
2.00 | Freq: Every day | NASAL | Status: AC
Start: 2013-08-09 — End: 2017-08-08

## 2017-07-27 MED ORDER — HYDROCORTISONE 1 % EX OINT
1.00 | TOPICAL_OINTMENT | Freq: Four times a day (QID) | CUTANEOUS | Status: AC
Start: 2015-03-04 — End: 2019-03-03

## 2017-07-27 MED ORDER — LANSOPRAZOLE 30 MG OR CPDR
30.00 mg | DELAYED_RELEASE_CAPSULE | ORAL | 0 refills | Status: DC
Start: 2017-07-27 — End: 2018-04-22

## 2017-07-27 MED ORDER — METRONIDAZOLE 500 MG OR TABS
ORAL_TABLET | ORAL | 0 refills | Status: DC
Start: 2017-07-27 — End: 2018-04-22

## 2017-07-27 MED ORDER — NYSTOP 100000 UNIT/GM EX POWD
1.00 | Freq: Two times a day (BID) | CUTANEOUS | Status: DC
Start: 2016-07-11 — End: 2018-04-22

## 2017-07-27 MED ORDER — PNEUMOCOCCAL VAC POLYVALENT 25 MCG/0.5ML IJ INJ (CUSTOM)
0.5000 mL | INJECTION | INTRAMUSCULAR | Status: DC
Start: 2017-07-27 — End: 2017-07-28

## 2017-07-27 MED ORDER — LATANOPROST 0.005 % OP SOLN
1.00 [drp] | Freq: Every day | OPHTHALMIC | Status: AC
Start: 2013-08-09 — End: 2017-08-08

## 2017-07-27 MED ORDER — VITAMIN B-12 1000 MCG OR TABS
1000.00 ug | ORAL_TABLET | Freq: Every day | ORAL | Status: DC
Start: 2013-08-09 — End: 2018-04-30

## 2017-07-27 MED ORDER — MAGNESIUM CITRATE 1.745 GM/30ML OR SOLN
1.00 | Freq: Every day | ORAL | Status: AC | PRN
Start: 2016-08-11 — End: ?

## 2017-07-27 MED ORDER — LANSOPRAZOLE 30 MG OR CPDR
30.0000 mg | DELAYED_RELEASE_CAPSULE | Freq: Two times a day (BID) | ORAL | 0 refills | Status: DC
Start: 2017-07-27 — End: 2017-10-27

## 2017-07-27 MED ORDER — LEVOTHYROXINE SODIUM 125 MCG OR TABS
ORAL_TABLET | ORAL | 0 refills | Status: DC
Start: 2017-07-27 — End: 2018-03-29

## 2017-07-27 MED ORDER — LANSOPRAZOLE 30 MG OR CPDR
30.0000 mg | DELAYED_RELEASE_CAPSULE | Freq: Two times a day (BID) | ORAL | Status: DC
Start: 2017-07-27 — End: 2017-07-27

## 2017-07-27 MED ORDER — LEVOTHYROXINE SODIUM 125 MCG OR TABS
125.0000 ug | ORAL_TABLET | Freq: Every day | ORAL | 0 refills | Status: DC
Start: 2017-07-28 — End: 2018-03-29

## 2017-07-27 MED ORDER — CIPROFLOXACIN HCL 500 MG OR TABS
500.00 mg | ORAL_TABLET | ORAL | 0 refills | Status: DC
Start: 2017-07-27 — End: 2018-04-22

## 2017-07-27 NOTE — Plan of Care (Signed)
Problem: Infection  Goal: Absence of infection signs and symptoms  Outcome: Progressing  Pt afebrile overnight, Continue IV and PO antibiotics, VS remain stable, pending AM labs.

## 2017-07-27 NOTE — Progress Notes (Signed)
Surgery Daily Progress Note:   07/27/17     Current Hospital Stay:   3 days - Admitted on: 07/24/2017    Emily Ball is a 66 year old presenting with abdominal pain that was found with hyperbilirubinemia in the setting of normal WBCs. Although imaging potentially concerning for cholecystitis, patient's exam and labs not especially consistent with this diagnosis. MRI showing stone at ampulla and dilated CBD. 1 Day Post-Op    24hr/Subjective:  NAEON  Afebrile and HD stable  Had ERCP yesterday with stone removal, tolerated procedure well  On CLD this am, feels hungry and wants regular diet  Denies abdominal pain, nausea, emesis    Objective:    Vital Signs:  Temperature:  [97.3 F (36.3 C)-98.9 F (37.2 C)]   Heart Rate:  [55-74]   Blood pressure (BP): (100-190)/(44-92)   Respirations:  [11-22]   SpO2:  [97 %-100 %]   O2 Device: None (Room air)    Intake/Output (Current Shift):  In: 1700 [P.O.:930; I.V.:806]  Out: 1800 [Urine:1800]    Lines:   .    Active PICC Line / CVC Line / PIV Line / Drain / Airway / Intraosseous Line / Epidural Line / ART Line / Line Type / Wound / Nasogastric/Orogastric tube / Urethral Catheter / Pressure Ulcer Injury     Name: Placement date: Placement time: Site: Days:    Peripheral IV - 20 G Left Neck 07/25/17   1714   Neck   1    Peripheral IV - 20 G Right Shoulder 07/25/17   1725   Shoulder   1    External Device 07/26/17   2248      less than 1                Meds:  ciprofloxacin, 500 mg, Q12H    enoxaparin, 60 mg, Daily    gabapentin, 100 mg, BID    hydrALAZINE, 10 mg, Once    latanoprost, 1 drop, HS    levothyroxine, 125 mcg, QAM AC    metroNIDAZOLE, 500 mg, Q8H    pantoprazole, 40 mg, Daily    sodium chloride (PF), 3 mL, Q8H    Physical Exam:   Gen: NAD, resting comfortably in bed  CV: RRR  Lungs: normal work of breathing on room air, saturates well on RA  Abd: obese abdomen, soft, non tender to palpation, non-distended  Ext: WWP, no peripheral edema    Laboratory data:     WBC/HGB/HCT/PLT:  5.0/13.2/40.8/245 (10/15 1749)   BMP:  140/4.1/99/--/10/0.68/104 (10/14 1246)     Liver Panel:  131/179/2.75/--/309/8.0/3.3 (10/14 1246)  INR/PTT:  1.2/-- (10/14 1246)  No results found for: ARTPH, ARTPO2, ARTPCO2  No results found for: PH, PO2, PCO2      Imaging:  MRI abdomen 10/12:  FINDINGS:  LUNG BASES: Subsegmental atelectasis.  LIVER: Unremarkable  BILIARY: Cholelithiasis, sludge, submucosal edema of the gallbladder. Fundal adenomyomatosis. Apparent small stones in the cystic duct as well as within the common bile duct causing mild obstruction. Significant peribiliary perfusion abnormalities with bile duct enhancement suggesting cholangitis. 8 mm dilation of the common bile duct. 4 mm stone at the ampulla (series 14, image 45).  PANCREAS: Unremarkable  SPLEEN: Unremarkable  ADRENALS: Unremarkable  KIDNEYS: Bilateral foci of T2 hyperintensity, too small to characterize.  VASCULATURE: Unremarkable  LYMPHATIC: Unremarkable  SMALL BOWEL: Visualized portions are unremarkable.  LARGE BOWEL: Visualized portions are unremarkable.  MESENTERY / OMENTUM: Unremarkable  PERITONEAL CAVITY: Unremarkable  ABDOMINAL WALL:  Unremarkable  BONES: Unremarkable    CONCURRENT SUPERVISION:  I have reviewed the images and agree with the resident/fellow interpretation.        Preliminary created by: Emily Ball   Signed by: Emily Ball 07/24/2017 17:21:16      Impression     IMPRESSION:  Findings suspicious for acute cholecystitis and cholangitis.       Assessment and Plan:  This is a 66 year old presenting with abdominal pain that was found with hyperbilirubinemia in the setting of normal WBCs. Although imaging potentially concerning for cholecystitis, patient's exam and labs not especially consistent with this diagnosis. MRI showing stone at ampulla and dilated CBD. Underwent ERCP with stone removal. LFTs downtrending, WBC normal. Overall stable this am.     - Patient is not a good operative  candidate and we recommend non operative management at the this time. We discussed that if she develops recurrent symptoms we may consider surgical intervention at that time.   - OK for regular diet  - Dispo per primary team  - On discharge please include on d/c instructions Dr. Orest Dikes clinic contact information:   Dr. Lenor Coffin, clinic phone number: [phone]  - Please advise patient to contact us with any questions/ceoncerns    Eleftherios Makris, MD/PhD  Surgical resident    ATTENDING: Pt seen and examined, and we had a long discussion about pro's/con's of choly.  Currently, she is asymptomatic.  She definitely does still have stone burden and could have recurrent symptoms, but hopefully sphincterotomy will protect here from future episodes of choledocholithiasis. LFTs improved but not normalized.  It would be reasonable to discharge her as long as she has outpatient follow-up with plan to check LFTs. Given her operative risk, I would not recommend choly unless she has recurrent symptoms or problems related to GB.

## 2017-07-27 NOTE — Progress Notes (Addendum)
I have evaluated the patient today; she will be discharged from the hospital today.     Today, her physical examination is notable for abdomen soft, non-tender, non-distended. Spoke to surgery team and no cholecystectomy plans at this time. Will follow up with GI. Educated patient on advancing diet slowly with avoidance of high fat foods initially.     Please refer to the Discharge Summary for further details.    I spent  25 minutes on the patient's care unit in the preparation and execution of the hospital discharge.    Feliberto Stockley Singh Jeanluc Wegman    UPDATE: CM not able to get authorization for transport, patient will have to go tmw.

## 2017-07-27 NOTE — Discharge Instructions (Addendum)
Diagnosis and Reason for Admission    You were admitted to the hospital for the following reason(s):  Cholangitis    Your full diagnosis list is located on this After Visit Summary in the Hospital Problems section.    What [facility] Stay    The main tests and treatments done for you during this hospitalization were:    1) ERCP with stone removal and biliary and pancreatic stent placement.  2) Beginning antibiotics (ciprofloxacin, metronidazole) for a 10 day course from 07/24/2017 to 08/02/2017.    The following evaluation is still important to complete after discharge from the hospital:  You will be schedule for another ERCP in 8 weeks with the gastroenterology doctors    If you keep developing abdominal pain or other similar symptoms to what you came into the hospital with today, please follow up with general surgeon Dr. Lenor Coffin, clinic phone number: [phone] to discuss having a surgery to remove your gallbladder    Instructions for After Discharge    As we discussed, your first meal at home should be a low-fat diet. You can introduce fatty foods (such as bacon) the next day. If you are having abdominal discomfort, nausea, or vomiting, hold off on eating fatty foods. If your discomfort and pain continue and worsen please call your PCP or come into the ED.    Your activity level at home should be:  as much exercise or activity as you can tolerate.    Specific activity restrictions:    None    Wound or tube care instructions:  None    Your medication list is located on this After Visit Summary in the Current Discharge Medication List section.  Your nurse will review this information with you before you leave the hospital.    It is very important for you to keep a current medication list with you in order to assist your doctors with your medical care.  Bring this After Visit Summary with you to your follow up appointments.         Reasons to Contact a Doctor Urgently    Call 911 or return  to the hospital immediately if:  You have worsening abdominal pain, nausea, vomiting that does not improve by not eating fatty foods.    You should contact either your primary care physician or your hospital physician for any of the following reasons:  Abdominal pain, nausea, vomiting.    Your hospital physician at the time of hospital discharge is Dr. Rhona Leavens.    Your physicians strive to explain things in a way you can understand.  If you have any questions or concerns about your hospital care or your medications, your hospital physician can be contacted in the following manner:  [facility] phone triage at [phone].    New problems and symptoms unrelated to your hospitalization are best addressed by your primary outpatient physician (PCP), as are requests for medication refills or appointment referrals after hospital discharge.  However, if new issues arise before you can see or contact your PCP, your hospital physician may be able to assist with these issues during this time.    What Needs to Happen Next After Discharge -- Appointments and Follow Up    Any appointments already scheduled at [facility] clinics will be listed in the Future Appointments section at the top of this After Visit Summary.      Sometimes tests performed in the hospital do not yet have results by the  time a patient goes home.  The following key tests will need to be followed up at your next appointment: None    Medical Home Information    Your primary care provider or clinic currently on file at [facility] is: Emily Ball, Emily Ball  You currently have an advance directive or living will on file at [facility]: Yes    Handouts Given to You (if applicable)    Fat and Cholesterol Restricted Diet  High levels of fat and cholesterol in your blood may lead to various health problems, such as diseases of the heart, blood vessels, gallbladder, liver, and pancreas. Fats are concentrated sources of energy that come in various forms. Certain types  of fat, including saturated fat, may be harmful in excess. Cholesterol is a substance needed by your body in small amounts. Your body makes all the cholesterol it needs. Excess cholesterol comes from the food you eat.  When you have high levels of cholesterol and saturated fat in your blood, health problems can develop because the excess fat and cholesterol will gather along the walls of your blood vessels, causing them to narrow. Choosing the right foods will help you control your intake of fat and cholesterol. This will help keep the levels of these substances in your blood within normal limits and reduce your risk of disease.  What is my plan?  Your health care provider recommends that you:   Limit your fat intake to 25% or less of your total calories per day.   Limit the amount of cholesterol in your diet to less than 200 mg per day.   Eat 20-30 grams of fiber each day.    What types of fat should I choose?   Choose healthy fats more often. Choose monounsaturated and polyunsaturated fats, such as olive and canola oil, flaxseeds, walnuts, almonds, and seeds.   Eat more omega-3 fats. Good choices include salmon, mackerel, sardines, tuna, flaxseed oil, and ground flaxseeds. Aim to eat fish at least two times a week.   Limit saturated fats. Saturated fats are primarily found in animal products, such as meats, butter, and cream. Plant sources of saturated fats include palm oil, palm kernel oil, and coconut oil.   Avoid foods with partially hydrogenated oils in them. These contain trans fats. Examples of foods that contain trans fats are stick margarine, some tub margarines, cookies, crackers, and other baked goods.  What general guidelines do I need to follow?  These guidelines for healthy eating will help you control your intake of fat and cholesterol:   Check food labels carefully to identify foods with trans fats or high amounts of saturated fat.   Fill one half of your plate with vegetables and green  salads.   Fill one fourth of your plate with whole grains. Look for the word "whole" as the first word in the ingredient list.   Fill one fourth of your plate with lean protein foods.   Limit fruit to two servings a day. Choose fruit instead of juice.   Eat more foods that contain fiber, such as apples, broccoli, carrots, beans, peas, and barley.   Eat more home-cooked food and less restaurant, buffet, and fast food.   Limit or avoid alcohol.   Limit foods high in starch and sugar.   Limit fried foods.   Cook foods using methods other than frying. Baking, boiling, grilling, and broiling are all great options.   Lose weight if you are overweight. Losing just 5-10% of your initial body weight  can help your overall health and prevent diseases such as diabetes and heart disease.    What foods can I eat?  Grains   Whole grains, such as whole wheat or whole grain breads, crackers, cereals, and pasta. Unsweetened oatmeal, bulgur, barley, quinoa, or brown rice. Corn or whole wheat flour tortillas.  Vegetables   Fresh or frozen vegetables (raw, steamed, roasted, or grilled). Green salads.  Fruits   All fresh, canned (in natural juice), or frozen fruits.  Meats and other protein foods   Ground beef (85% or leaner), grass-fed beef, or beef trimmed of fat. Skinless chicken or [place]. Ground chicken or [place]. Pork trimmed of fat. All fish and seafood. Eggs. Dried beans, peas, or lentils. Unsalted nuts or seeds. Unsalted canned or dry beans.  Dairy   Low-fat dairy products, such as skim or 1% milk, 2% or reduced-fat cheeses, low-fat ricotta or cottage cheese, or plain low-fat yo  Fats and oils   Tub margarines without trans fats. Light or reduced-fat mayonnaise and salad dressings. Avocado. Olive, canola, sesame, or safflower oils. Natural peanut or almond butter (choose ones without added sugar and oil).  The items listed above may not be a complete list of recommended foods or beverages. Contact your dietitian  for more options.  Foods to avoid  Grains   White bread. White pasta. White rice. Cornbread. Bagels, pastries, and croissants. Crackers that contain trans fat.  Vegetables   White potatoes. Corn. Creamed or fried vegetables. Vegetables in a cheese sauce.  Fruits   Dried fruits. Canned fruit in light or heavy syrup. Fruit juice.  Meats and other protein foods   Fatty cuts of meat. Ribs, chicken wings, bacon, sausage, bologna, salami, chitterlings, fatback, hot dogs, bratwurst, and packaged luncheon meats. Liver and organ meats.  Dairy   Whole or 2% milk, cream, half-and-half, and cream cheese. Whole milk cheeses. Whole-fat or sweetened yogurt. Full-fat cheeses. Nondairy creamers and whipped toppings. Processed cheese, cheese spreads, or cheese curds.  Beverages   Alcohol. Sweetened drinks (such as sodas, lemonade, and fruit drinks or punches).  Fats and oils   Butter, stick margarine, lard, shortening, ghee, or bacon fat. Coconut, palm kernel, or palm oils.  Sweets and desserts   Corn syrup, sugars, honey, and molasses. Candy. Jam and jelly. Syrup. Sweetened cereals. Cookies, pies, cakes, donuts, muffins, and ice cream.  The items listed above may not be a complete list of foods and beverages to avoid. Contact your dietitian for more information.  This information is not intended to replace advice given to you by your health care provider. Make sure you discuss any questions you have with your health care provider.  Document Released: 09/29/2005 Document Revised: 10/20/2014 Document Reviewed: 12/28/2013  Elsevier Interactive Patient Education  2018 [employer].      Cholangitis  Cholangitis is inflammation of the group of tubes (ducts) that carry digestive juices from the liver, gallbladder, and pancreas to the small intestine. This group of ducts is called the biliary tract. Cholangitis can cause fever, abdominal pain, and yellowish discoloration of the skin, the whites of the eyes, and mucous membranes  (jaundice).  Cholangitis can get worse very quickly and cause infection throughout the body (sepsis). It is important to diagnose and treat cholangitis as soon as possible.  What are the causes?  This condition is usually caused by a blockage (obstruction) in the biliary tract. The most common causes of obstruction are:   Formation of hard particles (stones) in the biliary tract.  Damage to the biliary tract from a previous surgical or diagnostic procedure.    Other causes of an obstruction include:   Cysts or tumors in the biliary tract.   A type of liver disease that affects the biliary tract (primary sclerosing cholangitis).   Being born with a narrow biliary tract.    When the flow of digestive juices is blocked, bacteria that normally live in the intestine can grow and spread inside the biliary tract.  What increases the risk?  The following factors may make you more likely to develop this condition:   Being 73?68 years old.   Having a history of stones in the biliary tract.   Having had cholangitis in the past.   Having HIV.   Having another condition that affects the biliary tract.   Having had a procedure to diagnose or treat problems with the biliary tract, especially endoscopic retrograde cholangiopancreatography (ERCP). These types of procedures may cause scarring and obstruction that can lead to infection.    What are the signs or symptoms?  The most common symptoms of this condition are fever, abdominal pain, and jaundice. Most of the time, all of these symptoms are present. Other symptoms may include:   Chills.   Tiredness.   Nausea.   Dark-colored urine.   Clay-colored stools.   Confusion.   Itchy skin.    How is this diagnosed?  This condition may be diagnosed based on:   Your symptoms.   A physical exam.   Your medical history. Your health care provider may ask whether you have had stones, ERCP, or other procedures involving the biliary tract in the past.   Blood  tests.   Imaging studies, such as:  ? An ultrasound. This uses sound waves to make an image of any obstructions that you have.  ? An MRI.  ? A CT scan.   ERCP to check the biliary tract for possible causes of cholangitis. During ERCP, a thin, lighted tube (endoscope) is passed through your mouth and down your throat into the first part of your small intestine (duodenum). A small, plastic tube (cannula) is then passed through the endoscope and directed into your bile duct or pancreatic duct. Dye is then injected through the cannula and X-rays are taken.    How is this treated?    This condition is usually treated at a hospital. Treatment may include:   Receiving fluids, nutrition, and antibiotic medicines through an IV tube. You may be given antibiotics that kill most of the bacteria known to cause cholangitis (broad spectrumantibiotics).   ERCP or another surgical procedure to open and drain the biliary tract.    Follow these instructions at home:  Medicines   Take over-the-counter and prescription medicines only as told by your health care provider.   Take your antibiotic medicine as told by your health care provider. Do not stop taking the antibiotic even if you start to feel better.  General instructions   Return to your normal activities as told by your health care provider. Ask your health care provider what activities are safe for you.   Follow instructions from your health care provider about eating or drinking restrictions.   Maintain a healthy weight.   Exercise regularly, as told by your health care provider.   Keep all follow-up visits as told by your health care provider. This is important.  Contact a health care provider if:   You have a fever.   Your symptoms return or become  more severe.   You suddenly lose weight.  This information is not intended to replace advice given to you by your health care provider. Make sure you discuss any questions you have with your health care  provider.  Document Released: 10/26/2015 Document Revised: 03/06/2016 Document Reviewed: 10/18/2014  Elsevier Interactive Patient Education  2018 [employer].      Endoscopic Retrograde Cholangiopancreatogram  Endoscopic retrograde cholangiopancreatogram (ERCP) is a procedure that may be used to diagnose or treat problems with the pancreas, bile ducts, liver, and gallbladder. For this procedure, a thin, lighted tube (endoscope) is passed through the mouth, the throat, and down into the areas being checked. The endoscope has a camera that allows the areas to be viewed. Dye is injected and then X-rays are taken to further study the areas.  During ERCP, other procedures may also be done to help diagnose or treat problems that are found. For example, stones can be removed, or a tissue sample can be taken out for testing (biopsy).  Tell a health care provider about:   Any allergies you have.   All medicines you are taking, including vitamins, herbs, eye drops, creams, and over-the-counter medicines.   Any problems you or family members have had with anesthetic medicines.   Any blood disorders you have.   Any surgeries you have had.   Any medical conditions you have.   Whether you are pregnant or may be pregnant.  What are the risks?  Generally, this is a safe procedure. However, problems may occur, including:   Pancreatitis.   Infection.   Bleeding.   Allergic reactions to medicines or dyes.   Accidental punctures in the bowel wall, pancreas, or gallbladder.   Damage to other structures or organs.    What happens before the procedure?  Staying hydrated  Follow instructions from your health care provider about hydration, which may include:   Up to 2 hours before the procedure - you may continue to drink clear liquids, such as water, clear fruit juice, black coffee, and plain tea.    Eating and drinking restrictions  Follow instructions from your health care provider about eating and drinking, which may  include:   8 hours before the procedure - stop eating heavy meals or foods such as meat, fried foods, or fatty foods.   6 hours before the procedure - stop eating light meals or foods, such as toast or cereal.   6 hours before the procedure - stop drinking milk or drinks that contain milk.   2 hours before the procedure - stop drinking clear liquids.     General instructions   Ask your health care provider about:  ? Changing or stopping your regular medicines. This is especially important if you are taking diabetes medicines or blood thinners.  ? Taking medicines such as aspirin and ibuprofen. These medicines can thin your blood. Do not take these medicines before your procedure if your health care provider instructs you not to.   Plan to have someone take you home from the hospital or clinic.   If you will be going home right after the procedure, plan to have someone with you for 24 hours.  What happens during the procedure?   To lower your risk of infection, your health care team will wash or sanitize their hands.   An IV tube will be inserted into one of your veins.   You will be given one or more of the following:  ? A medicine to help  you relax (sedative).  ? A medicine to numb the throat area (local anesthetic) and prevent gagging. Your throat may be sprayed with this medicine, or you may gargle the medicine.  ? A medicine to make you fall asleep (general anesthetic).  ? A medicine to lower your risk of infection (antibiotic), inflammation (anti-inflammatory), or both.   You will lie on your left side.   The endoscope will be inserted through your mouth, down the back of the throat, and into the first part of the small intestine (duodenum).   Then a small, plastic tube (cannula) will be passed through the endoscope and directed into the bile duct or pancreatic duct.   Dye will be injected through the cannula to make structures easier to see on an X-ray.   X-rays will be taken to study the  biliary and pancreatic passageways. You may be positioned on your abdomen or your back during the X-rays.   A small sample of tissue (biopsy) may be removed for examination, or other procedures may be done to fix problems that are found.  The procedure may vary among health care providers and hospitals.  What happens after the procedure?   Your blood pressure, heart rate, breathing rate, and blood oxygen level will be monitored until the medicines you were given have worn off.   Your throat may feel slightly sore.   You will not be allowed to eat or drink until numbness subsides.   Do not drive for 24 hours if you were given a sedative.  Summary   Endoscopic retrograde cholangiopancreatogram is a procedure that may be used to diagnose or treat problems with the pancreas, bile ducts, liver, and gallbladder.   During ERCP, other procedures may also be done to help diagnose or treat problems that are found. For example, stones can be removed, or a tissue sample can be taken out for testing (biopsy).   Generally, this is a safe procedure. However, problems may occur, including infection, bleeding, pancreatitis, accidental damage to other structures or organs, and allergic reactions to medicines or dyes.   The procedure may vary among health care providers and hospitals.  This information is not intended to replace advice given to you by your health care provider. Make sure you discuss any questions you have with your health care provider.  Document Released: 06/24/2001 Document Revised: 08/25/2016 Document Reviewed: 08/25/2016  Elsevier Interactive Patient Education  2017 [employer].

## 2017-07-27 NOTE — Plan of Care (Signed)
Problem: Nausea/Vomiting  Goal: Absence of nausea  Outcome: Progressing  Pt tolerating clear liquid diet overnight, denies N/V and abdominal pain. Plan to advance diet slowly.    Problem: Tissue Perfusion, Cardiopulmonary - Altered  Goal: Hemodynamic stability  Outcome: Progressing  Pt NSR/SB on telemetry monitor. Denies chest pain/ palpitations/SOB. Latest VS BP (!) 158/92   Pulse 55   Temp 98.9 F (37.2 C)   Resp 18   Ht 5\' 9"  (1.753 m)   Wt 133.7 kg (294 lb 12.1 oz)   SpO2 100%   BMI 43.53 kg/m2. Resting comfortably in bed overnight, pending AM labs. Will continue to monitor closely.     Problem: Skin Integrity- Intact patient high risk for impaired skin integrity Braden scale less than or equal to 18  Goal: Skin remains intact  Outcome: Progressing  Pt skin remains intact, mepilex on sacrum for protection, hover mat used for turning and repositioning and cleaning. Purewick in place, skin remains clean and dry. Pt refusing repositioning overnight, discussed importance of frequent repositioning in order to prevent skin breakdown, pt verbalized understanding continues to refuse to turning, will continue to monitor closely and encourage to participate with POC.    Problem: Breathing Pattern - Ineffective  Goal: Respiratory rate, rhythm and depth return to baseline  Outcome: Progressing  Pt noted with some DOE when cleaning in bed, relieved by rest, tolerating RA overnight.

## 2017-07-27 NOTE — Interdisciplinary (Signed)
SBAR given to [facility] [facility] from [facility] to assume care. Write made self available for questions, all questions answered. PT does not appear to be in distress at time of transfer, all belonging sent with patient.   BP 141/60 (BP Location: Right arm, BP Patient Position: Semi-Fowlers)   Pulse 66   Temp 97.3 F (36.3 C)   Resp 17   Ht 5\' 9"  (1.753 m)   Wt 133.7 kg (294 lb 12.1 oz)   SpO2 99%   BMI 43.53 kg/m2

## 2017-07-27 NOTE — Interdisciplinary (Addendum)
DC order received.  Pt will need bariatric gurney home.  CM contacted Gay Filler / [facility].  Message left on VM with request for transportation auth.  Referral sent to [facility].      CM made follow-up call at 1536, spoke with Gay Filler.  Per [facility] Filler, an order for bariatric gurney with justification is needed prior to [place].  CM informed Gay Filler that Pt already has a DC order.  Per [facility] Filler, they will process as soon as they can.  CM paged first call with request for order, faxed to [facility].  CM standing by for call-back.  (CM informed first call auth may not be obtained today).  Charge nurse also notified.    1646: Follow-up call made to [facility], unable to reach [facility], message left on VM.  CM will follow-up tomorrow morning.

## 2017-07-27 NOTE — Plan of Care (Signed)
Problem: Nausea/Vomiting  Goal: Absence of nausea  Outcome: Progressing  Diet increased, patient tolerating and denies nausea/vomitting.     Problem: Tissue Perfusion, Cardiopulmonary - Altered  Goal: Hemodynamic stability  Outcome: Progressing  VSS, refused cardiac monitoring and MD aware    Problem: Breathing Pattern - Ineffective  Goal: Respiratory rate, rhythm and depth return to baseline  Outcome: Progressing  Patient respiration rate and rhythm intact and she denies SOB

## 2017-07-27 NOTE — Progress Notes (Signed)
Medicine Progress Note     ID: Emily Ball is a 66 year old female with PMH significant for metastatic uterine cancer s/p TAH/BSO 09/13/2007, completed radiation 12/2007, hypertension, peripheral neuropathy, morbid obesity, bed bound due to L hemiplegia and morbid obesity. Patient presents with abdominal pain and found to have acute cholangitis s/p ERCP with stone removal, pending evaluation by surgery if cholecystectomy while inpatient indicated.    INTERVAL:  Hypertensive last night, systolics to [id number]; patient refused hydralazine 10 mg IV. Anxious, received lorazepam 0.5 mg PO once.    SUBJECTIVE:   Tearful this morning because of "how [I] treated everyone last night". Tolerated diet: would like to advance diet, doesn't like tea or soup. Denies any current abdominal pain, fevers or chills, nausea or vomiting.       OBJECTIVE:  Vital Signs: Temperature:  [97.3 F (36.3 C)-98.9 F (37.2 C)] 97.3 F (36.3 C) (10/15 0503)  Blood pressure (BP): (100-190)/(44-92) 150/62 (10/15 0730)  Heart Rate:  [55-74] 56 (10/15 0503)  Respirations:  [11-22] 20 (10/15 0730)  Pain Score: 0 (10/15 0730)  O2 Device: None (Room air) (10/15 0730)  O2 Flow Rate (L/min):  [10 l/min] 10 l/min (10/14 1112)  SpO2:  [97 %-100 %] 100 % (10/15 0730)    Intake and Output    Intake/Output Summary (Last 24 hours) at 07/27/17 0938  Last data filed at 07/27/17 0559   Gross per 24 hour   Intake             1736 ml   Output             1800 ml   Net              -64 ml         Wt Readings from Last 3 Encounters:   07/24/17 133.7 kg (294 lb 12.1 oz)   06/02/17 113.4 kg (250 lb)       Physical Exam:  General: obese female, tearful  Neck: No cervical LAD  Lungs: CTAB, no w/r/r  CV: RRR, nml S1/S2, no r/m/g, 2+ peripheral edema  GI: soft, NT, ND, normal BS  Ext: warm and well perfused, peripheral pulses intact bilaterally  Neuro: L sided hemiplegia     Meds:  Current Facility-Administered Medications   Medication    acetaminophen (TYLENOL)  tablet 650 mg    Or    acetaminophen (TYLENOL) solution 650 mg    Or    acetaminophen (TYLENOL) suppository 650 mg    albuterol (PROVENTIL) 5 MG/ML nebulizer solution 2.5 mg    ciprofloxacin (CIPRO) tablet 500 mg    enoxaparin (LOVENOX) injection 60 mg    gabapentin (NEURONTIN) capsule 100 mg    hydrALAZINE (APRESOLINE) injection 10 mg    latanoprost (XALATAN) 0.005 % ophthalmic solution 1 drop    levothyroxine (SYNTHROID) tablet 125 mcg    LORazepam (ATIVAN) solution 0.5 mg    metoclopramide (REGLAN) injection 10 mg    metroNIDAZOLE (FLAGYL) IVPB 500 mg    nalOXone (NARCAN) injection 0.1 mg    pantoprazole (PROTONIX) injection 40 mg    sodium chloride (PF) 0.9 % flush 3 mL    sodium chloride (PF) 0.9 % flush 3 mL    sodium chloride 0.9 % TKO infusion       Labs:  Recent Labs      07/25/17   0636  07/26/17   1246  07/27/17   0642   WBC  4.8  5.5  5.0  HGB  12.5  12.9  13.2   HCT  39.0  39.9  40.8   MCV  97.5*  97.3*  96.7*   PLT  218  212  245   SEG  60  81  62   LYMPHS  '30  15  31   '$ MONOS  '9  3  7   '$ EOS  1  1   --         Recent Labs      07/24/17   1737  07/25/17   0636  07/26/17   1246  07/27/17   0642   NA  143  138  140  140   K  2.5*  4.2  4.1  3.6   CL  107  99  99  100   BICARB  26  31*  29  28   BUN  5*  '8  10  9   '$ CREAT  0.40*  0.73  0.68  0.58   [place]  6.8*  8.9  9.1  9.4   MG   --   2.0   --   2.3   PHOS   --   3.6   --   3.1       Recent Labs      07/24/17   1737  07/25/17   0636  07/26/17   1246  07/27/17   0642   TP  6.3  7.3  8.0  8.5*   ALB  2.7*  3.0*  3.3*  3.4*   TBILI  1.79*  2.50*  2.75*  1.39*   AST  247*  213*  131*  98*   ALT  208*  232*  179*  153*   ALK  204*  263*  309*  302*       Recent Labs      07/25/17   0637  07/26/17   1246   PT  13.7*  13.5*   INR  1.2  1.2       Microbiology:   None     Radiology   MRCP 07/24/17   IMPRESSION:  Findings suspicious for acute cholecystitis and cholangitis.    ERCP:   ENDOSCOPIC DIAGNOSIS  1) Choledocholithiasis - EUS/ERCP performed  with stone removal,   biliary and pancreatic stent placement  2) Patient has several stones in the cystic duct and gallbladder   which are also at risk for falling into the bile duct  3) Large amount of retained food in the stomach likely from delayed   gastric emptying - recommend longer period of liquid diet before   future elective endoscopy procedures        ASSESSMENT and PLAN:  Emily Ball is a 66 year old female with PMH significant for metastatic uterine cancer s/p TAH/BSO 09/13/2007, completed radiation 12/2007, hypertension, peripheral neuropathy, morbid obesity, bed bound due to L hemiplegia and morbid obesity. Patient presents with abdominal pain and found to have acute cholangitis s/p ERCP with stone removal, non-surgical management.    #Acute cholangitis s/p stone removal and biliary/pancreatic stent placement.   Cholecystitis. MRCP with CBD stones. S/p ERCP today with stone removal, biliary and pancreatitc stent placement. LFTs downtrending. Per surgery, non-operative management recommended, and may reconsider surgical intervention if symptoms recur.       - Cipro 500 mg PO q12H for 10 days, last dose 08/02/2017       - Metronidazole 500 mg PO q8H for 10 days,  last dose 08/02/2017       - Full liquid diet, advance as tolerated       - GI consulted appreciate recs        - Non-operative management per surgery            - f/u with Dr. Lenor Coffin    #Hypothyrodism  - levothyroxine 125 micrograms PO daily    #GERD: lansoprazole 30 mg PO BID    Chronic conditions:   #Hx metastatic uterine cancer s/p TAH/BSO 09/13/2007, completed radiation 12/2007  #HTH  #H/o stroke: bed bound due to L hemiplegia, late effect of stroke and morbid obesity  #iron deficiency anemia  #glaucoma: continue home latanoprost        #FEN: Full liquid diet, advance as tolerated  #GI: lansoprazole 30 mg PO BID  #PPx: enoxaparin 60 mg IV daily    #Dispo: discharge today      #Code Status: DNAR - Full Care    Jerald Kief, MD,  PhD  Internal Medicine, PGY-1    This patient was seen and discussed with my attending Dr. Arletha Pili

## 2017-07-27 NOTE — Progress Notes (Signed)
Gastroenterology Daily Progress Note:    Current Hospital Stay:   3 days - Admitted on: 07/24/2017    Consult Attending: Dr. Radene Knee  07/27/17        Overnight Events/Subjective:   -Ate two pancakes for breakfast, she is in good spirits, no abdominal pain    Current Facility-Administered Medications   Medication    acetaminophen (TYLENOL) tablet 650 mg    Or    acetaminophen (TYLENOL) solution 650 mg    Or    acetaminophen (TYLENOL) suppository 650 mg    albuterol (PROVENTIL) 5 MG/ML nebulizer solution 2.5 mg    ciprofloxacin (CIPRO) tablet 500 mg    enoxaparin (LOVENOX) injection 60 mg    gabapentin (NEURONTIN) capsule 100 mg    hydrALAZINE (APRESOLINE) injection 10 mg    lansoprazole (PREVACID) DR capsule 30 mg    latanoprost (XALATAN) 0.005 % ophthalmic solution 1 drop    levothyroxine (SYNTHROID) tablet 125 mcg    LORazepam (ATIVAN) solution 0.5 mg    metoclopramide (REGLAN) injection 10 mg    metroNIDAZOLE (FLAGYL) tablet 500 mg    nalOXone (NARCAN) injection 0.1 mg    sodium chloride (PF) 0.9 % flush 3 mL    sodium chloride (PF) 0.9 % flush 3 mL    sodium chloride 0.9 % TKO infusion       Allergies   Allergen Reactions    Pcn [Penicillins] Anaphylaxis    Strawberry Rash    Sulfa Drugs Rash    Aspirin-Dipyridamole Unspecified     pruritus    Nitrous Oxide Other     Gets paranoid from, given at dentist before    Trazodone Other     Nightmares    Zolpidem Unspecified     Doesn't help sleep and gets anxious       Objective:  Vital Signs:  Temperature:  [97.3 F (36.3 C)-98.9 F (37.2 C)] 97.3 F (36.3 C) (10/15 0503)  Blood pressure (BP): (144-190)/(62-92) 150/62 (10/15 0730)  Heart Rate:  [55-72] 56 (10/15 0503)  Respirations:  [14-22] 20 (10/15 0730)  Pain Score: 0 (10/15 0730)  O2 Device: None (Room air) (10/15 0730)  SpO2:  [98 %-100 %] 100 % (10/15 0730)  Wt Readings from Last 1 Encounters:   07/24/17 133.7 kg (294 lb 12.1 oz)       Intake/Output (Current Shift):  10/14 0600 - 10/15  0559  In: 1736 [P.O.:930; I.V.:806]  Out: 1800 [Urine:1800]    PHYSICAL EXAM  General:  Obese lady, not in acute distress, comfortable  HEENT: Scleral icterus not present, PERRLA, oropharynx clear  Cardiovascular:S1 S2, RRR   Pulm: clear to auscultation anteriorly  Gastrointestinal: +BS, soft, non-tender, nondistended.   Ext: Nil pedal edema   Neuro: A&O x3, no asterixis    Labs:  Recent Labs      07/25/17   0636  07/26/17   1246  07/27/17   0642   NA  138  140  140   K  4.2  4.1  3.6   CL  99  99  100   BICARB  31*  29  28   BUN  '8  10  9   '$ CREAT  0.73  0.68  0.58   [place]  8.9  9.1  9.4   MG  2.0   --   2.3   PHOS  3.6   --   3.1   TP  7.3  8.0  8.5*   ALB  3.0*  3.3*  3.4*   TBILI  2.50*  2.75*  1.39*   AST  213*  131*  98*   ALT  232*  179*  153*   ALK  263*  309*  302*       Recent Labs      07/25/17   0636  07/26/17   1246  07/27/17   0642   WBC  4.8  5.5  5.0   HGB  12.5  12.9  13.2   HCT  39.0  39.9  40.8   MCV  97.5*  97.3*  96.7*   PLT  218  212  245   SEG  60  81  62   LYMPHS  '30  15  31   '$ MONOS  '9  3  7   '$ EOS  1  1   --        Recent Labs      07/25/17   0637  07/26/17   1246   INR  1.2  1.2       Imaging:      EGD/EUS/ERCP 07/26/17     ENDOSCOPIC DIAGNOSIS  1) Choledocholithiasis - EUS/ERCP performed with stone removal,   biliary and pancreatic stent placement  2) Patient has several stones in the cystic duct and gallbladder   which are also at risk for falling into the bile duct  3) Large amount of retained food in the stomach likely from delayed   gastric emptying - recommend longer period of liquid diet before   future elective endoscopy procedures      Assessment and Plan:   Emily Ball is a 66 year old Jehovah's Witness F with history of stage IV metastatic uterine cancer s/p TAH/BSO in 2009, who is currently DNR/DNI code status, as well as multiple other co-morbidities including (prior CVA with L-sided hemiplegia, MI x3 with PCI and stent, morbid obesity, HTN) who presents with acute onset  abdominal pain, subjective fevers and chills, and CT Abdomen and RUQ US shows cholelithiasis with likely acute cholecystitis. She also has elevated LFTs, her findings are suspicious for biliary obstruction from a retained gallstone.  - EUS/ERCP 07/26/14 were notable for  74m distal CBD stone as well as multiple stones in cystic duct, the distal bile duct and ampulla were dilated, as well as ballon sweeps,which removed stone debris. A [id number] plastic biliary stent was placed as well for palliation of her symptoms.  - No fevers, or leukocytosis  -her T.bili and transaminases are downtrending ( t.bili 2.7-->1.3, ALT 179-->153, AST 131-->98)  -Tolerating regular diet and doing well    Recommendation  - continue antibiotics for cholecystitis for 7-10 days    Plan discussed with attending physician, Dr. KwRozelle Logannd Dr ChJoslyn HyGastroenterology Fellow  Pager: [phone]

## 2017-07-27 NOTE — Discharge Summary (Signed)
Date of Admission:  07/24/2017  Date of Discharge:  07/28/2017    Patient Name:  Emily Ball    Principal Diagnosis (required):  Acute cholangitis s/p stone removal and biliary/pancreatic stent placement    Hospital Problem List (required):  Active Hospital Problems    Diagnosis    Cholangitis [K83.09]    Vomiting [R11.10]      Resolved Hospital Problems    Diagnosis   No resolved problems to display.       Additional Hospital Diagnoses ("rule out" or "suspected" diagnoses, etc.):  Hypothyroidism  GERD  History metastatic uterine cancer s/p TAH/BSO 09/13/2007, completed radiation 12/2007  Hypertension  History of stroke with left hemiplegia  Glaucoma    Principal Procedure During This Hospitalization (required):  ERCP - stone removal, biliary dilation, biliary stent placement,   pancreatic stent placement, X ray interpretation of bile duct and   pancreatic imaging,  EUS esophagus, stomach, and duodenum, EGD     ENDOSCOPIC DIAGNOSIS  1) Choledocholithiasis - EUS/ERCP performed with stone removal,   biliary and pancreatic stent placement  2) Patient has several stones in the cystic duct and gallbladder   which are also at risk for falling into the bile duct  3) Large amount of retained food in the stomach likely from delayed   gastric emptying - recommend longer period of liquid diet before   future elective endoscopy procedures     RECOMMENDATIONS  -continue antibiotics for 7-10 days given possible cholecystitis    Other Procedures Performed During This Hospitalization (required):  CT Abdomen Pelvis  1. Cholelithiasis with possible areas of gallbladder wall thickening and pericholecystic fluid between the gallbladder and liver without definite pericholecystic fat stranding or extrahepatic bile duct dilation. Findings are equivocal for cholecystitis. Recommend ultrasound to further evaluate.    2. Bilateral lobulated kidneys with many subcentimeter hypoattenuating lesions, too small to characterize.    3.  Hepatomegaly, non-specific    MRCP   IMPRESSION:  Findings suspicious for acute cholecystitis and cholangitis.     Abdominal Ultrasound   IMPRESSION:  Multiple floating gallstone with findings suspicious for acute cholecystitis. If there is clinical concern, hepatobiliary scintigraphy can be performed to confirm the diagnosis.    No biliary duct dilation.       Consultations Obtained During This Hospitalization:  Gastroenterology  General Surgery     Reason for Admission to the Hospital / History of Present Illness:  A 59 year oldfemale with hx metastatic uterine cancer s/p TAH/BSO 09/13/2007, completed radiation 12/2007, hypertension, peripheral neuropathy, morbid obesity, bed bound due to L hemiplegia and morbid obesity. She presents with dull abdominal pain that feels like "bloating" sensation in right upper quadrant that started yesterday associated with nausea and vomiting.  She is a Jehovah Witness  She was previously seen in [facility] and [facility]    She was evaluated by GI and surgery in ER. She was started on IV cipro and IV flagyl.   [place] RUQ was done with dilated CBD to 3mm, and pancreatic head fullness    She allergic to PCN with anaphylaxis reaction in past    Hospital Course by Problem (required):  #Acute cholangitis s/p stone removal and biliary/pancreatic stent placement.   Initially presented with abdominal pain and sepsis, and found to have CBD stones on MRCP. ERCP showed choledocholithiasis, with EUS/ERCP performed with stone removal, biliary and pancreatic stent placement. Given she also had several stones in the cystic duct and gallbladder at high risk offalling into  the bile duct, general surgery was consulted for cholecystectomy. General surgery commented that given she is  not a good operative candidate, recommended non operative management unless she develops recurrent symptoms. She is to be treated for 10 day total course of cipro/flagyl. She is to follow up for repeat ERCP  with GI in 8 weeks.     #Hypothyrodism  - levothyroxine 125 micrograms PO daily    #GERD: lansoprazole 30 mg PO BID    #Hxmetastatic uterine cancer s/p TAH/BSO 09/13/2007, completed radiation 12/2007  #H/o stroke: bed bound due to L hemiplegia, late effect of strokeand morbid obesity  #Glaucoma: continue home latanoprost    Tests Outstanding at Discharge Requiring Follow Up:  None    Discharge Condition (required):  Stable.    Key Physical Exam Findings at Discharge:  Left hemiplegia, morbid obesity    Discharge Diet:  Low-fat / cardiac.    Discharge Medications:     What To Do With Your Medications      START taking these medications       Add'l Info    ciprofloxacin 500 MG tablet   Commonly known as:  CIPRO   Take 1 tablet (500 mg) by mouth every 12 hours for 7 days Indications: Infection with Dysregulated Inflammatory Response.    Quantity:  14 tablet   Refills:  0       lansoprazole 30 MG capsule   Commonly known as:  PREVACID   Take 1 capsule (30 mg) by mouth 2 times daily.    Quantity:  30 capsule   Refills:  0       levothyroxine 125 MCG tablet   Commonly known as:  SYNTHROID   Take 1 tablet (125 mcg) by mouth every morning (before breakfast).    Quantity:  30 tablet   Refills:  0       metroNIDAZOLE 500 MG tablet   Commonly known as:  FLAGYL   Take 1 tablet (500 mg) by mouth every 8 hours for 7 days Indications: Infection with Dysregulated Inflammatory Response.    Quantity:  21 tablet   Refills:  0         CONTINUE taking these medications       Add'l Info    aspirin 81 MG chewable tablet   Take 81 mg by mouth daily.    Refills:  0       [employer] CITRATE OF MAGNESIA 1.745 GM/30ML Soln   Take 1 bottle by mouth daily as needed for Constipation.   Generic drug:  magnesium citrate    Refills:  0       fluticasone propionate 50 MCG/ACT nasal spray   Commonly known as:  FLONASE   Spray 2 sprays into each nostril daily.    Refills:  0       gabapentin 100 MG capsule   Commonly known as:  NEURONTIN   Take 100 mg by  mouth 2 times daily.    Refills:  0       hydrocortisone 1 % ointment   Apply 1 Application topically 4 times daily.    Refills:  0       lactulose 10 GM/15ML solution   Take 30 mLs by mouth 2 times daily as needed for Constipation.    Refills:  0       latanoprost 0.005 % ophthalmic solution   Commonly known as:  XALATAN   Place 1 drop into both eyes daily.    Refills:  0       LORazepam 0.5 MG tablet   Commonly known as:  ATIVAN   Take 0.5 mg by mouth 2 times daily as needed.    Refills:  0       nitroGLYcerin 0.4 MG SL tablet   Commonly known as:  NITROSTAT   0.5 mg by Sublingual route every 5 minutes as needed.    Refills:  0       nystatin 100000 UNIT/GM powder   Apply 1 Application topically 2 times daily.    Refills:  0       vitamin B-12 1000 MCG tablet   Commonly known as:  CYANOCOBALAMIN   Take 1,000 mcg by mouth daily.    Refills:  0         STOP taking these medications          albuterol 108 (90 Base) MCG/ACT inhaler       omeprazole 20 MG Enteric-Coated tablet   Commonly known as:  PRILOSEC OTC            Where to Get Your Medications      These medications were sent to [facility]  [address], [place] [address]    Hours:  Mon-Fri 8:30am-7:00pm, Sat-Sun 9am-5:00pm Phone:  [phone]     ciprofloxacin 500 MG tablet    lansoprazole 30 MG capsule    levothyroxine 125 MCG tablet    metroNIDAZOLE 500 MG tablet             Allergies:  Allergies   Allergen Reactions    Pcn [Penicillins] Anaphylaxis    Strawberry Rash    Sulfa Drugs Rash    Aspirin-Dipyridamole Unspecified     pruritus    Nitrous Oxide Other     Gets paranoid from, given at dentist before    Trazodone Other     Nightmares    Zolpidem Unspecified     Doesn't help sleep and gets anxious       Discharge Disposition:  Home.    Discharge Code Status:  Do Not Attempt Resuscitation / full care  This code status is not changed from the time of admission.    Follow Up Appointments:  To  follow up with GI for repeat ERCP in 8 weeks  To follow up with Emily Ball (General Surg) if recurrent abdominal pain  Scheduled appointments:  No future appointments.    For appointments requested for after discharge that have not yet been scheduled, refer to the Post Discharge Referrals section of the After Visit Summary.    Discharging 11 Contact Information:  [facility] phone triage at [phone].

## 2017-07-27 NOTE — Interdisciplinary (Signed)
Pt anxious this morning due to neighbor making noise overnight. Pt with elevated BP this morning, but may be inaccurate due to pt moving arms and ripping off telemetry leads. Refusing BP recheck, telemetry and AM medication at this time.  Pt request to be left alone or she will leave AMA. 1ST call notified this morning. Will continue to encourage pt to participate with POC.

## 2017-07-27 NOTE — Interdisciplinary (Signed)
07/27/17 1304   Follow Up/Progress   Is the Patient Ready for Discharge No   Barriers to Discharge Awaiting clinical improvement     - s/p ERCP  - diet advanced to CLD today

## 2017-07-27 NOTE — Interdisciplinary (Signed)
Request AM IV antibiotics at a later time. Will endorse to AM RN.

## 2017-07-28 LAB — CBC WITH DIFF, BLOOD
ANC-Automated: 2.6 10*3/uL (ref 1.6–7.0)
Abs Eosinophils: 0.1 10*3/uL (ref 0.1–0.5)
Abs Lymphs: 1.9 10*3/uL (ref 0.8–3.1)
Abs Monos: 0.4 10*3/uL (ref 0.2–0.8)
Eosinophils: 2 %
Hct: 41.4 % (ref 34.0–45.0)
Hgb: 13.3 gm/dL (ref 11.2–15.7)
Imm Gran %: 1 % (ref <1)
Imm Gran Abs: 0.1 10*3/uL (ref <0.1)
Lymphocytes: 37 %
MCH: 31.4 pg (ref 26.0–32.0)
MCHC: 32.1 g/dL (ref 32.0–36.0)
MCV: 97.6 um3 — ABNORMAL HIGH (ref 79.0–95.0)
MPV: 11.4 fL (ref 9.4–12.4)
Monocytes: 9 %
Plt Count: 216 10*3/uL (ref 140–370)
RBC: 4.24 10*6/uL (ref 3.90–5.20)
RDW: 15 % — ABNORMAL HIGH (ref 12.0–14.0)
Segs: 51 %
WBC: 5.1 10*3/uL (ref 4.0–10.0)

## 2017-07-28 LAB — COMPREHENSIVE METABOLIC PANEL, BLOOD
ALT (SGPT): 166 U/L — ABNORMAL HIGH (ref 0–33)
AST (SGOT): 178 U/L — ABNORMAL HIGH (ref 0–32)
Albumin: 3.3 g/dL — ABNORMAL LOW (ref 3.5–5.2)
Alkaline Phos: 276 U/L — ABNORMAL HIGH (ref 35–140)
Anion Gap: 9 mmol/L (ref 7–15)
BUN: 12 mg/dL (ref 8–23)
Bicarbonate: 30 mmol/L — ABNORMAL HIGH (ref 22–29)
Bilirubin, Tot: 0.9 mg/dL (ref <1.2)
Calcium: 9.1 mg/dL (ref 8.5–10.6)
Chloride: 101 mmol/L (ref 98–107)
Creatinine: 0.66 mg/dL (ref 0.51–0.95)
GFR: >60 mL/min
Glucose: 86 mg/dL (ref 70–99)
Potassium: 3.3 mmol/L — ABNORMAL LOW (ref 3.5–5.1)
Sodium: 140 mmol/L (ref 136–145)
Total Protein: 8.1 g/dL — ABNORMAL HIGH (ref 6.0–8.0)

## 2017-07-28 LAB — PHOSPHORUS, BLOOD: Phosphorous: 3.2 mg/dL (ref 2.7–4.5)

## 2017-07-28 LAB — TSH, BLOOD: TSH: 19.45 u[IU]/mL — ABNORMAL HIGH (ref 0.27–4.20)

## 2017-07-28 LAB — MAGNESIUM, BLOOD: Magnesium: 2 mg/dL (ref 1.6–2.4)

## 2017-07-28 MED ORDER — LORAZEPAM 0.5 MG OR TABS
0.5000 mg | ORAL_TABLET | Freq: Once | ORAL | Status: AC
Start: 2017-07-28 — End: 2017-07-28
  Administered 2017-07-28: 0.5 mg via ORAL
  Filled 2017-07-28 (×2): qty 1

## 2017-07-28 MED ORDER — POTASSIUM CHLORIDE CRYS CR 10 MEQ OR TBCR
20.0000 meq | EXTENDED_RELEASE_TABLET | Freq: Once | ORAL | Status: AC
Start: 2017-07-28 — End: 2017-07-28
  Administered 2017-07-28: 20 meq via ORAL
  Filled 2017-07-28: qty 2

## 2017-07-28 NOTE — Interdisciplinary (Signed)
Patient is for discharge today with Home health arranged by [facility]  . Discharge instructions regarding diet , activity , medications and gastronterology follow up  . Pt verbalized understanding d/c instructions . IV access taken out with catheter visibly intact .Pick up by transport in stable condition .

## 2017-07-28 NOTE — Interdisciplinary (Addendum)
Patient is new to 6E.    Received hand-off form CM from other floor, that patient is ready for discharge but insurance CM did not call back with ambulance transport [place].    Follow up:- Left message with [facility] [phone], letting her know that patient stayed un-necssarily due to lack of transport auth.  Waiting for call back    Update:-  No call back from supervisor Vaughan Basta, called [facility] main number [phone], they were able to get in touch with Gay Filler [phone], who said I could use [facility]. Told her that they only provide wheelchair and not BLS or gurney. She insisted that [facility] provided BLS. She provided number for pt's [facility] [phone]. Called the number was wrong belongs to a private person who has received calls for them before and seemed frustrated.  Not able to get in touch with Gay Filler, no answer left message to call me. No call back.    Spoke with [place] at [facility], requesting assistance with this issue. She said she will arrange for wide or bariatric ambulance  and will document Sally's number in her note.   Patient and RN made aware.    ----Met with patient who is new to this CM, states, she has not walked since 2009. She lives alone and relies on her friends Natalia Leatherwood and Coralyn Mark to help her at home. She said she pays Angelita Ingles to help her with her hygiene needs and patient does what she can with her. She confirmed that she has hoyer lift, trapeze, hospital bed and bedside commode.   Patient confirmed that one of her friends will be at home to receive her. She will call them now.    Offered her home health services, she agreed. She also said that she knows she is deemed custodial and is not eligible for SNF.  Home health referral to contracted agencies initiated.

## 2017-07-28 NOTE — Interdisciplinary (Signed)
I left CM Gay Filler at [facility] a vm message [phone]) to obtain authorization for [facility] and for transportation and awaiting call back. [place]

## 2017-07-28 NOTE — Utilization Review (Signed)
==07/24/2017    HISTORY AND PHYSICAL    Admission Date:  07/24/2017    Attending MD:   Maurine Minister, MD    Primary Care Provider:  Michaelle Birks    Chief Complaint:  Abdominal pain    History of Present Illness:     A 63 year oldfemale with hx metastatic uterine cancer s/p TAH/BSO 09/13/2007, completed radiation 12/2007, hypertension, peripheral neuropathy, morbid obesity, bed bound due to L hemiplegia and morbid obesity. She presents with dull abdominal pain that feels like "bloating" sensation in right upper quadrant that started yesterday associated with nausea and vomiting.  She is a Jehovah Witness  She was previously seen in [facility] and [facility]    She was evaluated by GI and surgery in ER. She was started on IV cipro and IV flagyl.   [place] RUQ was done with dilated CBD to 90m, and pancreatic head fullness    She allergic to PCN with anaphylaxis reaction in past    Pain Assessment:  Pain is dull 8/10, radiates to back, no change with movement    Past Medical and Surgical History:   Past Medical History    No past medical history on file.      Past Surgical History    No past surgical history on file.     Immunization History:    There is no immunization history on file for this patient.    Allergies:       Allergies   Allergen Reactions    Pcn [Penicillins] Anaphylaxis     Medications:  Prior to Admission Medications:     Prescriptions Prior to Admission      (Not in a hospital admission)     Current Inpatient Medications:   aluminum-magnesium-simethicone  30 mL Once    lidocaine  10 mL Once    pantoprazole  40 mg Daily      ciprofloxacin (CIPRO) IVPB     metroNIDAZOLE     sodium chloride 0.9%-potassium chloride 20 mEq 75 mL/hr at 07/24/17 1142       Social History:  Social History           Social History    Marital status: Other     Spouse name: N/A    Number of children: N/A    Years of education: N/A          Social History Main Topics    Smoking  status: Not on file    Smokeless tobacco: Not on file    Alcohol use Not on file    Drug use: Not on file    Sexual activity: Not on file          Social Activities of Daily Living Present    Not on file     Social History Narrative     Family History:   Family History    No family history on file.     Review of Systems:  Constitutional: fatigue.  Eyes: negative.  Ears, Nose, Mouth, Throat: negative.  CV: negative.  Resp: negative.  GI: heartburn or reflux, abdominal pain, excessive gas or bloating.  GU: negative.  Musculoskeletal: negative.  Integumentary: negative.  Neuro: negative.  Psych: negative.  Heme/Lymphatic: negative.  All others negative    Physical Exam:  Temperature:  [97.7 F (36.5 C)-97.9 F (36.6 C)] 97.7 F (36.5 C) (10/12 0429)  Blood pressure (BP): (152-184)/(72-97) 156/97 (10/12 0600)  Heart Rate:  [60-76] 68 (10/12 1000)  Respirations:  [  16-22] 16 (10/12 1000)  Pain Score: Patient Sleeping, Respiratory Assessment Done (10/12 0429)  O2 Device: None (Room air) (10/12 0600)  SpO2:  [94 %-99 %] 99 % (10/12 1000)  General:  In mild distress, morbid obesiry  HEENT:  NC AT   Neck:  No JVD trachea midline  Lungs:  CTA b/l  CV: RRR s1s2, occasional PVCs are heard on exam  Abdomen:  Obese, tender to palpation right upper quadrant. BS + b/l  GU:  No cva tenderness  Extremities:  No c/c/e  Neuro:  Aoox3, motor and sensory nl, CN 2-12 intact    Labs and Other Data:        Lab Results   Component Value Date    NA 140 07/24/2017    K 2.9 (L) 07/24/2017    CL 101 07/24/2017    BICARB 29 07/24/2017    BUN 7 (L) 07/24/2017    CREAT 0.45 (L) 07/24/2017    GLU 156 (H) 07/24/2017    [place] 8.9 07/24/2017           Lab Results   Component Value Date    WBC 6.3 07/24/2017    HGB 13.1 07/24/2017    HCT 41.8 07/24/2017    PLT 194 07/24/2017           Lab Results   Component Value Date    AST 379 (H) 07/24/2017    ALT 209 (H) 07/24/2017    ALK 226 (H) 07/24/2017    TBILI 1.66 (H) 07/24/2017       TP 8.3 (H) 07/24/2017    ALB 3.2 (L) 07/24/2017     No results found for: INR, PTT    EKG with prolonged QTc and PVCs    CT scan of the abdomen and pelvis with IV contrast.    1. Cholelithiasis with possible areas of gallbladder wall thickening and pericholecystic fluid between the gallbladder and liver without definite pericholecystic fat stranding or extrahepatic bile duct dilation. Findings are equivocal for cholecystitis. Recommend ultrasound to further evaluate.    2. Bilateral lobulated kidneys with many subcentimeter hypoattenuating lesions, too small to characterize.    3. Hepatomegaly, non-specific    [facility] 11/2013 colonoscopy: pancolonic diverticulosis, with hx of bleeding, removed polyps, tubular adenoma on pathology report    Assessment and Care Plan:  A 66 yo female presents with abdominal pain and elevated LFTs    #Suspected acute cholecystitis with cholelithiasis  Cont Ciprofloxacin IV and Metronidazole IV but monitor closely on telemetry due to QTc prolongation and replete potassium and magnesium as needed  CT abd: dilated CBD to 76m, Cholelithiasis with possible areas of gallbladder wall thickening and pericholecystic fluid   Will repeat another CMP today due to elevated LFTs  Check MRI abdomen wo/w conrast (MRCP)  reglan prn nausea, avoid zofran due to QT prolongation  Check INR/CMP/CBC in am  Check old records (requested)  Appreciated GI input  Surgery evaluated the patient in ER and recommended no surgery at this time as per ER signout  Will keep patient on clears now and NPO PM for EUS and ERCP tomorrow am  Start IVF with potassium  if she becomes hemodynamically unstable, can consult IR for endoscopic drainage  Morphine IV prn pain  Vitals q4h    # Hypokalemia with PVCs: cont tele, replaced potassium, recheck potassium tonight, repeat EKG in am    #Jehovah witness status    #hx metastatic uterine cancer s/p TAH/BSO 09/13/2007, completed radiation 12/2007, hypertension, peripheral  neuropathy, morbid obesity, bed bound due to L hemiplegia, late effect of stroke and morbid obesity.  Hypothyrodism and iron deficiency anemia, GERD  Continue outpatient medications: albuterol, gabapentin, latanoprost, hold aspirin  Unfortunately, patient does not remember her dose of synthroid, hydrocodone and name and dose of "anxiety" medication    Code: DNAR/FUll care    DVT proph: will give one dose of heparin sq today, she will have a procedure tomorrow     This plan and alternatives have been discussed with the patient and/or surrogate.    Code Status:  No orders of the defined types were placed in this encounter.    ==07/25/2017    INTERVAL:  Peripheral access lost and patient refusing further attempts    SUBJECTIVE:   Patient is very upset about additional attempts for vascular access. Denies any current abdominal pain, fevers or chills, nausea or vomiting. Patient would like to wait for her home caregiver to come before attempting another peripheral line. Long discussion with patient about reasons why peripheral IV necessary including ERCP and IV antibiotics.    OBJECTIVE:  Vital Signs: Temperature:  [97.9 F (36.6 C)-98.6 F (37 C)] 98.2 F (36.8 C) (10/13 1116)  Blood pressure (BP): (93-160)/(67-109) 93/71 (10/13 1116)  Heart Rate:  [60-73] 62 (10/13 1116)  Respirations:  [14-27] 16 (10/13 1116)  Pain Score: 0 (10/13 1116)  O2 Device: None (Room air) (10/13 1116)  SpO2:  [93 %-100 %] 100 % (10/13 1116)    Physical Exam:  General: obese female, crying when talking about attempts for peripheral access  HEENT: non-icteric sclera, PEERL, MMM  Neck: No cervical LAD  Lungs: Distance breath sounds, no audible wheezes, crackles, or ronchi, no increased work of breathing  CV: RRR, nml S1/S2, no r/m/g  GI: soft, NT, ND, normal BS, negative murphys sign  GU: no suprapubic pain  Ext: warm and well perfused, peripheral pulses intact bilaterally, no edema  Neuro: CN II-IX grossly intact without focal  deficits, L sided hemiplegia     Labs:       Recent Labs     07/24/17   0231  07/25/17   0636   WBC  6.3  4.8   HGB  13.1  12.5   HCT  41.8  39.0   MCV  98.4*  97.5*   PLT  194  218   SEG  66  60   LYMPHS  28  30   MONOS  6  9   EOS  1  1              Recent Labs     07/24/17   0429  07/24/17   1737  07/25/17   0636   NA  140  143  138   K  2.9*  2.5*  4.2   CL  101  107  99   BICARB  29  26  31*   BUN  7*  5*  8   CREAT  0.45*  0.40*  0.73   [place]  8.9  6.8*  8.9   MG  1.9   --   2.0   PHOS  3.1   --   3.6           Recent Labs     07/24/17   0429  07/24/17   1737  07/25/17   0636   TP  8.3*  6.3  7.3   ALB  3.2*  2.7*  3.0*  TBILI  1.66*  1.79*  2.50*   AST  379*  247*  213*   ALT  209*  208*  232*   ALK  226*  204*  263*         Recent Labs     07/25/17   0637   PT  13.7*   INR  1.2     Radiology     RUQ [place] 07/24/17  IMPRESSION:  Multiple floating gallstone with findings suspicious for acute cholecystitis. If there is clinical concern, hepatobiliary scintigraphy can be performed to confirm the diagnosis.    No biliary duct dilation.    CT A/P 07/24/17  IMPRESSION:  CT scan of the abdomen and pelvis with IV contrast.    1. Cholelithiasis with possible areas of gallbladder wall thickening and pericholecystic fluid between the gallbladder and liver without definite pericholecystic fat stranding or extrahepatic bile duct dilation. Findings are equivocal for cholecystitis. Recommend ultrasound to further evaluate.    2. Bilateral lobulated kidneys with many subcentimeter hypoattenuating lesions, too small to characterize.    3. Hepatomegaly, non-specific    MRCP 07/24/17   IMPRESSION:  Findings suspicious for acute cholecystitis and cholangitis.    ASSESSMENT and PLAN:  Emily Ball is a 66 year old female with PMH significant formetastatic uterine cancer s/p TAH/BSO 09/13/2007, completed radiation 12/2007, hypertension, peripheral neuropathy, morbid obesity, bed bound due to L hemiplegia and morbid  obesity. Patient presents with abdominal pain, nausea and bilious vomiting and found to have acute cholecystitis.     #Acute cholecystitis with cholelithiasis: patient presented with acute onset abdominal pain, f/c, with CT A/P and RUQ [place] with cholelithiasis and equivocal findings for cholecystitis. MRCP with CBD stones. Elevated LFTs but without leukocytosis. Given ciprofloxacin and flagyl in ED. Has not been able to receive more antibiotics due to lack of access. Patient without abdominal pain today.   - PO antibiotics   - Continue IV antibiotics once IV access is achieved   - GI consulted appreciate recs                         - Plan to take for ERCP tomorrow due to anesthesia availability  - Surgery consulted, appreciate input                         - Rec for ERCP with GI, extremely high risk for surgery    #Hypokalemia: patient came in with potassium of 2.5 with PVC, replaced and responded to 4.2.   - Daily lytes    Chronic conditions:   #Hxmetastatic uterine cancer s/p TAH/BSO 09/13/2007, completed radiation 12/2007  #HTH  #H/o stroke: bed bound due to L hemiplegia, late effect of strokeand morbid obesity  #Hypothyrodism  #iron deficiency anemia  #GERD  Continue outpatient medications: albuterol, gabapentin, latanoprost, hold aspirin  Unfortunately, patient does not remember her dose of synthroid, hydrocodone and name and dose of "anxiety" medication - requested records     #FEN: Diet NPO Are oral medications allowed while the patient is NPO? No; Are enteral medications allowed while patient is NPO? No; Reason for NPO status: Patient NPO for procedure; Please specify procedure: ERCP in am  #GI: PPI   #PPx: holding SQH for GI procedure     #Dispo: Pending GI procedure     #Code Status: DNAR - Full Care    ==07/26/2017    INTERVAL:  ERCP - stone removal, biliary  dilation, biliary stent placement,   pancreatic stent placement    SUBJECTIVE:   ERCP went well. Tolerating clear liquid diet. Denies any  current abdominal pain, fevers or chills, nausea or vomiting.    OBJECTIVE:  Vital Signs: Temperature:  [97.7 F (36.5 C)-98.2 F (36.8 C)] 98.1 F (36.7 C) (10/14 1222)  Blood pressure (BP): (100-145)/(44-79) 117/51 (10/14 1222)  Heart Rate:  [61-80] 68 (10/14 1222)  Respirations:  [11-18] 18 (10/14 1222)  Pain Score: 0 (10/14 1222)  O2 Device: None (Room air) (10/14 1222)  O2 Flow Rate (L/min):  [10 l/min] 10 l/min (10/14 1112)  SpO2:  [97 %-100 %] 98 % (10/14 1222)    Physical Exam:  General: obese female, nad  Neck: No cervical LAD  Lungs: ctab  CV: RRR, nml S1/S2, no r/m/g  GI: soft, NT, ND, normal BS, negative murphys sign  Ext: warm and well perfused, peripheral pulses intact bilaterally, bilat le edema, left>R   Neuro: L sided hemiplegia     Labs:        Recent Labs     07/24/17   0231  07/25/17   0636  07/26/17   1246   WBC  6.3  4.8  5.5   HGB  13.1  12.5  12.9   HCT  41.8  39.0  39.9   MCV  98.4*  97.5*  97.3*   PLT  194  218  212   SEG  66  60  81   LYMPHS  '28  30  15   '$ MONOS  '6  9  3   '$ EOS  '1  1  1               '$ Recent Labs     07/24/17   0429  07/24/17   1737  07/25/17   0636  07/26/17   1246   NA  140  143  138  140   K  2.9*  2.5*  4.2  4.1   CL  101  107  99  99   BICARB  29  26  31*  29   BUN  7*  5*  8  10   CREAT  0.45*  0.40*  0.73  0.68   [place]  8.9  6.8*  8.9  9.1   MG  1.9   --   2.0   --    PHOS  3.1   --   3.6   --      Recent Labs     07/24/17   0429  07/24/17   1737  07/25/17   0636  07/26/17   1246   TP  8.3*  6.3  7.3  8.0   ALB  3.2*  2.7*  3.0*  3.3*   TBILI  1.66*  1.79*  2.50*  2.75*   AST  379*  247*  213*  131*   ALT  209*  208*  232*  179*   ALK  226*  204*  263*  309*          Recent Labs     07/25/17   0637  07/26/17   1246   PT  13.7*  13.5*   INR  1.2  1.2     ASSESSMENT and PLAN:  Emily Ball is a 66 year old female with PMH significant formetastatic uterine cancer s/p TAH/BSO 09/13/2007, completed radiation 12/2007, hypertension, peripheral neuropathy,  morbid obesity, bed bound due  to L hemiplegia and morbid obesity. Patient presents with abdominal pain and found to have acute cholangitis s/p ERCP with stone removal, pending evaluation by surgery if cholecystectomy while inpatient indicated.    #Acute cholangitis s/p stone removal and biliary/pancreatic stent placement.   Patient presented with acute onset abdominal pain, f/c, with CT A/P and RUQ [place] with cholelithiasis and equivocal findings for cholecystitis. MRCP with CBD stones. S/p ERCP today with stone removal, biliary and pancreatitc stent placmenet.   - Cipro/flagyl (10/12-), requires 7-10 course  -c lear liquid diet for 24 hours  - GI consulted appreciate recs   - Appreciate recs from surgery if patient requires cholecystectomy. Per ERCP report, with several stones in cystic duct and gallbladder  - trend LFTs     Chronic conditions:   #Hxmetastatic uterine cancer s/p TAH/BSO 09/13/2007, completed radiation 12/2007  #HTH  #H/o stroke: bed bound due to L hemiplegia, late effect of strokeand morbid obesity  #Hypothyrodism  #iron deficiency anemia  #GERD  Continue outpatient medications: albuterol, gabapentin, latanoprost, hold aspirin  Unfortunately, patient does not remember her dose of synthroid, hydrocodone and name and dose of "anxiety" medication - requested records     #FEN: Diet Clear Liquid  #GI: PPI   #PPx: lovenox    #Dispo: Pending tolerating diet and if cholecystectomy indicated    #Code Status: DNAR - Full Care    ==07/27/2017  INTERVAL:  Hypertensive last night, systolics to 209O-709G; patient refused hydralazine 10 mg IV. Anxious, received lorazepam 0.5 mg PO once.    SUBJECTIVE:   Tearful this morning because of "how [I] treated everyone last night". Tolerated diet: would like to advance diet, doesn't like tea or soup. Denies any current abdominal pain, fevers or chills, nausea or vomiting.     OBJECTIVE:  Vital Signs: Temperature:  [97.3 F (36.3 C)-98.9 F (37.2 C)] 97.3 F (36.3  C) (10/15 0503)  Blood pressure (BP): (100-190)/(44-92) 150/62 (10/15 0730)  Heart Rate:  [55-74] 56 (10/15 0503)  Respirations:  [11-22] 20 (10/15 0730)  Pain Score: 0 (10/15 0730)  O2 Device: None (Room air) (10/15 0730)  O2 Flow Rate (L/min):  [10 l/min] 10 l/min (10/14 1112)  SpO2:  [97 %-100 %] 100 % (10/15 0730)    Physical Exam:  General: obese female, tearful  Neck: No cervical LAD  Lungs: CTAB, no w/r/r  CV: RRR, nml S1/S2, no r/m/g, 2+ peripheral edema  GI: soft, NT, ND, normal BS  Ext: warm and well perfused, peripheral pulses intact bilaterally  Neuro: L sided hemiplegia     Labs:        Recent Labs     07/25/17   0636  07/26/17   1246  07/27/17   0642   WBC  4.8  5.5  5.0   HGB  12.5  12.9  13.2   HCT  39.0  39.9  40.8   MCV  97.5*  97.3*  96.7*   PLT  218  212  245   SEG  60  81  62   LYMPHS  '30  15  31   '$ MONOS  '9  3  7   '$ EOS  1  1   --              Recent Labs     07/24/17   1737  07/25/17   0636  07/26/17   1246  07/27/17   0642   NA  143  138  140  140  K  2.5*  4.2  4.1  3.6   CL  107  99  99  100   BICARB  26  31*  29  28   BUN  5*  '8  10  9   '$ CREAT  0.40*  0.73  0.68  0.58   Emily Ball  6.8*  8.9  9.1  9.4   MG   --   2.0   --   2.3   PHOS   --   3.6   --   3.1     Recent Labs     07/24/17   1737  07/25/17   0636  07/26/17   1246  07/27/17   0642   TP  6.3  7.3  8.0  8.5*   ALB  2.7*  3.0*  3.3*  3.4*   TBILI  1.79*  2.50*  2.75*  1.39*   AST  247*  213*  131*  98*   ALT  208*  232*  179*  153*   ALK  204*  263*  309*  302*          Recent Labs     07/25/17   0637  07/26/17   1246   PT  13.7*  13.5*   INR  1.2  1.2     ASSESSMENT and PLAN:  Emily Ball is a 66 year old female with PMH significant formetastatic uterine cancer s/p TAH/BSO 09/13/2007, completed radiation 12/2007, hypertension, peripheral neuropathy, morbid obesity, bed bound due to L hemiplegia and morbid obesity. Patient presents with abdominal pain and found to have acute cholangitis s/p ERCP with stone removal,  non-surgical management.    #Acute cholangitis s/p stone removal and biliary/pancreatic stent placement.   Cholecystitis. MRCP with CBD stones. S/p ERCP today with stone removal, biliary and pancreatitc stent placement. LFTs downtrending. Per surgery, non-operative management recommended, and may reconsider surgical intervention if symptoms recur.       - Cipro 500 mg PO q12H for 10 days, last dose 08/02/2017       - Metronidazole 500 mg PO q8H for 10 days, last dose 08/02/2017       - Full liquid diet, advance as tolerated       - GI consulted appreciate recs        - Non-operative management per surgery            - f/u with Dr. Lenor Coffin    #Hypothyrodism  - levothyroxine 125 micrograms PO daily    #GERD: lansoprazole 30 mg PO BID    Chronic conditions:   #Hxmetastatic uterine cancer s/p TAH/BSO 09/13/2007, completed radiation 12/2007  #HTH  #H/o stroke: bed bound due to L hemiplegia, late effect of strokeand morbid obesity  #iron deficiency anemia  #glaucoma: continue home latanoprost    #FEN: Full liquid diet, advance as tolerated  #GI: lansoprazole 30 mg PO BID  #PPx: enoxaparin 60 mg IV daily    #Dispo: discharge today

## 2017-07-28 NOTE — Plan of Care (Signed)
Problem: Infection  Goal: Absence of infection signs and symptoms  Outcome: Progressing  Importance of handwashing discussed with patient. Latest WBC is 5.1     . Pt afebrile. Pt on oral antibiotics, may go home later today.      Problem: Nausea/Vomiting  Goal: Absence of nausea  Outcome: Resolved Date Met: 07/28/17  Denies nausea, pt tolerates her meals. BM reported yesterday    Problem: Tissue Perfusion, Cardiopulmonary - Altered  Goal: Hemodynamic stability  Outcome: Progressing  Vital signs stable. Pt denies chest pain or shortness of breath. L sided weakness noted, pt has hx of stroke.     Problem: Skin Integrity- Intact patient high risk for impaired skin integrity Braden scale less than or equal to 18  Goal: Skin remains intact  Outcome: Progressing  Skin is intact, skin care done. Kept pt clean and dry    Problem: Breathing Pattern - Ineffective  Goal: Respiratory rate, rhythm and depth return to baseline  Outcome: Progressing  Respiration regular and unlabored. O2 sat 98% on RA

## 2017-07-28 NOTE — Progress Notes (Signed)
Medicine Progress Note     ID: Emily Ball is a 66 year old female with PMH significant for metastatic uterine cancer s/p TAH/BSO 09/13/2007, completed radiation 12/2007, hypertension, peripheral neuropathy, morbid obesity, bed bound due to L hemiplegia and morbid obesity. Patient presents with abdominal pain and found to have acute cholangitis s/p ERCP with stone removal, non-surgical management per surgery.    INTERVAL:  Anxious in the morning, requiring ativan 0.5 mg PO once    SUBJECTIVE:   Tolerated low fat diet without abdominal pain, nausea, or vomiting. Feeling well and eager to go home.      OBJECTIVE:  Vital Signs: Temperature:  [97.3 F (36.3 C)-98.6 F (37 C)] 98.1 F (36.7 C) (10/16 0534)  Blood pressure (BP): (141-164)/(60-82) 164/82 (10/16 0534)  Heart Rate:  [59-66] 59 (10/16 0534)  Respirations:  [17-20] 18 (10/16 0534)  Pain Score: 0 (10/15 2219)  O2 Device: None (Room air) (10/16 0534)  SpO2:  [95 %-100 %] 95 % (10/16 0534)    Intake and Output    Intake/Output Summary (Last 24 hours) at 07/28/17 0606  Last data filed at 07/28/17 0534   Gross per 24 hour   Intake              243 ml   Output             1400 ml   Net            -1157 ml         Wt Readings from Last 3 Encounters:   07/24/17 133.7 kg (294 lb 12.1 oz)   06/02/17 113.4 kg (250 lb)       Physical Exam:  General: obese female, pleasant, NAD  Neck: No cervical LAD  Lungs: CTAB, no w/r/r  CV: RRR, nml S1/S2, no r/m/g, 2+ peripheral edema  GI: soft, NT, ND, normal BS  Ext: warm and well perfused, peripheral pulses intact bilaterally  Neuro: no facial droop, L sided hemiplegia: 0/5 LUE and LLE, hypersensitive L UE and L LE    Meds:  Current Facility-Administered Medications   Medication    acetaminophen (TYLENOL) tablet 650 mg    Or    acetaminophen (TYLENOL) solution 650 mg    Or    acetaminophen (TYLENOL) suppository 650 mg    albuterol (PROVENTIL) 5 MG/ML nebulizer solution 2.5 mg    ciprofloxacin (CIPRO) tablet 500 mg     enoxaparin (LOVENOX) injection 60 mg    gabapentin (NEURONTIN) capsule 100 mg    influenza vaccine high dose injection 0.5 mL    lansoprazole (PREVACID) DR capsule 30 mg    latanoprost (XALATAN) 0.005 % ophthalmic solution 1 drop    levothyroxine (SYNTHROID) tablet 125 mcg    LORazepam (ATIVAN) solution 0.5 mg    LORazepam (ATIVAN) tablet 0.5 mg    metoclopramide (REGLAN) injection 10 mg    metroNIDAZOLE (FLAGYL) tablet 500 mg    nalOXone (NARCAN) injection 0.1 mg    pneumococcal 23-valent vaccine (PNEUMOVAX-23) injection 0.5 mL    sodium chloride (PF) 0.9 % flush 3 mL    sodium chloride (PF) 0.9 % flush 3 mL    sodium chloride 0.9 % TKO infusion       Labs:  Recent Labs      07/25/17   0636  07/26/17   1246  07/27/17   0642   WBC  4.8  5.5  5.0   HGB  12.5  12.9  13.2  HCT  39.0  39.9  40.8   MCV  97.5*  97.3*  96.7*   PLT  218  212  245   SEG  60  81  62   LYMPHS  _0 MONOS  _1 EOS  1  1   --         Recent Labs      07/25/17   0636  07/26/17   1246  07/27/17   0642   NA  138  140  140   K  4.2  4.1  3.6   CL  99  99  100   BICARB  31*  29  28   BUN  _2 CREAT  0.73  0.68  0.58   Davie  8.9  9.1  9.4   MG  2.0   --   2.3   PHOS  3.6   --   3.1       Recent Labs      07/25/17   0636  07/26/17   1246  07/27/17   0642   TP  7.3  8.0  8.5*   ALB  3.0*  3.3*  3.4*   TBILI  2.50*  2.75*  1.39*   AST  213*  131*  98*   ALT  232*  179*  153*   ALK  263*  309*  302*       Recent Labs      07/25/17   0637  07/26/17   1246   PT  13.7*  13.5*   INR  1.2  1.2       Microbiology:   None     Radiology   MRCP 07/24/17   IMPRESSION:  Findings suspicious for acute cholecystitis and cholangitis.    ERCP:   ENDOSCOPIC DIAGNOSIS  1) Choledocholithiasis - EUS/ERCP performed with stone removal,   biliary and pancreatic stent placement  2) Patient has several stones in the cystic duct and gallbladder   which are also at risk for falling into the bile duct  3) Large amount of retained food in the stomach  likely from delayed   gastric emptying - recommend longer period of liquid diet before   future elective endoscopy procedures        ASSESSMENT and PLAN:  Emily Ball is a 66 year old female with PMH significant for metastatic uterine cancer s/p TAH/BSO 09/13/2007, completed radiation 12/2007, hypertension, peripheral neuropathy, morbid obesity, bed bound due to L hemiplegia and morbid obesity. Patient presents with abdominal pain and found to have acute cholangitis s/p ERCP with stone removal, non-surgical management.     #Acute cholangitis s/p stone removal and biliary/pancreatic stent placement.   Cholecystitis. MRCP with CBD stones. S/p ERCP today with stone removal, biliary and pancreatitc stent placement. LFTs downtrending. Per surgery, non-operative management recommended, and may reconsider surgical intervention if symptoms recur.       - Cipro 500 mg PO q12H for 10 days, last dose 08/02/2017       - Metronidazole 500 mg PO q8H for 10 days, last dose 08/02/2017       - Low fat diet       - GI consulted appreciate recs        - Non-operative management per surgery            - f/u with Dr. Eugene Garnet  [place]    #Hypothyrodism  - levothyroxine 125 micrograms PO daily    #GERD: lansoprazole 30 mg PO BID    Chronic conditions:   #Hx metastatic uterine cancer s/p TAH/BSO 09/13/2007, completed radiation 12/2007  #HTH  #H/o stroke: bed bound due to L hemiplegia, late effect of stroke and morbid obesity  #iron deficiency anemia  #glaucoma: continue home latanoprost    #FEN: Low fat diet  #GI: lansoprazole 30 mg PO BID  #PPx: enoxaparin 60 mg IV daily    #Dispo: discharge today. Previously had difficulty arranging transportation to home due to requirement for a bariatric gurney.      #Code Status: DNAR - Full Care    Jerald Kief, MD, PhD  Internal Medicine, PGY-1    This patient was seen and discussed with my attending Dr. Clifton Custard

## 2017-07-28 NOTE — Plan of Care (Signed)
Problem: Infection  Goal: Absence of infection signs and symptoms  Outcome: Progressing  Patient afebrile this shift. WBC WNL. Patient receiving PO Flagyl for gallstones. CTM for further signs/symptoms of infection. Encourage personal hygiene and CHG bathing. Patient refused CHG this shift. Purewick in place for incontinence. Some pinkness noted on groin. Skin cleaned with peri cleanser. Barrier cream applied.     BP 149/63 (BP Location: Right arm, BP Patient Position: Semi-Fowlers)   Pulse 60   Temp 97.9 F (36.6 C)   Resp 18   Ht 5\' 9"  (1.753 m)   Wt 133.7 kg (294 lb 12.1 oz)   SpO2 98%   BMI 43.53 kg/m2    Results for Emily Ball, Emily Ball (MRN [id number]) as of 07/28/2017 00:41   Ref. Range 07/27/2017 06:42   WBC Latest Ref Range: 4.0 - 10.0 1000/mm3 5.0       Problem: Nausea/Vomiting  Goal: Absence of nausea  Outcome: Progressing  No C/O N/V this shift. Will CTM and encourage oral intake.     Problem: Tissue Perfusion, Cardiopulmonary - Altered  Goal: Hemodynamic stability  Outcome: Progressing  Patient denies chest pain and SOB. Patient does have history of HTN. VS read as follows:    BP 149/63 (BP Location: Right arm, BP Patient Position: Semi-Fowlers)   Pulse 60   Temp 97.9 F (36.6 C)   Resp 18   Ht 5\' 9"  (1.753 m)   Wt 133.7 kg (294 lb 12.1 oz)   SpO2 98%   BMI 43.53 kg/m2    CTM VS Q4 and encourage oral fluids as patient is saline locked.     Problem: Skin Integrity- Intact patient high risk for impaired skin integrity Braden scale less than or equal to 18  Goal: Skin remains intact  Outcome: Progressing  Pt skin remains intact, sacral mepilex in place. Hover mat used for turning and repositioning and cleaning. Purewick in place, skin remains clean and dry. Pinkness noted on groin, barrier cream applied. Pt refusing turns overnight. RN frequently reinforces turning and educated patient on preventing skin breakdown, patient refused turns. CTM and encourage PROM, especially with left side dt history of  stroke.     Problem: Breathing Pattern - Ineffective  Goal: Respiratory rate, rhythm and depth return to baseline  Outcome: Progressing  Denies SOB. Refer to respiratory assessment in flowsheets. VS read as follows:     BP 149/63 (BP Location: Right arm, BP Patient Position: Semi-Fowlers)   Pulse 60   Temp 97.9 F (36.6 C)   Resp 18   Ht 5\' 9"  (1.753 m)   Wt 133.7 kg (294 lb 12.1 oz)   SpO2 98%   BMI 43.53 kg/m2

## 2017-07-29 ENCOUNTER — Telehealth: Payer: Self-pay | Admitting: Internal Medicine

## 2017-07-29 NOTE — Telephone Encounter (Signed)
Best number [phone]  Pt called wanting to get flu /pneumonia and shingles vaccine  Is it ok to schedule?

## 2017-07-29 NOTE — Telephone Encounter (Signed)
Pt wanted to have this done @ [facility] 11/14

## 2017-07-29 NOTE — Telephone Encounter (Signed)
OK 

## 2017-07-29 NOTE — Telephone Encounter (Signed)
Ok to schedule for flu and pneumovax. I would advise her to wait on the shingles.

## 2017-07-29 NOTE — Telephone Encounter (Signed)
Mrs.Mashaw is wanting to switch from Dr. Debara Pickett to Dr. Oval Linsey. Will that be okay ? Thanks

## 2017-07-29 NOTE — Telephone Encounter (Signed)
Whatever makes her happy. I'm ok with the switch.  Dr. Lemmie Evens

## 2017-07-30 ENCOUNTER — Telehealth (HOSPITAL_BASED_OUTPATIENT_CLINIC_OR_DEPARTMENT_OTHER): Payer: Self-pay

## 2017-07-30 LAB — BLOOD CULTURE
Blood Culture Result: NO GROWTH
Blood Culture Result: NO GROWTH

## 2017-07-30 NOTE — Telephone Encounter (Signed)
Transitional Telephonic Nurse  (TTN) Post-Discharge Follow-Up Phone Call   Problems Identified: None    66 yo female w/multiple medical problems including morbid obesity, bed bound due to L hemiplegia and morbid obesity; recent admission for Acute cholangitis s/p stone removal and biliary/pancreatic stent placement per DC Summary.  [place] home 10/16.    TTN spoke with pt who states "so far so good" at home.  States no abdominal pain since returning home; has maintained a no fat diet and is planning on continuing no fat/small amounts for a while since she is having no pain. Normal urine/bowel pattern.  Confirms taking all meds under "Start" on AVS; not taking "Stop" meds.  Declines need to review all routine meds as she manages own meds and has no questions.     States is aware of how to contact Dr Dema Severin should she develop abd pain.  Was not aware she will need to f/u for another ERCP in 8 weeks; AVS indicates pt will be scheduled for ERCP; pt was not able to write phone number down and prefers to wait to be contacted by GI.  Pt was aware she was referred for [facility] but has not received all from agency or CM with update.  TTN will request CM to f/u on any accepting [facility] agency and inform pt.     Pt does express concern with non-emergent transportations as she is bed bound. TTN suggested pt contact her HMO Medicare Advantage plan to discuss transportation options for appts and non-emergent visits.  Pt plans to have her caregiver assist her with this; is also planning on changing to [facility] Oct 13, 2017.     Pt had no further concerns/questions for TTN and was appreciative of f/u call.    Jovita Kussmaul, BSN, RN  Transitional Telephonic Nursing  [place]  Office [phone]  [email or web address]

## 2017-07-30 NOTE — Interdisciplinary (Signed)
I left a vm message for CM Gay Filler at [employer](Sally [phone]) to inform her that we are stilll waiting for authorization for [facility] and that patient already d/c'd from hospital. Awaiting call back. [place]

## 2017-07-31 LAB — GENERAL AEROBIC CULTURE, OR ONLY: Gram Stain Result: NONE SEEN

## 2017-07-31 NOTE — Interdisciplinary (Signed)
I left a vm message for CM Gay Filler at [employer](Sally [phone]) to inform her that we are stilll waiting for authorization for [facility] and that patient already d/c'd from hospital. Awaiting call back. [place]

## 2017-08-03 DIAGNOSIS — I491 Atrial premature depolarization: Secondary | ICD-10-CM

## 2017-08-03 DIAGNOSIS — I498 Other specified cardiac arrhythmias: Secondary | ICD-10-CM

## 2017-08-03 DIAGNOSIS — R001 Bradycardia, unspecified: Secondary | ICD-10-CM

## 2017-08-03 DIAGNOSIS — I4581 Long QT syndrome: Secondary | ICD-10-CM

## 2017-08-03 LAB — ECG 12-LEAD
ATRIAL RATE: 54 {beats}/min
P AXIS: 52 degrees
PR INTERVAL: 164 ms
QRS INTERVAL/DURATION: 88 ms
QT: 538 ms
QTC INTERVAL: 510 ms
R AXIS: 1 degrees
T AXIS: 182 degrees
VENTRICULAR RATE: 54 {beats}/min

## 2017-08-03 LAB — ANAEROBIC CULTURE

## 2017-08-04 NOTE — Utilization Review (Addendum)
PACS Images     Show images for CT Abdomen And Pelvis With Contrast      Exam Information     Status Exam Begun    Exam Ended       Final [99] 07/24/2017 05:26 07/24/2017 05:37      Results    CT Abdomen And Pelvis With Contrast (Accession [id number]) (Order [id number])         Allergies      High: Pcn [Penicillins];  Strawberry;  Sulfa Drugs    Medium: Aspirin-dipyridamole    Unspecified: Nitrous Oxide;  Trazodone;  Zolpidem      Rad Schedule     Schedule Date Schedule Time    07/24/17 5:05 AM      Rad Entry Information     Entry: 07/24/2017 9:42 AM    Resulting Agency: RADIOLOGY            07/24/2017 5:14 PM - Electronic Interface To Epic, Radiant Results      Narrative     EXAM DESCRIPTION:  CT ABDOMEN AND PELVIS WITH CONTRAST    CLINICAL HISTORY:  Abdominal pain, nausea, history of uterine cancer    TECHNIQUE:  COVERAGE: Abdomen and pelvis  IV CONTRAST: 100 ml Omnipaque 350  PHASE(S) ACQUIRED: Portal venous  POSITIVE ORAL CONTRAST GIVEN: No  ADVERSE EVENTS: None  RECONSTRUCTIONS: Axial 3.59mm and sagittal/coronal 3 mm    CTDIvol: 25.3 mGy. DLP: 1400.9 mGy-Cm.    COMPARISON:  None    FINDINGS:  THORAX: Coronary artery calcifications, basal atelectasis  LIVER:Hepatomegaly measuring up to 21.7 cm  BILIARY:Cholelithiasis. Possible areas of gallbladder wall thickening and pericholecystic fluid between the gallbladder and liver (coronal images 30-32). Non-specific prominence of the cystic duct. The pericholecystic fat does not demonstrate inflammatory changes. No extrahepatic bile duct dilation.  PANCREAS: Unremarkable. No pancreatic duct dilation.  SPLEEN: Unremarkable  ADRENAL GLANDS: Unremarkable  KIDNEYS: Bilateral lobulated kidneys. Many bilateral subcentimeter hypoattenuating lesions, too small to characterize  STOMACH/DUODENUM: Small hiatal hernia  VASCULATURE: Atherosclerotic calcification of the abdominal aorta and its major branches  LYMPHATIC: Enlarged lymph node anterior to the IVC (axial  image 33, coronal image 26), measuring 2.3 x 1.2 cm  SMALL & LARGE BOWEL: Diverticulosis of the colon without evidence of diverticulitis  BLADDER/PELVIC ORGANS: Unremarkable  BONES/SOFT TISSUES: Degenerative changes of the thoracolumbar spine, bilateral hips, symphysis pubis  OTHER: None    CONCURRENT SUPERVISION:  I have reviewed the images and agree with the resident interpretation.    DOSE STATEMENT:  "[place] CT scanners employ modern techniques for CT dose reduction, including protocol review, automatic exposure control, and iterative reconstruction techniques. These features assure that radiation dose levels in CT are optimized and are consistent with state-of-the-art, low dose CT practice."        Preliminary created by: Ashok Cordia   Signed by: Britt Bottom 07/24/2017 17:11:34      Impression     IMPRESSION:  CT scan of the abdomen and pelvis with IV contrast.    1. Cholelithiasis with possible areas of gallbladder wall thickening and pericholecystic fluid between the gallbladder and liver without definite pericholecystic fat stranding or extrahepatic bile duct dilation. Findings are equivocal for cholecystitis. Recommend ultrasound to further evaluate.    2. Bilateral lobulated kidneys with many subcentimeter hypoattenuating lesions, too small to characterize.    3. Hepatomegaly, non-specific    Important findings delineated above were seen at 05:53 on 07/24/2017 and were verbally communicated by Dr. Delilah Shan to the  care provider, Kathreen Cornfield, at 05:53 on 07/24/2017. CTRM:2002:verbal.    Important findings delineated above were seen at 09:20 on 07/24/2017 and were verbally communicated by Ashok Cordia to the care provider, Rogelio Seen, at 09:20 on 07/24/2017.  CTRM:2002:verbal.        Rad and Schedule     CT Abdomen And Pelvis With Contrast (Order #[id number]) on 07/24/2017 - Rad and Schedule Information      Result History     CT Abdomen And Pelvis With Contrast  (Order #[id number]) on 07/24/2017 - Order Result History Report      Radiologists     [place] [facility] Signing Physicians    [place] [facility] [place], [place], [place] [[id number]]       Result Information      Status Provider Status       Abnormal Final result (Exam End: 07/24/2017 5:37 AM) Open       External Results Report     Open External Results Report      CT Abdomen And Pelvis With Contrast [[id number]] (Order [id number])   Imaging    Released By: Sharlet Salina, MD (auto-released)   Authorizing: Allena Katz, MD    Date and Time: 07/24/2017 3:33 AM   Department: [facility]         PACS Images     Show images for CT Abdomen And Pelvis With Contrast            Patient Information     Patient Name Sex    Emily Ball, Emily Ball ([id number]) Female    DOB AGE: [birth year] (66 year old)      Systems analyst Authorized By    Allena Katz, MD    Electronically Ordered By: Sharlet Salina, MD       Priority and Order Details     Priority Order Status    STAT Completed    Class: Hospital Performed      Start Date/Time     Start Date Start Time    07/24/17 [phone]       Frequency    ONE TIME         Order Questions     Question Answer Comment    Clinical History: Abdominal pain, nausea, poor bowel function, history of uterine cancer     Reason: D - Cancer Staging/RO Malignancy       Additional Order Details     CT Abdomen And Pelvis With Contrast (Order #[id number]) on 07/24/17      Link(s) to Patient Summary Reports     Page        ====MRI    07/24/2017 5:24 PM - Electronic Interface To Epic, Radiant Results      Narrative     EXAM DESCRIPTION:  MRI ABDOMEN WO/W CONTRAST    CLINICAL HISTORY:  Right upper quadrant, possible cholecystitis and stone    TECHNIQUE:  SCANNER: 3Tesla superconducting magnet  COVERAGE: Abdomen  IV CONTRAST: 10 mL Gadavist  SEQUENCES: Multiplanar, multisequence MR images  were acquired prior to and following contrast administration.    COMPARISON:  Abdomen and pelvis CT 07/24/2017, [place] 07/24/2017    FINDINGS:  LUNG BASES: Subsegmental atelectasis.  LIVER: Unremarkable  BILIARY: Cholelithiasis, sludge, submucosal edema of the gallbladder. Fundal adenomyomatosis. Apparent small stones in the cystic duct as well as within the common bile duct causing mild obstruction. Significant peribiliary perfusion abnormalities with bile duct  enhancement suggesting cholangitis. 8 mm dilation of the common bile duct. 4 mm stone at the ampulla (series 14, image 45).  PANCREAS: Unremarkable  SPLEEN: Unremarkable  ADRENALS: Unremarkable  KIDNEYS: Bilateral foci of T2 hyperintensity, too small to characterize.  VASCULATURE: Unremarkable  LYMPHATIC: Unremarkable  SMALL BOWEL: Visualized portions are unremarkable.  LARGE BOWEL: Visualized portions are unremarkable.  MESENTERY / OMENTUM: Unremarkable  PERITONEAL CAVITY: Unremarkable  ABDOMINAL WALL: Unremarkable  BONES: Unremarkable    CONCURRENT SUPERVISION:  I have reviewed the images and agree with the resident/fellow interpretation.        Preliminary created by: Talbert Forest   Signed by: Westley Hummer 07/24/2017 17:21:16      Impression     IMPRESSION:  Findings suspicious for acute cholecystitis and cholangitis.          ==07/24/2017 ULTRASOUND    07/24/2017 12:21 PM - Electronic Interface To Epic, Radiant Results      Narrative     EXAM DESCRIPTION:  US ABDOMEN LIMITED    CLINICAL HISTORY:  Right upper quadrant abdominal pain. Equivocal findings for cholecystitis on concurrent CT scan.    TECHNIQUE:  Per Protocol    COMPARISON:  Same day CT scan.    FINDINGS:  LIVER:  9.9 cm in long axis.  Normal in echogenicity.  Main portal vein demonstrates slow flow, but is patent with normal direction of flow.    BILIARY: Common bile duct measures 6 mm.  Mild intrahepatic biliary dilation.    GALLBLADDER: Multiple floating gallstones and  biliary sludge with wall thickening up to 4 mm. Small amount of pericholecystic fluid. Sonographic Percell Miller sign is equivocal given premedication.    RIGHT KIDNEY: Poorly imaged, measuring approximately 10.0 cm No hydronephrosis.    PANCREAS: Visualized portion unremarkable.    VASCULAR: visualized aorta and inferior vena cava unremarkable.    OTHER: No ascites.    CONCURRENT SUPERVISION:  I have reviewed the images and agree with the resident's interpretation.        Preliminary created by: Ponciano Ort   Signed byLeane Platt, Masoud 07/24/2017 12:18:18      Impression     IMPRESSION:  Multiple floating gallstone with findings suspicious for acute cholecystitis. If there is clinical concern, hepatobiliary scintigraphy can be performed to confirm the diagnosis.    No biliary duct dilation.

## 2017-08-16 NOTE — Progress Notes (Signed)
Cardiology Office Note   Date:  08/17/2017   ID:  MELVIN MARMO, DOB 19-Oct-1950, MRN [id number]  PCP:  Jearld Fenton, NP  Cardiologist:   Skeet Latch, MD   No chief complaint on file.    History of Present Illness: Morgan Bell is a 66 y.o. female with hypertension, hyperlipiemia, RA, prior stroke (age 63, no residual deficits) and obesity who presents for follow up.  She was previously a patient of Dr. Debara Pickett.  She last saw Dr. Debara Pickett 10/2016 and reported mild exertional dyspnea that was overall stable. She also noted mild lower extremity edema that was attributed to amlodipine.  She was noted to have severely elevated triglycerides and was started on fenofibrate with an improvement from 1118 to 384.  Her blood pressure was poorly-controlled.  Enalapril was switched to irbesartan.  She was also started on rosuvastatin given her hypertriglyceridemia and Rheumatoid arthritis.  She had repeat labs 01/2017 that showed her triglycerides had increased from 384 to 586.  She reports that she takes all her medications as prescribed.    Morgan Bell notes that she has been feeling very tired lately.  She gets short of breath with exertion but denies chest pain.  If she goes shopping 1 day then she is exhausted the following day.  She has not noted any lower extremity edema, orthopnea, or PND.  At her last appointment she was instructed not to exercise,  presumably due to her severely elevated triglycerides.  She thinks that her  fatigue and shortness of breath are attributable to lack of exercise.She checks her blood pressure at home and it has been consistently less than 130/80.  She does not drink alcohol.     Past Medical History:  Diagnosis Date  . Arthritis   . Headache(784.0)    HX MIGRAINES   . Hypertension   . Melanoma ([place])   . Obesity   . Stroke [facility])    MINI STROKE     21 YRS AGO     Past Surgical History:  Procedure Laterality Date  . CESAREAN SECTION  11/26/1973  .  CESAREAN SECTION  09/22/1975  . CESAREAN SECTION  04/30/1978  . HEMORRHOID SURGERY  06/1987  . NM MYOCAR PERF WALL MOTION  2003   negative Bruce protocol exercise stress test with no evidence of perfusion abnormality, EF 66%  . TOTAL HIP ARTHROPLASTY Right 03/2011     Current Outpatient Medications  Medication Sig Dispense Refill  . amLODipine (NORVASC) 10 MG tablet TAKE 1/2 TABLET BY MOUTH TWICE DAILY 30 tablet 11  . amoxicillin (AMOXIL) 500 MG tablet Take 500 mg 2 (two) times daily by mouth. Dental work    . aspirin EC 81 MG tablet Take 81 mg daily by mouth.    . fenofibrate (TRICOR) 145 MG tablet Take 1 tablet (145 mg total) by mouth daily. MUST SCHEDULE ANNUAL EXAM 30 tablet 1  . irbesartan (AVAPRO) 300 MG tablet Take 1 tablet (300 mg total) by mouth daily. 30 tablet 11  . leflunomide (ARAVA) 10 MG tablet Take 20 mg daily by mouth.   2  . loratadine (CLARITIN) 10 MG tablet Take 10 mg by mouth daily.    . meloxicam (MOBIC) 15 MG tablet Take 15 mg by mouth daily.    . Misc Natural Products (TURMERIC CURCUMIN) CAPS Take 1 capsule by mouth daily.    . Multiple Vitamin (MULTIVITAMIN WITH MINERALS) TABS Take 1 tablet by mouth daily.    . OMEGA-3  KRILL OIL PO Take 1 capsule by mouth daily.    . potassium chloride SA (K-DUR,KLOR-CON) 20 MEQ tablet TAKE 1 TABLET BY MOUTH TWICE A DAY 60 tablet 4  . VITAMIN D, ERGOCALCIFEROL, PO Take 2 capsules daily by mouth.    . rosuvastatin (CRESTOR) 20 MG tablet Take 1 tablet (20 mg total) by mouth daily. 90 tablet 3   No current facility-administered medications for this visit.     Allergies:   Sulfa antibiotics    Social History:  The patient  reports that  has never smoked. she has never used smokeless tobacco. She reports that she does not drink alcohol or use drugs.   Family History:  The patient's family history includes Alzheimer's disease in her maternal grandmother and mother; Cancer in her maternal grandfather; Heart attack in her paternal  grandfather; Heart disease in her father; Tuberculosis in her sister.    ROS:  Please see the history of present illness.   Otherwise, review of systems are positive for none.   All other systems are reviewed and negative.    PHYSICAL EXAM: VS:  BP 140/90   Pulse 99   Ht 5' 2.5" (1.588 m)   Wt 75.1 kg (165 lb 9.6 oz)   BMI 29.81 kg/m  , BMI Body mass index is 29.81 kg/m. GENERAL:  Well appearing HEENT:  Pupils equal round and reactive, fundi not visualized, oral mucosa unremarkable NECK:  No jugular venous distention, waveform within normal limits, carotid upstroke brisk and symmetric, no bruits, no thyromegaly LUNGS:  Clear to auscultation bilaterally.  No crackles, wheezes, or rhonchi HEART:  RRR.  PMI not displaced or sustained,S1 and S2 within normal limits, no S3, no S4, no clicks, no rubs, no murmurs ABD:  Central adiposity.  Positive bowel sounds normal in frequency in pitch, no bruits, no rebound, no guarding, no midline pulsatile mass, no hepatomegaly, no splenomegaly EXT:  2 plus pulses throughout, no edema, no cyanosis no clubbing SKIN:  No rashes no nodules NEURO:  Cranial nerves II through XII grossly intact, motor grossly intact throughout PSYCH:  Cognitively intact, oriented to person place and time   EKG:  EKG is ordered today. The ekg ordered today demonstrates sinus rhythm.  Rate 99 bpm.  Poor R wave progression.  LVH with secondary repolarization of normalities.  LAFB   Recent Labs: 11/10/2016: ALT 17; BUN 25; Creat 1.45; Potassium 3.8; Sodium 139    Lipid Panel    Component Value Date/Time   CHOL 198 10/07/2016 0759   TRIG 384.0 (H) 10/07/2016 0759   HDL 37.20 (L) 10/07/2016 0759   CHOLHDL 5 10/07/2016 0759   VLDL 76.8 (H) 10/07/2016 0759   LDLDIRECT 111.0 10/07/2016 0759   01/16/17:  Total cholesterol 228, triglycerides 586, HDL 27, LDL not calculated.    Wt Readings from Last 3 Encounters:  08/17/17 75.1 kg (165 lb 9.6 oz)  10/24/16 79.3 kg (174 lb  12.8 oz)  08/04/16 83.9 kg (185 lb)      ASSESSMENT AND PLAN:  # Hypertriglyceridemia:  # Mixed hyperlipidemia:  Continue fenofibrate and rosuvastatin.  She will likely need to increase rosuvastatin to 40mg .  However, she is also had mildly elevated LFTs.  She is due for laboratory testing with her PCP next week.  She will have a lipid and CMP checked at that time.  I have asked her to have these sent to me so that we can make a decision about where to go with her therapy at  that time.  # Shortness of breath: # Fatigue:   This is likely attributable to obesity and deconditioning.  She is very scared of stress testing after a poor experience with one in the past.  She would like to start exercising and see if she gets better.  I think that this is reasonable.  She will start by walking.  If her symptoms persist we will get a stress testing at her follow-up.  # Hypertension: Blood pressure was initially elevated but improved on repeat and has been well-controlled at home.  Continue amlodipine and irbesartan.     Current medicines are reviewed at length with the patient today.  The patient does not have concerns regarding medicines.  The following changes have been made:  no change  Labs/ tests ordered today include: No orders of the defined types were placed in this encounter.    Disposition:   FU with Tiffany C. Oval Linsey, MD, [facility] in 2 months.     This note was written with the assistance of speech recognition software.  Please excuse any transcriptional errors.  Signed, Tiffany C. Oval Linsey, MD, [facility]  08/17/2017 11:15 AM    [place]

## 2017-08-17 ENCOUNTER — Encounter: Payer: Self-pay | Admitting: Cardiovascular Disease

## 2017-08-17 ENCOUNTER — Ambulatory Visit: Payer: Medicare Other | Admitting: Cardiovascular Disease

## 2017-08-17 VITALS — BP 140/90 | HR 99 | Ht 62.5 in | Wt 165.6 lb

## 2017-08-17 DIAGNOSIS — I10 Essential (primary) hypertension: Secondary | ICD-10-CM | POA: Diagnosis not present

## 2017-08-17 DIAGNOSIS — E781 Pure hyperglyceridemia: Secondary | ICD-10-CM

## 2017-08-17 DIAGNOSIS — R5383 Other fatigue: Secondary | ICD-10-CM | POA: Diagnosis not present

## 2017-08-17 DIAGNOSIS — R0602 Shortness of breath: Secondary | ICD-10-CM

## 2017-08-17 NOTE — Patient Instructions (Signed)
Medication Instructions:  Your physician recommends that you continue on your current medications as directed. Please refer to the Current Medication list given to you today.  Labwork: HAVE YOUR PRIMARY CARE DOCTOR SEND YOUR LABS TO [phone] WHEN YOU HAVE THEM DONE   Testing/Procedures: NONE  Follow-Up: Your physician recommends that you schedule a follow-up appointment in: 2 MONTH OV  Any Other Special Instructions Will Be Listed Below (If Applicable). OK TO START WALKING FOR EXERCISE   If you need a refill on your cardiac medications before your next appointment, please call your pharmacy.

## 2017-08-26 ENCOUNTER — Ambulatory Visit (INDEPENDENT_AMBULATORY_CARE_PROVIDER_SITE_OTHER): Payer: Medicare Other | Admitting: Internal Medicine

## 2017-08-26 ENCOUNTER — Encounter: Payer: Self-pay | Admitting: Internal Medicine

## 2017-08-26 VITALS — BP 138/92 | HR 96 | Temp 97.6°F | Ht 61.75 in | Wt 167.0 lb

## 2017-08-26 DIAGNOSIS — I10 Essential (primary) hypertension: Secondary | ICD-10-CM

## 2017-08-26 DIAGNOSIS — M069 Rheumatoid arthritis, unspecified: Secondary | ICD-10-CM

## 2017-08-26 DIAGNOSIS — G459 Transient cerebral ischemic attack, unspecified: Secondary | ICD-10-CM

## 2017-08-26 DIAGNOSIS — C4359 Malignant melanoma of other part of trunk: Secondary | ICD-10-CM

## 2017-08-26 DIAGNOSIS — Z131 Encounter for screening for diabetes mellitus: Secondary | ICD-10-CM

## 2017-08-26 DIAGNOSIS — E559 Vitamin D deficiency, unspecified: Secondary | ICD-10-CM

## 2017-08-26 DIAGNOSIS — Z0000 Encounter for general adult medical examination without abnormal findings: Secondary | ICD-10-CM | POA: Diagnosis not present

## 2017-08-26 DIAGNOSIS — Z23 Encounter for immunization: Secondary | ICD-10-CM | POA: Diagnosis not present

## 2017-08-26 DIAGNOSIS — E785 Hyperlipidemia, unspecified: Secondary | ICD-10-CM

## 2017-08-26 DIAGNOSIS — E7800 Pure hypercholesterolemia, unspecified: Secondary | ICD-10-CM

## 2017-08-26 NOTE — Assessment & Plan Note (Signed)
No residual effect Lipid profile today Continue Rosuvastatin, Fenofibrate and ASA

## 2017-08-26 NOTE — Assessment & Plan Note (Signed)
Controlled on Amlodipine and Irbesartan CBC and CMET today Will monitor

## 2017-08-26 NOTE — Assessment & Plan Note (Signed)
CMET and Lipid profile today Encouraged her to consume a low fat diet Continue Rosuvastatin and Fenofibrate 

## 2017-08-26 NOTE — Patient Instructions (Signed)
Health Maintenance for Postmenopausal Women Menopause is a normal process in which your reproductive ability comes to an end. This process happens gradually over a span of months to years, usually between the ages of 22 and 9. Menopause is complete when you have missed 12 consecutive menstrual periods. It is important to talk with your health care provider about some of the most common conditions that affect postmenopausal women, such as heart disease, cancer, and bone loss (osteoporosis). Adopting a healthy lifestyle and getting preventive care can help to promote your health and wellness. Those actions can also lower your chances of developing some of these common conditions. What should I know about menopause? During menopause, you may experience a number of symptoms, such as:  Moderate-to-severe hot flashes.  Night sweats.  Decrease in sex drive.  Mood swings.  Headaches.  Tiredness.  Irritability.  Memory problems.  Insomnia.  Choosing to treat or not to treat menopausal changes is an individual decision that you make with your health care provider. What should I know about hormone replacement therapy and supplements? Hormone therapy products are effective for treating symptoms that are associated with menopause, such as hot flashes and night sweats. Hormone replacement carries certain risks, especially as you become older. If you are thinking about using estrogen or estrogen with progestin treatments, discuss the benefits and risks with your health care provider. What should I know about heart disease and stroke? Heart disease, heart attack, and stroke become more likely as you age. This may be due, in part, to the hormonal changes that your body experiences during menopause. These can affect how your body processes dietary fats, triglycerides, and cholesterol. Heart attack and stroke are both medical emergencies. There are many things that you can do to help prevent heart disease  and stroke:  Have your blood pressure checked at least every 1-2 years. High blood pressure causes heart disease and increases the risk of stroke.  If you are 53-22 years old, ask your health care provider if you should take aspirin to prevent a heart attack or a stroke.  Do not use any tobacco products, including cigarettes, chewing tobacco, or electronic cigarettes. If you need help quitting, ask your health care provider.  It is important to eat a healthy diet and maintain a healthy weight. ? Be sure to include plenty of vegetables, fruits, low-fat dairy products, and lean protein. ? Avoid eating foods that are high in solid fats, added sugars, or salt (sodium).  Get regular exercise. This is one of the most important things that you can do for your health. ? Try to exercise for at least 150 minutes each week. The type of exercise that you do should increase your heart rate and make you sweat. This is known as moderate-intensity exercise. ? Try to do strengthening exercises at least twice each week. Do these in addition to the moderate-intensity exercise.  Know your numbers.Ask your health care provider to check your cholesterol and your blood glucose. Continue to have your blood tested as directed by your health care provider.  What should I know about cancer screening? There are several types of cancer. Take the following steps to reduce your risk and to catch any cancer development as early as possible. Breast Cancer  Practice breast self-awareness. ? This means understanding how your breasts normally appear and feel. ? It also means doing regular breast self-exams. Let your health care provider know about any changes, no matter how small.  If you are 40  or older, have a clinician do a breast exam (clinical breast exam or CBE) every year. Depending on your age, family history, and medical history, it may be recommended that you also have a yearly breast X-ray (mammogram).  If you  have a family history of breast cancer, talk with your health care provider about genetic screening.  If you are at high risk for breast cancer, talk with your health care provider about having an MRI and a mammogram every year.  Breast cancer (BRCA) gene test is recommended for women who have family members with BRCA-related cancers. Results of the assessment will determine the need for genetic counseling and BRCA1 and for BRCA2 testing. BRCA-related cancers include these types: ? Breast. This occurs in males or females. ? Ovarian. ? Tubal. This may also be called fallopian tube cancer. ? Cancer of the abdominal or pelvic lining (peritoneal cancer). ? Prostate. ? Pancreatic.  Cervical, Uterine, and Ovarian Cancer Your health care provider may recommend that you be screened regularly for cancer of the pelvic organs. These include your ovaries, uterus, and vagina. This screening involves a pelvic exam, which includes checking for microscopic changes to the surface of your cervix (Pap test).  For women ages 21-65, health care providers may recommend a pelvic exam and a Pap test every three years. For women ages 79-65, they may recommend the Pap test and pelvic exam, combined with testing for human papilloma virus (HPV), every five years. Some types of HPV increase your risk of cervical cancer. Testing for HPV may also be done on women of any age who have unclear Pap test results.  Other health care providers may not recommend any screening for nonpregnant women who are considered low risk for pelvic cancer and have no symptoms. Ask your health care provider if a screening pelvic exam is right for you.  If you have had past treatment for cervical cancer or a condition that could lead to cancer, you need Pap tests and screening for cancer for at least 20 years after your treatment. If Pap tests have been discontinued for you, your risk factors (such as having a new sexual partner) need to be  reassessed to determine if you should start having screenings again. Some women have medical problems that increase the chance of getting cervical cancer. In these cases, your health care provider may recommend that you have screening and Pap tests more often.  If you have a family history of uterine cancer or ovarian cancer, talk with your health care provider about genetic screening.  If you have vaginal bleeding after reaching menopause, tell your health care provider.  There are currently no reliable tests available to screen for ovarian cancer.  Lung Cancer Lung cancer screening is recommended for adults 69-62 years old who are at high risk for lung cancer because of a history of smoking. A yearly low-dose CT scan of the lungs is recommended if you:  Currently smoke.  Have a history of at least 30 pack-years of smoking and you currently smoke or have quit within the past 15 years. A pack-year is smoking an average of one pack of cigarettes per day for one year.  Yearly screening should:  Continue until it has been 15 years since you quit.  Stop if you develop a health problem that would prevent you from having lung cancer treatment.  Colorectal Cancer  This type of cancer can be detected and can often be prevented.  Routine colorectal cancer screening usually begins at  age 42 and continues through age 45.  If you have risk factors for colon cancer, your health care provider may recommend that you be screened at an earlier age.  If you have a family history of colorectal cancer, talk with your health care provider about genetic screening.  Your health care provider may also recommend using home test kits to check for hidden blood in your stool.  A small camera at the end of a tube can be used to examine your colon directly (sigmoidoscopy or colonoscopy). This is done to check for the earliest forms of colorectal cancer.  Direct examination of the colon should be repeated every  5-10 years until age 71. However, if early forms of precancerous polyps or small growths are found or if you have a family history or genetic risk for colorectal cancer, you may need to be screened more often.  Skin Cancer  Check your skin from head to toe regularly.  Monitor any moles. Be sure to tell your health care provider: ? About any new moles or changes in moles, especially if there is a change in a mole's shape or color. ? If you have a mole that is larger than the size of a pencil eraser.  If any of your family members has a history of skin cancer, especially at a young age, talk with your health care provider about genetic screening.  Always use sunscreen. Apply sunscreen liberally and repeatedly throughout the day.  Whenever you are outside, protect yourself by wearing long sleeves, pants, a wide-brimmed hat, and sunglasses.  What should I know about osteoporosis? Osteoporosis is a condition in which bone destruction happens more quickly than new bone creation. After menopause, you may be at an increased risk for osteoporosis. To help prevent osteoporosis or the bone fractures that can happen because of osteoporosis, the following is recommended:  If you are 46-71 years old, get at least 1,000 mg of calcium and at least 600 mg of vitamin D per day.  If you are older than age 55 but younger than age 65, get at least 1,200 mg of calcium and at least 600 mg of vitamin D per day.  If you are older than age 54, get at least 1,200 mg of calcium and at least 800 mg of vitamin D per day.  Smoking and excessive alcohol intake increase the risk of osteoporosis. Eat foods that are rich in calcium and vitamin D, and do weight-bearing exercises several times each week as directed by your health care provider. What should I know about how menopause affects my mental health? Depression may occur at any age, but it is more common as you become older. Common symptoms of depression  include:  Low or sad mood.  Changes in sleep patterns.  Changes in appetite or eating patterns.  Feeling an overall lack of motivation or enjoyment of activities that you previously enjoyed.  Frequent crying spells.  Talk with your health care provider if you think that you are experiencing depression. What should I know about immunizations? It is important that you get and maintain your immunizations. These include:  Tetanus, diphtheria, and pertussis (Tdap) booster vaccine.  Influenza every year before the flu season begins.  Pneumonia vaccine.  Shingles vaccine.  Your health care provider may also recommend other immunizations. This information is not intended to replace advice given to you by your health care provider. Make sure you discuss any questions you have with your health care provider. Document Released: 11/21/2005  Document Revised: 04/18/2016 Document Reviewed: 07/03/2015 Elsevier Interactive Patient Education  2018 [employer]  

## 2017-08-26 NOTE — Assessment & Plan Note (Signed)
Continue Meloxicam and Arava She will continue to follow with Dr. Amil Amen

## 2017-08-26 NOTE — Progress Notes (Signed)
HPI:  Pt presents to the clinic today for her Medicare Wellness Exam. She is also due for follow up of chronic conditions.  Rheumatoid Arthritis: She is taking Meloxicam and Arava as prescribed. She feels like her pain is well controlled. She follows with Dr. Amil Amen.  HTN: Her BP today is 138/92. She is taking Amlodipine and Irbesartan. She follows with Dr. Tamera Punt. ECG from 08/2017 reviewed.  Hx of Melanoma: s/p excision in 2014. She follows with Dr. Allyson Sabal yearly.   TIA: No residual effects. She is taking Rosuvastatin, Fenofibrate and ASA as prescribed.   Past Medical History:  Diagnosis Date  . Arthritis   . Headache(784.0)    HX MIGRAINES   . Hypertension   . Melanoma ([place])   . Obesity   . Stroke [facility])    MINI STROKE     21 YRS AGO     Current Outpatient Medications  Medication Sig Dispense Refill  . amLODipine (NORVASC) 10 MG tablet TAKE 1/2 TABLET BY MOUTH TWICE DAILY 30 tablet 11  . amoxicillin (AMOXIL) 500 MG tablet Take 500 mg 2 (two) times daily by mouth. Dental work    . aspirin EC 81 MG tablet Take 81 mg daily by mouth.    . fenofibrate (TRICOR) 145 MG tablet Take 1 tablet (145 mg total) by mouth daily. MUST SCHEDULE ANNUAL EXAM 30 tablet 1  . irbesartan (AVAPRO) 300 MG tablet Take 1 tablet (300 mg total) by mouth daily. 30 tablet 11  . leflunomide (ARAVA) 10 MG tablet Take 20 mg daily by mouth.   2  . loratadine (CLARITIN) 10 MG tablet Take 10 mg by mouth daily.    . meloxicam (MOBIC) 15 MG tablet Take 15 mg by mouth daily.    . Misc Natural Products (TURMERIC CURCUMIN) CAPS Take 1 capsule by mouth daily.    . Multiple Vitamin (MULTIVITAMIN WITH MINERALS) TABS Take 1 tablet by mouth daily.    . OMEGA-3 KRILL OIL PO Take 1 capsule by mouth daily.    . potassium chloride SA (K-DUR,KLOR-CON) 20 MEQ tablet TAKE 1 TABLET BY MOUTH TWICE A DAY 60 tablet 4  . rosuvastatin (CRESTOR) 20 MG tablet Take 1 tablet (20 mg total) by mouth daily. 90 tablet 3  . VITAMIN D,  ERGOCALCIFEROL, PO Take 2 capsules daily by mouth.     No current facility-administered medications for this visit.     Allergies  Allergen Reactions  . Sulfa Antibiotics     SULFA DRUGS    Family History  Problem Relation Age of Onset  . Alzheimer's disease Mother   . Heart disease Father        MI, died at 34  . Alzheimer's disease Maternal Grandmother   . Cancer Maternal Grandfather        lung  . Heart attack Paternal Grandfather   . Tuberculosis Sister     Social History   Socioeconomic History  . Marital status: Divorced    Spouse name: Not on file  . Number of children: 3  . Years of education: 58  . Highest education level: Not on file  Social Needs  . Financial resource strain: Not on file  . Food insecurity - worry: Not on file  . Food insecurity - inability: Not on file  . Transportation needs - medical: Not on file  . Transportation needs - non-medical: Not on file  Occupational History    Employer: [employer]    Comment: Network engineer; raises service dogs.  Tobacco Use  . Smoking status: Never Smoker  . Smokeless tobacco: Never Used  Substance and Sexual Activity  . Alcohol use: No  . Drug use: No  . Sexual activity: No  Other Topics Concern  . Not on file  Social History Narrative  . Not on file    Hospitiliaztions: None  Health Maintenance:    Flu: 07/2016  Tetanus: > 10 years  Pneumovax: never  Prevnar: 07/2016  Zostavax: never  Mammogram: > 2 years ago  Pap Smear: > 5 years ago  Bone Density: unsure  Colon Screening: 2007 at [place] Doctor: annually  Dental Exam: annually   Providers:   PCP: Webb Silversmith, NP-C  Dermatologist: Dr. Amil Amen  Cardiologist: Dr. Tamera Punt  Dermatology: Dr. Allyson Sabal    I have personally reviewed and have noted:  1. The patient's medical and social history 2. Their use of alcohol, tobacco or illicit drugs 3. Their current medications and supplements 4. The patient's functional  ability including ADL's, fall risks, home safety risks and hearing or visual impairment. 5. Diet and physical activities 6. Evidence for depression or mood disorder  Subjective:   Review of Systems:   Constitutional: Denies fever, malaise, fatigue, headache or abrupt weight changes.  HEENT: Denies eye pain, eye redness, ear pain, ringing in the ears, wax buildup, runny nose, nasal congestion, bloody nose, or sore throat. Respiratory: Denies difficulty breathing, shortness of breath, cough or sputum production.   Cardiovascular: Denies chest pain, chest tightness, palpitations or swelling in the hands or feet.  Gastrointestinal: Denies abdominal pain, bloating, constipation, diarrhea or blood in the stool.  GU: Denies urgency, frequency, pain with urination, burning sensation, blood in urine, odor or discharge. Musculoskeletal: Pt reports joint pains in hand. Denies decrease in range of motion, difficulty with gait, muscle pain or joint swelling.  Skin: Denies redness, rashes, lesions or ulcercations.  Neurological: Denies dizziness, difficulty with memory, difficulty with speech or problems with balance and coordination.  Psych: Denies anxiety, depression, SI/HI.  No other specific complaints in a complete review of systems (except as listed in HPI above).  Objective:  BP (!) 138/92   Pulse 96   Temp 97.6 F (36.4 C) (Oral)   Ht 5' 1.75" (1.568 m)   Wt 167 lb (75.8 kg)   SpO2 97%   BMI 30.79 kg/m   Wt Readings from Last 3 Encounters:  08/17/17 165 lb 9.6 oz (75.1 kg)  10/24/16 174 lb 12.8 oz (79.3 kg)  08/04/16 185 lb (83.9 kg)    General: Appears her stated age, obese, in NAD. Skin: Warm, dry and intact. HEENT: Head: normal shape and size; Eyes: sclera white, no icterus, conjunctiva pink, PERRLA and EOMs intact; Ears: Tm's gray and intact, normal light reflex; Throat/Mouth: Teeth present, mucosa pink and moist, no exudate, lesions or ulcerations noted.  Neck: Neck supple,  trachea midline. No masses, lumps or thyromegaly present.  Cardiovascular: Normal rate and rhythm. S1,S2 noted.  No murmur, rubs or gallops noted. No JVD or BLE edema. No carotid bruits noted. Pulmonary/Chest: Normal effort and positive vesicular breath sounds. No respiratory distress. No wheezes, rales or ronchi noted.  Abdomen: Soft and nontender. Normal bowel sounds. No distention or masses noted. Liver, spleen and kidneys non palpable. Musculoskeletal:Joint deformity noted in hands. Strength 5/5 BUE/BLE.  Neurological: Alert and oriented. Cranial nerves II-XII grossly intact. Coordination normal.  Psychiatric: Mood and affect normal. Behavior is normal. Judgment and thought content normal.    BMET  Component Value Date/Time   NA 139 11/10/2016 1418   K 3.8 11/10/2016 1418   CL 104 11/10/2016 1418   CO2 23 11/10/2016 1418   GLUCOSE 114 (H) 11/10/2016 1418   BUN 25 11/10/2016 1418   CREATININE 1.45 (H) 11/10/2016 1418   CALCIUM 10.1 11/10/2016 1418   GFRNONAA 46 (L) 02/28/2013 1545   GFRAA 54 (L) 02/28/2013 1545    Lipid Panel     Component Value Date/Time   CHOL 198 10/07/2016 0759   TRIG 384.0 (H) 10/07/2016 0759   HDL 37.20 (L) 10/07/2016 0759   CHOLHDL 5 10/07/2016 0759   VLDL 76.8 (H) 10/07/2016 0759    CBC    Component Value Date/Time   WBC 8.9 08/04/2016 1511   RBC 4.91 08/04/2016 1511   HGB 13.9 08/04/2016 1511   HCT 40.8 08/04/2016 1511   PLT 201.0 08/04/2016 1511   MCV 83.2 08/04/2016 1511   MCV 93.1 01/16/2014 2105   MCH 28.8 01/16/2014 2105   MCH 26.2 02/28/2013 1545   MCHC 34.1 08/04/2016 1511   RDW 14.6 08/04/2016 1511   LYMPHSABS 2.3 02/28/2013 1545   MONOABS 1.0 02/28/2013 1545   EOSABS 0.3 02/28/2013 1545   BASOSABS 0.1 02/28/2013 1545    Hgb A1C Lab Results  Component Value Date   HGBA1C 5.4 08/04/2016      Assessment and Plan:   Medicare Annual Wellness Visit:  Diet: She does eat meat. She consumes a fruits and veggies. She  tries to avoid fried food. She drinks mostly water, some coffee. Physical activity: None Depression/mood screen: Negative Hearing: Intact to whispered voice Visual acuity: Grossly normal, performs annual eye exam  ADLs: Capable Fall risk: None Home safety: Good Cognitive evaluation: Intact to orientation, naming, recall and repetition EOL planning: No adv directives, full code/ I agree  Preventative Medicine: Flu and penumovax today. Prevnar UTD. She declines tetanus. She will call her insurance company about Shingrix. She declines mammogram, pap smear, bone density or colon cancer screening. Encouraged her to consume a balanced diet and exercise regimen. Advised her to see an eye doctor and dentist annually. Will check CBC, CMET, Lipid, A1C and Vit D today.   Next appointment: 1 year, Medicare Wellness Exam   Webb Silversmith, NP

## 2017-08-26 NOTE — Assessment & Plan Note (Signed)
No reoccurrence She will see dermatology yearly

## 2017-08-26 NOTE — Addendum Note (Signed)
Addended by: Lurlean Nanny on: 08/26/2017 04:29 PM   Modules accepted: Orders

## 2017-08-27 LAB — COMPREHENSIVE METABOLIC PANEL
ALT: 30 U/L (ref 0–35)
AST: 41 U/L — ABNORMAL HIGH (ref 0–37)
Albumin: 4.2 g/dL (ref 3.5–5.2)
Alkaline Phosphatase: 63 U/L (ref 39–117)
BUN: 20 mg/dL (ref 6–23)
CO2: 26 mEq/L (ref 19–32)
Calcium: 10.3 mg/dL (ref 8.4–10.5)
Chloride: 103 mEq/L (ref 96–112)
Creatinine, Ser: 1.22 mg/dL — ABNORMAL HIGH (ref 0.40–1.20)
GFR: 46.48 mL/min — ABNORMAL LOW (ref >60.00)
Glucose, Bld: 98 mg/dL (ref 70–99)
Potassium: 4.1 mEq/L (ref 3.5–5.1)
Sodium: 138 mEq/L (ref 135–145)
Total Bilirubin: 0.4 mg/dL (ref 0.2–1.2)
Total Protein: 7.1 g/dL (ref 6.0–8.3)

## 2017-08-27 LAB — LIPID PANEL
Cholesterol: 152 mg/dL (ref 0–200)
HDL: 14.2 mg/dL — ABNORMAL LOW (ref >39.00)
Total CHOL/HDL Ratio: 11
Triglycerides: 490 mg/dL — ABNORMAL HIGH (ref 0.0–149.0)

## 2017-08-27 LAB — CBC
HCT: 42 % (ref 36.0–46.0)
Hemoglobin: 13.6 g/dL (ref 12.0–15.0)
MCHC: 32.4 g/dL (ref 30.0–36.0)
MCV: 87.3 fl (ref 78.0–100.0)
Platelets: 211 10*3/uL (ref 150.0–400.0)
RBC: 4.81 Mil/uL (ref 3.87–5.11)
RDW: 14.8 % (ref 11.5–15.5)
WBC: 6.1 10*3/uL (ref 4.0–10.5)

## 2017-08-27 LAB — LDL CHOLESTEROL, DIRECT: Direct LDL: 78 mg/dL

## 2017-08-27 LAB — HEMOGLOBIN A1C: Hgb A1c MFr Bld: 5.7 % (ref 4.6–6.5)

## 2017-08-27 LAB — VITAMIN D 25 HYDROXY (VIT D DEFICIENCY, FRACTURES): VITD: 72.93 ng/mL (ref 30.00–100.00)

## 2017-08-28 ENCOUNTER — Other Ambulatory Visit: Payer: Self-pay | Admitting: Internal Medicine

## 2017-10-14 DIAGNOSIS — I10 Essential (primary) hypertension: Secondary | ICD-10-CM | POA: Insufficient documentation

## 2017-10-21 DIAGNOSIS — M0589 Other rheumatoid arthritis with rheumatoid factor of multiple sites: Secondary | ICD-10-CM | POA: Diagnosis not present

## 2017-10-21 DIAGNOSIS — Z6830 Body mass index (BMI) 30.0-30.9, adult: Secondary | ICD-10-CM | POA: Diagnosis not present

## 2017-10-21 DIAGNOSIS — M5136 Other intervertebral disc degeneration, lumbar region: Secondary | ICD-10-CM | POA: Diagnosis not present

## 2017-10-21 DIAGNOSIS — Z79899 Other long term (current) drug therapy: Secondary | ICD-10-CM | POA: Diagnosis not present

## 2017-10-26 ENCOUNTER — Other Ambulatory Visit: Payer: Self-pay

## 2017-10-27 ENCOUNTER — Encounter (INDEPENDENT_AMBULATORY_CARE_PROVIDER_SITE_OTHER): Payer: Self-pay | Admitting: Internal Medicine

## 2017-10-27 ENCOUNTER — Ambulatory Visit (INDEPENDENT_AMBULATORY_CARE_PROVIDER_SITE_OTHER): Payer: 59 | Admitting: Internal Medicine

## 2017-10-27 VITALS — BP 153/84 | HR 64 | Temp 98.2°F | Ht 69.0 in

## 2017-10-27 DIAGNOSIS — B3731 Acute candidiasis of vulva and vagina: Secondary | ICD-10-CM

## 2017-10-27 DIAGNOSIS — R490 Dysphonia: Secondary | ICD-10-CM

## 2017-10-27 DIAGNOSIS — Z1339 Encounter for screening examination for other mental health and behavioral disorders: Secondary | ICD-10-CM

## 2017-10-27 DIAGNOSIS — E039 Hypothyroidism, unspecified: Principal | ICD-10-CM

## 2017-10-27 DIAGNOSIS — K219 Gastro-esophageal reflux disease without esophagitis: Secondary | ICD-10-CM

## 2017-10-27 DIAGNOSIS — G8190 Hemiplegia, unspecified affecting unspecified side: Secondary | ICD-10-CM

## 2017-10-27 DIAGNOSIS — K5900 Constipation, unspecified: Secondary | ICD-10-CM

## 2017-10-27 DIAGNOSIS — R0602 Shortness of breath: Secondary | ICD-10-CM

## 2017-10-27 DIAGNOSIS — B373 Candidiasis of vulva and vagina: Secondary | ICD-10-CM

## 2017-10-27 DIAGNOSIS — Z8719 Personal history of other diseases of the digestive system: Secondary | ICD-10-CM

## 2017-10-27 DIAGNOSIS — R52 Pain, unspecified: Secondary | ICD-10-CM

## 2017-10-27 DIAGNOSIS — S3140XA Unspecified open wound of vagina and vulva, initial encounter: Secondary | ICD-10-CM

## 2017-10-27 DIAGNOSIS — G629 Polyneuropathy, unspecified: Secondary | ICD-10-CM

## 2017-10-27 DIAGNOSIS — Z1389 Encounter for screening for other disorder: Secondary | ICD-10-CM

## 2017-10-27 MED ORDER — ALBUTEROL SULFATE 108 (90 BASE) MCG/ACT IN AERS
2.0000 | INHALATION_SPRAY | Freq: Four times a day (QID) | RESPIRATORY_TRACT | 3 refills | Status: DC | PRN
Start: 2017-10-27 — End: 2018-04-23

## 2017-10-27 MED ORDER — SERTRALINE HCL 25 MG OR TABS
25.00 mg | ORAL_TABLET | Freq: Two times a day (BID) | ORAL | Status: DC
Start: ? — End: 2018-04-22

## 2017-10-27 MED ORDER — POLYETHYLENE GLYCOL 3350 OR POWD
17.0000 g | Freq: Every day | ORAL | 0 refills | Status: DC
Start: 2017-10-27 — End: 2018-04-22

## 2017-10-27 MED ORDER — LANSOPRAZOLE 30 MG OR CPDR
30.0000 mg | DELAYED_RELEASE_CAPSULE | Freq: Two times a day (BID) | ORAL | 3 refills | Status: DC
Start: 2017-10-27 — End: 2018-04-30

## 2017-10-27 MED ORDER — GABAPENTIN 100 MG OR CAPS
300.0000 mg | ORAL_CAPSULE | Freq: Three times a day (TID) | ORAL | 3 refills | Status: DC
Start: 2017-10-27 — End: 2018-04-22

## 2017-10-27 MED ORDER — HYDROCODONE-ACETAMINOPHEN 5-325 MG OR TABS
1.0000 | ORAL_TABLET | Freq: Four times a day (QID) | ORAL | 0 refills | Status: DC | PRN
Start: 2017-10-27 — End: 2018-03-12

## 2017-10-27 MED ORDER — CONTROLLED SUBSTANCE AGREEMENT
0 refills | Status: AC
Start: 2017-10-27 — End: 2018-10-27

## 2017-10-27 MED ORDER — SENNA 8.6 MG OR TABS
8.6000 mg | ORAL_TABLET | Freq: Every day | ORAL | 3 refills | Status: DC
Start: 2017-10-27 — End: 2018-04-30

## 2017-10-27 MED ORDER — HYDROCODONE-ACETAMINOPHEN 5-325 MG OR TABS
1.00 | ORAL_TABLET | Freq: Four times a day (QID) | ORAL | Status: DC | PRN
Start: ? — End: 2017-10-27

## 2017-10-27 MED ORDER — NYSTOP 100000 UNIT/GM EX POWD
1.0000 | Freq: Two times a day (BID) | CUTANEOUS | 3 refills | Status: DC
Start: 2017-10-27 — End: 2018-04-22

## 2017-10-27 NOTE — Progress Notes (Signed)
ATTENDING NOTE:    Chief Complaint   Patient presents with    Establish Care       SUBJECTIVE:   I reviewed the history and interviewed the patient.    History of present illness:  Emily Ball is a 67 year old female who presents to establish care. Pt has CVA with left hemiparesis, HTN, morbid obesity, hx of cholangitis last year, hypothyroidism.     Review of Systems: As per resident's note.    Past Medical, Family, Social History: Reviewed and updated as per resident note.    OBJECTIVE: BP 153/84 (BP Location: Right arm, BP Patient Position: Sitting, BP cuff size: Large)    Pulse 64    Temp 98.2 F (36.8 C) (Temporal)    Ht 5\' 9"  (1.753 m)    SpO2 97%    BMI 43.53 kg/m     I have examined the patient and I concur with the resident's exam.    MEDICAL PLAN of CARE:   Assessment and plan reviewed with the resident physician.  I agree with the resident's plans as documented.    Reviewed CURES - no aberrant refills and consistent w/ patient's reported hx. Last norco filled 09/21/17. Last lorazepam filled 10/26/16.   Discussed ENT, GI referral, [facility] referral.   Narcotic contract signed by pt today.   Constipation bowel regimen.   Screening labs.     See the resident's note for further details.

## 2017-10-27 NOTE — Progress Notes (Signed)
Chief Complaint   Patient presents with    Establish Care         SUBJECTIVE::Emily Ball is a 67 year old female here for annual exam and  follow up of issues below.      #Itching  Months  Present all the time.  Benadryl helps her fall asleep  Has not seen a dermatologist  Has not had allergies    #CVA c/b hemiplegia  Patient is bed bound at baseline. Is unable to get out off bed on her own. Requires total assistance from her caregiver. Has an old bariatric bed currently which is not functioning as it should. Caregiver also needing assistance because she feels burn out from caring for patient on a daily basis. Has been doing this for years without help.    #GERD  Was on prevacid before which was ameliorating symptoms. However has recently stopped because she ran out. Would like a refill prior to seeing GI.    #Anxiety  Takes lorazepam 1-2 times daily. Has been on this dose for years without side effects. Usually anxious 2/2 immobility. Lorazapem helps her symptoms.    #Chronic Pain   Has been on Norco 5, 1/2 a tab as needed for several years. Usually addresses thigh and back pain. Says that without it she would be unable to get out off the house for appointments. Would like [place] to be in charge of prescribing refills for her now.    #Constipation  Has only small pellets of stool at least 1-2 times per day, and then has to strain to have a significantly larger bowel movement. This is extremely uncomfortable for her and she would like a stool softener. Has been on miralax before which helped her symptoms.    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 Has had this open stoma for 4-5 years. Causing voice hoarseness and would like to see if it could be closed.    #Neuropathy  Has shooting pains down her LLE that come on suddenly. Intermittent pain. Feels sharp, like a lightning bolt. Pain is unbearable when it happens. Has been on gabapentin 200 mg which has not  addressed her pain.     #Labial wound  Has had a labial "boil" for at least 2 months. She says it is uncomfortable and itchy. Would like to be examined by female practitioner and see if it can be removed.      Patient Active Problem List    Diagnosis Date Noted    Hypothyroidism, unspecified type 10/27/2017    History of cholangitis 10/27/2017    Open wound of genital labia, initial encounter 10/27/2017    Hemiplegia, unspecified etiology, unspecified hemiplegia type, unspecified laterality (CMS-HCC) 10/27/2017    Cholangitis 07/24/2017    Vomiting 07/24/2017       Past medical history, surgical history, and family history reviewed and updated today as below.  Allergies and medications reviewed and updated as below.    Past Medical History:   Diagnosis Date    Cholangitis     CVA (cerebral vascular accident) (CMS-HCC)     Hypertension     Left-sided muscle weakness     Obesity     Stroke (CMS-HCC)     Uterine cancer (CMS-HCC)        Past Surgical History:   Procedure Laterality Date    HYSTERECTOMY      THYROIDECTOMY         No family history on  file.    Social History     Tobacco Use    Smoking status: Never Smoker    Smokeless tobacco: Never Used   Substance Use Topics    Alcohol use: No     Frequency: Never    Drug use: No     Social History     Social History Narrative    Not on file     Obstetric History     No data available       Allergies   Allergen Reactions    Pcn [Penicillins] Anaphylaxis    Strawberry Rash     Fresh strawberries per patient.     Sulfa Drugs Rash    Aspirin-Dipyridamole Unspecified     pruritus    Nitrous Oxide Other     Gets paranoid from, given at dentist before    Trazodone Other     Nightmares    Zolpidem Unspecified     Doesn't help sleep and gets anxious       Current Outpatient Medications   Medication Sig    albuterol 108 (90 Base) MCG/ACT inhaler Inhale 2 puffs by mouth every 6 hours as needed for Wheezing.    aspirin 81 MG chewable tablet Take 81 mg  by mouth daily.    fluticasone propionate (FLONASE) 50 MCG/ACT nasal spray Spray 2 sprays into each nostril daily.    gabapentin (NEURONTIN) 100 MG capsule Take 3 capsules (300 mg) by mouth 3 times daily.    HYDROcodone-acetaminophen (NORCO) 5-325 MG tablet Take 1 tablet by mouth every 6 hours as needed for Moderate Pain (Pain Score 4-6).    hydrocortisone 1 % ointment Apply 1 Application topically 4 times daily.    lansoprazole (PREVACID) 30 MG capsule Take 1 capsule (30 mg) by mouth 2 times daily.    levothyroxine (SYNTHROID) 125 MCG tablet Take 1 tablet (125 mcg) by mouth every morning (before breakfast).    LORazepam (ATIVAN) 0.5 MG tablet Take 0.5 mg by mouth 2 times daily as needed.    magnesium citrate ([employer] CITRATE OF MAGNESIA) 1.745 GM/30ML SOLN Take 1 bottle by mouth daily as needed for Constipation.    nitroGLYcerin (NITROSTAT) 0.4 MG SL tablet 0.5 mg by Sublingual route every 5 minutes as needed.    nystatin (NYSTOP) 100000 UNIT/GM powder Apply 1 Application topically 2 times daily.    nystatin (NYSTOP) 100000 UNIT/GM powder Apply 1 Application topically 2 times daily.    opioid agreement opioid agreement    polyethylene glycol (GLYCOLAX) powder Take 17 g by mouth daily.    senna (SENOKOT) 8.6 MG tablet Take 1 tablet (8.6 mg) by mouth daily.    sertraline (ZOLOFT) 25 MG tablet Take 25 mg by mouth 2 times daily.    vitamin B-12 (CYANOCOBALAMIN) 1000 MCG tablet Take 1,000 mcg by mouth daily.     No current facility-administered medications for this visit.        ROS: Positive findings are in bold:  Constitutional: negative for: fatigue, night sweats, loss of energy, anorexia, fever  Eyes: negative for: eye pain, red eyes, blurry vision  ENMT: negative for: hearing loss, ear pain, odynophagia  CV: negative for palpitations, chest pain, lower extremity edema  Resp: negative for: cough, SOB, DOE  GI: negative for: nausea, vomiting, abdominal pain, constipation, diarrhea, rectal bleeding or  black stools  GU: negative for: nocturia, dysuria, hematuria, incontinence  MSK: negative for: back pain, muscle pain, joint pain, joint swelling, joint redness  Neuro: negative for: confusion,  headaches, lightheadedness, vertigo, weakness, tremor  Psych: negative for: depressed mood, anxiety, trouble sleeping, hallucinations, impaired concentration, stress  Allergy/Immun: negative for: stuffy nose and sinuses, itchy/red/tearing eyes  Skin: no new skin rashes or lesions      OBJECTIVE:: BP 153/84 (BP Location: Right arm, BP Patient Position: Sitting, BP cuff size: Large)    Pulse 64    Temp 98.2 F (36.8 C) (Temporal)    Ht '5\' 9"'$  (1.753 m)    SpO2 97%    BMI 43.53 kg/m   Physical Exam:    CONSTITUTIONAL: morbidly obese woman in wheelchair INAD  EYES: PERRL  ENMT: MMM  NECK:  No LAD, stoma open without drainage  HEART:  RRR, normal S1/S2, no m/r/g  RESP:  CTAB  ABDOMEN:  Soft, NTND, NBS, no organomegaly  SKIN:  No erythema or cyanosis  GYN: unable to perform 2/2 weight and immobility  EXTREMITY: bilateral lower extremity edema, L>R      Lab and imaging data:  CBC  Lab Results   Component Value Date    WBC 5.1 07/28/2017    HGB 13.3 07/28/2017    HCT 41.4 07/28/2017    MCV 97.6 (H) 07/28/2017    PLT 216 07/28/2017     Chemistry  Lab Results   Component Value Date    NA 140 07/28/2017    K 3.3 (L) 07/28/2017    CL 101 07/28/2017    BICARB 30 (H) 07/28/2017    BUN 12 07/28/2017    CREAT 0.66 07/28/2017    GLU 86 07/28/2017     Liver  Lab Results   Component Value Date    TBILI 0.90 07/28/2017    AST 178 (H) 07/28/2017    ALT 166 (H) 07/28/2017    ALK 276 (H) 07/28/2017    TP 8.1 (H) 07/28/2017    ALB 3.3 (L) 07/28/2017     Coags  Lab Results   Component Value Date    INR 1.2 07/26/2017    PTT 33 06/02/2017         A/P: Hessie was seen today for establish care.    Diagnoses and all orders for this visit:    Hypothyroidism, unspecified type: last TSH in Oct, 2018 was 19. Will recheck labs today.  -     TSH, Blood - See  Instructions; Future  -     Free Thyroxine, Blood Green Plasma Separator Tube; Future    History of cholangitis: should have followed up several weeks ago. Will refer as urgent and obtain lab work. LFTs should be improving at this time otherwise may need to call patient for expedited eval in ED.  -     Gastroenterology Clinic  -     Comprehensive Metabolic Panel Green Plasma Separator Tube; Future  -     CBC w/ Diff Lavender; Future    Open wound of genital labia, initial encounter: unable to assess but appreciate [facility] services to properly evaluate patient and consider I&D if really is a boil. Referred as urgent.  -     [facility] Clinic    Candida vaginitis: unable to assess 2/2 weight and inappropriate room capability. However caregiver had pictures which looked like dry scaly skin and pigmentation changes. Given weight and immobility not unlikely that this is candida and will trial nystatin in addition to desitin or other barrier to assist with moisture.  -     nystatin (NYSTOP) 100000 UNIT/GM powder; Apply 1 Application topically 2  times daily.    Hoarseness: referral for open stoma and hoarseness. Would like to see what ENT recommends for this and if can be closed.   -     ENT Clinic    Gastroesophageal reflux disease, esophagitis presence not specified  -     lansoprazole (PREVACID) 30 MG capsule; Take 1 capsule (30 mg) by mouth 2 times daily.    Hemiplegia, unspecified etiology, unspecified hemiplegia type, unspecified laterality (CMS-HCC):  Please see above HPI. Patient qualifies for bariatric bed given weight and immobility from prior CVA. Trapeze system is the only way she would be able to help her caregiver get out off bed. Otherwise her weight is limiting the ability for caregiver to assist. Also agree that there may be a component of caregiver burnout that may be assisted by home health aid. Home health PT would also help patient with lower extremity edema that is likely 2/2  immobility. Would only need 5-7 sessions and then teach caregiver/home health aid which exercises to help patient with.  -     DME Misc Order: bariatric bed  -     DME Misc Order: trapeze bed system  -     Consult/Referral to Home Health    Constipation, unspecified constipation type  -     polyethylene glycol (GLYCOLAX) powder; Take 17 g by mouth daily.  -     senna (SENOKOT) 8.6 MG tablet; Take 1 tablet (8.6 mg) by mouth daily.    Neuropathy (CMS-HCC):   Will increase neurontin from 200 mg to 900 mg daily. Told about side-effects of drowsiness but patient does not drive and immobile. If does not address symptoms consider increasing dose further.   -     gabapentin (NEURONTIN) 100 MG capsule; Take 3 capsules (300 mg) by mouth 3 times daily.  -     opioid agreement; opioid agreement    Pain:  Pain contract signed in clinic today. Told to get UTox if possible (although has difficulty giving urine samples 2/2 weight and immobility). Will be prescribing Norco for her from now on.  -     HYDROcodone-acetaminophen (NORCO) 5-325 MG tablet; Take 1 tablet by mouth every 6 hours as needed for Moderate Pain (Pain Score 4-6).  -     Urine Immunoassay Drug Screen; Future    Shortness of breath  -     albuterol 108 (90 Base) MCG/ACT inhaler; Inhale 2 puffs by mouth every 6 hours as needed for Wheezing.    Screened negative for alcohol use    Screened negative for drug use      [facility]   -Flu shot: refuses  -Tetanus vaccine: did not address  -Pneumonia vaccine:  Did not address  -Pap smear: patient s/p total hysterectomy  -Mammogram: did not address this visit  -colon cancer screening:  Not addressed this visit  -DEXA: refuses 2/2 weight and bed bound limitations    Follow-up in 6 month(s).  Patient Instruction: See Patient Education/Instruction section.   Patient was educated on clinical, laboratory and imaging findings.   Barriers to Learning assessed: none. All of the patients questions were answered to patient's satisfaction.  Patient verbalizes understanding of teaching and instructions and is agreeable to above plan.    Patient seen and discussed with Dr. Ronalee Red, who agrees with plan above.    Suzana Sohail Rickey Primus, M.D.  Internal Medicine, PGY-2  [place] [facility] [place], [place]

## 2017-10-27 NOTE — Patient Instructions (Addendum)
It was a pleasure meeting with you today. My name is Dr. Nat Math, and you can contact me if you have any questions. The clinic number is [phone].      1) Please schedule your referrals as discussed today    2)  Please take gabapentin 300 mg three times a day    3) Please go to the lab as discussed today      *Please have your labs done on the 4th floor.  The [place] [address] lab is open:  - Monday through Friday from 7:30am to 4:30pm    Hospital Lab:  Sundays 730am - 10:30am

## 2017-10-29 ENCOUNTER — Other Ambulatory Visit: Payer: Self-pay | Admitting: Internal Medicine

## 2017-10-29 ENCOUNTER — Telehealth (INDEPENDENT_AMBULATORY_CARE_PROVIDER_SITE_OTHER): Payer: Self-pay | Admitting: Gastroenterology

## 2017-10-29 ENCOUNTER — Encounter (INDEPENDENT_AMBULATORY_CARE_PROVIDER_SITE_OTHER): Payer: Self-pay | Admitting: Gastroenterology

## 2017-10-29 DIAGNOSIS — E785 Hyperlipidemia, unspecified: Secondary | ICD-10-CM

## 2017-10-29 DIAGNOSIS — K8050 Calculus of bile duct without cholangitis or cholecystitis without obstruction: Principal | ICD-10-CM

## 2017-10-29 NOTE — Telephone Encounter (Signed)
REFILL 

## 2017-10-29 NOTE — Telephone Encounter (Signed)
Patient was hosptialized for sepsis/cholangitis in Oct, 2018. Underwent ERCP at that time. Was told to have repeat ERCP in 8 weeks but failed to follow-up.    Routed to Dr. Rozelle Logan for review

## 2017-10-29 NOTE — Telephone Encounter (Signed)
Thanks [employer]. Scheduling team can we please call patient to schedule ERCP with anesthesia sometime in the next 4 weeks? [place], 30min. Thanks

## 2017-10-30 ENCOUNTER — Telehealth (INDEPENDENT_AMBULATORY_CARE_PROVIDER_SITE_OTHER): Payer: Self-pay | Admitting: Gastroenterology

## 2017-10-30 DIAGNOSIS — K8050 Calculus of bile duct without cholangitis or cholecystitis without obstruction: Secondary | ICD-10-CM

## 2017-10-30 NOTE — Telephone Encounter (Signed)
Spoke with patient to assist with scheduling for repeat procedure per doctor's recommendations. Scheduled for 02.08.2019- [facility]. Patient confirmed procedure date/time. Patient has verified mailing address and e-mail.

## 2017-11-04 ENCOUNTER — Telehealth (HOSPITAL_BASED_OUTPATIENT_CLINIC_OR_DEPARTMENT_OTHER): Payer: Self-pay

## 2017-11-04 NOTE — Telephone Encounter (Signed)
Pt's caregiver Angelita Ingles is calling on her behalf, Pt has been referred for "Open wound of genital labia", Pt will be arriving on a gurney & will need a lift team, Angelita Ingles states that she will be coming in on 2/8 for a Gastro appt & is inquiring if the exam can be done at the same time as that appt, please call Angelita Ingles at [phone] to schedule appt, thank you!

## 2017-11-05 ENCOUNTER — Encounter (HOSPITAL_BASED_OUTPATIENT_CLINIC_OR_DEPARTMENT_OTHER): Payer: Self-pay

## 2017-11-05 NOTE — Telephone Encounter (Signed)
Spoke to patient's caregiver.  Address and birthday of patient verified with caregiver.  Informed caregiver regarding resources and lift supplies must be provided by caregiver because of lack of lift team availability in clinic.  Caregiver verbalizes understanding.  Offered GYN appointment for Monday 11/16/2017 @1100 .  Accepted by caregiver.  Caregiver asking if clinic directions can be emailed to her [email or web address].

## 2017-11-10 ENCOUNTER — Other Ambulatory Visit: Payer: Self-pay | Admitting: Internal Medicine

## 2017-11-10 NOTE — Telephone Encounter (Signed)
Rx(s) sent to pharmacy electronically.  

## 2017-11-11 ENCOUNTER — Other Ambulatory Visit: Payer: Self-pay

## 2017-11-12 ENCOUNTER — Ambulatory Visit: Payer: Medicare Other | Admitting: Cardiovascular Disease

## 2017-11-16 ENCOUNTER — Other Ambulatory Visit: Payer: Self-pay | Admitting: Internal Medicine

## 2017-11-16 ENCOUNTER — Ambulatory Visit (HOSPITAL_BASED_OUTPATIENT_CLINIC_OR_DEPARTMENT_OTHER): Payer: 59 | Admitting: Gynecologic Oncology

## 2017-11-16 ENCOUNTER — Telehealth (HOSPITAL_BASED_OUTPATIENT_CLINIC_OR_DEPARTMENT_OTHER): Payer: Self-pay

## 2017-11-16 NOTE — Telephone Encounter (Signed)
Generic message left on voicemail for pt to call clinic to discuss appt.    If pt calls back she can be rescheduled with Dr Luz Lex on 3/4 at 3:45.

## 2017-11-16 NOTE — Telephone Encounter (Signed)
Pt called to reschedule her appointment for today. Pt will coming in on a Gur[place]. Pt requesting a call back to reschedule. Please advise, than you.    [phone]

## 2017-11-16 NOTE — Telephone Encounter (Signed)
Rx request sent to pharmacy.  

## 2017-11-18 ENCOUNTER — Other Ambulatory Visit: Payer: Self-pay

## 2017-11-19 ENCOUNTER — Telehealth (INDEPENDENT_AMBULATORY_CARE_PROVIDER_SITE_OTHER): Payer: Self-pay | Admitting: Gastroenterology

## 2017-11-19 NOTE — Telephone Encounter (Signed)
Hi Keisha,  Yes that    Should be fine. Mandie, can you help make sure we have the right help from Lift team if needed for tomorrow?  Thanks  Redmond Pulling

## 2017-11-19 NOTE — Telephone Encounter (Addendum)
Spoke with Angelita Ingles, advised me that she just wanted [place] to be aware in order to prepare for patient's arrival tomorrow, and that we will be able to transfer patient from her wheelchair to the bed. Advised her that the doctor and the charge nurse will be notified.              Spoke with Darnelle Maffucci in [facility], advised me that the charge nurse is currently unavailable, he is covering. He has documented this information for tomorrow and also notified the manager, Leafy Ro. Per Darnelle Maffucci will need the lift team tomorrow, and they will page the life team once the patient arrives.

## 2017-11-19 NOTE — Telephone Encounter (Signed)
Hi Dr. 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 Patient insurance denied gurney transportation to GI procedure tomorrow. Patient will arrive in her wheelchair and Angelita Ingles would like to confirm that patient / hospital staff will have the proper assistance to transfer/ hoist pt from wheelchair to bed.

## 2017-11-19 NOTE — Telephone Encounter (Signed)
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 Patient insurance denied gurney transportation to GI procedure tomorrow. Patient will arrive in her wheelchair and Emily Ball would like to confirm that patient / hospital staff will have the proper assistance to transfer/ hoist pt from wheelchair to bed. Please return Emily Ball's call. Thank you

## 2017-11-20 ENCOUNTER — Encounter (HOSPITAL_COMMUNITY): Admission: RE | Disposition: A | Discharge status: Home Health | Payer: Self-pay | Attending: Gastroenterology

## 2017-11-20 ENCOUNTER — Encounter (INDEPENDENT_AMBULATORY_CARE_PROVIDER_SITE_OTHER): Payer: Self-pay | Admitting: Gastroenterology

## 2017-11-20 ENCOUNTER — Ambulatory Visit
Admission: RE | Admit: 2017-11-20 | Discharge: 2017-11-20 | Disposition: A | Discharge status: Home / Self Care | Payer: 59 | Attending: Gastroenterology | Admitting: Gastroenterology

## 2017-11-20 ENCOUNTER — Ambulatory Visit (HOSPITAL_BASED_OUTPATIENT_CLINIC_OR_DEPARTMENT_OTHER): Payer: 59

## 2017-11-20 ENCOUNTER — Ambulatory Visit (HOSPITAL_BASED_OUTPATIENT_CLINIC_OR_DEPARTMENT_OTHER): Payer: 59 | Admitting: Anesthesiology

## 2017-11-20 ENCOUNTER — Encounter (HOSPITAL_BASED_OUTPATIENT_CLINIC_OR_DEPARTMENT_OTHER): Payer: Self-pay

## 2017-11-20 DIAGNOSIS — K8050 Calculus of bile duct without cholangitis or cholecystitis without obstruction: Secondary | ICD-10-CM

## 2017-11-20 DIAGNOSIS — Z6841 Body Mass Index (BMI) 40.0 and over, adult: Secondary | ICD-10-CM | POA: Insufficient documentation

## 2017-11-20 DIAGNOSIS — K8020 Calculus of gallbladder without cholecystitis without obstruction: Secondary | ICD-10-CM

## 2017-11-20 DIAGNOSIS — Z7982 Long term (current) use of aspirin: Secondary | ICD-10-CM | POA: Insufficient documentation

## 2017-11-20 DIAGNOSIS — Z886 Allergy status to analgesic agent status: Secondary | ICD-10-CM | POA: Insufficient documentation

## 2017-11-20 DIAGNOSIS — Z880 Allergy status to penicillin: Secondary | ICD-10-CM | POA: Insufficient documentation

## 2017-11-20 DIAGNOSIS — I10 Essential (primary) hypertension: Secondary | ICD-10-CM | POA: Insufficient documentation

## 2017-11-20 DIAGNOSIS — Z882 Allergy status to sulfonamides status: Secondary | ICD-10-CM | POA: Insufficient documentation

## 2017-11-20 DIAGNOSIS — Z9689 Presence of other specified functional implants: Secondary | ICD-10-CM

## 2017-11-20 DIAGNOSIS — K219 Gastro-esophageal reflux disease without esophagitis: Secondary | ICD-10-CM | POA: Insufficient documentation

## 2017-11-20 DIAGNOSIS — Z79899 Other long term (current) drug therapy: Secondary | ICD-10-CM | POA: Insufficient documentation

## 2017-11-20 SURGERY — ENDOSCOPIC RETROGRADE CHOLANGIOPANCREATOGRAPHY (ERCP)
Anesthesia: General

## 2017-11-20 MED ORDER — LACTATED RINGERS IV SOLN
INTRAVENOUS | Status: DC
Start: 2017-11-20 — End: 2017-11-20

## 2017-11-20 MED ORDER — CIPROFLOXACIN HCL 500 MG OR TABS
500.00 mg | ORAL_TABLET | Freq: Two times a day (BID) | ORAL | 0 refills | Status: AC
Start: 2017-11-20 — End: 2017-11-23

## 2017-11-20 MED ORDER — IOHEXOL 240 MG/ML IJ SOLN
INTRAMUSCULAR | Status: DC | PRN
Start: 2017-11-20 — End: 2017-11-20
  Administered 2017-11-20: 50 mL via ORAL

## 2017-11-20 MED ORDER — INDOMETHACIN 50 MG RE SUPP
100.0000 mg | Freq: Once | RECTAL | Status: DC
Start: 2017-11-20 — End: 2017-11-20
  Filled 2017-11-20: qty 2

## 2017-11-20 MED ORDER — DEXAMETHASONE SODIUM PHOSPHATE 4 MG/ML IJ SOLN (CUSTOM)
INTRAMUSCULAR | Status: DC | PRN
Start: 2017-11-20 — End: 2017-11-20
  Administered 2017-11-20: 8 mg via INTRAVENOUS

## 2017-11-20 MED ORDER — NALOXONE HCL 0.4 MG/ML IJ SOLN
0.1000 mg | INTRAMUSCULAR | Status: DC | PRN
Start: 2017-11-20 — End: 2017-11-20

## 2017-11-20 MED ORDER — PHENYLEPHRINE DILUTION 100 MCG/ML IJ SOLN
INTRAVENOUS | Status: DC | PRN
Start: 2017-11-20 — End: 2017-11-20
  Administered 2017-11-20 (×2): 200 ug via INTRAVENOUS

## 2017-11-20 MED ORDER — LIDOCAINE HCL (CARDIAC) 20 MG/ML IV SOLN
INTRAVENOUS | Status: DC | PRN
Start: 2017-11-20 — End: 2017-11-20
  Administered 2017-11-20: 100 mg via INTRAVENOUS

## 2017-11-20 MED ORDER — DIPHENHYDRAMINE HCL 50 MG/ML IJ SOLN
12.5000 mg | Freq: Once | INTRAMUSCULAR | Status: DC | PRN
Start: 2017-11-20 — End: 2017-11-20

## 2017-11-20 MED ORDER — CIPROFLOXACIN HCL 500 MG OR TABS
500.0000 mg | ORAL_TABLET | Freq: Two times a day (BID) | ORAL | 0 refills | Status: DC
Start: 2017-11-20 — End: 2017-11-20

## 2017-11-20 MED ORDER — ROCURONIUM BROMIDE 100 MG/10ML IV SOLN
INTRAVENOUS | Status: DC | PRN
Start: 2017-11-20 — End: 2017-11-20
  Administered 2017-11-20 (×2): 100 mg via INTRAVENOUS

## 2017-11-20 MED ORDER — HYDROMORPHONE HCL 1 MG/ML IJ SOLN
0.5000 mg | INTRAMUSCULAR | Status: DC | PRN
Start: 2017-11-20 — End: 2017-11-20

## 2017-11-20 MED ORDER — SUGAMMADEX SODIUM 200 MG/2ML IV SOLN
INTRAVENOUS | Status: DC | PRN
Start: 2017-11-20 — End: 2017-11-20
  Administered 2017-11-20: 500 mg via INTRAVENOUS

## 2017-11-20 MED ORDER — FENTANYL CITRATE (PF) 100 MCG/2ML IJ SOLN
50.0000 ug | INTRAMUSCULAR | Status: DC | PRN
Start: 2017-11-20 — End: 2017-11-20

## 2017-11-20 MED ORDER — PROPOFOL IV BOLUS 10 MG/ML
INTRAVENOUS | Status: DC | PRN
Start: 2017-11-20 — End: 2017-11-20
  Administered 2017-11-20: 200 mg via INTRAVENOUS

## 2017-11-20 MED ORDER — CIPROFLOXACIN IN D5W 400 MG/200ML IV SOLN
INTRAVENOUS | Status: DC | PRN
Start: 2017-11-20 — End: 2017-11-20
  Administered 2017-11-20: 400 mg via INTRAVENOUS

## 2017-11-20 MED ORDER — FENTANYL CITRATE (PF) 250 MCG/5ML IJ SOLN
INTRAMUSCULAR | Status: DC | PRN
Start: 2017-11-20 — End: 2017-11-20
  Administered 2017-11-20 (×2): 50 ug via INTRAVENOUS

## 2017-11-20 MED ORDER — LACTATED RINGERS IV SOLN
INTRAVENOUS | Status: DC | PRN
Start: 2017-11-20 — End: 2017-11-20

## 2017-11-20 MED ORDER — FAMOTIDINE 20 MG/2ML IV SOLN
INTRAVENOUS | Status: DC | PRN
Start: 2017-11-20 — End: 2017-11-20
  Administered 2017-11-20: 20 mg via INTRAVENOUS

## 2017-11-20 MED ORDER — ONDANSETRON HCL 4 MG/2ML IV SOLN
4.0000 mg | Freq: Once | INTRAMUSCULAR | Status: DC | PRN
Start: 2017-11-20 — End: 2017-11-20

## 2017-11-20 MED ORDER — ONDANSETRON HCL 4 MG/2ML IV SOLN
INTRAMUSCULAR | Status: DC | PRN
Start: 2017-11-20 — End: 2017-11-20
  Administered 2017-11-20 (×2): 4 mg via INTRAVENOUS

## 2017-11-20 MED ORDER — FENTANYL CITRATE (PF) 100 MCG/2ML IJ SOLN
25.0000 ug | INTRAMUSCULAR | Status: DC | PRN
Start: 2017-11-20 — End: 2017-11-20

## 2017-11-20 SURGICAL SUPPLY — 10 items
BALLOON MULTI 3V EXTRACTION (Misc Medical Supply) ×2 IMPLANT
CATHETER BALLOON CRE DILATION 8-10 MM (Procedural wires/sheaths/catheters/balloons/dilators) ×2
INTRODUCER STENT 10FR (Misc Medical Supply) ×2 IMPLANT
INTRODUCER STENT 7FR (Misc Medical Supply) ×2
RAPTOR GRASPING DEVICE (Misc Medical Supply) ×2
SNARE 10MM STIFF M00561222 (Misc Medical Supply) ×2 IMPLANT
STENT COTTON LEUNG 10FR-7CM (Stent) ×2 IMPLANT
STENT COTTON LEUNG 7FR-8CM (Stent) ×2 IMPLANT
SYRINGE INFLATION CRE STERIFLATE (Procedural wires/sheaths/catheters/balloons/dilators) ×2
WIRE JAGWIRE STRAIGHT TIP .025 X 450CM (Misc Medical Supply) ×2 IMPLANT

## 2017-11-20 NOTE — Procedures (Signed)
[place]  Gastroenterology/Special Procedures     Patient Name: Emily Ball   Date of Birth: October 04, 1951   Record Number: [id number]   Date of Procedure: 11/20/2017   Referring Physician: [facility]   Endoscopist: Nicola Police  Asst.  Endoscopist: None     PROCEDURE PERFORMED  ERCP - biliary stent placement x2, biliary dilation, stone removal, X   ray interpretation of bile duct imaging, EUS esophagus, stomach, and   duodenum, EGD    INDICATIONS FOR EXAMINATION   78F with morbid obesity, CAD, CVA with hemiplegia, Jehovah's Witness,   who was admitted to [facility] with abdominal   pain, elevated LFTs, and CT, MRCP consistent with cholecystitis and   choledocholithiasis. Surgery evaluated the patient as an inpatient   and did not feel she was a surgical candidate due to comorbidities   unless she were to become symptomatic again. She is here for repeat   ERCP.     Instruments:  [phone]  Medications: General Anesthesia, Ciprofloxacin 400mg  IV, 2L LR  The attending physician, Dr. Rozelle Logan, was present for the entire   examination.   Procedure Technique: A physical exam was performed. Informed consent   was obtained from the patient after explaining all the risks   (pancreatitis, cholangitis, perforation, bleeding, infection, missed   lesion(s), tooth damage, and adverse effects to the medicine) ,   benefits and alternatives to the procedure which the patient appeared   to understand and so stated.  The patient was connected to the   monitoring devices. Continuous oxygen was provided with a nasal   cannula and IV medicine administered thru a indwelling cannula. After   adequate conscious sedation was achieved, the patient was intubated   and the scope advanced under direct visualization to the extent of   exam. The scope was subsequently removed slowly while carefully   examining the color, texture, anatomy, and integrity of the mucosa on   the way out. The patient was subsequently  transferred to the recovery   area in satisfactory condition.   Extent of Exam: second part of duodenum.   Biopsy Obtained: No.   Complications: None.  Estimated Blood Loss: minimal.  Need for   Future Anesthesia: yes.     FINDINGS  EGD: GIF-h190 with cap.Normal esophagus, stomach, and duodenum. no   retained food.  EUS: Radial scope. Normal EUS of the esophagus, stomach, and duodenum   without  masses. normal left liver lobe. CBD measures 71mm with a   hyperechoic biliary stent. No pancreas head mass or cysts.   Hyperechoic/fatty pancreatic body. PD in body is 48mm without stones   or ectasia. No ascites or enlarged lymph nodes.  ERCP: TJF scope. Biliary cannulation easily obtained alongside   previous biliary stent on first attempt (no prior sphincterotomy   given she is a Jehovah's Witness to reduce bleeding complications and   need for repeat procedures). Stent removed through the scope with a   snare. Cholangiogram shows a 21mm distal CBD, cystic duct with 4   stones as well as another 3 stones up to 3mm in size in the distal   bile duct and non-dilated intrahepatic ducts. Numerous stones/filling   defects seen in the gallbladder. The CBD stones could not be   extracted with a balloon tipped catheter given size. CRE dilation   performed on the distal bile duct at 8 and 38mm. Balloon sweeps then   able to extract multiple  black  stones from the distal bile duct.   Given high likelihood that she will contine to pass stones from her   cystic duct and gallbladder two plastic biliary stents were placed to   ensure continued biliary drainage and reduce risk of biliary   complications (95ZX x 7cm and 7Fr x 8cm straight stents). Dr. Nicola Police supervised and interpreted radiologic bile duct imaging.     ENDOSCOPIC DIAGNOSIS  1) EUS demonstrates numerous gallstones and ERCP shows multiple   stones within the cystic duct and common bile duct  2) ERCP with biliary dilation, removal of multiple stones and   placement of 2  plastic biliary stents to protect against future   stones which are likely to pass from the gallbladder/cystic duct into   the common bile duct     RECOMMENDATIONS  -clear liquid diet for today  -ciprofloxacin (antibiotic) for 3 more days to reduce risk of   infeciton - first dose is tomorrow morning - this was sent to [place].  -if patient develops signs of biliary obstruction or biliary colic   then would have patient be considered for cholecystectomy and   evaluate for possible repeat ERCP as well  -there is no plan for repeat ERCP if she remains asymptomatic and   stents are in place as she is likely to continue passing more stones   from the cystic duct and gallbladder    Signature:_________________________________ Nicola Police, M.D. [id number])    Note:  The final official report is in the [facility]   medical record.     This procedure was electronically signed off on: 11/20/2017 2:31:16 PM   by Nicola Police,  M.D.

## 2017-11-20 NOTE — Anesthesia Preprocedure Evaluation (Addendum)
ANESTHESIA PRE-OPERATIVE EVALUATION    Patient Information    Name: Emily Ball    MRN: [id number]    DOB: Jul 16, 1951    Age: 67 year old    Sex: female  Procedure(s):  GI ERCP      Pre-op Vitals:   There were no vitals taken for this visit.        Primary language spoken:  English    ROS/Medical History:       History of Present Illness:  67 year old female with PMH significant for metastatic uterine cancer s/p TAH/BSO 09/13/2007, completed radiation 12/2007, hypertension, peripheral neuropathy, morbid obesity, bed bound due to L hemiplegia and morbid obesity. Patient presents with abdominal pain and found to have acute cholecystitis.    General:  positive for Obesity (bmi 43.5),   Cardiovascular:  hypertension,     Anesthesia History:  history of difficult intubation,  history of difficult IV access,  Pt is a grade 1 view with glide scope but requires a small ETT 5.5 for previous history of trach.  Pt still has an open stoma that never healed. Pulmonary:   negative pulmonary ROS     Neuro/Psych:   TIA/CVA,  Bed bound due to L hemiplegia, late effect of stroke and morbid obesity Hematology/Oncology:   anemia,      GI/Hepatic:  GERD,  liver disease,   Hepatomegaly Infectious Disease:  negative for infectious disease     Renal:  negative renal ROS   Endocrine/Other:  history of thyroid disease,     Pregnancy History:   Pediatrics:         Pre Anesthesia Testing (PCC/CPC) notes/comments:                 Physical Exam    Airway:  Inter-inciser distance > 4 cm  Prognanth Able    Mallampati: II  Neck ROM: full  TM distance: 4-5 cm  Short thick neck: Yes        Cardiovascular:    Rhythm: regular   Rate: normal         Pulmonary:  - pulmonary exam normal           Neuro/Neck/Skeletal/Skin:      Dental:        Abdominal:      General: morbid obesity     Additional Clinical Notes:               Last  OSA (STOP BANG) Score:  No Data Recorded    Last OSA  (STOP) Score for   No Data Recorded                 Past Medical  History:   Diagnosis Date    Cholangitis     CVA (cerebral vascular accident) (CMS-HCC)     Hypertension     Left-sided muscle weakness     Obesity     Stroke (CMS-HCC)     Uterine cancer (CMS-HCC)      Past Surgical History:   Procedure Laterality Date    HYSTERECTOMY      THYROIDECTOMY       Social History     Tobacco Use    Smoking status: Never Smoker    Smokeless tobacco: Never Used   Substance Use Topics    Alcohol use: No     Frequency: Never    Drug use: No       Current Outpatient Medications   Medication Sig Dispense Refill  albuterol 108 (90 Base) MCG/ACT inhaler Inhale 2 puffs by mouth every 6 hours as needed for Wheezing. 1 Inhaler 3    aspirin 81 MG chewable tablet Take 81 mg by mouth daily.      fluticasone propionate (FLONASE) 50 MCG/ACT nasal spray Spray 2 sprays into each nostril daily.      gabapentin (NEURONTIN) 100 MG capsule Take 3 capsules (300 mg) by mouth 3 times daily. 270 capsule 3    HYDROcodone-acetaminophen (NORCO) 5-325 MG tablet Take 1 tablet by mouth every 6 hours as needed for Moderate Pain (Pain Score 4-6). 60 tablet 0    hydrocortisone 1 % ointment Apply 1 Application topically 4 times daily.      lansoprazole (PREVACID) 30 MG capsule Take 1 capsule (30 mg) by mouth 2 times daily. 30 capsule 3    levothyroxine (SYNTHROID) 125 MCG tablet Take 1 tablet (125 mcg) by mouth every morning (before breakfast). 30 tablet 0    LORazepam (ATIVAN) 0.5 MG tablet Take 0.5 mg by mouth 2 times daily as needed.      magnesium citrate (CVS CITRATE OF MAGNESIA) 1.745 GM/30ML SOLN Take 1 bottle by mouth daily as needed for Constipation.      nitroGLYcerin (NITROSTAT) 0.4 MG SL tablet 0.5 mg by Sublingual route every 5 minutes as needed.      nystatin (NYSTOP) 100000 UNIT/GM powder Apply 1 Application topically 2 times daily. 60 g 3    nystatin (NYSTOP) 100000 UNIT/GM powder Apply 1 Application topically 2 times daily.      opioid agreement opioid agreement 1 each 0     polyethylene glycol (GLYCOLAX) powder Take 17 g by mouth daily. 255 g 0    senna (SENOKOT) 8.6 MG tablet Take 1 tablet (8.6 mg) by mouth daily. 30 tablet 3    sertraline (ZOLOFT) 25 MG tablet Take 25 mg by mouth 2 times daily.      vitamin B-12 (CYANOCOBALAMIN) 1000 MCG tablet Take 1,000 mcg by mouth daily.       No current facility-administered medications for this visit.      Allergies   Allergen Reactions    Pcn [Penicillins] Anaphylaxis    Strawberry Rash     Fresh strawberries per patient.     Sulfa Drugs Rash    Aspirin-Dipyridamole Unspecified     pruritus    Nitrous Oxide Other     Gets paranoid from, given at dentist before    Trazodone Other     Nightmares    Zolpidem Unspecified     Doesn't help sleep and gets anxious       Labs and Other Data  Lab Results   Component Value Date    NA 140 07/28/2017    K 3.3 (L) 07/28/2017    CL 101 07/28/2017    BICARB 30 (H) 07/28/2017    BUN 12 07/28/2017    CREAT 0.66 07/28/2017    GLU 86 07/28/2017    [place] 9.1 07/28/2017     Lab Results   Component Value Date    AST 178 (H) 07/28/2017    ALT 166 (H) 07/28/2017    ALK 276 (H) 07/28/2017    TP 8.1 (H) 07/28/2017    ALB 3.3 (L) 07/28/2017    TBILI 0.90 07/28/2017     Lab Results   Component Value Date    WBC 5.1 07/28/2017    RBC 4.24 07/28/2017    HGB 13.3 07/28/2017    HCT 41.4 07/28/2017  MCV 97.6 (H) 07/28/2017    MCHC 32.1 07/28/2017    RDW 15.0 (H) 07/28/2017    PLT 216 07/28/2017    MPV 11.4 07/28/2017    SEG 51 07/28/2017    LYMPHS 37 07/28/2017    MONOS 9 07/28/2017    EOS 2 07/28/2017     Lab Results   Component Value Date    INR 1.2 07/26/2017    PTT 33 06/02/2017     No results found for: ARTPH, ARTPO2, ARTPCO2    Anesthesia Plan:  Risks and Benefits of Anesthesia  I personally examined the patient immediately prior to the anesthetic and reviewed the pertinent medical history, drug and allergy history, laboratory and imaging studies and consultations. I have determined that the patient has had  adequate assessment and testing.    Anesthetic techniques, invasive monitors, anesthetic drugs for induction, maintenance and post-operative analgesia, risks and alternatives have been explained to the patient and/or patient's representatives.    I have prescribed the anesthetic plan:         Planned anesthesia method: General         ASA 3 (Severe systemic disease)     Potential anesthesia problems identified and risks including but not limited to the following were discussed with patient and/or patient's representative: Adverse or allergic drug reaction, Dental injury or sore throat, Nerve injury, Injury to brain, heart and other organs and Death    No Beta Blocker Indicated: Patient not on beta blockersPlanned monitoring method: Routine monitoring  Comments: (Discussed geta.  Patient agrees to lift DNR status for OR; is jehovah's witness, will accept saline/IVF only)    Informed Consent:  Anesthetic plan and risks discussed with Patient.  Use of blood products discussed with whom did not consent to blood products.         Plan discussed with CRNA and Attending.

## 2017-11-20 NOTE — Anesthesia Postprocedure Evaluation (Signed)
Anesthesia Transfer of Care Note    Patient: Emily Ball    Procedures performed: Procedure(s):  GI ERCP    Vital signs: stable           Anesthesia Post Note    Patient: [name]    Procedure(s) Performed: Procedure(s):  GI ERCP      Final anesthesia type: General    Patient location: PACU    Post anesthesia pain: adequate analgesia    Mental status: awake, alert  and oriented    Airway Patent: Yes    Last Vitals:   Vitals:    11/20/17 1530   BP: (!) 149/94   Pulse: 73   Resp: 16   Temp: 36.1 C   SpO2: 99%       Post vital signs: stable    Hydration: adequate    N/V:no    Anesthetic complications: no    Plan of care per primary team.

## 2017-11-20 NOTE — H&P (Signed)
History and Physical    Indication for procedure:  choledocholithiasis    Pain Score: 0          Past Medical History:   Diagnosis Date    Cholangitis     CVA (cerebral vascular accident) (CMS-HCC)     Hypertension     Left-sided muscle weakness     Obesity     Stroke (CMS-HCC)     Uterine cancer (CMS-HCC)      Past Surgical History:   Procedure Laterality Date    HYSTERECTOMY      THYROIDECTOMY       Allergies   Allergen Reactions    Pcn [Penicillins] Anaphylaxis    Strawberry Rash     Fresh strawberries per patient.     Sulfa Drugs Rash    Aspirin-Dipyridamole Unspecified     pruritus    Nitrous Oxide Other     Gets paranoid from, given at dentist before    Trazodone Other     Nightmares    Zolpidem Unspecified     Doesn't help sleep and gets anxious     Prior to Admission Medications   Prescriptions Last Dose Informant Patient Reported? Taking?   HYDROcodone-acetaminophen (NORCO) 5-325 MG tablet   No No   Sig: Take 1 tablet by mouth every 6 hours as needed for Moderate Pain (Pain Score 4-6).   LORazepam (ATIVAN) 0.5 MG tablet 11/20/2017 at Unknown time  Yes Yes   Sig: Take 0.5 mg by mouth 2 times daily as needed.   albuterol 108 (90 Base) MCG/ACT inhaler   No No   Sig: Inhale 2 puffs by mouth every 6 hours as needed for Wheezing.   aspirin 81 MG chewable tablet   Yes No   Sig: Take 81 mg by mouth daily.   fluticasone propionate (FLONASE) 50 MCG/ACT nasal spray   Yes No   Sig: Spray 2 sprays into each nostril daily.   gabapentin (NEURONTIN) 100 MG capsule   No No   Sig: Take 3 capsules (300 mg) by mouth 3 times daily.   hydrocortisone 1 % ointment   Yes No   Sig: Apply 1 Application topically 4 times daily.   lansoprazole (PREVACID) 30 MG capsule   No No   Sig: Take 1 capsule (30 mg) by mouth 2 times daily.   levothyroxine (SYNTHROID) 125 MCG tablet 11/20/2017 at Unknown time  No Yes   Sig: Take 1 tablet (125 mcg) by mouth every morning (before breakfast).   magnesium citrate (CVS CITRATE OF MAGNESIA)  1.745 GM/30ML SOLN   Yes No   Sig: Take 1 bottle by mouth daily as needed for Constipation.   nitroGLYcerin (NITROSTAT) 0.4 MG SL tablet   Yes No   Sig: 0.5 mg by Sublingual route every 5 minutes as needed.   nystatin (NYSTOP) 100000 UNIT/GM powder   Yes No   Sig: Apply 1 Application topically 2 times daily.   nystatin (NYSTOP) 100000 UNIT/GM powder   No No   Sig: Apply 1 Application topically 2 times daily.   opioid agreement   No No   Sig: opioid agreement   polyethylene glycol (GLYCOLAX) powder   No No   Sig: Take 17 g by mouth daily.   senna (SENOKOT) 8.6 MG tablet   No No   Sig: Take 1 tablet (8.6 mg) by mouth daily.   sertraline (ZOLOFT) 25 MG tablet 11/20/2017 at Unknown time  Yes Yes   Sig: Take 25 mg by mouth  2 times daily.   vitamin B-12 (CYANOCOBALAMIN) 1000 MCG tablet   Yes No   Sig: Take 1,000 mcg by mouth daily.      Facility-Administered Medications: None       BP (!) 182/108    Pulse 68    Temp 97.1 F (36.2 C)    Resp 16    Ht 5\' 9"  (1.753 m)    Wt 145.2 kg (320 lb)    SpO2 98%    BMI 47.26 kg/m   General: Well developed, well nourished, in no apparent distress.  Lungs: Clear breath sounds bilaterally.  CV: Normal rate, regular rhythm, no significant murmur present.  Abdomen: Soft, nontender, normal bowel sounds present.    ASA Score:  3   Airway (Mallimpati) Score:  Class I - Soft palate, uvula, fauces, and pillars are visible.    Assessment and Plan  Proceed to planned procedure.    The patient has consented to the procedure, which will be done with sedation.  I have assessed the patient's status immediately prior to this procedure.  I have discussed pain management needs and options for the patient with the patient or caregiver.      The patient agrees to be full code for the duration of the procedure.    Sedation options, risks, and plans have been discussed with the patient or caregiver.  Questions were answered.  The patient or caregiver agrees to proceed as planned.    Deeann Cree

## 2017-11-21 ENCOUNTER — Telehealth (INDEPENDENT_AMBULATORY_CARE_PROVIDER_SITE_OTHER): Payer: Self-pay | Admitting: Gastroenterology

## 2017-11-21 NOTE — Telephone Encounter (Signed)
Telephone Encounter Note    [phone] with morbid obesity, CAD, CVA with hemiplegia, Jehovah's Witness, who was admitted to [facility] with abdominal pain, elevated LFTs, and CT, MRCP consistent with cholecystitis and choledocholithiasis s/p ERCP with biliary dilation, removal of multiple stones and placement of 2 plastic biliary stents. Patient's caretaker, Angelita Ingles, was wondering if headaches were related to procedure, which is unlikely. I recommended she take the patient to an urgent care or ER to be evaluated since her headache is unrelieved by tylenol - patient also taking norco and this also does not help her headaches.     Fausto Skillern, MD  Gastroenterology Fellow

## 2017-11-23 ENCOUNTER — Ambulatory Visit (INDEPENDENT_AMBULATORY_CARE_PROVIDER_SITE_OTHER): Payer: 59 | Admitting: Head and Neck Surgery

## 2017-12-01 ENCOUNTER — Encounter: Payer: Self-pay | Admitting: Internal Medicine

## 2017-12-07 ENCOUNTER — Other Ambulatory Visit: Payer: Self-pay

## 2018-01-01 ENCOUNTER — Telehealth (INDEPENDENT_AMBULATORY_CARE_PROVIDER_SITE_OTHER): Payer: Self-pay | Admitting: Gastroenterology

## 2018-01-01 NOTE — Telephone Encounter (Signed)
[facility] Episode Summary Report 12/21/2017 for Dr. Ardelle Anton review.

## 2018-01-12 ENCOUNTER — Encounter: Payer: Self-pay | Admitting: Cardiovascular Disease

## 2018-01-12 ENCOUNTER — Ambulatory Visit: Payer: Medicare Other | Admitting: Cardiovascular Disease

## 2018-01-12 VITALS — BP 136/78 | HR 99 | Ht 62.5 in | Wt 158.0 lb

## 2018-01-12 DIAGNOSIS — Z79899 Other long term (current) drug therapy: Secondary | ICD-10-CM | POA: Diagnosis not present

## 2018-01-12 DIAGNOSIS — E781 Pure hyperglyceridemia: Secondary | ICD-10-CM

## 2018-01-12 DIAGNOSIS — R0602 Shortness of breath: Secondary | ICD-10-CM

## 2018-01-12 LAB — LIPID PANEL
Chol/HDL Ratio: 9.8 ratio — ABNORMAL HIGH (ref 0.0–4.4)
Cholesterol, Total: 127 mg/dL (ref 100–199)
HDL: 13 mg/dL — ABNORMAL LOW (ref >39)
LDL Calculated: 45 mg/dL (ref 0–99)
Triglycerides: 344 mg/dL — ABNORMAL HIGH (ref 0–149)
VLDL Cholesterol Cal: 69 mg/dL — ABNORMAL HIGH (ref 5–40)

## 2018-01-12 LAB — COMPREHENSIVE METABOLIC PANEL
ALT: 26 IU/L (ref 0–32)
AST: 43 IU/L — ABNORMAL HIGH (ref 0–40)
Albumin/Globulin Ratio: 1.9 (ref 1.2–2.2)
Albumin: 4.3 g/dL (ref 3.6–4.8)
Alkaline Phosphatase: 62 IU/L (ref 39–117)
BUN/Creatinine Ratio: 18 (ref 12–28)
BUN: 22 mg/dL (ref 8–27)
Bilirubin Total: 0.7 mg/dL (ref 0.0–1.2)
CO2: 21 mmol/L (ref 20–29)
Calcium: 10.1 mg/dL (ref 8.7–10.3)
Chloride: 105 mmol/L (ref 96–106)
Creatinine, Ser: 1.2 mg/dL — ABNORMAL HIGH (ref 0.57–1.00)
GFR calc Af Amer: 53 mL/min/{1.73_m2} — ABNORMAL LOW (ref >59)
GFR calc non Af Amer: 46 mL/min/{1.73_m2} — ABNORMAL LOW (ref >59)
Globulin, Total: 2.3 g/dL (ref 1.5–4.5)
Glucose: 84 mg/dL (ref 65–99)
Potassium: 3.7 mmol/L (ref 3.5–5.2)
Sodium: 142 mmol/L (ref 134–144)
Total Protein: 6.6 g/dL (ref 6.0–8.5)

## 2018-01-12 NOTE — Progress Notes (Signed)
Cardiology Office Note   Date:  01/12/2018   ID:  Morgan Bell, DOB 04-22-1951, MRN [id number]  PCP:  Morgan Fenton, NP  Cardiologist:   Morgan Latch, MD   No chief complaint on file.    History of Present Illness: Morgan Bell is a 67 y.o. female with hypertension, hyperlipiemia, RA, prior stroke (age 16, no residual deficits) and obesity who presents for follow up.  She was previously a patient of Dr. Debara Bell.  She last saw Dr. Debara Bell 10/2016 and reported mild exertional dyspnea that was overall stable. She also noted mild lower extremity edema that was attributed to amlodipine.  She was noted to have severely elevated triglycerides and was started on fenofibrate with an improvement from 1118 to 384.  Her blood pressure was poorly-controlled.  Enalapril was switched to irbesartan.  She was also started on rosuvastatin given her hypertriglyceridemia and Rheumatoid arthritis.  She had repeat labs 01/2017 that showed her triglycerides had increased from 384 to 586.  She reports that she takes all her medications as prescribed.    [name] notes that she has been feeling very tired lately.  She gets short of breath with exertion but denies chest pain.  If she goes shopping 1 day then she is exhausted the following day.  She has not noted any lower extremity edema, orthopnea, or PND.  At her last appointment she was instructed not to exercise,  presumably due to her severely elevated triglycerides.  She thinks that her  fatigue and shortness of breath are attributable to lack of exercise.She checks her blood pressure at home and it has been consistently less than 130/80.  She does not drink alcohol.    At her last appointment [name] wanted to work on diet and exercise to see if her shortness of breath improved.  She is afraid of undergoing stress testing.  She had follow-up lipids with her PCP and triglycerides were 490 on 08/2017.  Since that appointment she started walking several  times per day.  She has no chest pain and her breathing has improved significantly.  She is no longer short of breath.  She is lost 9 pounds since November.  She has no lower extremity edema, orthopnea, or PND.  She has also been working on her diet and is limiting carbohydrates including bread.    Past Medical History:  Diagnosis Date  . Arthritis   . Headache(784.0)    HX MIGRAINES   . Hypertension   . Melanoma ([place])   . Obesity   . Stroke [facility])    MINI STROKE     21 YRS AGO     Past Surgical History:  Procedure Laterality Date  . CESAREAN SECTION  11/26/1973  . CESAREAN SECTION  09/22/1975  . CESAREAN SECTION  04/30/1978  . HEMORRHOID SURGERY  06/1987  . MELANOMA EXCISION WITH SENTINEL LYMPH NODE BIOPSY Right 03/02/2013   Procedure: Wide MELANOMA EXCISION Right Abdominal wall WITH SENTINEL LYMPH NODE mapping Right Axilla;  Surgeon: Morgan Roof, MD;  Location: [place];  Service: General;  Laterality: Right;  . NM [place]  2003   negative Bruce protocol exercise stress test with no evidence of perfusion abnormality, EF 66%  . TOTAL HIP ARTHROPLASTY Right 03/2011     Current Outpatient Medications  Medication Sig Dispense Refill  . amLODipine (NORVASC) 10 MG tablet TAKE 1/2 TABLET BY MOUTH TWICE DAILY 30 tablet 10  . aspirin EC 81  MG tablet Take 81 mg daily by mouth.    . Cholecalciferol (VITAMIN D) 2000 units CAPS Take 1 capsule daily by mouth.    . fenofibrate (TRICOR) 145 MG tablet Take 1 tablet (145 mg total) daily by mouth. 30 tablet 5  . Ferrous Sulfate (IRON SLOW RELEASE) 143 (45 Fe) MG TBCR Take 1 tablet daily by mouth.    . irbesartan (AVAPRO) 300 MG tablet TAKE 1 TABLET (300 MG TOTAL) BY MOUTH DAILY. 30 tablet 8  . leflunomide (ARAVA) 20 MG tablet Take 20 mg daily by mouth.    . loratadine (CLARITIN) 10 MG tablet Take 10 mg by mouth daily.    . meloxicam (MOBIC) 15 MG tablet Take 15 mg by mouth daily.    . Misc Natural Products (TURMERIC CURCUMIN)  CAPS Take 1 capsule by mouth daily.    . Multiple Vitamin (MULTIVITAMIN WITH MINERALS) TABS Take 1 tablet by mouth daily.    . OMEGA-3 KRILL OIL PO Take 1 capsule by mouth daily.    . potassium chloride SA (K-DUR,KLOR-CON) 20 MEQ tablet TAKE 1 TABLET BY MOUTH TWICE A DAY 60 tablet 4  . rosuvastatin (CRESTOR) 20 MG tablet Take 1 tablet (20 mg total) by mouth daily. 90 tablet 1  . amoxicillin (AMOXIL) 500 MG tablet Take 500 mg 2 (two) times daily by mouth. Dental work     No current facility-administered medications for this visit.     Allergies:   Sulfa antibiotics    Social History:  The patient  reports that she has never smoked. She has never used smokeless tobacco. She reports that she does not drink alcohol or use drugs.   Family History:  The patient's family history includes Alzheimer's disease in her maternal grandmother and mother; Cancer in her maternal grandfather; Heart attack in her paternal grandfather; Heart disease in her father; Tuberculosis in her sister.    ROS:  Please see the history of present illness.   Otherwise, review of systems are positive for none.   All other systems are reviewed and negative.    PHYSICAL EXAM: VS:  BP 136/78   Pulse 99   Ht 5' 2.5" (1.588 m)   Wt 158 lb (71.7 kg)   SpO2 96%   BMI 28.44 kg/m  , BMI Body mass index is 28.44 kg/m. GENERAL:  Well appearing.  No acute distress. HEENT: Pupils equal round and reactive, fundi not visualized, oral mucosa unremarkable NECK:  No jugular venous distention, waveform within normal limits, carotid upstroke brisk and symmetric, no bruits LUNGS:  Clear to auscultation bilaterally.  No crackles, wheezes or rhonchi.  HEART:  RRR.  PMI not displaced or sustained,S1 and S2 within normal limits, no S3, no S4, no clicks, no rubs, no murmurs ABD:  Flat, positive bowel sounds normal in frequency in pitch, no bruits, no rebound, no guarding, no midline pulsatile mass, no hepatomegaly, no splenomegaly MSK:  Kyphosis EXT:  2 plus pulses throughout, no edema, no cyanosis no clubbing SKIN:  No rashes no nodules NEURO:  Cranial nerves II through XII grossly intact, motor grossly intact throughout PSYCH:  Cognitively intact, oriented to person place and time  EKG:  EKG is not ordered today. The ekg ordered today demonstrates sinus rhythm.  Rate 99 bpm.  Poor R wave progression.  LVH with secondary repolarization of normalities.  LAFB   Recent Labs: 08/26/2017: ALT 30; BUN 20; Creatinine, Ser 1.22; Hemoglobin 13.6; Platelets 211.0; Potassium 4.1; Sodium 138    Lipid Panel  Component Value Date/Time   CHOL 152 08/26/2017 1559   TRIG (H) 08/26/2017 1559    490.0 Triglyceride is over 400; calculations on Lipids are invalid.   HDL 14.20 (L) 08/26/2017 1559   CHOLHDL 11 08/26/2017 1559   VLDL 76.8 (H) 10/07/2016 0759   LDLDIRECT 78.0 08/26/2017 1559   01/16/17:  Total cholesterol 228, triglycerides 586, HDL 27, LDL not calculated.    Wt Readings from Last 3 Encounters:  01/12/18 158 lb (71.7 kg)  08/26/17 167 lb (75.8 kg)  08/17/17 165 lb 9.6 oz (75.1 kg)      ASSESSMENT AND PLAN:  # Hypertriglyceridemia:  # Mixed hyperlipidemia:  Ms. Dolinski has been doing well with exercise and diet.  We will recheck her lipids and CMP.  Continue fenofibrate, Krill oil and rosuvastatin.    # Shortness of breath: # Fatigue:  Improved with exercise.  No further testing at this time.   # Hypertension: Blood pressure mildly above goal.  She doesn't want to change her meds.  Continue irbesartan and amlodipine.    Current medicines are reviewed at length with the patient today.  The patient does not have concerns regarding medicines.  The following changes have been made:  no change  Labs/ tests ordered today include:  Orders Placed This Encounter  Procedures  . Lipid panel  . Comprehensive metabolic panel     Disposition:   FU with Tiffany C. Oval Linsey, MD, [facility] in 6 months.        Signed, Tiffany C. Oval Linsey, MD, [facility]  01/12/2018 8:39 AM    [facility]

## 2018-01-12 NOTE — Patient Instructions (Signed)
Medication Instructions:  Your physician recommends that you continue on your current medications as directed. Please refer to the Current Medication list given to you today.  Labwork: LP/CMET TODAY   Testing/Procedures: NONE  Follow-Up: Your physician wants you to follow-up in: 6 MONTH OV You will receive a reminder letter in the mail two months in advance. If you don't receive a letter, please call our office to schedule the follow-up appointment.  If you need a refill on your cardiac medications before your next appointment, please call your pharmacy.  

## 2018-01-15 ENCOUNTER — Other Ambulatory Visit: Payer: Self-pay

## 2018-01-15 DIAGNOSIS — I10 Essential (primary) hypertension: Secondary | ICD-10-CM

## 2018-01-15 DIAGNOSIS — E785 Hyperlipidemia, unspecified: Secondary | ICD-10-CM

## 2018-01-15 MED ORDER — ROSUVASTATIN CALCIUM 40 MG PO TABS: 40.0000 mg | ORAL_TABLET | Freq: Every day | ORAL | 6 refills | Status: DC

## 2018-01-19 DIAGNOSIS — M5136 Other intervertebral disc degeneration, lumbar region: Secondary | ICD-10-CM | POA: Diagnosis not present

## 2018-01-19 DIAGNOSIS — M0589 Other rheumatoid arthritis with rheumatoid factor of multiple sites: Secondary | ICD-10-CM | POA: Diagnosis not present

## 2018-01-19 DIAGNOSIS — Z79899 Other long term (current) drug therapy: Secondary | ICD-10-CM | POA: Diagnosis not present

## 2018-01-19 DIAGNOSIS — M150 Primary generalized (osteo)arthritis: Secondary | ICD-10-CM | POA: Diagnosis not present

## 2018-01-21 ENCOUNTER — Encounter: Payer: Self-pay | Admitting: Internal Medicine

## 2018-01-21 DIAGNOSIS — Z1239 Encounter for other screening for malignant neoplasm of breast: Principal | ICD-10-CM

## 2018-01-26 ENCOUNTER — Encounter (INDEPENDENT_AMBULATORY_CARE_PROVIDER_SITE_OTHER): Payer: Self-pay | Admitting: Internal Medicine

## 2018-01-26 DIAGNOSIS — Z0000 Encounter for general adult medical examination without abnormal findings: Principal | ICD-10-CM

## 2018-02-15 ENCOUNTER — Other Ambulatory Visit: Payer: Self-pay

## 2018-03-09 ENCOUNTER — Other Ambulatory Visit: Payer: Self-pay | Admitting: Internal Medicine

## 2018-03-11 ENCOUNTER — Telehealth (INDEPENDENT_AMBULATORY_CARE_PROVIDER_SITE_OTHER): Payer: Self-pay | Admitting: Internal Medicine

## 2018-03-11 DIAGNOSIS — R52 Pain, unspecified: Secondary | ICD-10-CM

## 2018-03-11 NOTE — Telephone Encounter (Addendum)
I was contacted by [name] regarding two issues she wanted me to address for her.    1) Since she is mostly bed bound and has difficult time with transportation, she was hoping we could send her Norco 5-325 throughout MAIL order rather than having her pick this up. She was referring to a [place] that would allow this to happen. If RN or MA could assist with this I do think this would be an appropriate option for this patient.     She said her [employer] member number was [phone] and the number was [phone]    2) Could we provide this patient with a case manager? Along the lines as above, she needs to be provided with a home health aid and other resources to assist her at home because she is bedbound and has difficult mobilizing outside her home. I have met her once and agree with this assessment.     Dr Louretta Parma    Will await input from RN or MA. [place]

## 2018-03-12 NOTE — Telephone Encounter (Signed)
Spoke with [employer]     Norco may be sent via mail but [employer] would need hard copy RX with signature mailed to them in order to process RX.     Attempted to contact pt to explain. May not be a good option for her as it could take 2 weeks for pharmacy to receive rx and then unsure of turnaround time for pharmacy to fill and mail medication to pt.     LVM on pt line to return call to clinic.     Will route to Amy H, Education officer, museum, for further assistance with CM if able to. Thank you.

## 2018-03-12 NOTE — Telephone Encounter (Signed)
Routing to clinic Biomedical scientist and MA/LVN to assist w/ follow up

## 2018-03-14 MED ORDER — NALOXONE HCL 4 MG/0.1ML NA LIQD
NASAL | 3 refills | Status: DC
Start: 2018-03-14 — End: 2018-03-30

## 2018-03-14 MED ORDER — HYDROCODONE-ACETAMINOPHEN 5-325 MG OR TABS
1.0000 | ORAL_TABLET | Freq: Four times a day (QID) | ORAL | 0 refills | Status: DC | PRN
Start: 2018-03-14 — End: 2018-04-16

## 2018-03-14 NOTE — Telephone Encounter (Signed)
I signed order for Norco in case it can be mailed.    I will continue to follow up on this case.

## 2018-03-15 ENCOUNTER — Other Ambulatory Visit (INDEPENDENT_AMBULATORY_CARE_PROVIDER_SITE_OTHER): Payer: Self-pay

## 2018-03-15 DIAGNOSIS — Z7401 Bed confinement status: Secondary | ICD-10-CM

## 2018-03-15 NOTE — Telephone Encounter (Signed)
Internal Medicine Clinic Social Work Outreach    Reason for [employer]:  Request to evaluate need for community resources as pt is bed bound.  Assessment/Intervention  Call to pt, no answer.  Left message encouraging a return call to discuss needs and resources.  Plan for next Outreach:  Assess needs  Next Tentative Outreach:  With in one week

## 2018-03-15 NOTE — Telephone Encounter (Signed)
Printed script placed In MD box to be signed .

## 2018-03-16 ENCOUNTER — Other Ambulatory Visit: Payer: Self-pay

## 2018-03-17 NOTE — Telephone Encounter (Signed)
Internal Medicine Clinic Social Work Outreach    Reason for [employer]:  2nd attempt to reach pt regarding home bound status and need for resources  Assessment/Intervention  Call to Pt, no answer left msg with encouragement to return call.  Plan for next Outreach:  Will make 3rd and final attempt to connect with pt  Next Tentative Outreach:  One week

## 2018-03-18 ENCOUNTER — Encounter: Payer: Self-pay | Admitting: Internal Medicine

## 2018-03-19 NOTE — Telephone Encounter (Signed)
Internal Medicine Clinic Social Work Outreach    Reason for [employer]:  3rd attempt to reach pt to assess needs as pt is bed bound  Assessment/Intervention  Call to pt, no answer, left detailed msg encouraging pt to return call, elaboration in msg on purpose of call is to assess needs for resources  Plan for next Outreach:  No further outreaches but will remain available as needed  Next Tentative Outreach:  None planned

## 2018-03-29 ENCOUNTER — Telehealth (INDEPENDENT_AMBULATORY_CARE_PROVIDER_SITE_OTHER): Payer: Self-pay | Admitting: Internal Medicine

## 2018-03-29 ENCOUNTER — Other Ambulatory Visit (INDEPENDENT_AMBULATORY_CARE_PROVIDER_SITE_OTHER): Payer: Self-pay | Admitting: Internal Medicine

## 2018-03-29 DIAGNOSIS — F419 Anxiety disorder, unspecified: Secondary | ICD-10-CM

## 2018-03-29 DIAGNOSIS — E039 Hypothyroidism, unspecified: Secondary | ICD-10-CM

## 2018-03-29 MED ORDER — LEVOTHYROXINE SODIUM 125 MCG OR TABS
125.0000 ug | ORAL_TABLET | Freq: Every day | ORAL | 0 refills | Status: DC
Start: 2018-03-29 — End: 2018-04-22

## 2018-03-29 NOTE — Telephone Encounter (Addendum)
Lab Reminder:    Please remind pt to have labs drawn (non-fasting).     Authorized 90 days supply + 0 RF until labs complete.             Controlled substances (ATIVAN - pcp has not ordered) are not currently included in Pharmacy Refill Clinic protocol. Re-routing to appropriate staff for processing. Thank you.  ---------------------------------------------------------------------------    Levothyroxine approved per protocol

## 2018-03-29 NOTE — Telephone Encounter (Signed)
Emily Ball, CPhT  (Rx Refill and PA Clinic)    Pcp has never ordered thyroid. Refill is not needed on SERTRALINE (PCP HAS NEVER ORDERED), but takes 25mg  daily - and just got #90/90 days from Dr. 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 Has had this open stoma for 4-5 years.   A/P: Hypothyroidism, unspecified type: last TSH in Oct, 2018 was 19. Will recheck labs today.  -     TSH, Blood - See Instructions; Future  -     Free Thyroxine, Blood Green Plasma Separator Tube; Future          LABS required:  (Q year TSH)  *If recent dose change: check TSH 2 months after starting new  dose.    Lab Results   Component Value Date    TSH 19.45 (H) 07/28/2017         Monitoring required:  (Q year BP, HR, Wt)   *If pt is pregnant, DEFER TO MD*    (BP range: PPGFQMKJ=03-128  FVWAQLRJP=36-68)  Blood Pressure   11/20/17 (!) 149/94   10/27/17 153/84   07/28/17 155/67       (HR range: 55-110)   Pulse Readings from Last 3 Encounters:   11/20/17 73   10/27/17 64   07/28/17 58       Wt Readings from Last 3 Encounters:   11/20/17 145.2 kg (320 lb)   07/24/17 133.7 kg (294 lb 12.1 oz)   06/02/17 113.4 kg (250 lb)

## 2018-03-29 NOTE — Telephone Encounter (Signed)
Referral Request    Name of PCP Provider: Rickey Primus, [facility] Coverage Verified: Active- in network  Who is requesting the referral? Incoming call from patient   What type of referral is being requested? Case Manager  Reason for request? Patient states she needs extra help with her care  Has this issue been discussed with provider? yes    Last office visit: 10/27/2017   Next office visit: None     Best way to contact patient: [phone]   Alternative communication method: [phone]    Has been advised this message will be transmitted to office and can expect a response within the next 24-72 hours.

## 2018-03-29 NOTE — Telephone Encounter (Signed)
Called pt. To inform hert to have labs drawn (non-fasting).     Authorized 90 days supply + 0 RF until labs complete. No answer and unable to leave message, no other phone number on file. Pt. Does not have MyChart    Refill request fax received from: [place]  Was not prescribed by [place], please refill if appropriate.     Medication requested:   Requested Prescriptions     Pending Prescriptions Disp Refills    LORazepam (ATIVAN) 0.5 MG tablet       Sig: Take 1 tablet (0.5 mg) by mouth 2 times daily as needed.     Signed Prescriptions Disp Refills    levothyroxine (SYNTHROID) 125 MCG tablet 90 tablet 0     Sig: Take 1 tablet (125 mcg) by mouth every morning (before breakfast). NEEDS LABS     Authorizing Provider: Rickey Primus, [facility]IE[facility] Cletus Gash     Ordering User: Stephani Police       Patient is requesting refill of medication, please see orders for preloaded prescription refill.    Last visit with this MD:    10/27/2017  Next appointment with this MD:   03/29/2018    Last office visit:     10/27/2017  Next office visit:     03/29/2018    List of HM items due or overdue:  Health Maintenance Due   Topic Date Due    IMM_TD/TDAP=>67 YO  11/19/1961    BREAST CANCER SCREENING  11/18/1988    Hepatitis C Screening  11/18/2000    Osteoporosis Screening  11/18/2013    IMM pneumococcal 65+ Low/Med Risk PCV13 yr 1 and PPSV23 yr2 (1 of 2 - PCV13) 11/18/2013    Medicare Annual Wellness Visit  06/02/2017    PHQ9 Depression Monitoring doc flowsheet  02/24/2018

## 2018-03-29 NOTE — Telephone Encounter (Signed)
Prescription Refill Request     Incoming call from patient requesting refill   Name of PCP Provider: Rickey Primus, [place] office visit: 10/27/2017  Next office visit: None  Requested Medication/s:   Current Outpatient Medications   Medication Sig Dispense Refill    levothyroxine (SYNTHROID) 125 MCG tablet Take 1 tablet (125 mcg) by mouth every morning (before breakfast). 30 tablet 0    LORazepam (ATIVAN) 0.5 MG tablet Take 0.5 mg by mouth 2 times daily as needed.      sertraline (ZOLOFT) 25 MG tablet Take 25 mg by mouth 2 times daily.       Amlodipine 5 MG    Patient has made an attempt to refill with pharmacy? NO    Send to:     [facility], [place] [place]  [place] [address]  Phone: [phone] Fax: [phone]      Has been advised this message will be transmitted to office and if any issues can expect a response within the next 24-72 hours, otherwise pharmacy should be contacting patient when refill is processed.

## 2018-03-29 NOTE — Telephone Encounter (Signed)
Please review and advise. Please generate referral if appropriate.

## 2018-03-30 ENCOUNTER — Telehealth (INDEPENDENT_AMBULATORY_CARE_PROVIDER_SITE_OTHER): Payer: Self-pay | Admitting: Internal Medicine

## 2018-03-30 ENCOUNTER — Other Ambulatory Visit: Payer: Self-pay | Admitting: Case Management

## 2018-03-30 DIAGNOSIS — R52 Pain, unspecified: Secondary | ICD-10-CM

## 2018-03-30 MED ORDER — NALOXONE HCL 4 MG/0.1ML NA LIQD
NASAL | 3 refills | Status: AC
Start: 2018-03-30 — End: ?

## 2018-03-30 MED ORDER — LORAZEPAM 0.5 MG OR TABS
.50 mg | ORAL_TABLET | Freq: Two times a day (BID) | ORAL | 0 refills | Status: AC | PRN
Start: 2018-03-30 — End: ?

## 2018-03-30 NOTE — Telephone Encounter (Signed)
I am unclear why this request was generated. I had already submitted referral for case manager for patient in past and we were working on it. Is there an issue with that prior referral?    Dr Louretta Parma

## 2018-03-30 NOTE — Telephone Encounter (Signed)
Who is calling: [employer] Coverage Verified: Active- in network    Reason for this call:   [employer] is requesting the correct directions for medication naloxone (NARCAN) 4 mg/0.1 mL nasal spray. They said, an signed order was faxed back but no proper instructions.  Please advise, thank you.     Best way to contact:     [phone]    Joylene Igo [phone]    Inquiry has been read verbatim to this caller. Verbalizes satisfaction and confirms the above is accurate: yes    Has been advised this message will be transmitted to office and can expect a response within the next 24-72 hours.

## 2018-03-30 NOTE — Telephone Encounter (Signed)
Signed pended order.     Thank you    Dr Louretta Parma

## 2018-03-30 NOTE — Telephone Encounter (Signed)
Signed order for mail order.    Dr Louretta Parma

## 2018-03-30 NOTE — Telephone Encounter (Signed)
Pended.

## 2018-04-01 NOTE — Telephone Encounter (Signed)
Routing to managed care team.

## 2018-04-02 NOTE — Telephone Encounter (Signed)
being addressed in separate encounter See pt outreach   03/15/18

## 2018-04-02 NOTE — Telephone Encounter (Signed)
Internal Medicine Clinic Social Work Outreach    Reason for [employer]:  Requested by cm to outreach again to pt   Assessment/Intervention  Chart review, appreciate CM note regarding pts return call and expressing a desire for additional outreach from SW.  Call to pt, no answer.  Left detailed msg encouraging a return call.   Plan for next Outreach:  Assess needs and provide resources  Next Tentative Outreach:  1-2 business days

## 2018-04-02 NOTE — Telephone Encounter (Signed)
Managed Care Social Work Patient Outreach    Emily Ball is a 67 year old female who I have contacted regarding: homebound pt, high risk score    Social/Environmental:  Living Arrangements Friends   Type of Residence Private Residence   Income Retirement/Pension   Monthly Amount $1800 SSA plus $350 dmv pension   Payee Self   Support System Friends   Family Conflict Yes   Current Level of Functioning Dependent on others   Transportation Other (Comment)(Pt is bed bound and does not leave the home)   Transportation Means other (see comment)(will require a gurney transport)   Difficulty Keeping Appointments Yes     Mental Health Status:  Physical No issues   Emotional     Psychological Pessimism   Behavioral No issues   Orientation x4   Current Mental Health Issues none identified   Past Mental Health Issues unknown         Other Pertinent Information:  Primary Decision Maker Self   Advance Directive     Medication Compliance     Interpreter Used? Not Needed   Cultural/Religious Concerns Jehovah witness   Information Obtained From Patient     Social Determinants of Health reviewed: finances, food security, physical activity and activities of daily living  End of Life planning: not discussed    Assessment/Interventions:  Spoke with pt who reports that she lives in her own mobile home which she has paid for outright.  Pt pays space rent of $1000-1100 depending on cost of utilities monthly.  Pt pays her caregiver who comes 2x day $440/month for care and spends the rest of her money on food and personal expenses.  Pt states she has been in bed since her last trip to her doctor and can not dress her self or get out of bed because although she has a hoyer lift, there is no one present in her home to operate it (pt weights 320lbs).  Pt does have a friend who lives with her and will be staying with her till august but does not provide personal care, she assists with meal prep and cleaning.  Discussed plans when her  friend leaves and pt indicates she does not have a plan yet but" will leave it up to Jehovah".  Pt reports she was engaged with [facility] in the past but has not had any services from them since her son was present (reports they stopped services due to his behavior).  Pt declined to re-engage services stating she does not wish to wait a year on the list before they re-start, reminded pt time will pass regardless of waitlist and encouraged pt to reconsider options.  Discussed placement options if no caregiver is available, pt states that she lives at [facility] for 2 and 1/2 years following her CVA and never will return to a facility of any kind again (reviewed Assisted living vs SNF) and cost of care including applying for [place] refused to consider any options other than her home.  Discussed plan in the event of a fire or need to evacuate, pt indicates she can call 911 but is resigned to the fact she may die in her home if there are competing priorities in rescues.  Discussed cost of pvt duty agencies and offered to provide list, however pt emphasized limited income and no additional support to cover cost of caregiving.  Pt agreed to have information mailed. Discussed [employer] transportation, pt requires a gurney or someone to assist with  care and seems reluctant to accept information on pvt pay gurney transport-indicating someone would still need to get me dressed and on the gurney, attempted to problem solve options-higher someone to dress and prep her to go to the doctor.  Pt reports she had [facility] home visit in the past but did not find them helpful and chose [facility] instead, but now seems resigned to not follow up with appts due to inability to obtain transportation, stating "I guess I just wont go to the doctor any more".  Discussed concern for PCP follow up and need to have a provider see her for future medication refills.  Pt is agreeable to a home visit from this SW-visit scheduled for 6/26 at 10  am.    Collaborated with Collie Siad RN, Maude Leriche and Rica Records, discussed need for provider follow up and reviewed option for a home provider visit-not a covered benefit.  Suggested [facility] for assessment (pt had [facility] care for [facility] following last PCP visit).      Call to pt to confirm she is open to [facility] for further assistance and support-pt is in agreement but expresses awareness that [facility] is short term and she will not likely make any improvement with therapy since her stroke was 10 years ago.    Will request PCP to consider re-referral for [facility] for additional in home support.    Plan for Next Outreach  [facility]-will explore [employer] for home share connection (may not qualify as pt does not have a comprehensive caregiver plan), encourage re-referral to [facility], explore alternative options for provider support ([employer]-should pt wish to change insurance coverage to gain home provider visits)     The next outreach is tentatively scheduled for:  03/19/18

## 2018-04-04 NOTE — Telephone Encounter (Signed)
Signed order for new home health referral.     Dr Louretta Parma

## 2018-04-04 NOTE — Addendum Note (Signed)
Addended by: Rickey Primus, Helena Sardo on: 04/04/2018 03:12 PM     Modules accepted: Orders

## 2018-04-05 NOTE — Telephone Encounter (Signed)
Spoke to pt 6/3 and once since then regarding printed script, pt requesting to be sent to [facility], but [employer] mail controlled rx, pt informed and stated she was trying to find someone who could pick up Rx for her due to her immobility . Pt didn't want   rx sent e-scribe until she organized picking up. sent letter asking what pt would like to do in regards to Rx. closing
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
## 2018-04-08 ENCOUNTER — Telehealth (INDEPENDENT_AMBULATORY_CARE_PROVIDER_SITE_OTHER): Payer: Self-pay | Admitting: Internal Medicine

## 2018-04-08 NOTE — Telephone Encounter (Signed)
Routing to [employer] for referral review.

## 2018-04-08 NOTE — Telephone Encounter (Signed)
Noted, order will be sent to [facility].

## 2018-04-08 NOTE — Telephone Encounter (Signed)
Who is calling: Incoming call from patient and Ancillary: Arlene from [facility] is calling on behalf of patient  Insurance Coverage Verified: Active- in network     Reason for this call: Arlee states she is calling regarding referral [phone] . They are unable to help with this patient at this time, they only have female nurses and patient has a wound on her genital area. For any questions please call them .     Action required by office: Please contact caller    Duplicate encounter? No previous documentation found on this issue.     Best way to [phone]    Inquiry has been read verbatim to this caller. Verbalizes satisfaction and confirms the above is accurate: yes      Has been advised this message will be transmitted to office and can expect a response within the next 24-72 hours.

## 2018-04-09 ENCOUNTER — Other Ambulatory Visit (INDEPENDENT_AMBULATORY_CARE_PROVIDER_SITE_OTHER): Payer: Self-pay | Admitting: Internal Medicine

## 2018-04-09 DIAGNOSIS — Z5181 Encounter for therapeutic drug level monitoring: Secondary | ICD-10-CM

## 2018-04-09 DIAGNOSIS — R52 Pain, unspecified: Secondary | ICD-10-CM

## 2018-04-09 DIAGNOSIS — R42 Dizziness and giddiness: Secondary | ICD-10-CM

## 2018-04-09 NOTE — Telephone Encounter (Signed)
Prescription Refill Request       Name of PCP Provider: Rickey Primus, [place] office visit: 10/27/2017  Next office visit: Visit date not found      Requested Medication/s:     meclizine (ANTIVERT) tablet 25 mg     HYDROcodone-acetaminophen (NORCO) 5-325 MG tablet Take 1 tablet by mouth every 6 hours as needed for Moderate Pain (Pain Score 4-6). 60 tablet 0       Patient has made an attempt to refill with pharmacy? [place]  [address]    Send to:       [facility] [place], [address]  [place] [address]  Phone: [phone] Fax: [phone]      Has been advised this message will be transmitted to office and if any issues can expect a response within the next 24-72 hours, otherwise pharmacy should be contacting patient when refill is processed.

## 2018-04-13 ENCOUNTER — Other Ambulatory Visit: Payer: Self-pay

## 2018-04-13 ENCOUNTER — Other Ambulatory Visit: Payer: Self-pay | Admitting: Case Management

## 2018-04-14 ENCOUNTER — Other Ambulatory Visit: Payer: Self-pay | Admitting: Case Management

## 2018-04-16 NOTE — Telephone Encounter (Signed)
Rx request for norco and antivert  Date controlled substance last refilled: 03/14/18  Amount refilled: 36  Last office visit with PCP: 10/27/2017  Last office visit this clinic: 10/27/2017  Next office visit with PCP: Visit date not found   (should be every 3 months for opiates)  Next office visit this clinic: Visit date not found    Last CURES report documented (should be every 4 months): 03/14/18  Controlled Substance agreement signed? 10/27/17  Last urine drug screen (within 1 year): none  No results found for: AMPCLASS, BARBCLASS, BENZYLCGNN, BENZDIAZCL, METHADONE, OPIATESCL, OXY, PHENCYCLDN, TETCANNABIN, UDI      Resident providers: Please document if medication approved and route to Attending Provider to e-prescribe to Pharmacy.

## 2018-04-19 NOTE — Telephone Encounter (Signed)
Routing to cross coverage for assistance.

## 2018-04-20 MED ORDER — MECLIZINE HCL 25 MG OR TABS
25.0000 mg | ORAL_TABLET | Freq: Three times a day (TID) | ORAL | 0 refills | Status: DC | PRN
Start: 2018-04-20 — End: 2018-08-18

## 2018-04-20 NOTE — Telephone Encounter (Signed)
Refill appears appropriate pending CURES review.  Due for urine drug screen - ordered.  Will route to Dr. Ronalee Red for CURES review.

## 2018-04-21 ENCOUNTER — Other Ambulatory Visit (INDEPENDENT_AMBULATORY_CARE_PROVIDER_SITE_OTHER): Payer: Self-pay

## 2018-04-21 MED ORDER — HYDROCODONE-ACETAMINOPHEN 5-325 MG OR TABS
1.0000 | ORAL_TABLET | Freq: Four times a day (QID) | ORAL | 0 refills | Status: DC | PRN
Start: 2018-04-21 — End: 2018-04-30

## 2018-04-21 NOTE — Telephone Encounter (Signed)
Call to pt no answer, left msg with update on SOAR referral and request for a return call.  Will continue to follow up.

## 2018-04-21 NOTE — Telephone Encounter (Signed)
I ran the patient's name and date-of-birth through the [place] [employer] Prescription Drug Monitoring Program CURES website which will list controlled drug prescriptions filled at [facility] pharmacies in the past 12 months. The information in the patient's CURES report is consistent with their self-report and shows no evidence of medication abuse or doctor shopping.   Approved refill, will close encounter. AS

## 2018-04-21 NOTE — Telephone Encounter (Signed)
Routing to MD for response on Norco pended below.

## 2018-04-22 ENCOUNTER — Other Ambulatory Visit: Payer: Self-pay | Admitting: Case Management

## 2018-04-22 ENCOUNTER — Other Ambulatory Visit (INDEPENDENT_AMBULATORY_CARE_PROVIDER_SITE_OTHER): Payer: Self-pay | Admitting: Internal Medicine

## 2018-04-22 DIAGNOSIS — E039 Hypothyroidism, unspecified: Secondary | ICD-10-CM

## 2018-04-22 DIAGNOSIS — B3731 Acute candidiasis of vulva and vagina: Secondary | ICD-10-CM

## 2018-04-22 DIAGNOSIS — F419 Anxiety disorder, unspecified: Secondary | ICD-10-CM

## 2018-04-22 DIAGNOSIS — G629 Polyneuropathy, unspecified: Secondary | ICD-10-CM

## 2018-04-22 DIAGNOSIS — K5900 Constipation, unspecified: Secondary | ICD-10-CM

## 2018-04-22 DIAGNOSIS — R52 Pain, unspecified: Secondary | ICD-10-CM

## 2018-04-22 DIAGNOSIS — K219 Gastro-esophageal reflux disease without esophagitis: Secondary | ICD-10-CM

## 2018-04-22 NOTE — Telephone Encounter (Signed)
Staff Message received from [place], Managed Care Case Mgr. Tele encounter created and routing to Resident MA/LVN for follow-up.    ----- Message -----   From: Ronette Deter, RN   Sent: 04/22/2018 11:03 AM   To: Adella Nissen, MSW, *   Subject: Medications                      "Per conversation with our patient today, she reported the following:     Needs Glycolax, Senna, B12, Lansoprazole, Nystatin powder     Does not use ASA, per instructions of former PCP     Does not use Albuterol due to cost, still wants to have the medication due to wheezing     Needs Amlodipine that was prescribed at [facility], claims that she mentioned this when she was seen last January 2019     Patient refuses to pick up [place] from [facility] and wants to utilize [facility], was upset that it was not sent to [facility]. I spoke with [facility] to inquire if they have received the order. They did and they will reverse the order. Please send another order for Norco to [facility] ASAP. Patient is almost out of medications     Thank you.     Aurora C. Pulido RN Case Manager - [employer] Team"

## 2018-04-22 NOTE — Telephone Encounter (Signed)
Pharmacist Note    To save patient money and facilitate better adherence recommend the following below. Will queue up orders for review.    1. Change lansoprazole to omeprazole. Change narcan spray to narcan vials. Lower tier for her health plan.  2. Send omeprazole, narcan vials, gabapentin, levothyroxine, lorazepam, nystatin, miralax, sertraline via [facility] order. Patient will pay no copay for 90d supplies from this pharmacy. Appears outside MD Alphonse Guild) prescribing sertraline for patient.  3. Keep/send inhalers and pain meds and new acute meds (I.e. Antibiotics) at [facility] as they will deliver to her home same day and there will be no copay difference between them and [facility] mail order.  4. Meclizine requires PA and will cost patient $100 per supply even if approved. Consider alternative therapy or discontinue.  5. PHT team helping patient coordinate OTC products be filled through [facility] OTC program : Vit B12, aspirin, senna    Karren Cobble, PharmD, PhD, [place] Team Pharmacist

## 2018-04-23 DIAGNOSIS — E785 Hyperlipidemia, unspecified: Secondary | ICD-10-CM | POA: Diagnosis not present

## 2018-04-23 DIAGNOSIS — I10 Essential (primary) hypertension: Secondary | ICD-10-CM | POA: Diagnosis not present

## 2018-04-23 LAB — COMPREHENSIVE METABOLIC PANEL
ALT: 26 IU/L (ref 0–32)
AST: 47 IU/L — ABNORMAL HIGH (ref 0–40)
Albumin/Globulin Ratio: 1.6 (ref 1.2–2.2)
Albumin: 4.2 g/dL (ref 3.6–4.8)
Alkaline Phosphatase: 64 IU/L (ref 39–117)
BUN/Creatinine Ratio: 22 (ref 12–28)
BUN: 24 mg/dL (ref 8–27)
Bilirubin Total: 0.5 mg/dL (ref 0.0–1.2)
CO2: 23 mmol/L (ref 20–29)
Calcium: 10 mg/dL (ref 8.7–10.3)
Chloride: 103 mmol/L (ref 96–106)
Creatinine, Ser: 1.09 mg/dL — ABNORMAL HIGH (ref 0.57–1.00)
GFR calc Af Amer: 60 mL/min/{1.73_m2} (ref >59)
GFR calc non Af Amer: 52 mL/min/{1.73_m2} — ABNORMAL LOW (ref >59)
Globulin, Total: 2.6 g/dL (ref 1.5–4.5)
Glucose: 80 mg/dL (ref 65–99)
Potassium: 3.6 mmol/L (ref 3.5–5.2)
Sodium: 142 mmol/L (ref 134–144)
Total Protein: 6.8 g/dL (ref 6.0–8.5)

## 2018-04-23 LAB — LIPID PANEL W/O CHOL/HDL RATIO
Cholesterol, Total: 120 mg/dL (ref 100–199)
HDL: 17 mg/dL — ABNORMAL LOW (ref >39)
LDL Calculated: 41 mg/dL (ref 0–99)
Triglycerides: 311 mg/dL — ABNORMAL HIGH (ref 0–149)
VLDL Cholesterol Cal: 62 mg/dL — ABNORMAL HIGH (ref 5–40)

## 2018-04-23 NOTE — Telephone Encounter (Signed)
Internal Medicine Clinic Social Work Outreach    Reason for [employer]  Follow up on left msg earlier in the week.  Assessment/Intervention  Spoke with pt who was upset that staff made an assumption that she would not accept a female care provider from [facility].  Discussed intention of HH was not so much for Personal care as pt already has paid personal care twice daily but for assisting her with her goal of transferring into her electric wheelchair.  Advised pt she will likely require two or three persons to be trained to use the hoyer.  Discussed options for her friend Perrin Smack or to ask members from her kingdom hall.  Pt indicates she has not asked because Perrin Smack is caring for her husband and had cataract surgery recently and member in her kingdom hall are busy.  Advised she may still want to explore options (pt is aware of agency costs and does not feel her income supports ability to hire).  Pt seems to lack insight or is resigned regarding steps needed to ensure she can get out of bed.  Reframed out of bed experience  to not be "all or nothing-never vs every day", suggested to striving to explore support system for training even if it is periodically.  Advised pt that her referral to [facility]AR was accepted and potential wait time is 3-4 months.    Discussed telephone communication, pt shared that she does not check her voice mails as she is not equipped to check them.  Relayed to pt that her phone appears to allow vms to be left which leaves the impression that information is being received by her.  Pt indicated again that voice mail is not an option. Pt did confirm that her [place] can be contacted if needed but that she has a busy life and does not usually check her msgs till the end of the day so she may not receive the information till the next day.    In basket to RN regarding [place] and desire to have either a female or female for care options.  Plan for next Outreach:  Assess forward plans for locating a person to assist with  tranferring within the limits of pts wishes  Next Tentative Outreach:  2-3 weeks  Time Spent in outreach:  25  PCP Involvement with Coordination of Care:  Louretta Parma

## 2018-04-24 MED ORDER — LEVOTHYROXINE SODIUM 125 MCG OR TABS
125.0000 ug | ORAL_TABLET | Freq: Every day | ORAL | 0 refills | Status: AC
Start: 2018-04-24 — End: ?

## 2018-04-24 MED ORDER — SERTRALINE HCL 25 MG OR TABS
25.0000 mg | ORAL_TABLET | Freq: Every day | ORAL | 3 refills | Status: DC
Start: 2018-04-24 — End: 2018-08-18

## 2018-04-24 MED ORDER — OMEPRAZOLE 20 MG OR CPDR
20.0000 mg | DELAYED_RELEASE_CAPSULE | Freq: Two times a day (BID) | ORAL | 0 refills | Status: DC
Start: 2018-04-24 — End: 2018-06-17

## 2018-04-24 MED ORDER — POLYETHYLENE GLYCOL 3350 OR POWD
17.0000 g | Freq: Every day | ORAL | 0 refills | Status: DC
Start: 2018-04-24 — End: 2018-04-30

## 2018-04-24 MED ORDER — NALOXONE HCL 0.4 MG/ML IJ SOLN
0.4000 mg | Freq: Once | INTRAMUSCULAR | 0 refills | Status: AC
Start: 2018-04-24 — End: 2018-04-24

## 2018-04-24 MED ORDER — SYRINGE 23G X 1" 3 ML MISC
0 refills | Status: AC
Start: 2018-04-24 — End: ?

## 2018-04-24 MED ORDER — GABAPENTIN 300 MG OR CAPS
300.0000 mg | ORAL_CAPSULE | Freq: Three times a day (TID) | ORAL | 3 refills | Status: AC
Start: 2018-04-24 — End: ?

## 2018-04-24 MED ORDER — NYSTOP 100000 UNIT/GM EX POWD
1.0000 | Freq: Two times a day (BID) | CUTANEOUS | 3 refills | Status: DC
Start: 2018-04-24 — End: 2018-04-30

## 2018-04-26 ENCOUNTER — Other Ambulatory Visit: Payer: Self-pay

## 2018-04-27 ENCOUNTER — Other Ambulatory Visit (INDEPENDENT_AMBULATORY_CARE_PROVIDER_SITE_OTHER): Payer: Self-pay | Admitting: Internal Medicine

## 2018-04-27 ENCOUNTER — Other Ambulatory Visit: Payer: Self-pay | Admitting: Case Management

## 2018-04-27 DIAGNOSIS — R52 Pain, unspecified: Principal | ICD-10-CM

## 2018-04-27 NOTE — Telephone Encounter (Signed)
Patient would like to have this Rx resent but to the [facility] mail in pharmacy.

## 2018-04-27 NOTE — Telephone Encounter (Signed)
ACTION NEEDED: LVN/MA to please f/u with Norco to be sent to Mail Order Pharm. Thank you!    Staff Message received from [place]; converted into Tele encounter.    Norco was sent to wrong pharm, needs to be sent to mail order pharm:    "Resending this request to put in another order for Norco and to send it to [facility]. Patient refuses to deal with [facility], her former Pharmacy where the order was sent earlier.      Thank you for your attention to this request.     Aurora C. Pulido RN Case Manager - [employer] Team"

## 2018-04-30 ENCOUNTER — Other Ambulatory Visit (INDEPENDENT_AMBULATORY_CARE_PROVIDER_SITE_OTHER): Payer: Self-pay | Admitting: Internal Medicine

## 2018-04-30 DIAGNOSIS — K219 Gastro-esophageal reflux disease without esophagitis: Secondary | ICD-10-CM

## 2018-04-30 DIAGNOSIS — B3731 Acute candidiasis of vulva and vagina: Secondary | ICD-10-CM

## 2018-04-30 DIAGNOSIS — E538 Deficiency of other specified B group vitamins: Secondary | ICD-10-CM

## 2018-04-30 DIAGNOSIS — I10 Essential (primary) hypertension: Secondary | ICD-10-CM

## 2018-04-30 DIAGNOSIS — K5900 Constipation, unspecified: Secondary | ICD-10-CM

## 2018-04-30 DIAGNOSIS — R52 Pain, unspecified: Secondary | ICD-10-CM

## 2018-04-30 NOTE — Telephone Encounter (Signed)
Copied and pasted from staff message from [place]  Refill request: Glycolax, Senna, B12, Lansoprazole, Nystatin powder, amlodipine, norco(sent 04/21/18 to [facility] pharmacy needs to go to [facility] per pt request)    "Per conversation with our patient today, she reported the following:   Needs Glycolax, Senna, B12, Lansoprazole, Nystatin powder   Does not use ASA, per instructions of former PCP   Does not use Albuterol due to cost, still wants to have the medication due to wheezing   Needs Amlodipine that was prescribed at [facility], claims that she mentioned this when she was seen last January 2019   Patient refuses to pick up [place] from [facility] and wants to utilize [facility], was upset that it was not sent to [facility]. I spoke with [facility] to inquire if they have received the order. They did and they will reverse the order. Please send another order for Norco to [facility] ASAP. Patient is almost out of medications   Thank you.     Aurora C. Pulido RN Case Manager - [place] Team "    "Resending this request to put in another order for [place] and to send it to [facility]. Patient refuses to deal with [facility], her former Pharmacy where the order was sent earlier.      Thank you for your attention to this request. "

## 2018-05-01 DIAGNOSIS — I10 Essential (primary) hypertension: Secondary | ICD-10-CM | POA: Insufficient documentation

## 2018-05-01 DIAGNOSIS — E538 Deficiency of other specified B group vitamins: Secondary | ICD-10-CM | POA: Insufficient documentation

## 2018-05-01 MED ORDER — POLYETHYLENE GLYCOL 3350 OR POWD
17.00 g | Freq: Every day | ORAL | 0 refills | Status: AC
Start: 2018-05-01 — End: ?

## 2018-05-01 MED ORDER — VITAMIN B-12 1000 MCG OR TABS
1000.00 ug | ORAL_TABLET | Freq: Every day | ORAL | 3 refills | Status: AC
Start: 2018-05-01 — End: ?

## 2018-05-01 MED ORDER — HYDROCODONE-ACETAMINOPHEN 5-325 MG OR TABS
1.0000 | ORAL_TABLET | Freq: Four times a day (QID) | ORAL | 0 refills | Status: DC | PRN
Start: 2018-05-01 — End: 2018-06-09

## 2018-05-01 MED ORDER — LANSOPRAZOLE 30 MG OR CPDR
30.00 mg | DELAYED_RELEASE_CAPSULE | Freq: Two times a day (BID) | ORAL | 3 refills | Status: AC
Start: 2018-05-01 — End: ?

## 2018-05-01 MED ORDER — AMLODIPINE 2.5 MG OR TABS
2.5000 mg | ORAL_TABLET | Freq: Every day | ORAL | 0 refills | Status: DC
Start: 2018-05-01 — End: 2018-05-04

## 2018-05-01 MED ORDER — NYSTOP 100000 UNIT/GM EX POWD
1.00 | Freq: Two times a day (BID) | CUTANEOUS | 3 refills | Status: AC
Start: 2018-05-01 — End: ?

## 2018-05-01 MED ORDER — SENNA 8.6 MG OR TABS
8.60 mg | ORAL_TABLET | Freq: Every day | ORAL | 3 refills | Status: AC
Start: 2018-05-01 — End: ?

## 2018-05-03 ENCOUNTER — Telehealth (INDEPENDENT_AMBULATORY_CARE_PROVIDER_SITE_OTHER): Payer: Self-pay | Admitting: Internal Medicine

## 2018-05-03 DIAGNOSIS — I10 Essential (primary) hypertension: Secondary | ICD-10-CM

## 2018-05-03 NOTE — Telephone Encounter (Signed)
Routing to Resident Pool.

## 2018-05-03 NOTE — Telephone Encounter (Signed)
Emily Ball from [facility] is calling stating doctor order medication for amLODIPINE (NORVASC) 2.5 MG tablet    Requesting for doctor to resend refill requesting for a 90 days or three months refill. Patient does not have copay if doctor orders three months of medication at a time.       Please assist.

## 2018-05-03 NOTE — Telephone Encounter (Signed)
Routing to MD for clarification

## 2018-05-03 NOTE — Telephone Encounter (Signed)
Emily Ball from [facility] is calling regarding medication doctor order for lansoprazole (PREVACID) 30 MG capsule    Pharmacy states doctor already order medication omeprazole (PRILOSEC) 20 MG capsule    And pharmacy has already mailed out medication to patient. Pharmacy is requesting to know if doctor wants patient on which medication and to please contact them. [phone]. [phone]      Please assist.

## 2018-05-04 ENCOUNTER — Telehealth (INDEPENDENT_AMBULATORY_CARE_PROVIDER_SITE_OTHER): Payer: Self-pay | Admitting: Internal Medicine

## 2018-05-04 DIAGNOSIS — M150 Primary generalized (osteo)arthritis: Secondary | ICD-10-CM | POA: Diagnosis not present

## 2018-05-04 DIAGNOSIS — M0589 Other rheumatoid arthritis with rheumatoid factor of multiple sites: Secondary | ICD-10-CM | POA: Diagnosis not present

## 2018-05-04 DIAGNOSIS — Z79899 Other long term (current) drug therapy: Secondary | ICD-10-CM | POA: Diagnosis not present

## 2018-05-04 DIAGNOSIS — M5136 Other intervertebral disc degeneration, lumbar region: Secondary | ICD-10-CM | POA: Diagnosis not present

## 2018-05-04 NOTE — Telephone Encounter (Signed)
Lansoprazole changed to Omeprazole per tele encounter 04/22/18     No PAR needed     Closing     Myer Haff, CPhT  ([facility])

## 2018-05-04 NOTE — Telephone Encounter (Signed)
Please see message below  Rx refilled 05/01/18 30 days  New order pended starting 06/01/18 with 3 refills .  Routing to Md for review

## 2018-05-04 NOTE — Telephone Encounter (Signed)
PA request for lansoprazole has been placed in queue for processing. Please allow up to 72 business hours for initiation. If this is an urgent request due to immediate therapy please re-route as high priority.     Thank you so much!     [facility] Refill [facility]

## 2018-05-05 ENCOUNTER — Telehealth (INDEPENDENT_AMBULATORY_CARE_PROVIDER_SITE_OTHER): Payer: Self-pay | Admitting: Internal Medicine

## 2018-05-05 NOTE — Telephone Encounter (Signed)
Per refill encounter 04/22/18  Change lansoprazole to omeprazole.

## 2018-05-06 NOTE — Telephone Encounter (Signed)
Routing to Cross coverage for assistance.

## 2018-05-07 MED ORDER — AMLODIPINE 2.5 MG OR TABS
2.50 mg | ORAL_TABLET | Freq: Every day | ORAL | 3 refills | Status: AC
Start: 2018-06-01 — End: ?

## 2018-05-07 NOTE — Telephone Encounter (Signed)
Refills approved and encounter signed.

## 2018-05-10 ENCOUNTER — Telehealth (HOSPITAL_BASED_OUTPATIENT_CLINIC_OR_DEPARTMENT_OTHER): Payer: Self-pay

## 2018-05-10 NOTE — Telephone Encounter (Signed)
This patient came to our attention because she is homebound and had been recieving provider home visits when she was with Ivar Bury last year.  She  is not able to come to the clinic to see her PCP. In an effort to ensure patient is safe at home, we secured [facility] to do a provider home visits [phone]). They had an NP go out on July 24th. Awaiting final notes, and will upload to Epic in media section. Patient was very pleased with [facility] visit, has [place] in place now and they are doing visits. Patient hopes to see the mobile NP next month. I speoke with [facility], they are sending notes, had a good visit with the patient and plan to visit again next month. Managed Care UM will continue communication with [employer] and [facility] to authorize additional provider home visits and home health as needed.

## 2018-05-11 NOTE — Telephone Encounter (Signed)
Chart reviewed. Noted Dr Louretta Parma sent Rx to pt's requested mail order pharm on 05/01/18.     Closing encounter.

## 2018-05-12 NOTE — Telephone Encounter (Addendum)
Internal Medicine Clinic Social Work Outreach    Reason for Outreach:  Scheduled outreach  Assessment/Intervention  Spoke with pt who reports that War from [facility] is with her now.  Discussed pts goal of getting out of bed and in her chair, and encouraged pt to work with her therapist regarding goals.  Pt expressed grattitude that [place] has started and is following her.  Pt voices no needs or concerns but is open to future calls.    Plan for next Outreach:  Will follow up with pt regarding  her room mate Baker Janus and plan to leave in August and confirm advance directives or polst.  Next Tentative Outreach:  One week  Time Spent in outreach:  5 min  PCP Involvement with Coordination of Care:  [place]-will update regarding hh and NP home visit

## 2018-05-19 ENCOUNTER — Other Ambulatory Visit: Payer: Self-pay | Admitting: Internal Medicine

## 2018-05-19 ENCOUNTER — Other Ambulatory Visit (INDEPENDENT_AMBULATORY_CARE_PROVIDER_SITE_OTHER): Payer: Self-pay
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
## 2018-05-21 ENCOUNTER — Other Ambulatory Visit: Payer: Self-pay

## 2018-05-24 NOTE — Telephone Encounter (Signed)
Email received from [place] requesting a tc to discuss pts case.  Call to [facility], no answer.  Provided direct contact information for return call.

## 2018-05-25 NOTE — Telephone Encounter (Signed)
TC from [place] at [place].  CM hand off provided.  Discussed pts care needs including physician follow up and care giving needs.  Reviewed pts history and decision making and pts self determination related to resource limits and desire to remain in her home vs higher level of care options previously presented.  Reviewed current HH involvement, efforts to provide additional options for pvt pay caregivers and currently utilized resources along with pts pending status for SOARs program through [facility].    Jamelle Haring provided [employer] SW contact Aide Ghavami at [phone] for any further collaboration or support.  Jamelle Haring advised this SW to collaborate with Harriet Masson for further direct interaction with [facility] for additional CM Services.  When asked what additional services might be an option, Jamelle Haring indicated only servicedswith in the confines of the Medicare Advantage product.      Will continue to coordinate care with CN Tammy and schedule  Next outreach with in two weeks.

## 2018-05-26 ENCOUNTER — Other Ambulatory Visit: Payer: Self-pay

## 2018-06-02 NOTE — Telephone Encounter (Signed)
Return call from Am[place] at [facility] who reports that [facility]ARs denied pt for case management services however he was not able to determine the reason for denial.  Discussed alternative case management programs such at [employer], advised Nate pt does not have a significant mental health diagnosis other than situational depression.  Nate encouraged pt to call in to [facility] and request case management in the event pt was declined due to inability for them to connect with pt for services.  Nate also explored if pt has her current caregiver in place, advised that pt does have a caregiver Baker Janus and she is still in the home but planning potentially to leave potentially in September.    Call to pt and advised her of the above conversation with Nate, encouraged pt to call [facility].  Pt in agreement but requested number be emailed to her.  Pt had not looked at her email and could not confirm previously sent email.  Pt did indicated she has not received a telephone call or any mail from the county indicating she was declined services.  Explored current caregiver in her home, pt indicates she does have a caregiver.  Discussed protective concern if pt is bed bound and living alone, pt reflected she does knows "[facility] will try to put her in a home and she wont allow that".  Reinforced goal of keeping pt safe.   Pt voiced plan to reach out to [facility].    Will continue to follow.

## 2018-06-02 NOTE — Telephone Encounter (Signed)
Internal Medicine Clinic Social Work Outreach    Reason for [employer]:  Scheduled follow up  Assessment/Intervention  Call to Nate at [facility] intake who took contact information and request for an update on waitlist status.  Call to pt to update her of request to hear from [facility] on location of wait list, pt seems somewhat reluctant to have assistance from [facility] stating she feels they are marginal.  Pt indicates that she feels like no one really wants to help her and yet she is aware of the limits of agency support.  Reviewed that Tammy had sent her some Caregiver information and this swer had also provided a list in the home visit.  Pt expressed that she had too many papers and it is not easy to manage.  Explored pts use of email and preference to have info sent electronically.  Pt provided email address and list of Home care registries and updated list of home care agencies with prices and hourly minimum sent to pt at her confirmed address.  Plan for next Outreach:  Will update pt when [facility] provides information  Next Tentative Outreach:  With in two weeks  Time Spent in outreach:  20 min  PCP Involvement with Coordination of Care:  Louretta Parma

## 2018-06-03 ENCOUNTER — Other Ambulatory Visit (INDEPENDENT_AMBULATORY_CARE_PROVIDER_SITE_OTHER): Payer: Self-pay | Admitting: Internal Medicine

## 2018-06-03 DIAGNOSIS — R52 Pain, unspecified: Secondary | ICD-10-CM

## 2018-06-03 NOTE — Telephone Encounter (Signed)
Prescription Refill Request     Incoming call from patient requesting refill   Name of PCP Provider: Rickey Primus, [place] office visit: Visit date not found  Next office visit: Visit date not found    Allergies   Allergen Reactions    Pcn [Penicillins] Anaphylaxis    Strawberry Rash     Fresh strawberries per patient.     Sulfa Drugs Rash    Aspirin-Dipyridamole Unspecified     pruritus    Nitrous Oxide Other     Gets paranoid from, given at dentist before    Trazodone Other     Nightmares    Zolpidem Unspecified     Doesn't help sleep and gets anxious       Requested Medication/s:   HYDROcodone-acetaminophen ([place]) 5-325 MG tablet      Patient has made an attempt to refill with pharmacy? NO    Send to:     [place], [address]        Has been advised this message will be transmitted to office and if any issues can expect a response within the next 24-72 hours, otherwise pharmacy should be contacting patient when refill is processed.

## 2018-06-03 NOTE — Telephone Encounter (Signed)
Controlled substances are not currently included in Pharmacy Refill Clinic protocol. Re-routing to appropriate staff for processing. Thank you.

## 2018-06-04 NOTE — Telephone Encounter (Signed)
Medication Requested: norco  Date controlled substance last refilled: 05/01/18  Amount refilled: 60 tab  Last office visit with PCP: Visit date not found  Last office visit this clinic: Visit date not found  Next office visit with PCP: Visit date not found   (should be every 3 months for opiates)  Next office visit this clinic: Visit date not found    Last CURES report documented (should be every 4 months): 04/21/18  Controlled Substance agreement signed? yes  Last urine drug screen (within 1 year): none  No results found for: AMPCLASS, BARBCLASS, BENZYLCGNN, BENZDIAZCL, METHADONE, OPIATESCL, OXY, PHENCYCLDN, TETCANNABIN, UDI      Resident providers: Please document if medication approved and route to Attending Provider to e-prescribe to Pharmacy.

## 2018-06-07 ENCOUNTER — Telehealth (INDEPENDENT_AMBULATORY_CARE_PROVIDER_SITE_OTHER): Payer: Self-pay | Admitting: Internal Medicine

## 2018-06-07 ENCOUNTER — Other Ambulatory Visit: Payer: Self-pay

## 2018-06-07 ENCOUNTER — Emergency Department
Admit: 2018-06-07 | Discharge status: Against Medical Advice | Payer: 59 | Attending: Emergency Medicine | Admitting: Emergency Medicine

## 2018-06-07 DIAGNOSIS — Z5321 Procedure and treatment not carried out due to patient leaving prior to being seen by health care provider: Principal | ICD-10-CM | POA: Insufficient documentation

## 2018-06-07 NOTE — Telephone Encounter (Signed)
Symptom Call          Next office visit:  Visit date not found  List the date of PCP first available: N/A  Did you offer Express Care/Urgent Care: No    What symptom is the patient experiencing? WARM - Patient's friend Baker Janus) calling in behalf of patient stating she is at a level 8 pain, patient has a abscess inside her mouth on the left had side and is bothering her nasal area and the whole left hand side face.  Patient unable to talk.       Name of PCP Provider: Rickey Primus, Marisue Ivan External PCP  Insurance Coverage Verified: Active- in network  Last office visit: Visit date not found    Who is reporting the symptoms? Baker Janus / Denman George     Is this a new or ongoing symptom? new  Estimated time since experiencing symptom(s)? Couple days     Best way to contact patient: N/ A  Alternative communication method: [phone] - Baker Janus

## 2018-06-07 NOTE — Telephone Encounter (Signed)
MD ACTION REQUESTED: NO/FYI only. Pt's friend said pt will not be able to tolerate to go to Urgent Care due to being bed bound.     RN ACTION: Advised pt to be evaluated in the ED. Pt has no mode of transportation. Advised friend to call 911 for the patient. ED referral sent.  Merge down applied: N/a  Decision Support tool used: [place]    Reason for call: Symptoms.  CC: Abscess L side inside the mouth. 8/10 throbbing pain.  Patient is barely able to talk due to pain.  Can take sips of water without aspirating.   Salivating.  Onset: 4 days  Associated symptoms: L arm, L hand 8/10 pain. L side of the face pain from abscess. Baseline L hemiplegia. Feeling feverish.     Does anything make it worse?   Have you tried anything to relieve your symptoms? Orajel, Norco with no relief.   Do you feel this issue can wait for you to see your PCP or do you want to be seen sooner by any provider? Pt wants to be seen. Directed to be evaluated in the ED if abscess need to be drained.    LMP: N/A    DENIES: bleeding abscess, rash, nausea or vomiting, weakness, dizziness, diff breathing, diff swallowing, SOB, chest pain.    ER Precautions reviewed: chest pain, sob, severe headache, numbness or weakness on one side, feeling faint/dizziness, confusion, slurred speech.   Patient verbalizes understanding and agrees with plan of care.  RN Encouraged patient to call back PRN or if symptoms worsen or persist.     [place] General Risk Score 16    Pertinent PMHx:   Patient Active Problem List   Diagnosis   . Cholangitis   . Vomiting   . Hypothyroidism, unspecified type   . History of cholangitis   . Open wound of genital labia, initial encounter   . Hemiplegia, unspecified etiology, unspecified hemiplegia type, unspecified laterality (CMS-HCC)   . Choledocholithiasis   . Hypertension   . B12 deficiency       No future appointments.  Last visit in this department 10/27/2017  Next visit in this department Visit date not found  RN confirmed pt's  pharmacy and allergies N/A     Date: 06/07/2018   Time: 10:36 AM   Name of PCP Provider: Rickey Primus, [facility]     Direct call transfer to Triage RN  Patient name: Emily Ball 67 year old   Verified DOB  Pt advised all calls are recorded for quality assurance.    Spoke to [place] (friend).

## 2018-06-07 NOTE — Telephone Encounter (Signed)
Routing to cross coverage for assistance.

## 2018-06-08 NOTE — Telephone Encounter (Signed)
No office visit since 10/27/2017. Last filled 05/01/2018. CURES last checked 05/01/2018.     Not appropriate for refill, needs to be seen in clinic q38months. Please schedule patient for office visit.

## 2018-06-09 MED ORDER — HYDROCODONE-ACETAMINOPHEN 5-325 MG OR TABS
1.0000 | ORAL_TABLET | Freq: Four times a day (QID) | ORAL | 0 refills | Status: AC | PRN
Start: 2018-06-09 — End: ?

## 2018-06-09 NOTE — Telephone Encounter (Signed)
Routing to front desk please call to schedule follow up-       No office visit since 10/27/2017. Last filled 05/01/2018. CURES last checked 05/01/2018.     Not appropriate for refill, needs to be seen in clinic q66months. Please schedule patient for office visit.

## 2018-06-09 NOTE — Telephone Encounter (Addendum)
Placed call to pt. No answer and unable to LVM. Will send pt a letter to home address, unable to send pt a mychart message.      Call Center: if pt calls back please assist pt with scheduling. Thank you    Reason for appt: follow up med refills.

## 2018-06-09 NOTE — Telephone Encounter (Signed)
Apologies for delay.     Will need to revise Dr. Isabell Jarvis note:  Patient is mostly homebound due to disability and coming to clinic appointments q51months places an extreme strain on patient.    As such will approve medication. CURES reviewed and appropriate for refill.     Dr Louretta Parma

## 2018-06-11 ENCOUNTER — Other Ambulatory Visit: Payer: Self-pay | Admitting: Internal Medicine

## 2018-06-11 MED ORDER — POTASSIUM CHLORIDE CRYS ER 20 MEQ PO TBCR: 20.0000 meq | EXTENDED_RELEASE_TABLET | Freq: Two times a day (BID) | ORAL | 4 refills | Status: DC

## 2018-06-11 NOTE — Telephone Encounter (Signed)
Potassium Chloride SA last refilled # 60 x 4 on 01/08/17. Last annual exam on 08/26/17 Next appt on 08/27/18 for CPX; [facility]. Please advise.

## 2018-06-11 NOTE — Telephone Encounter (Addendum)
Pt notified refill of K sent to [facility] in [place]; pt voiced understanding; pt has not been taking K and pt waking up at night with cramps and legs swelling. Pt will try the K and if that does not help pt will cb and let Avie Echevaria NP know. FYI to Avie Echevaria NP.

## 2018-06-11 NOTE — Telephone Encounter (Signed)
Copied from [place] [id number]. Topic: Quick Communication - Rx Refill/Question >> Jun 11, 2018  3:24 PM Oliver Pila B wrote: Medication: potassium chloride SA (K-DUR,KLOR-CON) 20 MEQ tablet [[id number]]   Has the patient contacted their pharmacy? Yes.   (Agent: If no, request that the patient contact the pharmacy for the refill.) (Agent: If yes, when and what did the pharmacy advise?)  Preferred Pharmacy (with phone number or street name): [facility]  Agent: Please be advised that RX refills may take up to 3 business days. We ask that you follow-up with your pharmacy.

## 2018-06-13 ENCOUNTER — Inpatient Hospital Stay
Admission: EM | Admit: 2018-06-13 | Discharge: 2018-06-18 | DRG: 603 | Disposition: A | Discharge status: Home / Self Care | Payer: 59 | Source: Other Acute Inpatient Hospital | Attending: Internal Medicine | Admitting: Internal Medicine

## 2018-06-13 ENCOUNTER — Other Ambulatory Visit: Payer: Self-pay

## 2018-06-13 DIAGNOSIS — I69354 Hemiplegia and hemiparesis following cerebral infarction affecting left non-dominant side: Secondary | ICD-10-CM

## 2018-06-13 DIAGNOSIS — Z91018 Allergy to other foods: Secondary | ICD-10-CM

## 2018-06-13 DIAGNOSIS — Z7401 Bed confinement status: Secondary | ICD-10-CM

## 2018-06-13 DIAGNOSIS — Z23 Encounter for immunization: Secondary | ICD-10-CM

## 2018-06-13 DIAGNOSIS — Z882 Allergy status to sulfonamides status: Secondary | ICD-10-CM

## 2018-06-13 DIAGNOSIS — Z886 Allergy status to analgesic agent status: Secondary | ICD-10-CM

## 2018-06-13 DIAGNOSIS — E890 Postprocedural hypothyroidism: Secondary | ICD-10-CM | POA: Diagnosis present

## 2018-06-13 DIAGNOSIS — L0201 Cutaneous abscess of face: Principal | ICD-10-CM | POA: Diagnosis present

## 2018-06-13 DIAGNOSIS — K219 Gastro-esophageal reflux disease without esophagitis: Secondary | ICD-10-CM | POA: Diagnosis present

## 2018-06-13 DIAGNOSIS — L03211 Cellulitis of face: Secondary | ICD-10-CM | POA: Diagnosis present

## 2018-06-13 DIAGNOSIS — F32A Depression, unspecified: Secondary | ICD-10-CM | POA: Diagnosis present

## 2018-06-13 DIAGNOSIS — F329 Major depressive disorder, single episode, unspecified: Secondary | ICD-10-CM | POA: Diagnosis present

## 2018-06-13 DIAGNOSIS — K05219 Aggressive periodontitis, localized, unspecified severity: Secondary | ICD-10-CM | POA: Diagnosis present

## 2018-06-13 DIAGNOSIS — G4733 Obstructive sleep apnea (adult) (pediatric): Secondary | ICD-10-CM | POA: Diagnosis present

## 2018-06-13 DIAGNOSIS — K029 Dental caries, unspecified: Secondary | ICD-10-CM | POA: Diagnosis present

## 2018-06-13 DIAGNOSIS — F419 Anxiety disorder, unspecified: Secondary | ICD-10-CM | POA: Diagnosis present

## 2018-06-13 DIAGNOSIS — G8190 Hemiplegia, unspecified affecting unspecified side: Secondary | ICD-10-CM

## 2018-06-13 DIAGNOSIS — R740 Nonspecific elevation of levels of transaminase and lactic acid dehydrogenase [LDH]: Secondary | ICD-10-CM | POA: Diagnosis present

## 2018-06-13 DIAGNOSIS — H409 Unspecified glaucoma: Secondary | ICD-10-CM | POA: Diagnosis present

## 2018-06-13 DIAGNOSIS — Z8673 Personal history of transient ischemic attack (TIA), and cerebral infarction without residual deficits: Secondary | ICD-10-CM

## 2018-06-13 DIAGNOSIS — Z7409 Other reduced mobility: Secondary | ICD-10-CM

## 2018-06-13 DIAGNOSIS — E1142 Type 2 diabetes mellitus with diabetic polyneuropathy: Secondary | ICD-10-CM | POA: Diagnosis present

## 2018-06-13 DIAGNOSIS — G629 Polyneuropathy, unspecified: Secondary | ICD-10-CM

## 2018-06-13 DIAGNOSIS — Z90710 Acquired absence of both cervix and uterus: Secondary | ICD-10-CM

## 2018-06-13 DIAGNOSIS — Z880 Allergy status to penicillin: Secondary | ICD-10-CM

## 2018-06-13 DIAGNOSIS — Z8542 Personal history of malignant neoplasm of other parts of uterus: Secondary | ICD-10-CM

## 2018-06-13 DIAGNOSIS — I2510 Atherosclerotic heart disease of native coronary artery without angina pectoris: Secondary | ICD-10-CM | POA: Diagnosis present

## 2018-06-13 DIAGNOSIS — Z79899 Other long term (current) drug therapy: Secondary | ICD-10-CM

## 2018-06-13 DIAGNOSIS — Z888 Allergy status to other drugs, medicaments and biological substances status: Secondary | ICD-10-CM

## 2018-06-13 DIAGNOSIS — G8929 Other chronic pain: Secondary | ICD-10-CM | POA: Diagnosis present

## 2018-06-13 DIAGNOSIS — Z7982 Long term (current) use of aspirin: Secondary | ICD-10-CM

## 2018-06-13 DIAGNOSIS — E039 Hypothyroidism, unspecified: Secondary | ICD-10-CM

## 2018-06-13 DIAGNOSIS — I10 Essential (primary) hypertension: Secondary | ICD-10-CM | POA: Diagnosis present

## 2018-06-13 DIAGNOSIS — Z66 Do not resuscitate: Secondary | ICD-10-CM | POA: Diagnosis present

## 2018-06-13 DIAGNOSIS — Z825 Family history of asthma and other chronic lower respiratory diseases: Secondary | ICD-10-CM

## 2018-06-13 DIAGNOSIS — Z6841 Body Mass Index (BMI) 40.0 and over, adult: Secondary | ICD-10-CM

## 2018-06-13 DIAGNOSIS — I4581 Long QT syndrome: Secondary | ICD-10-CM | POA: Diagnosis present

## 2018-06-13 MED ORDER — ENOXAPARIN SODIUM 30 MG/0.3ML SC SOLN
30.0000 mg | Freq: Two times a day (BID) | SUBCUTANEOUS | Status: DC
Start: 2018-06-13 — End: 2018-06-13

## 2018-06-13 MED ORDER — ACETAMINOPHEN 325 MG PO TABS
650.0000 mg | ORAL_TABLET | Freq: Three times a day (TID) | ORAL | Status: DC
Start: 2018-06-13 — End: 2018-06-13

## 2018-06-13 MED ORDER — ENOXAPARIN SODIUM 60 MG/0.6ML SC SOLN
0.50 mg/kg | Freq: Every day | SUBCUTANEOUS | Status: DC
Start: 2018-06-14 — End: 2018-06-18
  Filled 2018-06-13 (×3): qty 1

## 2018-06-13 MED ORDER — ASPIRIN 81 MG OR CHEW
81.00 mg | CHEWABLE_TABLET | Freq: Every day | ORAL | Status: DC
Start: 2018-06-14 — End: 2018-06-18
  Administered 2018-06-14 – 2018-06-15 (×2): 81 mg via ORAL
  Filled 2018-06-13 (×4): qty 1

## 2018-06-13 MED ORDER — GABAPENTIN 250 MG/5ML OR SOLN
100.00 mg | Freq: Three times a day (TID) | ORAL | Status: DC
Start: 2018-06-13 — End: 2018-06-13

## 2018-06-13 MED ORDER — DIPHENHYDRAMINE HCL 25 MG OR TABS OR CAPS CUSTOM
25.00 mg | ORAL_CAPSULE | ORAL | Status: DC | PRN
Start: 2018-06-13 — End: 2018-06-16
  Administered 2018-06-15: 25 mg via ORAL
  Filled 2018-06-13: qty 1

## 2018-06-13 MED ORDER — LANSOPRAZOLE 30 MG OR CPDR
30.0000 mg | DELAYED_RELEASE_CAPSULE | Freq: Every day | ORAL | Status: DC
Start: 2018-06-14 — End: 2018-06-16
  Administered 2018-06-14: 30 mg via ORAL
  Filled 2018-06-13 (×4): qty 1

## 2018-06-13 MED ORDER — OXYCODONE HCL 5 MG OR TABS
5.0000 mg | ORAL_TABLET | ORAL | Status: DC | PRN
Start: 2018-06-13 — End: 2018-06-13

## 2018-06-13 MED ORDER — OXYCODONE HCL 5 MG OR TABS
5.00 mg | ORAL_TABLET | Freq: Once | ORAL | Status: AC
Start: 2018-06-13 — End: 2018-06-13
  Administered 2018-06-13: 5 mg via ORAL
  Filled 2018-06-13: qty 1

## 2018-06-13 MED ORDER — CHLORHEXIDINE GLUCONATE 0.12 % MT SOLN
15.00 mL | Freq: Two times a day (BID) | OROMUCOSAL | Status: AC
Start: 2018-06-13 — End: 2018-06-18
  Administered 2018-06-13 – 2018-06-16 (×5): 15 mL via OROMUCOSAL
  Filled 2018-06-13 (×9): qty 15

## 2018-06-13 MED ORDER — SERTRALINE HCL 25 MG OR TABS
25.00 mg | ORAL_TABLET | Freq: Two times a day (BID) | ORAL | Status: DC
Start: 2018-06-13 — End: 2018-06-14
  Administered 2018-06-13 – 2018-06-14 (×2): 25 mg via ORAL
  Filled 2018-06-13 (×2): qty 1

## 2018-06-13 MED ORDER — SERTRALINE HCL 25 MG OR TABS
25.00 mg | ORAL_TABLET | Freq: Every day | ORAL | Status: DC
Start: 2018-06-14 — End: 2018-06-13

## 2018-06-13 MED ORDER — FIRST-MOUTHWASH BLM MT SUSP
10.00 mL | Freq: Four times a day (QID) | OROMUCOSAL | Status: DC | PRN
Start: 2018-06-13 — End: 2018-06-18

## 2018-06-13 MED ORDER — POLYETHYLENE GLYCOL 3350 OR PACK
17.00 g | PACK | Freq: Every day | ORAL | Status: DC
Start: 2018-06-14 — End: 2018-06-18
  Administered 2018-06-14 – 2018-06-18 (×5): 17 g via ORAL
  Filled 2018-06-13 (×5): qty 1

## 2018-06-13 MED ORDER — SENNA 8.6 MG OR TABS
2.00 | ORAL_TABLET | Freq: Every evening | ORAL | Status: DC
Start: 2018-06-13 — End: 2018-06-18
  Administered 2018-06-13 – 2018-06-17 (×5): 17.2 mg via ORAL
  Filled 2018-06-13 (×5): qty 2

## 2018-06-13 MED ORDER — LEVOTHYROXINE SODIUM 125 MCG OR TABS
125.00 ug | ORAL_TABLET | Freq: Every day | ORAL | Status: DC
Start: 2018-06-14 — End: 2018-06-18
  Administered 2018-06-14 – 2018-06-18 (×5): 125 ug via ORAL
  Filled 2018-06-13 (×5): qty 1

## 2018-06-13 MED ORDER — OXYCODONE HCL 10 MG OR TABS
10.0000 mg | ORAL_TABLET | ORAL | Status: DC | PRN
Start: 2018-06-13 — End: 2018-06-13

## 2018-06-13 MED ORDER — HYDROCODONE-ACETAMINOPHEN 5-325 MG OR TABS
1.00 | ORAL_TABLET | ORAL | Status: DC | PRN
Start: 2018-06-13 — End: 2018-06-18
  Administered 2018-06-14 – 2018-06-18 (×3): 1 via ORAL
  Filled 2018-06-13 (×3): qty 1

## 2018-06-13 MED ORDER — HYDROCODONE-ACETAMINOPHEN 10-325 MG OR TABS
1.00 | ORAL_TABLET | ORAL | Status: DC | PRN
Start: 2018-06-13 — End: 2018-06-18

## 2018-06-13 MED ORDER — SODIUM CHLORIDE 0.9 % IJ SOLN (CUSTOM)
3.0000 mL | Freq: Three times a day (TID) | INTRAMUSCULAR | Status: DC
Start: 2018-06-13 — End: 2018-06-18
  Administered 2018-06-13 – 2018-06-16 (×9): 3 mL via INTRAVENOUS

## 2018-06-13 MED ORDER — METRONIDAZOLE IN NACL 5-0.79 MG/ML-% IV SOLN
500.00 mg | Freq: Three times a day (TID) | INTRAVENOUS | Status: DC
Start: 2018-06-13 — End: 2018-06-16
  Administered 2018-06-13 – 2018-06-16 (×8): 500 mg via INTRAVENOUS
  Filled 2018-06-13 (×8): qty 100

## 2018-06-13 MED ORDER — AMLODIPINE 5 MG OR TABS
5.00 mg | ORAL_TABLET | Freq: Every day | ORAL | Status: DC
Start: 2018-06-14 — End: 2018-06-18
  Administered 2018-06-14 – 2018-06-18 (×5): 5 mg via ORAL
  Filled 2018-06-13 (×5): qty 1

## 2018-06-13 MED ORDER — ACETAMINOPHEN 325 MG PO TABS
975.00 mg | ORAL_TABLET | Freq: Once | ORAL | Status: AC
Start: 2018-06-13 — End: 2018-06-13
  Administered 2018-06-13: 975 mg via ORAL
  Filled 2018-06-13: qty 3

## 2018-06-13 MED ORDER — GABAPENTIN 250 MG/5ML OR SOLN
300.00 mg | Freq: Three times a day (TID) | ORAL | Status: DC
Start: 2018-06-13 — End: 2018-06-14
  Administered 2018-06-13 – 2018-06-14 (×2): 300 mg via ORAL
  Filled 2018-06-13 (×3): qty 6

## 2018-06-13 MED ORDER — OXYCODONE HCL 5 MG OR TABS
5.00 mg | ORAL_TABLET | Freq: Once | ORAL | Status: DC
Start: 2018-06-13 — End: 2018-06-13
  Filled 2018-06-13: qty 1

## 2018-06-13 MED ORDER — MORPHINE SULFATE 2 MG/ML IJ SOLN
2.0000 mg | INTRAMUSCULAR | Status: AC | PRN
Start: 2018-06-13 — End: 2018-06-14

## 2018-06-13 MED ORDER — SODIUM CHLORIDE 0.9% TKO INFUSION
INTRAVENOUS | Status: DC | PRN
Start: 2018-06-13 — End: 2018-06-18

## 2018-06-13 MED ORDER — SODIUM CHLORIDE 0.9 % IJ SOLN (CUSTOM)
3.0000 mL | INTRAMUSCULAR | Status: DC | PRN
Start: 2018-06-13 — End: 2018-06-18

## 2018-06-13 MED ORDER — SODIUM CHLORIDE 0.9 % IV SOLN
2000.00 mg | INTRAVENOUS | Status: DC
Start: 2018-06-13 — End: 2018-06-16
  Administered 2018-06-13 – 2018-06-15 (×3): 2000 mg via INTRAVENOUS
  Filled 2018-06-13 (×3): qty 2000

## 2018-06-13 NOTE — Progress Notes (Signed)
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     Reports the pain began all of a sudden ~1 week ago, but denies any recent trauma, cuts, or caries. Says the pain and swelling have been gradually worsening and spreading up to her L nose, cheek, and eye. Tender to the touch. Denies any vision changes. Has had subjective fevers but no chills. Able to tolerate liquid po but difficult to eat solids. Reports L sided neck pain, headaches, lightheaded/dizziness but no LAD. Has not noticed any discharge or bleeding from her mouth. Denies any new discharge from her stoma. Has been coughing up some clear sputum, but denies SOB or CP, odynophagia, abdominal pain, hematochezia/melena, rashes, n/v.  Said she had been given clindamycin outpatient but has not finished her abx course. Reports she has allergy to penicillin - caused swelling of her lips and eyes. Per chart review she had been given cefazolin in the past and did not have an allergic reaction.     Reports L sided hemiparesis at baseline. Calls her left arm "Emily Ball" and left leg "Emily Ball". Does have intact sensation bilaterally. Reports swelling and mild neuropathic pain in both legs at baseline.       Past Medical/Surgical History     Past Medical History:   Diagnosis Date   . Cholangitis    . CVA (cerebral vascular accident) (CMS-HCC)    . Hypertension    . Left-sided muscle weakness    . Obesity    . Stroke (CMS-HCC)    . Uterine cancer (CMS-HCC)      Past Surgical History:   Procedure Laterality Date   . HYSTERECTOMY     . THYROIDECTOMY         Social History     Social History     Tobacco Use   . Smoking  status: Never Smoker   . Smokeless tobacco: Never Used   Substance Use Topics   . Alcohol use: No     Frequency: Never     Social History     Substance and Sexual Activity   Drug Use No     She    reports that she has never smoked. She has never used smokeless tobacco. She reports that she does not drink alcohol or use drugs.    Living/Occupation:  EtOH:  Smoking:  Illicit Drug Use:    Family History   No family history on file.  Immunization History   Administered Date(s) Administered   . Influenza Vaccine >=3 Years 07/14/2011, 07/28/2012       Medications     Prior to Admission Medications   Prescriptions Last Dose Informant Patient Reported? Taking?   HYDROcodone-acetaminophen (NORCO) 5-325 MG tablet   No No   Sig: Take 1 tablet by mouth every 6 hours as needed for Moderate Pain (Pain Score 4-6).   LORazepam (ATIVAN) 0.5 MG tablet   No No   Sig: Take 1 tablet (0.5 mg) by mouth 2 times daily as needed for Anxiety.   Syringe/Needle, Disp, (SYRINGE 3CC/23GX1") 23G X 1" 3 ML MISC  No No   Sig: Use as directed to administer naloxone   amLODIPINE (NORVASC) 2.5 MG tablet   No No   Sig: Take 1 tablet (2.5 mg) by mouth daily.   aspirin 81 MG chewable tablet   Yes No   Sig: Take 81 mg by mouth daily.   fluticasone propionate (FLONASE) 50 MCG/ACT nasal spray   Yes No   Sig: Spray 2 sprays into each nostril daily.   gabapentin (NEURONTIN) 300 MG capsule   No No   Sig: Take 1 capsule (300 mg) by mouth 3 times daily.   hydrocortisone 1 % ointment   Yes No   Sig: Apply 1 Application topically 4 times daily.   lansoprazole (PREVACID) 30 MG capsule   No No   Sig: Take 1 capsule (30 mg) by mouth 2 times daily.   levothyroxine (SYNTHROID) 125 MCG tablet   No No   Sig: Take 1 tablet (125 mcg) by mouth every morning (before breakfast). NEEDS LABS   magnesium citrate ([employer] CITRATE OF MAGNESIA) 1.745 GM/30ML SOLN   Yes No   Sig: Take 1 bottle by mouth daily as needed for Constipation.   meclizine (ANTIVERT) 25 MG tablet   No No   Sig:  Take 1 tablet (25 mg) by mouth every 8 hours as needed for Dizziness.   naloxone (NARCAN) 4 mg/0.1 mL nasal spray   No No   Sig: For suspected opioid overdose, spray once in each nostril. Repeat after 3 minutes if no or minimal response.   nitroGLYcerin (NITROSTAT) 0.4 MG SL tablet   Yes No   Sig: 0.5 mg by Sublingual route every 5 minutes as needed.   nystatin (NYSTOP) 100000 UNIT/GM powder   No No   Sig: Apply 1 Application topically 2 times daily.   omeprazole (PRILOSEC) 20 MG capsule   No No   Sig: Take 1 capsule (20 mg) by mouth 2 times daily.   opioid agreement   No No   Sig: opioid agreement   polyethylene glycol (GLYCOLAX) powder   No No   Sig: Take 17 g by mouth daily.   senna (SENOKOT) 8.6 MG tablet   No No   Sig: Take 1 tablet (8.6 mg) by mouth daily.   sertraline (ZOLOFT) 25 MG tablet   No No   Sig: Take 1 tablet (25 mg) by mouth daily.   vitamin B-12 (CYANOCOBALAMIN) 1000 MCG tablet   No No   Sig: Take 1 tablet (1,000 mcg) by mouth daily.      Facility-Administered Medications: None       Allergies     Allergies   Allergen Reactions   . Pcn [Penicillins] Anaphylaxis   . Strawberry Rash     Fresh strawberries per patient.    . Sulfa Drugs Rash   . Aspirin-Dipyridamole Unspecified     pruritus   . Nitrous Oxide Other     Gets paranoid from, given at dentist before   . Trazodone Other     Nightmares   . Zolpidem Unspecified     Doesn't help sleep and gets anxious       Physical Exam   Temperature:  [98.2 F (36.8 C)-100.8 F (38.2 C)] 98.2 F (36.8 C) (09/01 1837)  Blood pressure (BP): (157)/(57) 157/57 (09/01 1742)  Heart Rate:  [96] 96 (09/01 1742)  Respirations:  [20] 20 (09/01 1742)  Pain Score: 0 (09/01 1844)  O2 Device: None (Room air) (09/01 1742)  SpO2:  [96 %]  96 % (09/01 1742)  Wt Readings from Last 1 Encounters:   06/13/18 124 kg (273 lb 5.9 oz)     No intake/output data recorded.    General: NAD, sad/anxious affect  HEENT:  swelling of her L face from her lips to her L eye, tender to  palpation, no fluctuance or discharge noted, indurated around her L nostril, no nasal discharge, swelling belwo her eye, no erythema or rash, PERLLA, EOMI, no  Neck: Supple, trachea midline, no LAD, no JVD, stoma present, c/d/i, no purulent discharge noted  CV: RRR, S1/S2 normal, no r/m/g  Lungs: CTAB, no r/r/w, no increased wob  Abdomen: Soft, NTND, BS+, no HSM  Extremities: wwp, dp/pt 2+, no c/c/e  MSK/Back: No joint erythema, tenderness, effusions; no spinal/CVA tenderness  Skin: No obvious rashes, lesions, or jaundice  Neuro: AOx4, attentive, cooperative, CN II-XII intact, strenght 4/5 on RUE and RLE, 0/5 strength on LUE and LLE, sensation intact.   Psych: Sad mood/affect, no SI/HI      ?    Data Review     Labs    No results found for: WBC, HGB, HCT, PLT  No results found for: INR, PTT      Radiology/Procedure Results, personally reviewed:  Outside CT revealed peridontal abscess, dental carries, cellulitis    Consults  IP CONSULT TO HEAD & NECK SURGERY    Assessment and Plan   Ethelreda Sukhu is a 67 year old female with a hx of CVA with left hemiplegia, hypothyroidism 2/2 thyroidectomy c/b stoma, DM, GERD, presenting with 1 week of worsening L sided facial swelling and pain, CT revealing peridontal abscess and cellulitis.     #Peridontal abscess. Outside CT from [facility] showed peridontal abscess and cellulitis.   - consulted ENT for I&D, appreciate recs  - ceftriaxone 2gIV q24 and metronidazole 500 IV q8  - Trend CBC    #Chronic pain, for residual bone pain after stroke  - cont. home norco 5 q4 PRN for moderate pain (and rescue dose)   - norco10 q4 PRN for severe pain  - cont home gabapentin 300 tid    Chronic Issues   #Hypothyroidism  - cont home levothyroxine 125    #CAD  - cont home ASA 81    #HTN  - cont home amlodipine 5 daily    GERD  - lansoprazole 15      #Anxiety  - cont home sertraline 25 bid    #FEN: NPO at MN   #PPX: Lovenox    #Bowel: miralax, senna  #Access/Tubes/Drains:  #Code Status: DNAR-  FC/CC      Pt. seen and discussed with Juleen Starr, MD, attending physician on 06/13/2018, who agrees with the above plan.    Burns Spain, MD  PGY-1, Internal Medicine

## 2018-06-13 NOTE — Consults (Signed)
[facility]  Consult Note    Patient Name:  Emily Ball  MRN:    [id number]  Room#:   378/588F  Service:   [facility]  5    Requesting physician: Dr. 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    Endorses subjective fevers, L sided neck pain, headaches, lightheaded/dizziness. Tolerating liquid PO but pain with solids.     Denies trauma, dental caries. Pain and swelling progressively worsening and expanding to L nose, cheek, and eye. Denies vision changes, foul taste or oral discharge, dyspnea, dysphagia, odynophagia.     Prescribed clindamycin at OSH but has not completed course. History of potentially anaphylactic penicillin allergy previously resulting in labial, perioral, and periocular edema.    Past Medical History:   Diagnosis Date   . Cholangitis    . CVA (cerebral vascular accident) (CMS-HCC)    . Hypertension    . Left-sided muscle weakness    . Obesity    . Stroke (CMS-HCC)    . Uterine cancer (CMS-HCC)      Past Surgical History:   Procedure Laterality Date   . HYSTERECTOMY     . THYROIDECTOMY       Outpatient Meds  No current facility-administered medications on file prior to encounter.      Current Outpatient Medications on File Prior to Encounter   Medication Sig Dispense Refill   . amLODIPINE (NORVASC) 2.5 MG tablet Take 1 tablet (2.5 mg) by mouth daily. 30 tablet 3   . aspirin 81 MG chewable tablet Take 81 mg by mouth daily.     . fluticasone propionate (FLONASE) 50 MCG/ACT nasal spray Spray 2 sprays into each nostril daily.     Marland Kitchen gabapentin (NEURONTIN) 300 MG capsule Take 1 capsule (300 mg) by mouth 3 times daily. 90 capsule 3   .  HYDROcodone-acetaminophen (NORCO) 5-325 MG tablet Take 1 tablet by mouth every 6 hours as needed for Moderate Pain (Pain Score 4-6). 60 tablet 0   . hydrocortisone 1 % ointment Apply 1 Application topically 4 times daily.     . lansoprazole (PREVACID) 30 MG capsule Take 1 capsule (30 mg) by mouth 2 times daily. 30 capsule 3   . levothyroxine (SYNTHROID) 125 MCG tablet Take 1 tablet (125 mcg) by mouth every morning (before breakfast). NEEDS LABS 90 tablet 0   . LORazepam (ATIVAN) 0.5 MG tablet Take 1 tablet (0.5 mg) by mouth 2 times daily as needed for Anxiety. 60 tablet 0   . magnesium citrate (CVS CITRATE OF MAGNESIA) 1.745 GM/30ML SOLN Take 1 bottle by mouth daily as needed for Constipation.     . meclizine (ANTIVERT) 25 MG tablet Take 1 tablet (25 mg) by mouth every 8 hours as needed for Dizziness. 30 tablet 0   . naloxone (NARCAN) 4 mg/0.1 mL nasal spray For suspected opioid overdose, spray once in each nostril. Repeat after 3 minutes if no or minimal response. 2 bottle 3   . nitroGLYcerin (NITROSTAT) 0.4 MG SL tablet 0.5 mg by Sublingual route every 5 minutes as needed.     . nystatin (NYSTOP) 100000 UNIT/GM powder Apply 1 Application topically 2 times daily. [place]  g 3   . omeprazole (PRILOSEC) 20 MG capsule Take 1 capsule (20 mg) by mouth 2 times daily. 90 capsule 0   . opioid agreement opioid agreement 1 each 0   . polyethylene glycol (GLYCOLAX) powder Take 17 g by mouth daily. 255 g 0   . senna (SENOKOT) 8.6 MG tablet Take 1 tablet (8.6 mg) by mouth daily. 30 tablet 3   . sertraline (ZOLOFT) 25 MG tablet Take 1 tablet (25 mg) by mouth daily. 90 tablet 3   . Syringe/Needle, Disp, (SYRINGE 3CC/23GX1") 23G X 1" 3 ML MISC Use as directed to administer naloxone 50 each 0   . vitamin B-12 (CYANOCOBALAMIN) 1000 MCG tablet Take 1 tablet (1,000 mcg) by mouth daily. 30 tablet 3      Inpatient Meds  . acetaminophen  650 mg Q8H   . acetaminophen  975 mg Once   . [START ON 06/14/2018] enoxaparin  0.5 mg/kg Daily   . oxyCODONE   5 mg Once   . sodium chloride (PF)  3 mL Q8H     Pcn [penicillins]; Strawberry; Sulfa drugs; Aspirin-dipyridamole; Nitrous oxide; Trazodone; and Zolpidem  No family history on file.   Social History:   Tobacco: The patient reports that she has never smoked. She has never used smokeless tobacco.  Alcohol: The patient reports that she does not drink alcohol.  Drugs: The patient reports that she does not use drugs.    Review of Systems:   WNL, except as per HPI.    Physical Exam:  BP 157/57 (BP Location: Right arm, BP Patient Position: Semi-Fowlers)   Pulse 96   Temp 98.2 F (36.8 C)   Resp 20   Ht 5\' 9"  (1.753 m)   Wt 124 kg (273 lb 5.9 oz)   SpO2 96%   BMI 40.37 kg/m   General: Alert, appropriate, no acute distress  Neuro: CN II-XII grossly intact and symmetric  Pulmonary: Breathing non-labored, respirations inaudible, no stridor, voice normal  Nose: External exam normal, no epistaxis or crusts, nasal asymmetry resulting from Left midfacial soft tissue fullness.   -  ~3cm diameter tense fluctuant region in medial midface directly lateral to Left nasal ala  - Left superior labial edema   Oral cavity/oropharynx: Mucosa moist, decaying 1st left maxillary premolar, absent left maxillary second premolar, likely decaying 1st molar dentition. Buccal mucosa, floor of mouth, palate, oral tongue, retromolar trigone, and pharyngeal mucosa are free of suspicious mass or mucosal lesions.  Neck: Soft and flat with midline airway, no palpable masses    Incision and Drainage:  The indications risks benefits and alternatives of the needle aspiration followed by incision and drainage of the Left facial abscess were explained to the patient. Risks including bleeding, pain, infection, scarring, and needing further procedures.  The patient understood these risks and elected to proceed. Written consent was obtained and witnessed by the patient's RN. Time-out was performed and the patient identity was verified utilizing her  wristband and verbally with the patient and in the presence of the patient's RN. A total of 6cc of 1% lidocaine with 1/100,000 epinephrine was used to perform a block of her left infraorbital nerve and soft tissue peripheral to the abscess. A 20G needle was inserted into the fluctuant area and aspiration yielded milky white frankly purulent fluid. An 11 blade was then used to make a longitudinal incision was made over the fluid collections and a curved kelly clamp was used to bluntly dissect the premaxillary soft tissue resulting  in the expression of ~15cc of frank pus. Patient tolerated procedure without complication. Swab of pus sent for gram stain and aerobic and anaerobic cultures.    Imaging:  OSH CT max/face uploaded to [facility] - shows Left premaxillary hypodensity with rim enhancement    Assessment:  This is a 67 year old female with Left medial midfacial abscess, likely odontogenic, status post uncomplicated bedside incision and drainage.    Recommendations:    - defer antibiotic selection to primary team  - follow up culture results  - peridex swish and spit BID for 5 days  - full liquid diet, advance to mechanical soft as tolerated  - meticulous oral care, with oral rinses after meals  - follow up with dentist after discharge    Thank you for involving [place] in the care of this patient. We will continue to follow. Please page if further questions or concerns arise.    Patient discussed with chief, Dr. Minna Antis.    Jene Every, MD, Ph.D  Resident Physician, PGY-2  Otolaryngology-Head & Neck Surgery  [facility] pager:  [phone]  [facility] pager:  [facility] pager:  [phone]    --  Primary Service: [place]  5  Treatment Team:   Attending Provider: Juleen Starr, MD  1st Call: Burns Spain, MD

## 2018-06-13 NOTE — Interdisciplinary (Signed)
MD at bedside doing I/D of left facial abscess.

## 2018-06-13 NOTE — Interdisciplinary (Signed)
1150 paged regarding admission orders. MD notified regarding pt's temp (100.8). Wound photos taken of left axillary, sacrum/buttocks, and left abdomen (inner fold).

## 2018-06-13 NOTE — Progress Notes (Signed)
[facility] TRANSFER ACCEPT NOTE    Accept Date: 06/13/18    Referring Provider Name: Dr. Jerel Shepherd  Referring Provider Contact #: [phone]     Referring Facility/Clinic: [facility]    Dx: facial cellulitis + abscess  Reason for Admit/Transfer: [place] patient + needs abscess drainage  Level of Care: Med/Surg    Brief History: 67 yo F w/h/o HTN, prior CVA with residual deficits presented to ER facial swelling and pain. CT revealed peridontal abscess, dental carries, cellulitis.    Vitals stable, non-toxic appearing, kidney function normal. [place] administered clindamycin    Special considerations: None  Consult Services Required (specify): OMFS vs ENT to drain abscess    To Do: drain abscess      When patient arrives on the unit at [place]:  CONTACT: [facility] Triage Pager ([phone]) [place].

## 2018-06-13 NOTE — Interdisciplinary (Signed)
[id number] is sending a message to: Burns Spain / [phone]       RE: Emily Ball reports she doesn't take oxycodone. She takes Norco. Thank you!

## 2018-06-14 LAB — CBC WITH DIFF, BLOOD
ANC-Automated: 5.8 10*3/uL (ref 1.6–7.0)
Abs Basophils: 0 10*3/uL (ref <0.1)
Abs Eosinophils: 0 10*3/uL (ref 0.1–0.5)
Abs Lymphs: 2.1 10*3/uL (ref 0.8–3.1)
Abs Monos: 0.8 10*3/uL (ref 0.2–0.8)
Basophils: 0 %
Eosinophils: 0 %
Hct: 37.7 % (ref 34.0–45.0)
Hgb: 12 gm/dL (ref 11.2–15.7)
Lymphocytes: 24 %
MCH: 31 pg (ref 26.0–32.0)
MCHC: 31.8 g/dL — ABNORMAL LOW (ref 32.0–36.0)
MCV: 97.4 um3 — ABNORMAL HIGH (ref 79.0–95.0)
MPV: 10.9 fL (ref 9.4–12.4)
Monocytes: 10 %
Plt Count: 273 10*3/uL (ref 140–370)
RBC: 3.87 10*6/uL — ABNORMAL LOW (ref 3.90–5.20)
RDW: 15.2 % — ABNORMAL HIGH (ref 12.0–14.0)
Segs: 66 %
WBC: 8.7 10*3/uL (ref 4.0–10.0)

## 2018-06-14 LAB — BASIC METABOLIC PANEL, BLOOD
Anion Gap: 12 mmol/L (ref 7–15)
BUN: 10 mg/dL (ref 8–23)
Bicarbonate: 28 mmol/L (ref 22–29)
Calcium: 9.2 mg/dL (ref 8.5–10.6)
Chloride: 99 mmol/L (ref 98–107)
Creatinine: 0.49 mg/dL — ABNORMAL LOW (ref 0.51–0.95)
GFR: >60 mL/min
Glucose: 77 mg/dL (ref 70–99)
Potassium: 3.2 mmol/L — ABNORMAL LOW (ref 3.5–5.1)
Sodium: 139 mmol/L (ref 136–145)

## 2018-06-14 LAB — C-REACTIVE PROTEIN, BLOOD: CRP: 17.3 mg/dL — ABNORMAL HIGH (ref <0.5)

## 2018-06-14 LAB — PHOSPHORUS, BLOOD: Phosphorous: 3.5 mg/dL (ref 2.7–4.5)

## 2018-06-14 LAB — MAGNESIUM, BLOOD: Magnesium: 1.8 mg/dL (ref 1.6–2.4)

## 2018-06-14 MED ORDER — SERTRALINE HCL 25 MG OR TABS
25.00 mg | ORAL_TABLET | Freq: Every day | ORAL | Status: DC
Start: 2018-06-15 — End: 2018-06-18
  Administered 2018-06-15 – 2018-06-18 (×4): 25 mg via ORAL
  Filled 2018-06-14 (×4): qty 1

## 2018-06-14 MED ORDER — GABAPENTIN 300 MG OR CAPS
300.00 mg | ORAL_CAPSULE | Freq: Three times a day (TID) | ORAL | Status: DC
Start: 2018-06-14 — End: 2018-06-18
  Administered 2018-06-14 – 2018-06-18 (×13): 300 mg via ORAL
  Filled 2018-06-14 (×13): qty 1

## 2018-06-14 NOTE — Plan of Care (Signed)
Problem: Promotion of Health and Safety  Goal: Promotion of Health and Safety  Description  The patient remains safe, receives appropriate treatment and achieves optimal outcomes (physically, psychosocially, and spiritually) within the limitations of the disease process by discharge.    Information below is the current care plan.  Outcome: Progressing  Flowsheets  Taken 06/13/2018 2000  Patient /Family stated Goal: "Get this procedure over with"   Taken 06/14/2018 0500  Guidelines: Inpatient Nursing Guidelines  Individualized Interventions/Recommendations #1: Hold patient's hand during procedure to provide comfort. Provide emotional support.  Individualized Interventions/Recommendations #2 (if applicable): Encourage patient to allow repositioning to reduce pressure.  Individualized Interventions/Recommendations #3 (if applicable): Cluster care to promote rest. Turn off lights and keep curtains drawn for privacy.  Outcome Evaluation (rationale for progressing/not progressing) every shift: Progressing. Pt reports no pain after procedure. Pt refuses q2hr turns. Pt resting comfortably.

## 2018-06-14 NOTE — Interdisciplinary (Signed)
Pt Emily Ball, regarding Sed rate, phlebotomist had a hard time drawing blood from this patient. We only have 1 phlebotomist today. Can you order Sed rate for the am labs please, thank you.

## 2018-06-14 NOTE — H&P (Signed)
Admission History & Physical   Patient: Emily Ball, Emily Ball 09/21/1951, [id number]   Date of face to face patient encounter: 06/13/2018       Chief Complaint   Peridontal abcess      History of Present Illness   Emily Ball is a 67 year old female with a hx of CVA with left hemiplegia, hypothyroidism 2/2 thyroidectomy c/b stoma, DM, GERD, presenting with 1 week of worsening L sided facial swelling and pain.     Reports the pain began all of a sudden ~1 week ago, but denies any recent trauma, cuts, or caries. Says the pain and swelling have been gradually worsening and spreading up to her L nose, cheek, and eye. Tender to the touch. Denies any vision changes. Has had subjective fevers but no chills. Able to tolerate liquid po but difficult to eat solids. Reports L sided neck pain, headaches, lightheaded/dizziness but no LAD. Has not noticed any discharge or bleeding from her mouth. Denies any new discharge from her stoma. Has been coughing up some clear sputum, but denies SOB or CP, odynophagia, abdominal pain, hematochezia/melena, rashes, n/v.  Said she had been given clindamycin outpatient but has not finished her abx course. Reports she has allergy to penicillin - caused swelling of her lips and eyes. Per chart review she had been given cefazolin in the past and did not have an allergic reaction.     Reports L sided hemiparesis at baseline. Calls her left arm "Wilma" and left leg "Fred". Does have intact sensation bilaterally. Reports swelling and mild neuropathic pain in both legs at baseline.       Past Medical/Surgical History     Past Medical History:   Diagnosis Date   . Cholangitis    . CVA (cerebral vascular accident) (CMS-HCC)    . Hypertension    . Left-sided muscle weakness    . Obesity    . Stroke (CMS-HCC)    . Uterine cancer (CMS-HCC)      Past Surgical History:   Procedure Laterality Date   . HYSTERECTOMY     . THYROIDECTOMY         Social History     Social History     Tobacco Use   . Smoking  status: Never Smoker   . Smokeless tobacco: Never Used   Substance Use Topics   . Alcohol use: No     Frequency: Never     Social History     Substance and Sexual Activity   Drug Use No     She    reports that she has never smoked. She has never used smokeless tobacco. She reports that she does not drink alcohol or use drugs.    Living/Occupation:  EtOH:  Smoking:  Illicit Drug Use:    Family History   No family history on file.  Immunization History   Administered Date(s) Administered   . Influenza Vaccine >=6 Months 07/14/2011, 07/28/2012       Medications     Prior to Admission Medications   Prescriptions Last Dose Informant Patient Reported? Taking?   HYDROcodone-acetaminophen (NORCO) 5-325 MG tablet   No No   Sig: Take 1 tablet by mouth every 6 hours as needed for Moderate Pain (Pain Score 4-6).   LORazepam (ATIVAN) 0.5 MG tablet   No No   Sig: Take 1 tablet (0.5 mg) by mouth 2 times daily as needed for Anxiety.   Syringe/Needle, Disp, (SYRINGE 3CC/23GX1") 23G X 1" 3 ML MISC  No No   Sig: Use as directed to administer naloxone   amLODIPINE (NORVASC) 2.5 MG tablet   No No   Sig: Take 1 tablet (2.5 mg) by mouth daily.   aspirin 81 MG chewable tablet   Yes No   Sig: Take 81 mg by mouth daily.   fluticasone propionate (FLONASE) 50 MCG/ACT nasal spray   Yes No   Sig: Spray 2 sprays into each nostril daily.   gabapentin (NEURONTIN) 300 MG capsule   No No   Sig: Take 1 capsule (300 mg) by mouth 3 times daily.   hydrocortisone 1 % ointment   Yes No   Sig: Apply 1 Application topically 4 times daily.   lansoprazole (PREVACID) 30 MG capsule   No No   Sig: Take 1 capsule (30 mg) by mouth 2 times daily.   levothyroxine (SYNTHROID) 125 MCG tablet   No No   Sig: Take 1 tablet (125 mcg) by mouth every morning (before breakfast). NEEDS LABS   magnesium citrate (CVS CITRATE OF MAGNESIA) 1.745 GM/30ML SOLN   Yes No   Sig: Take 1 bottle by mouth daily as needed for Constipation.   meclizine (ANTIVERT) 25 MG tablet   No No   Sig:  Take 1 tablet (25 mg) by mouth every 8 hours as needed for Dizziness.   naloxone (NARCAN) 4 mg/0.1 mL nasal spray   No No   Sig: For suspected opioid overdose, spray once in each nostril. Repeat after 3 minutes if no or minimal response.   nitroGLYcerin (NITROSTAT) 0.4 MG SL tablet   Yes No   Sig: 0.5 mg by Sublingual route every 5 minutes as needed.   nystatin (NYSTOP) 100000 UNIT/GM powder   No No   Sig: Apply 1 Application topically 2 times daily.   omeprazole (PRILOSEC) 20 MG capsule   No No   Sig: Take 1 capsule (20 mg) by mouth 2 times daily.   opioid agreement   No No   Sig: opioid agreement   polyethylene glycol (GLYCOLAX) powder   No No   Sig: Take 17 g by mouth daily.   senna (SENOKOT) 8.6 MG tablet   No No   Sig: Take 1 tablet (8.6 mg) by mouth daily.   sertraline (ZOLOFT) 25 MG tablet   No No   Sig: Take 1 tablet (25 mg) by mouth daily.   vitamin B-12 (CYANOCOBALAMIN) 1000 MCG tablet   No No   Sig: Take 1 tablet (1,000 mcg) by mouth daily.      Facility-Administered Medications: None       Allergies     Allergies   Allergen Reactions   . Pcn [Penicillins] Anaphylaxis   . Strawberry Rash     Fresh strawberries per patient.    . Sulfa Drugs Rash   . Aspirin-Dipyridamole Unspecified     pruritus   . Nitrous Oxide Other     Gets paranoid from, given at dentist before   . Trazodone Other     Nightmares   . Zolpidem Unspecified     Doesn't help sleep and gets anxious       Physical Exam   Temperature:  [97.8 F (36.6 C)-100.8 F (38.2 C)] 98 F (36.7 C) (09/02 1032)  Blood pressure (BP): (122-157)/(45-75) 124/50 (09/02 1032)  Heart Rate:  [66-96] 71 (09/02 1032)  Respirations:  [18-20] 18 (09/02 1032)  Pain Score: 0 (09/02 1032)  O2 Device: None (Room air) (09/02 1032)  SpO2:  [94 %-99 %]  99 % (09/02 1032)  Wt Readings from Last 1 Encounters:   06/13/18 124 kg (273 lb 5.9 oz)     09/01 0600 - 09/02 0559  In: 356 [P.O.:300; I.V.:56]  Out: -     General: NAD, sad/anxious affect  HEENT:  swelling of her L face  from her lips to her L eye, tender to palpation, no fluctuance or discharge noted, indurated around her L nostril, no nasal discharge, swelling belwo her eye, no erythema or rash, PERLLA, EOMI, no  Neck: Supple, trachea midline, no LAD, no JVD, stoma present, c/d/i, no purulent discharge noted  CV: RRR, S1/S2 normal, no r/m/g  Lungs: CTAB, no r/r/w, no increased wob  Abdomen: Soft, NTND, BS+, no HSM  Extremities: wwp, dp/pt 2+, no c/c/e  MSK/Back: No joint erythema, tenderness, effusions; no spinal/CVA tenderness  Skin: No obvious rashes, lesions, or jaundice  Neuro: AOx4, attentive, cooperative, CN II-XII intact, strenght 4/5 on RUE and RLE, 0/5 strength on LUE and LLE, sensation intact.   Psych: Sad mood/affect, no SI/HI      ?    Data Review     Labs    Lab Results   Component Value Date    WBC 8.7 06/14/2018    HGB 12.0 06/14/2018    HCT 37.7 06/14/2018    PLT 273 06/14/2018     No results found for: INR, PTT      Radiology/Procedure Results, personally reviewed:  Outside CT revealed peridontal abscess, dental carries, cellulitis    Consults  IP CONSULT TO HEAD & NECK SURGERY    Assessment and Plan   Emily Ball is a 67 year old female with a hx of CVA with left hemiplegia, hypothyroidism 2/2 thyroidectomy c/b stoma, DM, GERD, presenting with 1 week of worsening L sided facial swelling and pain, CT revealing peridontal abscess and cellulitis.     #Peridontal abscess. Outside CT from [place] showed peridontal abscess and cellulitis.   - consulted ENT for I&D, appreciate recs  - ceftriaxone 2gIV q24 and metronidazole 500 IV q8  - Trend CBC    #Chronic pain, for residual bone pain after stroke  - cont. home norco 5 q4 PRN for moderate pain (and rescue dose)   - norco10 q4 PRN for severe pain  - cont home gabapentin 300 tid    Chronic Issues   #Hypothyroidism  - cont home levothyroxine 125    #CAD  - cont home ASA 81    #HTN  - cont home amlodipine 5 daily    GERD  - lansoprazole 15      #Anxiety  - cont home  sertraline 25 bid    #FEN: NPO at MN   #PPX: Lovenox    #Bowel: miralax, senna  #Access/Tubes/Drains:  #Code Status: DNAR- FC/CC      Pt. seen and discussed with Juleen Starr, MD, attending physician on 06/13/2018, who agrees with the above plan.    Burns Spain, MD  PGY-1, Internal Medicine

## 2018-06-14 NOTE — Plan of Care (Signed)
Problem: Promotion of Health and Safety  Goal: Promotion of Health and Safety  Description  The patient remains safe, receives appropriate treatment and achieves optimal outcomes (physically, psychosocially, and spiritually) within the limitations of the disease process by discharge.    Information below is the current care plan.  06/14/2018 1348 by Oretha Ellis., RN  Outcome: Progressing  Flowsheets  Taken 06/14/2018 1253 by Oretha Ellis., RN  Patient /Family stated Goal: " To feel better and get more rest"  Taken 06/14/2018 1348 by Oretha Ellis., RN  Individualized Interventions/Recommendations #1: Call light and personal belongings within reach  Individualized Interventions/Recommendations #3 (if applicable): Cluster care to promote rest and healing  Individualized Interventions/Recommendations #4 (if applicable): Assess pain and evaluate PRN administered  Individualized Interventions/Recommendations #5 (if applicable): Administer IV antibiotics and monitor adverse reaction  Outcome Evaluation (rationale for progressing/not progressing) every shift: Patient V/S stable. Purewick in use. L facial remains swollen but improving. Will continue with the plan of care.Still on IV antibiotics. DC pending clinical improvement.  Taken 06/14/2018 0500 by Alda Berthold, RN  Individualized Interventions/Recommendations #2 (if applicable): Encourage patient to allow repositioning to reduce pressure.  06/14/2018 1253 by Oretha Ellis., RN  Outcome: Progressing  Flowsheets (Taken 06/14/2018 1253)  Patient /Family stated Goal: " To feel better and get more rest"

## 2018-06-14 NOTE — Interdisciplinary (Addendum)
06/14/18 1407   Initial Assessment   CM Initial Assessment Completed   Patient Information   Where was the patient admitted from? Transfer from [facility]  (transferred [facility] for facial cellulitis + abscess)   Prior to Level of Function Needed Total Assist with Mobility   Assistive Device Wheelchair;Hospital bed;Other (Comment)  (trapeze; hoyer lift , motorized wheelchair,)   Prior [employer] Other (Comment);Transportation  ([facility] for bariatric bls transport )   Primary Caretaker(s) Family;Alone   Primary Contact Name, Number and Relationship Sister-in-law - Malva Limes- [phone] (OK to leave voicemail)   Permission to [facility]  (social secutiry )   Museum/gallery curator Resources Other (Comment)  ([place] [phone])   [place] Affiliation No   Discharge Planning   Living Arrangements Alone   Available Assistance/Support System Family member(s);Other (See comment)  (caregiver)   Type of Residence Private Residence  (ramp to enter)   [place] Yes  (Private caregiver 2x/day; 5days/week)   Additional Services Not Applicable   Barriers to Discharge Awaiting clinical improvement   Patient/Family/Other Engaged in Discharge Planning Yes   Name, Relationship and Phone Number of Person Engaged in the Discharge Plan self   Patient Has Decision Making Capacity Yes  (A&Ox3)   Patient/Family/Legal/Surrogate Decision Maker Has Been Given Options And Choice In The Selection of Post-Acute Care Providers Not Applicable  (anticipating discharge home. Vehemently declines SNF,stated "i'd rather die than go back")   [place] Clearance Needed Not Applicable   Social Worker Consult   Do you need to see a Education officer, museum?  No   Readmission Risk Assessment   Readmission Within 30 Days of Discharge No   Admission Was Unplanned   Patient Explanation  Not Applicable   Family Explanation Not  Applicable   Recent Hospitalizations (Within Last 6 Months) No   High Risk For Readmission Yes   High Risk Indicators Impaired mobility;Anticipated long term health care needs (e.g. new diabetic, CHF, Stroke, MI, etc.)     Patient assessed at bedside : A&Ox3   PCP - Dr Everardo Beals  Pharmacy - [facility] (mail order)  Previous SNF - [facility] Declines any SNF  [facility]  [facility] bed with trapeze (from [place]); Mattress (from [employer]); Software engineer lift     Patient unable to use hoyer lift as caregiver unable to operate lift; patient is often bed bound as cannot be lifted out of bed and onto wheelchair.   Per patient - no pressure ulcers- she is able to turn self to one direction only (left only) as left hand lost function from previous stroke.  No contractures to Left hand as patient continues to exercise.     Message sent to [place], Maryan Rued re: complex/outpatient case management.   Treatment team 1st call notified re: anticipated need for home health, air matress and incontinence supplies upon discharge.     D/C dispo: anticipating discharge home pending clinical improvement. Patient will need bariatric BLS transport for discharge. Often uses [facility] ambulance.   Case management services to continue for discharge planning

## 2018-06-14 NOTE — Progress Notes (Signed)
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 Now with left premaxillary abscess. S/p bedside I&D 06/13/18.    Overnight/Subjective:  Afebrile. VSS  Still with significant pain, but improving with abx    Diet:  Diet Mechanical Soft Texture; Carbohydrate Limited    Labs:  Lab Results   Component Value Date    WBC 8.7 06/14/2018    HGB 12.0 06/14/2018    HCT 37.7 06/14/2018    PLT 273 06/14/2018     No results found for: INR, PTT  Lab Results   Component Value Date    NA 139 06/14/2018    K 3.2 (L) 06/14/2018    CL 99 06/14/2018    BICARB 28 06/14/2018    BUN 10 06/14/2018    CREAT 0.49 (L) 06/14/2018    GLU 77 06/14/2018    [place] 9.2 06/14/2018    MG 1.8 06/14/2018    PHOS 3.5 06/14/2018     Micro:   Culture (06/13/18): pending    Current Facility-Administered Medications   Medication Dose Frequency   . cefTRIAXone (ROCEPHIN) IV  2,000 mg Q24H NR   . metroNIDAZOLE  500 mg Q8H       Exam  Temperature:  [97.8 F (36.6 C)-100.8 F (38.2 C)] 98 F (36.7 C) (09/02 1032)  Blood pressure (BP): (122-157)/(45-75) 124/50 (09/02 1032)  Heart Rate:  [66-96] 71 (09/02 1032)  Respirations:  [18-20] 18 (09/02 1032)  Pain Score: 0 (09/02 1032)  O2 Device: None (Room air) (09/02 1032)  SpO2:  [94 %-99 %] 99 % (09/02 1032)  NAD, alert and appropriately conversant  No focal neurologic deficits, Mild numbness over left maxillary region  Mild left midface facial swelling and asymmetry 2/2 fullness, tender to palpation  No residual fluctuance or abscess s/p I&D  Poor dentition  Neck soft, tracheocutaneous fistula present without drainage or erythema     Imaging: no new imaging    Assessment & Plan:  67 year old female with Left medial  midfacial abscess, likely odontogenic, status post uncomplicated bedside incision and drainage.    - No evidence of residual abscess on today's exam  - Continue antibiotics, peridex mouth rinses BID  - F/u anaerobic cultures  - Recommend meticulous oral care with oral rinses after meals  - Should follow up with dentist after discharge  - Can follow up with ENT prn     Dispo: No barriers to discharge from ENT standpoint     Seen and discussed with Chief, Dr. Minna Antis. To be discussed with Attending.    Brynda Peon Rosana Hoes, MD  Resident Physician, PGY1  [facility]    Personal pager:              [phone]  [facility] Service pager:  [place] [facility] pager:   [phone]

## 2018-06-14 NOTE — Interdisciplinary (Signed)
Acknowledged Nursing Chewing Trigger  Pt with care team x 2 attempts. Charted per Epic.     Pt requires texture modification, MD started Mechanical Soft Textures 9/2. Pt consumed 49% x 2 meals.   Tray Items Taken for the past 168 hrs:   Number of Items Taken Number of Items on Tray Diet Tolerance   06/14/18 0830 1.5 4 Tolerates   06/14/18 1334 3 5 Tolerates       Augustina Mood, DTR    Acknowledged RN Admission Summary Weight Loss Trigger:    Weights (last 14 days)     Date/Time Weight Weight Source Percentage Weight Change (%) Who    06/13/18 1746  124 kg (273 lb 5.9 oz)  Bed scale  0 % CG            Wt Readings from Last 20 Encounters:   06/13/18 124 kg (273 lb 5.9 oz)   11/20/17 145.2 kg (320 lb)   07/24/17 133.7 kg (294 lb 12.1 oz)   06/02/17 113.4 kg (250 lb)     UBW: 298# Per photo ID (issued 2007)  % Weight Loss from current weight:    Based on photo ID, -25# x unknown timeframe (8%)  Time Frame of Weight loss: unknown    Data shows wt loss -47# x 1.5 years (14.6%)    Relayed findings to RD via EMR. Will continue to f/u per policy.   Augustina Mood, DTR

## 2018-06-14 NOTE — Progress Notes (Signed)
[facility] day:   1 day - Admitted on: 06/13/2018    Emily Ball is a 67 year old female with a hx of CVA with left hemiplegia, hypothyroidism 2/2 thyroidectomy c/b stoma, DM, GERD, presenting with 1 week of worsening L sided facial swelling and pain, s/p I&D of L medial midfacial abscess on 9/1.     24 Hour - Interval Events   - s/p I&D of L peridontal abscess on 9/1    Subjective   Feels much better this am. No longer has any pain and reports the facial swelling has decreased. No fevers/chills/nightsweats, dysphagia, odynophagia, n/v, vision changes, nasal discharge. No drainage or bleeding from the incision site. No productive cough. No new discharge from stoma.     ROS:      Physical Exam   Temp  Min: 97.8 F (36.6 C)  Max: 100.8 F (38.2 C)  Pulse  Min: 66  Max: 96  BP  Min: 122/45  Max: 157/57  Resp  Min: 18  Max: 20  SpO2  Min: 94 %  Max: 99 %    BP 124/50 (BP Location: Right arm, BP Patient Position: Semi-Fowlers)   Pulse 71   Temp 98 F (36.7 C)   Resp 18   Ht 5' 9" (1.753 m)   Wt 124 kg (273 lb 5.9 oz)   SpO2 99%   BMI 40.37 kg/m  O2 Device: None (Room air)      09/01 0600 - 09/02 0559  In: 356 [P.O.:300; I.V.:56]  Out: -   Urine x 1 Stool x 0 Emesis x 0       General: NAD, sitting comfortably in bed  HEENT:  swelling of upper L lip, swelling of L face from lips to cheek, improved from yesterday, some induration noted on upper L cheek near nasolabial fold, no fluctuance, drainage, or bleeding noted from I&D site, NTTP, poor dentition, PERLLA, EOMI,  Neck: Supple, trachea midline, no LAD, no JVD, stoma present, c/d/i, no purulent discharge noted  CV: RRR, S1/S2 normal, no r/m/g  Lungs: CTAB, no r/r/w, no increased wob  Abdomen: Soft, NTND, BS+, no HSM  Extremities: wwp, dp/pt 2+, no c/c/e  MSK/Back: No joint erythema, tenderness, effusions; no spinal/CVA tenderness  Skin: No obvious rashes, lesions, or jaundice  Neuro: AOx4, attentive, cooperative, CN II-XII  intact, strenght 4/5 on RUE and RLE, 0/5 strength on LUE and LLE, sensation intact.   Psych: Normal mood/affect, no SI/HI    Pertinent Labs and Imaging and Meds       WBC 8.7 (09/02) HGB 12.0 (09/02) PLT 273 (09/02)    HCT 37.7 (09/02)      %Neutrophils 66 (09/02) %Bands   %Lymphs 24 (09/02) %Monos 10 (09/02) %Eos 0 (09/02) %Basos 0 (09/02)  Na 139 (09/02) CL 99 (09/02) BUN 10 (09/02) GLU   77 (09/02)   K 3.2* (09/02) CO2 28 (09/02) Cr 0.49* (09/02)        Assessment / Plan   Emily Ball is a 67 year old female with a hx of CVA with left hemiplegia, hypothyroidism 2/2 thyroidectomy c/b stoma, DM, GERD, presenting with 1 week of worsening L sided facial swelling and pain, s/p I&D of L peridontal abscess on 9/1.     #Peridontal abscess s/p I&D. Outside CT from [place] showed peridontal abscess and cellulitis, with erosion of bone in setting of dental caries, now c/f osteomyelitis.   - ENT following,  appreciate recs  - cont ceftriaxone 2gIV q24 and metronidazole 500 IV q8  - peridex swish and spit bid for 5 days (9/1-9/6)  - f/u culture results  - f/u ESR + CRP due to c/f osteo    Chronic Issues   #Chronic pain, for residual bone pain after stroke  - cont. home norco 5 q4 PRN for moderate pain (and rescue dose)   - norco10 q4 PRN for severe pain  - cont home gabapentin 300 tid    #Hypothyroidism  - cont home levothyroxine 125    #CAD  - cont home ASA 81    #HTN  - cont home amlodipine 5 daily    GERD  - lansoprazole 15      #Anxiety  - cont home sertraline 25      Discharge planning: clinical improvement  Foley: No foley catheter  VTE prophylaxis: Lovenoox  Diet: Diet Mechanical Soft Texture; Carbohydrate Limited  Last BM:  Miralax, sena  IV fluids: No IV fluids  Access: PIVs  Code status: DNAR - Full Care    Patient care was discussed in detail with Vuong, Nhan D, MD.

## 2018-06-14 NOTE — Interdisciplinary (Signed)
[id number] is sending a message to: Macarthur Critchley DOUD / [phone]     RE: Emily Ball, lab says specimen only good for 5 hours. Unable to add lab add on sed rate.

## 2018-06-15 LAB — CBC WITH DIFF, BLOOD
ANC-Automated: 3.2 10*3/uL (ref 1.6–7.0)
Abs Basophils: 0 10*3/uL (ref <0.1)
Abs Eosinophils: 0.1 10*3/uL (ref 0.1–0.5)
Abs Lymphs: 1.9 10*3/uL (ref 0.8–3.1)
Abs Monos: 0.5 10*3/uL (ref 0.2–0.8)
Basophils: 0 %
Eosinophils: 1 %
Hct: 36.2 % (ref 34.0–45.0)
Hgb: 11.4 gm/dL (ref 11.2–15.7)
Lymphocytes: 34 %
MCH: 30.7 pg (ref 26.0–32.0)
MCHC: 31.5 g/dL — ABNORMAL LOW (ref 32.0–36.0)
MCV: 97.6 um3 — ABNORMAL HIGH (ref 79.0–95.0)
MPV: 10.8 fL (ref 9.4–12.4)
Monocytes: 8 %
Plt Count: 285 10*3/uL (ref 140–370)
RBC: 3.71 10*6/uL — ABNORMAL LOW (ref 3.90–5.20)
RDW: 15.2 % — ABNORMAL HIGH (ref 12.0–14.0)
Segs: 56 %
WBC: 5.7 10*3/uL (ref 4.0–10.0)

## 2018-06-15 LAB — MRSA SURVEILLANCE CULTURE

## 2018-06-15 LAB — BASIC METABOLIC PANEL, BLOOD
Anion Gap: 11 mmol/L (ref 7–15)
BUN: 15 mg/dL (ref 8–23)
Bicarbonate: 29 mmol/L (ref 22–29)
Calcium: 9 mg/dL (ref 8.5–10.6)
Chloride: 100 mmol/L (ref 98–107)
Creatinine: 0.56 mg/dL (ref 0.51–0.95)
GFR: >60 mL/min
Glucose: 87 mg/dL (ref 70–99)
Potassium: 3.3 mmol/L — ABNORMAL LOW (ref 3.5–5.1)
Sodium: 140 mmol/L (ref 136–145)

## 2018-06-15 LAB — MAGNESIUM, BLOOD: Magnesium: 1.9 mg/dL (ref 1.6–2.4)

## 2018-06-15 LAB — SED RATE, BLOOD: Sed Rate: 90 mm/hr — ABNORMAL HIGH (ref 0–30)

## 2018-06-15 LAB — PHOSPHORUS, BLOOD: Phosphorous: 3.7 mg/dL (ref 2.7–4.5)

## 2018-06-15 MED ORDER — POTASSIUM CHLORIDE CRYS CR 10 MEQ OR TBCR
30.00 meq | EXTENDED_RELEASE_TABLET | Freq: Two times a day (BID) | ORAL | Status: AC
Start: 2018-06-15 — End: 2018-06-15
  Administered 2018-06-15 (×2): 30 meq via ORAL
  Filled 2018-06-15 (×2): qty 1

## 2018-06-15 NOTE — Progress Notes (Signed)
[facility] day:   2 days - Admitted on: 06/13/2018    Anai Lipson is a 67 year old female with a hx of CVA with left hemiplegia, hypothyroidism 2/2 thyroidectomy c/b stoma, DM, GERD, presenting L medial midfacial abscess, s/p I&D on 9/1    24 Hour - Interval Events   - Gram stain culture growing gram + cocci and gram - bacilli with PMNs    Subjective   - Feels well this morning. Reports mild pain around her lips/nose and close to her L ear and neck, but believes swelling has decreased. No fevers/chills/nightsweats, dysphagia, odynophagia, n/v, vision changes, nasal discharge. No headache or lightheadedness/dizziness.   - No drainage or bleeding from the incision site. No productive cough. No new discharge from stoma.     ROS:      Physical Exam   Temp  Min: 97.7 F (36.5 C)  Max: 98.5 F (36.9 C)  Pulse  Min: 71  Max: 82  BP  Min: 109/55  Max: 129/55  Resp  Min: 16  Max: 18  SpO2  Min: 97 %  Max: 99 %    BP 117/57 (BP Location: Right arm, BP Patient Position: Semi-Fowlers)   Pulse 73   Temp 98.3 F (36.8 C)   Resp 18   Ht '5\' 9"'$  (1.753 m)   Wt 124 kg (273 lb 5.9 oz)   SpO2 97%   BMI 40.37 kg/m  O2 Device: None (Room air)      09/02 0600 - 09/03 0559  In: 728 [P.O.:672; I.V.:56]  Out: 750 [Urine:750]  Urine x 1 Stool x 1 Emesis x 0       General: NAD, sitting comfortably in bed  HEENT:  swelling of upper L lip, swelling of L lips, improved from yesterday, some induration noted on upper L cheek near nasolabial fold, no fluctuance, drainage, or bleeding noted from I&D site, mildly tender to palpation, poor dentition, PERLL, EOMI,  Neck: Supple, trachea midline, no LAD, no JVD, stoma present, c/d/i, no purulent discharge noted  CV: RRR, S1/S2 normal, no r/m/g  Lungs: CTAB, no r/r/w, no increased wob  Abdomen: Soft, NTND, BS+, no HSM  Extremities: wwp, dp/pt 2+, no c/c/e  MSK/Back: No joint erythema, tenderness, effusions; no spinal/CVA tenderness  Skin: No obvious rashes,  lesions, or jaundice  Neuro: AOx4, attentive, cooperative, CN II-XII intact, strenght 4/5 on RUE and RLE, 0/5 strength on LUE and LLE, sensation intact.   Psych: Normal mood/affect, no SI/HI    Pertinent Labs and Imaging and Meds       WBC 5.7 (09/03) HGB 11.4 (09/03) PLT 285 (09/03)    HCT 36.2 (09/03)      %Neutrophils 56 (09/03) %Bands   %Lymphs 34 (09/03) %Monos 8 (09/03) %Eos 1 (09/03) %Basos 0 (09/03)  Na 140 (09/03) CL 100 (09/03) BUN 15 (09/03) GLU   87 (09/03)   K 3.3* (09/03) CO2 29 (09/03) Cr 0.56 (09/03)        Assessment / Plan   Maitlyn Penza is a 67 year old female with a hx of CVA with left hemiplegia, hypothyroidism 2/2 thyroidectomy c/b stoma, DM, GERD, presenting with 1 week of worsening L sided facial swelling and pain, s/p I&D of L peridontal abscess on 9/1.     #C/f osteomyelitis 2/2 peridontal abscess vs dental caries. S/p I&D pm 9/1. Gram strain culture growing gram + cocci and gram - bacilli with PMNs.  Outside CT from [place] showed peridontal abscess and cellulitis, with erosion of bone in setting of dental caries, c/f osteomyelitis. ESR and CRP elevated.   - Consulted ID for c/f osteo, appreciate recs  - cont ceftriaxone 2gIV q24 and metronidazole 500 IV q8  - peridex swish and spit bid for 5 days (9/1-9/6) per ENT  - f/u culture results  - ENT signing off    Chronic Issues   #Chronic pain, for residual bone pain after stroke  - cont. home norco 5 q4 PRN for moderate pain (and rescue dose)   - norco10 q4 PRN for severe pain  - cont home gabapentin 300 tid    #Hypothyroidism  - cont home levothyroxine 125    #Hx of CVA  - cont home ASA 81    #HTN  - cont home amlodipine 5 daily    GERD  - lansoprazole 15      #Anxiety  - cont home sertraline 25      Discharge planning: clinical improvement  Foley: No foley catheter  VTE prophylaxis: Lovenoox  Diet: Diet Mechanical Soft Texture; Carbohydrate Limited  Last BM:  Miralax, sena  IV fluids: No IV fluids  Access: PIVs  Code status: DNAR  - Full Care    Patient care was discussed in detail with Juleen Starr, MD.    Burns Spain, MD  PGY-1, Internal Medicine

## 2018-06-15 NOTE — Plan of Care (Signed)
Problem: Promotion of Health and Safety  Goal: Promotion of Health and Safety  Description  The patient remains safe, receives appropriate treatment and achieves optimal outcomes (physically, psychosocially, and spiritually) within the limitations of the disease process by discharge.    Information below is the current care plan.  Outcome: Progressing  Flowsheets  Taken 06/15/2018 0815 by Oretha Ellis., RN  Patient /Family stated Goal: " I want to feel better"  Taken 06/15/2018 1125 by Oretha Ellis., RN  Individualized Interventions/Recommendations #1: Cluster care to promote rest.  Individualized Interventions/Recommendations #2 (if applicable): Provide bedbath with CHG  Individualized Interventions/Recommendations #5 (if applicable): Reposition in bed with 2-4 person assist, apply skin barrier to maintain skin integrity  Outcome Evaluation (rationale for progressing/not progressing) every shift: V/S stable. Has good intake. Patient compliant with her care, medications and staying on diet. Requires 2-4 person to minimize pain upon turning. DC pending clinical improvement  Taken 06/15/2018 0445 by Alda Berthold, RN  Individualized Interventions/Recommendations #3 (if applicable): Keep personal belongings and trash can within reach.  Taken 06/14/2018 1348 by Oretha Ellis., RN  Individualized Interventions/Recommendations #4 (if applicable): Assess pain and evaluate PRN administered
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
## 2018-06-15 NOTE — Interdisciplinary (Signed)
Physical Therapy Evaluation    Admitting Physician:  Juleen Starr, MD  Admission Date 06/13/2018    Inpatient Diagnosis:   Problem List       Codes    Impaired functional mobility and endurance     ICD-10-CM: Z74.09  ICD-9-CM: V49.89          IP Start of Service   Treatment start time: 0826  Start of Care: 06/15/18  Onset Date: 06/13/18  Reason for referral: Activity tolerance limitation;Range of motion/strength limitations;Decline in functional ability/mobility    Preferred [place]         Past Medical History:   Diagnosis Date   . Cholangitis    . CVA (cerebral vascular accident) (CMS-HCC)    . Hypertension    . Left-sided muscle weakness    . Obesity    . Stroke (CMS-HCC)    . Uterine cancer (CMS-HCC)       Past Surgical History:   Procedure Laterality Date   . HYSTERECTOMY     . THYROIDECTOMY         PT Acute     Row Name 06/15/18 1300          Type of Visit    Type of Physical Therapy note  Physical Therapy Evaluation     Row Name 06/15/18 1300          Treatment Precautions/Restrictions    Precautions/Restrictions  Fall;Multiple lines     Other Precautions/Restrictions Information  pt with purewick catheter and IV     Row Name 06/15/18 1300          Medical History    History of presenting condition  Pt is a 67 year old female with PMH of CVA w/ left hemiplegia, hypothyroidism 2/2 thyroidectomy, uterine cancer s/p TAH/BSO, OSA s/p tracheostomy, morbid obesity, T2DM, HTN, GERD, anxiety who presented to the ED two days ago due to 1 week h/o progressive, 9/10, left sided facial swelling and tenderness.     Fall history  No falls reported in the last 6 months     Row Name 06/15/18 1300          Functional History    Prior Level of Function  Severe deficits     Equipment required for mobility in the home  Wheelchair hospital bed, hoyer lift, overhead trapeze     Other Functional History Information  pt reports being mostly bedbound for the last year following her caregiver leaving. prior to that ,caregiver  would assist pt to w/c via hoyer lift. pt reports performing pressure relief in bed without assistance as well LUE ROM therex with RUE assistance daily. pt reports having a friend who assists her to sit at the EOB "from time to time" and to perform LLE ROM therex.      [place] Name 06/15/18 1300          Social History    Living Situation  Lives alone;Has caregiver/attendant/assistance available;Assistance required but not available     Home Environment  -- mobile home, ramp entrance     Home accessibility   Accessible with wheelchair or walker     Other Social History Information  pt reports having carevier 2x/day for bed bath/cleaning. reports having a friend assist sometimes with mobility to EOB and ROM. also reports having an additional friend who stops by to assist with meals.      [place] Name 06/15/18 1300          Subjective  Subjective Information  pt supine in bed, pleasant and agreeable to PT. voiced performing therex with LUE as able to maintain ROM.      Patient status  Patient agreeable to treatment;Nursing in agreement for treatment     Row Name 06/15/18 1300          Pain Assessment    Pain Asssessment Tool  Numeric Pain Rating Scale     Row Name 06/15/18 1300          Numeric Pain Rating Scale    Pain Intensity - rating at present  5     Pain Intensity- rating after treatment  5     Location  low back, increased during rolling mobility but resolved with rest/repositioning.      [place] Name 06/15/18 1300          Objective    Overall Cognitive Status  Intact - no cognitive limitations or impairments noted     Communication  No communication limitations or impairments noted. Current status of hearing, speech and vision allow functional communication.     Coordination/Motor control  -- intact R UE/LE     Balance  No balance limitations or impairments noted. Patient is able to maintain balance during mobility skills and daily activities.     Extremity Assessment  Range of motion, strength,  muscle tone and/or  sensation limitations present     LUE findings  0/5 strength throughout, ROM WFL with pt maintaining L wrist/fingers in flexion but able to acheive full ROM with PROM.     RUE findings  3/5 to 3+/5 throughout, ROM WFL     LLE findings  0/5 strength throughout, flaccid; PROM WFL throughout all major joints with exception of L ankle - noted with decreased L ankle DF as pt reported L ankle pain with ROM assessment, resolved with rest/repositoning.     RLE findings  at least 3/5 throughout, ROM WFL at all major joints     Functional Mobility  Functional mobility deficits present     Bed Mobility  Minimum assistance (25% assistance) to roll to the L but unable to roll fully into sidelying due to back pain reports.     Bed Mobility Comments  required bedrail use, dependent assist needed to roll to the R. pt declined sitting EOB due to soiled linens and back pain reports following rolling.     Transfers to/from Stand  -- NT     Other Objective Findings  performed PROM LLE and AAROM therex of RLE of APs, HS, QS and hip abd/add - x5 reps each therex with VCs for technique. Gentle stretching performed of LLE at end ranges with minor pain reports at L ankle stretching into DF that was relieved with rest/repositioning.    MAX education given to pt regarding continued therex performance on RLE to maintain strength/ROM - pt verbalized understanding. Also max education given to pt regarding pressure relief techniques while in supine as well as pillows for positioning to decrease risk of pressure sore development and maintain neutral positioning - pt verbalized understanding.                Eval cont.     [place] Name 06/15/18 1300          [place] AM-PAC: Basic Mobility    Assistance Needed to Turn from Back to Side While in a Flat Bed Without Using Bedrails  2 - A lot (mod/max assist)     Difficulty with Supine to Sit  Transfer  2 - A lot (mod/max assist)     How Much Help Needed to Move to/from Bed to Chair  1 - Total (dependent)      Difficulty with Sit to Stand Transfer from Chair with Arms  1 - Total (dependent)     How Much Help Needed to Walk in Room  1 - Total (dependent)     How Much Help Needed to Climb 3-5 Steps with a Rail  1 - Total (dependent)     AMPAC Total Score  8     Assessment: AM-PAC Basic Mobility Impairment Rating  Score 7-8 - 80-99% impaired     Row Name 06/15/18 1300          Patient/Family Education    Learner(s)  Patient     Learner response to rehab patient education interventions  Verbalizes understanding;Needs reinforcement     Row Name 06/15/18 1300          Assessment    Assessment  Pt is a 67 year old female with PMH of CVA w/ left hemiplegia, hypothyroidism 2/2 thyroidectomy, uterine cancer s/p TAH/BSO, OSA s/p tracheostomy, morbid obesity, T2DM, HTN, GERD, anxiety who presented to the ED two days ago due to 1 week h/o progressive, 9/10, left sided facial swelling and tenderness. Pt currently able to perform rolling to the L with minA + bed features, dependent assistance needed to roll to the R; unable to perform EOB mobility at time of eval due to back pain reports following rolling that was relieved with rest/repositioning in supine. All functional mobility currently limited due to generalized deconditioning, L hemiplegia with L UE/LE weakness, decreased activity tolerance, and pain reports that resolve with rest/repositioning in supine. Pt would benefit from continued skilled IP PT on a trial basis to address current functional mobility deficits, increase independence with all functional mobility tasks and possible caregiver training as able. Once medically cleared for d/c and IP PT goals met, pt may benefit from continued PT in a setting with 24/7 assistance/S available - pending progress/support within the home.     Recommend PROM of LLE daily by nursing to decrease risk of contract development. May benefit from [facility] to L foot ankle for positioning to maintain DF ROM. Also may benefit from Occupational  therapy evaluation.       Rehab Potential  [place] Name 06/15/18 1300          Patient stated Goal    Patient stated goal  none stated     Row Name 06/15/18 1300          Goal 1 (Short Term)    Impairment  Education need     Education need  Family/caregiver able to return demonstrate Home Exercise Program for patient independently to enable patient to achieve stated functional goals and patient     Number of visits  1-3     Goal Status  New     Row Name 06/15/18 1300          Goal 2 (Short Term)    Impairment  Functional mobility limitation     Custom goal  pt will be able to perform bed mobility of rolling to the L mod indep + bed features to allow for appropriate pressure relief techniques     Number of visits  3-5     Goal Status  [place] Name 06/15/18 1300  Goal 3 (Short Term)    Impairment  Functional mobility limitation     Custom goal  pt will be able to perform supine <> sit EOB with maxA to allow for participation in edge of bed activities.     [place] Name 06/15/18 1300          Planned Therapy Interventions and Rationale    Neuromuscular Re-Education  to improve safety during dynamic activities;to normalize muscle tone and coordination;to improve kinesthetic awareness and postural control     Orthotic/Prosthetic Assessment and Training  to determine patient's orthotic/splinting needs and response to device     Therapeutic Activities  to improve functional mobility and ability to navigate in the home and/or community;to improve transfers between surfaces;to restore functional performace using graded activities     Theraputic Exercise  to increase range of motion to allow greater independence with functional mobility skills;to increase strength to allow greater independence with functional mobility skills;to improve activity tolerance to allow greater independence with functional mobility skills     Row Name 06/15/18 1300          Treatment Plan Disussion    Treatment Plan Discussion and  Agreement  Patient/family/caregiver stated understanding and agreement with the therapy plan;No family/caregiver available     [place] Name 06/15/18 1300          Treatment Plan    Continue therapy to address  Range of Motion/Strength limitations;Decline in functional ability/mobility;Activity tolerance limitation caregiver training if able     Frequency of treatment  2 times per week     Duration of treatment (number of visits)  5 visits     Status of treatment  Patient evaluated and will benefit from ongoing skilled therapy     Interdisciplinary Recommendations  Occupational Therapy consult     [place] Name 06/15/18 1300          Patient Safety Considerations    Patient safety considerations  Patient returned to bed at end of treatment;Call light left in reach and fall precautions in place;Patient left  in appropriate pressure relieving position;Nursing notified of safety considerations at end of treatment;Patient may be at risk for falls     Patient assistive device requirements for safe ambulation  Wheelchair     Row Name 06/15/18 1300          Post Acute Discharge Recommendations    Discharge Rehabilitation Reccomendations ([place])  If medically appropriate and available, patient demonstrates tolerance to participate in skilled therapy at the following anticipated level     Therapy level  Skilled nursing ALF; TBD pending progress and support within the home     Equipment recommendations  To be determined as patient progresses in therapy;No equipment needed - patient has own equipment     [place] Name 06/15/18 1300          Therapy Plan Communication    Therapy Plan Communication  Discussed therapy plan with Nursing and/or Physician;Discussed therapy plan and patient's mobility status with Case Manager     [place] Name 06/15/18 1300          Physical Therapy Patient Discharge Instructions    Your Physical Therapist suggests the following  Continue to complete your home exercise program daily as instructed;Continue to follow  your prescribed mobility precautions when moving in and out of bed and walking  as instructed     [place] Name 06/15/18 1600          Type of Eval  Low Complexity [phone])  Completed     Row Name 06/15/18 1600          Therapeutic Procedures    Therapeutic Activities [phone])   Assistance/facilitation of bed mobility;Facillitation of safety awareness/responses during functional tasks;Functional activities;Graded physical assist for functional activities;Patient education;Progressive mobilization to improve functional independence        Total TIMED Treatment (min)   15     Therapeutic exercise  ([id number])   Assisted exercises with tactile cues/facilitation to increase muscle recruitment;Range of motion exercises;Home Exercise Program (HEP) demonstration and performance        Total TIMED Treatment (min)   10     Row Name 06/15/18 1300          Treatment Time     Total TIMED Treatment  (min)  -- 25     Total Treatment Time (min)  40     Treatment start time  0826           The physical therapist of record is endorsed by evaluating physical therapist.

## 2018-06-15 NOTE — Progress Notes (Signed)
Otolaryngology-Head & Neck Surgery Progress Note  Patient: Emily Ball, MRN [id number]  Room: [facility] day:   2 days - Admitted on: 06/13/2018    ID:  Arnesha Schiraldi is a 67 year old female with PMH CVA with L-sided hemiplegia, thyroidectomy, OSA requiring tracheostomy now with persistent tracheocutaneous fistula, and diabetes mellitus. Now with left premaxillary abscess. S/p bedside I&D 06/13/18.    Overnight/Subjective:  Afebrile. VSS  Significant improvement in pain and swelling of left midface  Tolerating soft diet    Diet:  Diet Mechanical Soft Texture; Carbohydrate Limited    Labs:  Lab Results   Component Value Date    WBC 8.7 06/14/2018    HGB 12.0 06/14/2018    HCT 37.7 06/14/2018    PLT 273 06/14/2018     No results found for: INR, PTT  Lab Results   Component Value Date    NA 139 06/14/2018    K 3.2 (L) 06/14/2018    CL 99 06/14/2018    BICARB 28 06/14/2018    BUN 10 06/14/2018    CREAT 0.49 (L) 06/14/2018    GLU 77 06/14/2018    [place] 9.2 06/14/2018    MG 1.8 06/14/2018    PHOS 3.5 06/14/2018     Micro:   Culture (06/13/18):  Gram stain (06/13/18): mod gram + cocci, heavy PMNs    Current Facility-Administered Medications   Medication Dose Frequency   . cefTRIAXone (ROCEPHIN) IV  2,000 mg Q24H NR   . metroNIDAZOLE  500 mg Q8H       Exam  Temperature:  [97.7 F (36.5 C)-98.5 F (36.9 C)] 98.5 F (36.9 C) (09/03 0218)  Blood pressure (BP): (109-129)/(45-79) 116/79 (09/03 0218)  Heart Rate:  [66-82] 79 (09/03 0218)  Respirations:  [16-18] 18 (09/03 0218)  Pain Score: 2 (09/03 0218)  O2 Device: None (Room air) (09/03 0218)  SpO2:  [95 %-99 %] 97 % (09/03 0218)  NAD, alert and appropriately conversant  No focal neurologic deficits  Improvement in left midface facial swelling and asymmetry   Soft, No residual fluctuance or abscess s/p I&D  Poor dentition  Neck soft, tracheocutaneous fistula present without drainage or erythema     Imaging: no new imaging    Assessment & Plan:  67 year old female  with Left medial midfacial abscess, likely odontogenic, status post uncomplicated bedside incision and drainage.    - No evidence of residual abscess on today's exam  - Continue antibiotics per primary  - Peridex mouth rinses BID  - F/u anaerobic cultures/sensitivities  - Recommend meticulous oral care with oral rinses after meals  - Should follow up with dentist after discharge  - Can follow up with ENT prn     Thank you for involving [place] in the care of this patient. We will sign off now. Feel free to page at the number below if you need [place] in the future.     Dispo: Improving s/p I&D with abx. ENT to sign off    Seen and discussed with Chief, Dr. June Leap.  To be discussed with Attending.    Brynda Peon Rosana Hoes, MD  Resident Physician, PGY1  [place]    Personal pager:              [phone]  [facility] Service pager:  [place] Service pager:   [phone]

## 2018-06-15 NOTE — Consults (Addendum)
Infectious Diseases New Consult Note    Date of service: 06/15/18  Hospital day#:   2 days - Admitted on: 06/13/2018  Provider requesting consultation: Juleen Starr, MD    Reason for consultation: Left medial midfacial abscess of likely odontogenic origin w/ concern for osteomyelitis    HPI: The patient is a 67 year old female with PMH of CVA w/ left hemiplegia, hypothyroidism 2/2 thyroidectomy, uterine cancer s/p TAH/BSO, OSA s/p tracheostomy, morbid obesity, T2DM, HTN, GERD, anxiety who presented to the ED two days ago due to 1 week h/o progressive, 9/10, left sided facial swelling and tenderness. Prior to admission, received PO clindamycin as outpatient for symptoms prior to admission, but states she did not finish course w/ 3 tablets remaining. On day of admission, patient was evaluated at OSH where CT revealed left premaxillary hypodensity w/ rim enhancement. Upon admission, patient reported associated subjective fevers, headaches, and lightheadedness. Of note, patient reported an allergy to penicillin w/ swelling of eyes and lips. Patient denied odynophagia, vision changes, SOB, syncope, chills, rashes, nausea, vomiting or other associated symptoms.     On 9/3, patient denied any chills, headaches, lightheadedness, or other associated symptoms. Patient reports 0/10 facial pain on rest and palpation w/ minimal swelling. Patient is afebrile w/ WBC of 5.7.    ANTIMICROBIALS:  Current:  Ceftriaxone IV 2g q24h 09/01-present  Metronidazole  IV 500mg  q8h 09/01-present    Prior:  Cipro 05/2017  Flagyl 05/2017    ROS: Per my usual practice, all systems were reviewed. Notable findings are listed above in the HPI.     PMH and SHX:  Past Medical History:   Diagnosis Date   . Cholangitis    . CVA (cerebral vascular accident) (CMS-HCC)    . Hypertension    . Left-sided muscle weakness    . Obesity    . Stroke (CMS-HCC)    . Uterine cancer (CMS-HCC)      Past Surgical History:   Procedure Laterality Date   . HYSTERECTOMY        . THYROIDECTOMY       MEDS (all relevant medications reviewed in EPIC and notable for):    Current Facility-Administered Medications:   .  amLODIPINE (NORVASC) tablet 5 mg, 5 mg, Oral, Daily, Rodden, Diane, MD, 5 mg at 06/15/18 0804  .  aspirin chewable tablet 81 mg, 81 mg, Oral, Daily, Rodden, Diane, MD, 81 mg at 06/15/18 0804  .  cefTRIAXone (ROCEPHIN) 2,000 mg in sodium chloride 0.9 % 50 mL IVPB, 2,000 mg, IntraVENOUS, Q24H NR, Rodden, Diane, MD, Last Rate: 100 mL/hr at 06/14/18 2056, 2,000 mg at 06/14/18 2056  .  chlorhexidine (PERIDEX) 0.12 % solution 15 mL, 15 mL, Mouth/Throat, BID, Mort Sawyers, MD, 15 mL at 06/15/18 0804  .  diphenhydrAMINE (BENADRYL) tablet 25 mg, 25 mg, Oral, PRN, Rodden, Diane, MD, 25 mg at 06/15/18 0323  .  enoxaparin (LOVENOX) injection 60 mg, 0.5 mg/kg, Subcutaneous, Daily, Rockney Ghee, Jun Yang, MD  .  gabapentin (NEURONTIN) capsule 300 mg, 300 mg, Oral, TID, Rodden, Diane, MD, 300 mg at 06/15/18 1244  .  HYDROcodone-acetaminophen (NORCO) 10-325 MG tablet 1 tablet, 1 tablet, Oral, Q4H PRN, Rodden, Diane, MD  .  HYDROcodone-acetaminophen (NORCO) 5-325 MG tablet 1 tablet, 1 tablet, Oral, Q4H PRN, Rodden, Diane, MD, 1 tablet at 06/14/18 1836  .  lansoprazole (PREVACID) DR capsule 30 mg, 30 mg, Oral, QAM AC, Rodden, Diane, MD, 30 mg at 06/14/18 0555  .  levothyroxine (SYNTHROID) tablet  125 mcg, 125 mcg, Oral, QAM AC, Rodden, Diane, MD, 125 mcg at 06/15/18 0540  .  maalox-lidocaine viscous-diphenhydrAMINE 1:1:1 ratio (FIRST-MOUTHWASH BLM) oral solution 10 mL, 10 mL, Mouth/Throat, 4x Daily PRN, Mort Sawyers, MD  .  metroNIDAZOLE (FLAGYL) IVPB 500 mg, 500 mg, IntraVENOUS, Q8H, Rodden, Diane, MD, Last Rate: 100 mL/hr at 06/15/18 1315, 500 mg at 06/15/18 1315  .  polyethylene glycol (MIRALAX) packet 17 g, 17 g, Oral, Daily, Rodden, Diane, MD, 17 g at 06/15/18 0804  .  senna (SENOKOT) tablet 17.2 mg, 2 tablet, Oral, HS, Rodden, Diane, MD, 17.2 mg at 06/14/18 2056  .  sertraline (ZOLOFT)  tablet 25 mg, 25 mg, Oral, Daily, Rodden, Diane, MD, 25 mg at 06/15/18 0804  .  sodium chloride (PF) 0.9 % flush 3 mL, 3 mL, IntraVENOUS, Q8H, Mort Sawyers, MD, 3 mL at 06/15/18 1315  .  sodium chloride (PF) 0.9 % flush 3 mL, 3 mL, IntraVENOUS, PRN, Mort Sawyers, MD  .  sodium chloride 0.9 % TKO infusion, , IntraVENOUS, Continuous PRN, Rockney Ghee Mabeline Caras, MD    ALLERGIES  Allergies   Allergen Reactions   . Pcn [Penicillins] Anaphylaxis   . Strawberry Rash     Fresh strawberries per patient.    . Sulfa Drugs Rash   . Aspirin-Dipyridamole Unspecified     pruritus   . Nitrous Oxide Other     Gets paranoid from, given at dentist before   . Trazodone Other     Nightmares   . Zolpidem Unspecified     Doesn't help sleep and gets anxious       SOCIAL HISTORY:  Tobacco: None  Alcohol: None  Recreational drugs: None  Living situation: Bedbound, no family in town.     FAMILY HISTORY:  No family history of immunodeficiency disorders.  No family history on file.    PHYSICAL EXAM   Temperature:  [97.7 F (36.5 C)-98.5 F (36.9 C)] 98.3 F (36.8 C) (09/03 0747)  Blood pressure (BP): (109-129)/(55-79) 117/57 (09/03 0747)  Heart Rate:  [73-82] 73 (09/03 0747)  Respirations:  [16-18] 18 (09/03 0747)  Pain Score: 0 (09/03 0747)  O2 Device: None (Room air) (09/03 0747)  SpO2:  [97 %-99 %] 97 % (09/03 0747)     GEN: Pleasant, well appearing.   EYES: PERLLA, EOM intact.  HENT: Minimal swelling of left maxillary area, no tenderness to palpation, no fluctuance, no discharge, no erythema.  NODES: No lymphadenopathy.  CV: RRR, S1/S2 normal, no m/r/g.  PULM: CTAB, no r/r/w, no increased WOB.  ABD: Soft, NTND, normal bowel sounds.  EXT: No edema, cyanosis, clubbing.  SKIN: No rashes, lesions, jaundice, cyanosis.  NEURO: AOx4, attentive, cooperative, CN II-XII intact, strength 4/5 on RUE and RLE, strength 0/5 on LUE. and LLE, sensation intact and sensitive on LUE and LLE.  PSYCH: Normal mood and affect, no SI or HI.    LABS (All pertinent  labs reviewed in EPIC and notable for):  WBC: 5.7  CRP: 17.3  Sed: 90    MICRO:  Microbiology Results (last 7 days)     Procedure Component Value - Date/Time    Body Site Culture w/Gram Stain, Aerobic and Anaerobic Sterile Container Face/Neck Region [[id number]]  (Abnormal) Collected:  06/13/18 2123    Lab Status:  Preliminary result Specimen:  Swab from Face/Neck Region Updated:  06/15/18 1313     Gram Stain Result --     Heavy polymorphonucleated white blood cells  Moderate red  blood cells  Moderate Gram positive cocci   Few Gram negative bacilli       Body Site Culture Result --     Light mixed skin microbiota  Further report to follow       Body Site Culture Eikenella corrodens  Heavy  Identification performed by [employer]( Maldi-ToF). This test  was developed and its performance characteristics determined by [facility]. It has not been cleared or  approved by the U.S. Food and Drug Administration.  The FDA has  determined that such clearance or approval is not necessary.        Streptococcus anginosus  Moderate  Identification performed by [employer] [facility]( Maldi-ToF). This test  was developed and its performance characteristics determined by [facility]. It has not been cleared or  approved by the U.S. Food and Drug Administration.  The FDA has  determined that such clearance or approval is not necessary.       Anaerobic Culture Result Culture in progress.    Narrative:       Left premaxillary odontogenic abscess    MRSA Surveillance Culture Angela Burke Nares [[id number]] Collected:  06/13/18 1840    Lab Status:  Final result Specimen:  Nares Updated:  06/15/18 1130     MRSA Surveillance Result No Methicillin Resistant Staphylococcus aureus isolated.            IMAGING (personally reviewed by me and notable for):  CT from OSH revealed left premaxillary hypodensity w/ rim enhancement.    Assessment and Plan:    Patient is a 66 year old  female w/ PMH of CVA w/ left hemiplegia, hypothyroidism 2/2 thyroidectomy, uterine cancer s/p TAH/BSO, OSA s/p tracheostomy, morbid obesity, T2DM, HTN, GERD, anxiety who presented to the ED on 9/1 with a 1 week history of progressive left, medial, midfacial tenderness and swelling. She had been evaluated as outpatient prior to admission and prescribed clindamycin, which she did not finish the antibiotic course for w/ 3 tablets remaining. On day of admission, she was evaluated by OSH in which CT revealed left premaxillary hypodensity w/ rim enhancement.     Upon admission, patient had 100.8 temp and 8.7 WBC. Patient was started on ceftriaxone 2g q24h and metronidazole 500mg  q8h. ENT was consulted and performed I&D, draining purulent material.     On 9/3, patient was afebrile w/ WBC 5.7. Patient reported 0/10 pain and minimal swelling without any other complaints. Cultures pending.     We reviewed the outside imaging.  While there certainly is evidence of maxillary erosion, we do not feel this should be treated for prolonged course as you would a long-bone OM.  Infections in this area tend to do well.  That being said, she has extensively poor dentition, so we would recommend extending treatment for odontogenic abscess for a bit longer.  She will need f/u with OMFS in the outpatient.     Etiology of her transaminitis is unclear.  Elevated from baseline but present on admission so doubt effect of antibiotics.    Left premaxillary odontogenic abscess  1. Recommend continuation of current antibiotic regimen of ceftriaxone 2g q24h and metronidazole 500mg  q8h for 14 total days of therapy with the hope of switching to orals based on susceptibilities.   2. Follow up on culture and susceptibility results.    3. Continue Peridex rinses BID until 9/6.  4. Follow up with OMFS upon discharge.  5. Please consult Allergy to evaluate  for penicillin allergy    Transaminitis  --From 2018.  Would repeat now to eval for resolution.  If  still elevated, would check hepatitis serologies and RUQ [place]    Recommendations discussed with/paged to the primary team. Patient seen by and discussed with Dr. Lake Bells.     Brayton Layman  MS4      Attending Attestation    I have seen and examined the patient with Brayton Layman.  I have reviewed the relevant laboratory and imaging data.  I have reviewed the patient's medications.  I agree with the history, physical, assessment and plan as discussed with Brayton Layman.      67 yo woman with an extensive past medical history and poor dentition admitted with odontogenic abscess s/p I+D.  Will likely require at least 2 weeks of antibiotics.  Will follow up cultures to tailor regimen.  Would consult Allergy to eval whether she can be trialed on a penicillin (augmentin).    Currie Paris, MD, MS  Infectious Diseases Attending

## 2018-06-15 NOTE — Plan of Care (Signed)
Problem: Promotion of Health and Safety  Goal: Promotion of Health and Safety  Description  The patient remains safe, receives appropriate treatment and achieves optimal outcomes (physically, psychosocially, and spiritually) within the limitations of the disease process by discharge.    Information below is the current care plan.  Outcome: Progressing  Flowsheets  Taken 06/14/2018 2056  Patient /Family stated Goal: "I want to get some sleep tonight"   Taken 06/15/2018 0445  Guidelines: Inpatient Nursing Guidelines  Individualized Interventions/Recommendations #1: Cluster care to promote rest and turn off lights.  Individualized Interventions/Recommendations #2 (if applicable): Provide patient with earplugs to minimize disruption.  Individualized Interventions/Recommendations #3 (if applicable): Keep personal belongings and trash can within reach.  Outcome Evaluation (rationale for progressing/not progressing) every shift: Progressing. Pt reports pain is improved and only has a slight pressure. Pt resting comfortably.

## 2018-06-16 LAB — BASIC METABOLIC PANEL, BLOOD
Anion Gap: 10 mmol/L (ref 7–15)
BUN: 14 mg/dL (ref 8–23)
Bicarbonate: 29 mmol/L (ref 22–29)
Calcium: 8.9 mg/dL (ref 8.5–10.6)
Chloride: 104 mmol/L (ref 98–107)
Creatinine: 0.48 mg/dL — ABNORMAL LOW (ref 0.51–0.95)
GFR: >60 mL/min
Glucose: 102 mg/dL — ABNORMAL HIGH (ref 70–99)
Potassium: 3.4 mmol/L — ABNORMAL LOW (ref 3.5–5.1)
Sodium: 143 mmol/L (ref 136–145)

## 2018-06-16 LAB — LIVER PANEL, BLOOD
ALT (SGPT): 6 U/L (ref 0–33)
AST (SGOT): 10 U/L (ref 0–32)
Albumin: 3 g/dL — ABNORMAL LOW (ref 3.5–5.2)
Alkaline Phos: 65 U/L (ref 35–140)
Bilirubin, Dir: <0.2 mg/dL (ref <0.2)
Bilirubin, Tot: 0.16 mg/dL (ref <1.2)
Total Protein: 7.5 g/dL (ref 6.0–8.0)

## 2018-06-16 LAB — CBC WITH DIFF, BLOOD
ANC-Automated: 2.5 10*3/uL (ref 1.6–7.0)
Abs Basophils: 0 10*3/uL (ref <0.1)
Abs Eosinophils: 0.1 10*3/uL (ref 0.1–0.5)
Abs Lymphs: 1.5 10*3/uL (ref 0.8–3.1)
Abs Monos: 0.4 10*3/uL (ref 0.2–0.8)
Basophils: 1 %
Eosinophils: 2 %
Hct: 36.5 % (ref 34.0–45.0)
Hgb: 11.7 gm/dL (ref 11.2–15.7)
Lymphocytes: 33 %
MCH: 31.5 pg (ref 26.0–32.0)
MCHC: 32.1 g/dL (ref 32.0–36.0)
MCV: 98.1 um3 — ABNORMAL HIGH (ref 79.0–95.0)
MPV: 10.6 fL (ref 9.4–12.4)
Monocytes: 8 %
Plt Count: 303 10*3/uL (ref 140–370)
RBC: 3.72 10*6/uL — ABNORMAL LOW (ref 3.90–5.20)
RDW: 15.3 % — ABNORMAL HIGH (ref 12.0–14.0)
Segs: 56 %
WBC: 4.5 10*3/uL (ref 4.0–10.0)

## 2018-06-16 LAB — MAGNESIUM, BLOOD: Magnesium: 1.9 mg/dL (ref 1.6–2.4)

## 2018-06-16 LAB — PHOSPHORUS, BLOOD: Phosphorous: 2.9 mg/dL (ref 2.7–4.5)

## 2018-06-16 MED ORDER — PENICILLIN G SODIUM DILUTION 10,000 UNITS/ML
0.05 mL | Freq: Once | Status: DC
Start: 2018-06-16 — End: 2018-06-16

## 2018-06-16 MED ORDER — MOXIFLOXACIN HCL 400 MG OR TABS
400.00 mg | ORAL_TABLET | Freq: Every day | ORAL | Status: DC
Start: 2018-06-16 — End: 2018-06-17
  Administered 2018-06-16: 400 mg via ORAL
  Filled 2018-06-16: qty 1

## 2018-06-16 MED ORDER — AMOXICILLIN 250 MG OR CAPS
250.00 mg | ORAL_CAPSULE | Freq: Once | ORAL | Status: DC
Start: 2018-06-16 — End: 2018-06-16

## 2018-06-16 MED ORDER — THERA M PLUS OR TABS
1.00 | ORAL_TABLET | Freq: Every day | ORAL | Status: DC
Start: 2018-06-16 — End: 2018-06-18
  Administered 2018-06-16 – 2018-06-18 (×3): 1 via ORAL
  Filled 2018-06-16 (×3): qty 1

## 2018-06-16 MED ORDER — BENZYLPENICILLOLOYL POLYLYSINE 0.25 ML ID SOLN
0.20 mL | Freq: Once | INTRADERMAL | Status: DC
Start: 2018-06-16 — End: 2018-06-16

## 2018-06-16 MED ORDER — VITAMIN C 500 MG OR TABS
500.00 mg | ORAL_TABLET | Freq: Every day | ORAL | Status: DC
Start: 2018-06-16 — End: 2018-06-18
  Administered 2018-06-16 – 2018-06-18 (×3): 500 mg via ORAL
  Filled 2018-06-16 (×3): qty 1

## 2018-06-16 MED ORDER — MOXIFLOXACIN HCL 400 MG OR TABS
400.00 mg | ORAL_TABLET | Freq: Every day | ORAL | Status: DC
Start: 2018-06-16 — End: 2018-06-16

## 2018-06-16 MED ORDER — BENZYLPENICILLOLOYL POLYLYSINE 0.25 ML ID SOLN
0.05 mL | Freq: Once | INTRADERMAL | Status: DC
Start: 2018-06-16 — End: 2018-06-16

## 2018-06-16 MED ORDER — METRONIDAZOLE 500 MG OR TABS
500.00 mg | ORAL_TABLET | Freq: Three times a day (TID) | ORAL | Status: DC
Start: 2018-06-16 — End: 2018-06-18
  Administered 2018-06-16 – 2018-06-17 (×3): 500 mg via ORAL
  Filled 2018-06-16 (×4): qty 1

## 2018-06-16 MED ORDER — PENICILLIN G SODIUM DILUTION 10,000 UNITS/ML
0.20 mL | Freq: Once | Status: DC
Start: 2018-06-16 — End: 2018-06-16

## 2018-06-16 MED ORDER — EPINEPHRINE 0.3 MG/0.3ML IJ SOAJ
0.30 mg | Freq: Once | INTRAMUSCULAR | Status: DC | PRN
Start: 2018-06-16 — End: 2018-06-16

## 2018-06-16 NOTE — Plan of Care (Signed)
Problem: Promotion of Health and Safety  Goal: Promotion of Health and Safety  Description  The patient remains safe, receives appropriate treatment and achieves optimal outcomes (physically, psychosocially, and spiritually) within the limitations of the disease process by discharge.    Information below is the current care plan.  Outcome: Progressing  Flowsheets  Taken 06/15/2018 2013  Patient /Family stated Goal: "I can't wait to go home. My back hurts on this bed. They tried putting pillows under me and it hurts"  Taken 06/16/2018 0320  Guidelines: Inpatient Nursing Guidelines  Individualized Interventions/Recommendations #1: Encourage patient to allow reposition to help with back pain. Repositiong and PROM when providing pericare.  Individualized Interventions/Recommendations #2 (if applicable): Cluster care to promote rest. Turn off lights.  Individualized Interventions/Recommendations #3 (if applicable): Assist with oral care as needed. Encourage patient to brush teeth before bed and meals.  Outcome Evaluation (rationale for progressing/not progressing) every shift: Progressing. Pt compliant with care. Pt resting comfortably.

## 2018-06-16 NOTE — Interdisciplinary (Cosign Needed)
06/16/18 0545   Impaired Skin Integrity -  Unknown Sacrum Lower   Date First Assessed/Time First Assessed: 06/16/18 0545   Present on admission (to hospital)?: No  Present on admission to unit?: No  Wound Type: Unknown  Location: Sacrum  Wound Location Orientation: Lower  Wound Consult Request: Consult Needed   Assessment Red   Peri-wound Assessment Hyperpigmented   Drainage Amount None   Treatment Moisture barrier / ointment   Dressing Open to air

## 2018-06-16 NOTE — Interdisciplinary (Signed)
Patient's PIV leaking and was discontinued. Patient refusing new PIV at this time. Patient wanted to wait to be evaluated by allergist first.

## 2018-06-16 NOTE — Progress Notes (Addendum)
Infectious Diseases Progress Note    Date of service: 06/16/18    Events:   Patient has developed sacral skin wound.     Subjective:   Patient reports resolution of pain and swelling in face. Complains of slight tenderness at base of lower neck exacerbated with movement. Tolerating PO intake. Patient expresses desire to go home and states she is unhappy here. Denies any nausea, vomiting, abdominal pain, SOB, chest pain, or other associated symptoms.     ANTIMICROBIALS:  Current:  Ceftriaxone IV 2g q24h 09/01-present  Metronidazole  IV 500mg  q8h 09/01-present    Prior:  Ciprofloxacin 05/2017  Flagyl 05/2017    ROS: Per my usual practice, all systems were reviewed. Notable findings are listed above in the HPI.     PHYSICAL EXAM  Temperature:  [97.8 F (36.6 C)-98.8 F (37.1 C)] 97.9 F (36.6 C) (09/04 0715)  Blood pressure (BP): (108-121)/(53-57) 116/57 (09/04 0715)  Heart Rate:  [66-73] 66 (09/04 0715)  Respirations:  [18-19] 18 (09/04 0715)  Pain Score: 0 (09/04 0715)  O2 Device: None (Room air) (09/04 0715)  SpO2:  [97 %-99 %] 99 % (09/04 0715)    GEN: Well appearing, slight distress due to dissatisfaction with care.  EYES: PERLLA, EOM intact.  HENT: No swelling of left maxillary area, no tenderness to palpation, no fluctuance, no discharge, no erythema. Patient complains of strain on base of lower left neck, no pain elicited with palpation.  NODES: No lymphadenopathy.  CV: RRR, S1/S2 normal, no m/r/g.  PULM: CTAB, no r/r/w, no increased WOB.  ABD: Soft, NTND, normal bowel sounds. No pain on palpation of RUQ, negative Murphy sign.   EXT: No edema, cyanosis, clubbing.  SKIN: No rashes, lesions, jaundice, cyanosis.  NEURO: AOx4, attentive, cooperative, CN II-XII intact, strength 4/5 on RUE and RLE, strength 0/5 on LUE. and LLE, sensation intact and sensitive on LUE and LLE.  PSYCH: Sad mood and affect, no SI or HI.    All pertinent labs reviewed in EPIC and notable for:  Patient refused lab draw this morning due  to phobia of needles and being a hard stick.     MICRO:  Microbiology Results (last 30 days)     Procedure Component Value - Date/Time    Body Site Culture w/Gram Stain, Aerobic and Anaerobic Sterile Container Face/Neck Region [[id number]]  (Abnormal) Collected:  06/13/18 2123    Lab Status:  Preliminary result Specimen:  Swab from Face/Neck Region Updated:  06/16/18 0658     Gram Stain Result --     Heavy polymorphonucleated white blood cells  Moderate red blood cells  Moderate Gram positive cocci   Few Gram negative bacilli       Body Site Culture Result --     Light mixed skin microbiota  Further report to follow       Body Site Culture Eikenella corrodens  Heavy  Identification performed by [employer]( Maldi-ToF). This test  was developed and its performance characteristics determined by [facility]. It has not been cleared or  approved by the U.S. Food and Drug Administration.  The FDA has  determined that such clearance or approval is not necessary.        Streptococcus anginosus  Moderate  Identification performed by [employer]( Maldi-ToF). This test  was developed and its performance characteristics determined by [facility]. It has not been cleared or  approved by the U.S. Food and Drug Administration.  The FDA has  determined that such clearance or approval is not necessary.       Anaerobic Culture Result No growth of anaerobes to date    Narrative:       Left premaxillary odontogenic abscess    MRSA Surveillance Culture Angela Burke Nares [[id number]] Collected:  06/13/18 1840    Lab Status:  Final result Specimen:  Nares Updated:  06/15/18 1130     MRSA Surveillance Result No Methicillin Resistant Staphylococcus aureus isolated.            IMAGING (personally reviewed by me and notable for):  9/1: CT from OSH revealed left premaxillary hypodensity w/ rim enhancement.    Assessment and Plan:    Patient is a 67 year old female w/  PMH of CVA w/ left hemiplegia, hypothyroidism 2/2 thyroidectomy, uterine cancer s/p TAH/BSO, OSA s/p tracheostomy, morbid obesity, T2DM, HTN, GERD, anxiety who presented to the ED on 9/1 with a 1 week history of progressive left, medial, midfacial tenderness and swelling. She had been evaluated as outpatient prior to admission and prescribed clindamycin, which she did not finish the antibiotic course for w/ 3 tablets remaining. On day of admission, she was evaluated by OSH in which CT revealed left premaxillary hypodensity w/ rim enhancement. We reviewed this imaging, and while there certainly is evidence of maxillary erosion, we do not feel this should be treated for prolonged course as you would a long-bone OM. Infections in this area tend to do well. That being said, she has extensively poor dentition, so we would recommend extending treatment for odontogenic abscess for a bit longer. She will need f/u with OMFS in the outpatient.     Upon admission, patient had 100.8 temp and 8.7 WBC. Patient was started on ceftriaxone 2g q24h and metronidazole 500mg  q8h. ENT was consulted and performed I&D, draining purulent material. On 9/3, patient was afebrile w/ WBC 5.7. On 9/4, patient reported 0/10 pain and no swelling without any other complaints. Preliminary cultures revealed heavy Eikenella Corrodens and moderate Streptococcus Anginosus. Allergy has been consulted to evaluate whether she can be trialed on a pencillin (augmentin).     Patient has history of transaminitis with unclear origin.     #Left premaxillary odontogenic abscess  --Continue Ceftriaxone; OK to give Flagyl orally  --Patient does NOT have any PO alternatives to Augmentin since her QTc is too long for a fluoroquinolone  --Appreciate Allergy consult; please proceed with PCN skin testing and PO amox challenge tomorrow        Attending Attestation    I have seen and examined the patient with Brayton Layman.  I have reviewed the relevant laboratory and  imaging data.  I have reviewed the patient's medications.  I agree with the history, physical, assessment and plan as discussed with Brayton Layman.      I have edited the note above to reflect my changes.    Currie Paris, MD, MS  Infectious Diseases Attending

## 2018-06-16 NOTE — Interdisciplinary (Signed)
[name] is sending a message to: DIANE RODDEN / [phone]    Pt. Emily Ball, Rm [phone]. Pt requesting PRN Ativan. Med rec shows Ativan 0.5 mg PO PRN BID. Please advise.

## 2018-06-16 NOTE — Interdisciplinary (Addendum)
[name] is sending a message to: DIANE RODDEN / [phone]     Pt. Emily Ball, Rm [phone]. Pt refusing PIV at this time. Pt requesting that IV flagyl and rocephin be changed to oral.  Please advise.    UPDATE: Received call back from MD. MD will order an EKG to check QTc if transition to oral medication is safe.

## 2018-06-16 NOTE — Interdisciplinary (Signed)
[id number] is sending a message to: PAGER EKG / [phone]    Pt. Emily Ball, Rm [phone]. Can we get an EKG?  Thanks!

## 2018-06-16 NOTE — Interdisciplinary (Signed)
*  Nutrition note    39 year oldfemale with a hx of CVA with left hemiplegia, hypothyroidism 2/2 thyroidectomy c/b stoma, DM, GERD, presenting L medial midfacial abscess, s/p I&D on 9/1.  Per MD note today, PT feels well this morning. Improved swelling of lips, able to tolerate po, but endorses some L sided neckpain.  Weight loss trigger reviewed.  Noted hx of 298# per photo ID.    06/13/18 124 kg (273 lb 5.9 oz)   11/20/17 145.2 kg (320 lb)   07/24/17 133.7 kg (294 lb 12.1 oz)   06/02/17 113.4 kg (250 lb)     Noted avg. Wt loss of 5.2% x 7-12 months (not significant).  PT is 188% of IBW with a BMI of 40.     Tray Items Taken for the past 168 hrs:   Number of Items Taken Number of Items on Tray Diet Tolerance   06/14/18 0830 1.5 4 Tolerates   06/14/18 1334 3 5 Tolerates   06/14/18 1837 3 4 Tolerates   06/15/18 0809 4 4 Tolerates     Noted avg. Intake of 78% x last 3 meals; po improving.  Did note new sacral wound; stage unknown at this time.  Would order a MVI w/minerals and Vit C 500mg  daily to help with wound healing.  Will monitor and f/u with PT per nutrition policy.    Irven Baltimore, RD pager [phone]

## 2018-06-16 NOTE — Interdisciplinary (Signed)
Pt refusing to wear patient ID band, states she "has bondage issues" and has not been wearing it.

## 2018-06-16 NOTE — Interdisciplinary (Signed)
Paged [phone]    RE: Emily Ball has new wound in sacrum. Wound pic in chart. Can you order wound consult? Thank you!

## 2018-06-16 NOTE — Consults (Signed)
Wound/Ostomy RN Consult    Admit date: 06/13/2018    Emily Ball is a 67 year old female  Unit: 616/616B    Pcn [penicillins]; Strawberry; Sulfa drugs; Aspirin-dipyridamole; Nitrous oxide; Trazodone; and Zolpidem  Reason for consult:  Wound - gluteal cleft   Present on Admission?   no  Admit diagnosis: Facial abscess [L02.01]  Periodontal abscess [K05.219]    Medical Hx   Past Medical History:   Diagnosis Date   . Cholangitis    . CVA (cerebral vascular accident) (CMS-HCC)    . Hypertension    . Left-sided muscle weakness    . Obesity    . Stroke (CMS-HCC)    . Uterine cancer (CMS-HCC)      Surgery: Paste in Brief Op Note    CBC  Recent Labs     06/14/18  0524 06/15/18  0550 06/16/18  1033   WBC 8.7 5.7 4.5   RBC 3.87* 3.71* 3.72*   HGB 12.0 11.4 11.7   HCT 37.7 36.2 36.5   MCV 97.4* 97.6* 98.1*   MCH 31.0 30.7 31.5   MCHC 31.8* 31.5* 32.1   RDW 15.2* 15.2* 15.3*   MPV 10.9 10.8 10.6   PLT 273 285 303   SEG 66 56 56   LYMPHS 24 34 33   MONOS '10 8 8   '$ EOS 0 1 2   BASOS 0 0 1       Chemistry  Recent Labs     06/14/18  0524 06/15/18  0550 06/16/18  1033   NA 139 140 143   K 3.2* 3.3* 3.4*   CL 99 100 104   BICARB '28 29 29   '$ BUN '10 15 14   '$ CREAT 0.49* 0.56 0.48*   GLU 77 87 102*   Bosworth 9.2 9.0 8.9   MG 1.8 1.9 1.9   PHOS 3.5 3.7 2.9       LFTs  Recent Labs     06/16/18  1033   ALK 65   AST 10   ALT 6   TBILI 0.16   DBILI <0.2   ALB 3.0*       Coags  No results for input(s): PT, PTT, INR in the last 72 hours.    No results for input(s): PREALB in the last 2160 hours.    No results for input(s): A1C in the last 2160 hours.    No results found for: BLOODCULT    Surgical History   Past Surgical History:   Procedure Laterality Date   . HYSTERECTOMY     . THYROIDECTOMY         Current Nutrition: Diet Type: Regular    Diet Status: PO    Pain: none    Braden Score: 13    Complications this Hospitalization:     Bed Surface: Isoflex low air loss with pump  Last Physical Therapy date:    Last Occupational Therapy date:            Joint visit with primary nurse Melissa.  Emily Ball was found alert and oriented.  She is aware that she have a new onset skin breakdown to her gluteal cleft.  She verbalized this is possibly due to rubbing of her skin against the sheet.    Image:     Gluteal cleft - Intertriginous MASD due to incontinency and perspiration build up mix etiology with friction. Linear,partial thickness skin loss with red wound bed.  No drainage.  Periwound is  macerated.  Wound edges are intact. Measurement 2cmx0.5cmx0.1cm    Barriers to healing: HTN, CVA with residual left hemiplegia, history of metastatic uterine cancer s/p TAH/BSO 09/13/2007, XRT 12/2007, HTN, hypothyroidism, glaucoma peripheral neuropathy, morbid obesity, bed bound.    Adjunctive Therapies: zinc oxide barrier cream, adhesive silicone foam    Current Goal of Treatment: wound healing    Wound Care Recommendations: Cleanse gluteal cleft and surrounding skin with peri wash, pat dry.  Apply zinc oixde base paste (z-guard) BID and PRN.  Leave open to air.    Turn and reposition q2 hours and PRN  Keep clean and dry at all times  Keep HOB <30 degrees if not contraindicated.    Patient/Family Education: Discussed assessment and recommendations to patient and her friend at the bedside.  Zinc oxide based paste use as a barrier due to incontinency and perspiration.  Pressure Injury prevention measures was also discussed such as turning and repositioning q2 hours and PRN and offloading heels with pillows.  Both verbalized understanding.    Findings and recommendations communicated to: Emily Ball, her friend and primary nurse Melissa.    Plan: WON will sign off.  Please reconsult as indicated.    Wound Care nurse: Mortimer Fries, RN, BSN, [facility]

## 2018-06-16 NOTE — Interdisciplinary (Signed)
MD at nurses station to inform this writer that she was going to talk to pt about starting a new PIV.  Pt still adamant about not starting new PIV and awaiting to transition to oral medication.

## 2018-06-16 NOTE — Progress Notes (Signed)
[facility] day:   3 days - Admitted on: 06/13/2018    Emily Ball is a 67 year old female with a hx of CVA with left hemiplegia, hypothyroidism 2/2 thyroidectomy c/b stoma, DM, GERD, presenting L medial midfacial abscess, s/p I&D on 9/1    24 Hour - Interval Events   - Culture growing eikenella corrodens and strepanginosus  - new sacral wound     Subjective   - Feels well this morning. Improved swelling of lips, able to tolerate po, but endorses some L sided neckpain.   - No fevers/chills/nightsweats, dysphagia, odynophagia, n/v, vision changes, nasal discharge. No headache or lightheadedness/dizziness.   - refused am labs    ROS:      Physical Exam   Temp  Min: 97.8 F (36.6 C)  Max: 98.8 F (37.1 C)  Pulse  Min: 66  Max: 73  BP  Min: 108/53  Max: 121/54  Resp  Min: 18  Max: 19  SpO2  Min: 97 %  Max: 99 %    BP 116/57 (BP Location: Right arm, BP Patient Position: Supine)   Pulse 66   Temp 97.9 F (36.6 C)   Resp 18   Ht '5\' 9"'$  (1.753 m)   Wt 124 kg (273 lb 5.9 oz)   SpO2 99%   BMI 40.37 kg/m  O2 Device: None (Room air)      09/03 0600 - 09/04 0559  In: 849 [P.O.:690; I.V.:159]  Out: 500 [Urine:500]  Urine x 1 Stool x 0 Emesis x 0       General: NAD, sitting comfortably in bed  HEENT:  swelling of upper L lip, mild swelling of L lips, improved from yesterday, some induration noted on upper L cheek near nasolabial fold, no fluctuance, drainage, or bleeding noted from I&D site, mildly tender to palpation, poor dentition, PERLL, EOMI,  Neck: Supple, trachea midline, no LAD, no JVD, stoma present, c/d/i, no purulent discharge noted  CV: RRR, S1/S2 normal, no r/m/g  Lungs: CTAB, no r/r/w, no increased wob  Abdomen: Soft, NTND, BS+, no HSM  Extremities: wwp, dp/pt 2+, no c/c/e  MSK/Back: No joint erythema, tenderness, effusions; no spinal/CVA tenderness  Skin: No obvious rashes, lesions, or jaundice  Neuro: AOx4, attentive, cooperative, CN II-XII intact, strenght 4/5 on RUE  and RLE, 0/5 strength on LUE and LLE, sensation intact.   Psych: Normal mood/affect, no SI/HI    Pertinent Labs and Imaging and Meds       WBC 5.7 (09/03) HGB 11.4 (09/03) PLT 285 (09/03)    HCT 36.2 (09/03)      %Neutrophils 56 (09/03) %Bands   %Lymphs 34 (09/03) %Monos 8 (09/03) %Eos 1 (09/03) %Basos 0 (09/03)  Na 140 (09/03) CL 100 (09/03) BUN 15 (09/03) GLU   87 (09/03)   K 3.3* (09/03) CO2 29 (09/03) Cr 0.56 (09/03)        Assessment / Plan   Emily Ball is a 67 year old female with a hx of CVA with left hemiplegia, hypothyroidism 2/2 thyroidectomy c/b stoma, DM, GERD, presenting with 1 week of worsening L sided facial swelling and pain, s/p I&D of L peridontal abscess on 9/1.     #C/f osteomyelitis 2/2 peridontal abscess vs dental caries. S/p I&D pm 9/1. Culture growing eikenella corrodens and strepanginosus. Outside CT from [facility] showed peridontal abscess and cellulitis, with erosion of bone in setting of dental caries, c/f osteomyelitis. ESR and CRP  elevated. ID   - per ID: continue antibiotics until 9/14 (total 14 day course)   - cont ceftriaxone 2gIV q24 and metronidazole 500 IV q8, can switch to po abx pending sensitivities  - per Allergy, unable to eval penicillin allergy with skin testing today as received benadryl for mild pruritus on 9/3, can plan for skin testing on 9/4   - peridex swish and spit bid for 5 days (9/1-9/6) per ENT  - f/u with OMFS outpatient upon discharge    #Stage II SDU  - wound consult     #Hx of transaminitis. LFTs elevated in 2018.   - repeat LFTs per ID  - if still elevated, consider RUQ U/S and hep panel (HAV IgM/IgG, HB sAB, HBV sAG, HBV cAB, HCV ab)     # Hx of penicillin allergy. Had childhood hx of lip/eye swelling, los suspicion for current allergy per Allergy  - Allergy consulted, appreciate recs  - f/u with allergy outpatient     Chronic Issues   #Chronic pain, for residual bone pain after stroke  - cont. home norco 5 q4 PRN for moderate pain (and rescue dose)      - norco10 q4 PRN for severe pain  - cont home gabapentin 300 tid    #Hypothyroidism  - cont home levothyroxine 125    #Hx of CVA  - cont home ASA 81    #HTN  - cont home amlodipine 5 daily    GERD  - lansoprazole 15      #Anxiety  - cont home sertraline 25      Discharge planning: clinical improvement  Foley: No foley catheter  VTE prophylaxis: Lovenoox  Diet: Diet Mechanical Soft Texture; Carbohydrate Limited  Last BM:  Miralax, sena  IV fluids: No IV fluids  Access: PIVs  Code status: DNAR - Full Care    Patient care was discussed in detail with Juleen Starr, MD.    Burns Spain, MD  PGY-1, Internal Medicine

## 2018-06-16 NOTE — Plan of Care (Signed)
Problem: Promotion of Health and Safety  Goal: Promotion of Health and Safety  Description  The patient remains safe, receives appropriate treatment and achieves optimal outcomes (physically, psychosocially, and spiritually) within the limitations of the disease process by discharge.    Information below is the current care plan.  Outcome: Progressing  Flowsheets (Taken 06/16/2018 1345)  Guidelines: Inpatient Nursing Guidelines  Individualized Interventions/Recommendations #1: Assist patient in removing lids on meal items so that patient can feed herself independently.  Individualized Interventions/Recommendations #2 (if applicable): Follow up with PRN ativan with MD per patient request.  Outcome Evaluation (rationale for progressing/not progressing) every shift: Patient alert and oriented. Assisted patient in removing lids off breakfast and lunch. Patient able to eat meals independently. Paged MD regarding PRN ativan. Still waiting for call back. Will continue to monitor.

## 2018-06-16 NOTE — Interdisciplinary (Signed)
Phlebotomy at bedside to draw morning labs. Pt still upset at phlebotomist that came earlier and refused lab draw again. MD at bedside and is aware.

## 2018-06-16 NOTE — Consults (Signed)
Brief Allergy/Immunology New Patient Consultation    Reason for consultation: penicillin allergy    HPI: [name] is a 67yo female with hx oc CVA w/ hemiplegia, hypothyroidism, uterine cancer, OSA s/p tracheostomy, obesity who is hospitalized for L medial midfacial abscess s/p I&D by ENT. She had failed outpatient treatment with clindamycin and is now on ceftriaxone and metronidazole. Per ID, would like to treat her for 2 weeks and with hx of penicillin allergy, inquiring whether she can receive Augmentin.    When she was a child, she was told by her mother that she was allergic to penicillin when she was less than 74 years old. She does remember the reaction, swollen lips and eyes, but does not remember other details including what it was prescribed for, other medicines started around the same time, how fast after getting the medicine the reaction occurred, how she was treated for this reaction. She does not believe she had shortness of breath, rash, or hives. She does not believe she has had any other -cillins including amoxicillin, augmentin since childhood.     She does not recall the precise reaction to Bactrim or when it occurred, but thinks that it may have been prescribed it for a dental infection. Review of chart, presented to ED at [facility] with lip swelling and rash, treated with benadryl and discharged from ED. No culprit drug identified. Patient notes that this was due to an "attack of candida."    Having some itching at the surgical site early yesterday morning and received benadryl for this. She has not had itching or rash elsewhere during her stay thus far.     Review of Systems:  General: No fevers, chills, weight loss, night sweats.  Neuro: No headaches, dizziness, visual changes  HEENT: No runny nose, congestion, itchy, watery eyes, conjunctivitis, otalgia  Resp: No cough, SOB, wheezing, chest tightness  CV: No murmur, palpitations, chest pain  GI: No vomiting, diarrhea, abdominal pain  Musc: No  arthralgias, no joint swelling  Skin: No skin lesions or urticaria  Psych: +anxiety  Complete 14 point ROS negative except as outlined above.    Past Medical History  Past Medical History:   Diagnosis Date   . Cholangitis    . CVA (cerebral vascular accident) (CMS-HCC)    . Hypertension    . Left-sided muscle weakness    . Obesity    . Stroke (CMS-HCC)    . Uterine cancer (CMS-HCC)      PSH  Past Surgical History:   Procedure Laterality Date   . HYSTERECTOMY     . THYROIDECTOMY       Allergies   Allergen Reactions   . Pcn [Penicillins] Anaphylaxis   . Strawberry Rash     Fresh strawberries per patient.    . Sulfa Drugs Rash   . Aspirin-Dipyridamole Unspecified     pruritus   . Nitrous Oxide Other     Gets paranoid from, given at dentist before   . Trazodone Other     Nightmares   . Zolpidem Unspecified     Doesn't help sleep and gets anxious     Current Facility-Administered Medications   Medication Dose Route Frequency Provider Last Rate Last Dose   . amLODIPINE (NORVASC) tablet 5 mg  5 mg Oral Daily Rodden, Diane, MD   5 mg at 06/16/18 0826   . ascorbic acid (VITAMIN C) tablet 500 mg  500 mg Oral Daily Rodden, Diane, MD       .  aspirin chewable tablet 81 mg  81 mg Oral Daily Rodden, Diane, MD   81 mg at 06/15/18 0804   . cefTRIAXone (ROCEPHIN) 2,000 mg in sodium chloride 0.9 % 50 mL IVPB  2,000 mg IntraVENOUS Q24H NR Rodden, Diane, MD 100 mL/hr at 06/15/18 2013 2,000 mg at 06/15/18 2013   . chlorhexidine (PERIDEX) 0.12 % solution 15 mL  15 mL Mouth/Throat BID Mort Sawyers, MD   15 mL at 06/16/18 0826   . enoxaparin (LOVENOX) injection 60 mg  0.5 mg/kg Subcutaneous Daily Loni Dolly Ronny Flurry, MD       . gabapentin (NEURONTIN) capsule 300 mg  300 mg Oral TID Burns Spain, MD   300 mg at 06/16/18 0826   . HYDROcodone-acetaminophen (NORCO) 10-325 MG tablet 1 tablet  1 tablet Oral Q4H PRN Rodden, Diane, MD       . HYDROcodone-acetaminophen (NORCO) 5-325 MG tablet 1 tablet  1 tablet Oral Q4H PRN Rodden, Diane, MD   1  tablet at 06/14/18 1836   . lansoprazole (PREVACID) DR capsule 30 mg  30 mg Oral QAM AC Rodden, Diane, MD   30 mg at 06/14/18 0555   . levothyroxine (SYNTHROID) tablet 125 mcg  125 mcg Oral QAM AC Rodden, Diane, MD   125 mcg at 06/16/18 0531   . maalox-lidocaine viscous-diphenhydrAMINE 1:1:1 ratio (FIRST-MOUTHWASH BLM) oral solution 10 mL  10 mL Mouth/Throat 4x Daily PRN Mort Sawyers, MD       . metroNIDAZOLE (FLAGYL) IVPB 500 mg  500 mg IntraVENOUS Q8H Rodden, Diane, MD 100 mL/hr at 06/16/18 0531 500 mg at 06/16/18 0531   . multivitamin with minerals tablet 1 tablet  1 tablet Oral Daily Rodden, Diane, MD       . polyethylene glycol (MIRALAX) packet 17 g  17 g Oral Daily Rodden, Diane, MD   17 g at 06/16/18 0827   . senna (SENOKOT) tablet 17.2 mg  2 tablet Oral HS Rodden, Diane, MD   17.2 mg at 06/15/18 2014   . sertraline (ZOLOFT) tablet 25 mg  25 mg Oral Daily Rodden, Diane, MD   25 mg at 06/16/18 0826   . sodium chloride (PF) 0.9 % flush 3 mL  3 mL IntraVENOUS Q8H Mort Sawyers, MD   3 mL at 06/16/18 0531   . sodium chloride (PF) 0.9 % flush 3 mL  3 mL IntraVENOUS PRN Mort Sawyers, MD       . sodium chloride 0.9 % TKO infusion   IntraVENOUS Continuous PRN Mort Sawyers, MD         Family History  Mother with penicillin allergy  3 siblings with asthma and eczema    Social History:   Social History     Socioeconomic History   . Marital status: Single     Spouse name: Not on file   . Number of children: Not on file   . Years of education: Not on file   . Highest education level: Not on file   Occupational History   . Not on file   Social Needs   . Financial resource strain: Not on file   . Food insecurity:     Worry: Not on file     Inability: Not on file   . Transportation needs:     Medical: Not on file     Non-medical: Not on file   Tobacco Use   . Smoking status: Never Smoker   . Smokeless tobacco: Never Used  Substance and Sexual Activity   . Alcohol use: No     Frequency: Never   . Drug use: No   .  Sexual activity: Not on file   Lifestyle   . Physical activity:     Days per week: Not on file     Minutes per session: Not on file   . Stress: Not on file   Relationships   . Social connections:     Talks on phone: Not on file     Gets together: Not on file     Attends religious service: Not on file     Active member of club or organization: Not on file     Attends meetings of clubs or organizations: Not on file     Relationship status: Not on file   . Intimate partner violence:     Fear of current or ex partner: Not on file     Emotionally abused: Not on file     Physically abused: Not on file     Forced sexual activity: Not on file   Other Topics Concern   . Not on file   Social History Narrative   . Not on file     Vitals   06/16/18  0003 06/16/18  0105 06/16/18  0238 06/16/18  0715   BP:   112/56 116/57   Pulse:   72 66   Resp: '18 18 18 18   '$ Temp:   98.5 F (36.9 C) 97.9 F (36.6 C)   SpO2:   98% 99%     Laboratory values:  All labs reviewed pertinent labs below:  Lab Results   Component Value Date    WBC 5.7 06/15/2018    RBC 3.71 (L) 06/15/2018    HGB 11.4 06/15/2018    HCT 36.2 06/15/2018    MCV 97.6 (H) 06/15/2018    MCHC 31.5 (L) 06/15/2018    RDW 15.2 (H) 06/15/2018    PLT 285 06/15/2018    MPV 10.8 06/15/2018     Lab Results   Component Value Date    BUN 15 06/15/2018    CREAT 0.56 06/15/2018    CL 100 06/15/2018    NA 140 06/15/2018    K 3.3 (L) 06/15/2018     9.0 06/15/2018    TBILI 0.90 07/28/2017    ALB 3.3 (L) 07/28/2017    TP 8.1 (H) 07/28/2017    AST 178 (H) 07/28/2017    ALK 276 (H) 07/28/2017    BICARB 29 06/15/2018    ALT 166 (H) 07/28/2017    GLU 87 06/15/2018     Microbiology  Microbiology Results (last 7 days)     Procedure Component Value - Date/Time    Body Site Culture w/Gram Stain, Aerobic and Anaerobic Sterile Container Face/Neck Region [[id number]]  (Abnormal) Collected:  06/13/18 2123    Lab Status:  Preliminary result Specimen:  Swab from Face/Neck Region Updated:  06/16/18 0658        Gram Stain Result --     Heavy polymorphonucleated white blood cells  Moderate red blood cells  Moderate Gram positive cocci   Few Gram negative bacilli       Body Site Culture Result --     Light mixed skin microbiota  Further report to follow       Body Site Culture Eikenella corrodens  Heavy  Identification performed by [employer] [facility]( [facility]To[facility]). This test  was developed and its performance characteristics determined by [facility]. It has not been cleared or  approved by the U.S. Food and Drug Administration.  The FDA has  determined that such clearance or approval is not necessary.        Streptococcus anginosus  Moderate  Identification performed by [employer] [facility]( [facility]di-To[facility]). This test  was developed and its performance characteristics determined by [facility]. It has not been cleared or  approved by the U.S. Food and Drug Administration.  The FDA has  determined that such clearance or approval is not necessary.       Anaerobic Culture Result No growth of anaerobes to date    Narrative:       Left premaxillary odontogenic abscess    MRSA Surveillance Culture Angela Burke Nares [[id number]] Collected:  06/13/18 1840    Lab Status:  Final result Specimen:  Nares Updated:  06/15/18 1130     MRSA Surveillance Result No Methicillin Resistant Staphylococcus aureus isolated.        Assessment/Plan:  Ms. Schmuhl is a 67yo female with hx oc CVA w/ hemiplegia, hypothyroidism, uterine cancer, OSA s/p tracheostomy, obesity who is hospitalized for L medial midfacial abscess s/p I&D by ENT. She had failed outpatient treatment with clindamycin and is now on ceftriaxone and metronidazole. Per ID, would like to treat her for 2 weeks and with hx of penicillin allergy.    Ms. Narayan reaction to penicillin is remote (in childhood) and the details are unclear, however she does recall lip and eye swelling. To her knowledge, she has not received beta  lactams since then and I was not able to find that she had received any beta lactam antibiotics (through care everywhere) in the recent past or had any drug reactions. No history of skin blistering, sloughing, mucus membrane involvement and no reported or chart history of DRESS, SJS, TENS, AGEP to Penicillin. Recent literature strongly suggests that approximately 80-90% of individuals with a remote history of penicillin allergy are found to be non-allergic. Based on Ms. Kaplan's history of remote reaction, my suspicion for a current IgE-mediated hypersensitivity to penicillin is low. For the purpose of her facial infection, the goal is to switch her to an oral antibiotic to finish a 14 day course and preference per ID is augmentin, however as Ms. Bethune has multiple comorbidities verifying her penicillin allergy status will be important for her going forward as she will likely have future infections.     The possible courses of action are:  1) Choose an alternative to augmentin for her outpatient antibiotic course.        -This is always the preferred option. If this is pursued, please place referral to [facility] for penicillin skin testing and challenge in the future after discharge.    If augmentin is the drug of choice, the possible courses of action are:    2) skin testing to penicillin (skin prick and intradermal) followed by oral amoxicillin challenge.         -This is the preference as our suspicion for an IgE-mediated hypersensitivity is low. However, she did receive benadryl about 36 hours ago, which could compromise the test. We could try to wait until 48 hours, if she will remain in the hospital and use histamine control as an indicator of whether the test will be efficacious, as typically we ask patients to hold off all antihistamines for 1 week prior to skin testing.    2) direct oral amoxicillin challenge.        -  Newer data suggests that a skin testing is not absolutely necessary prior to  oral amoxicillin challenge in select healthy patients for whom the likelihood of IgE mediated sensitivity is low. However, Ms. Frances Maywood has multiple comorbidities and thus this would be inappropriate for her.     3) desensitization to penicillin      -This would require ICU admission with close observation and will necessarily prolong Ms. Biondo's hospital stay. As such, it may not be necessary, as she may not indeed be allergic.    After speaking with the primary team, our recommendation is as follows:  1) discuss with ID whether there are effective alternatives to augmentin that can treat her infection  2) discontinue all antihistamines: benadryl, zyrtec, claritin, allegra, hydroxyzine, etc  2) if augmentin is preferred, we will proceed with a trial of penicillin skin testing with oral amoxicillin challenge tomorrow, understanding that the test may not be effective as she has had benadryl within 48 hours.     Plan discussed with Dr. Almira Bar, attending    Lillia Corporal  Allergy/Immunology Fellow  06/16/18  8:54 AM

## 2018-06-17 ENCOUNTER — Other Ambulatory Visit: Payer: Self-pay

## 2018-06-17 DIAGNOSIS — I10 Essential (primary) hypertension: Secondary | ICD-10-CM

## 2018-06-17 DIAGNOSIS — Z880 Allergy status to penicillin: Secondary | ICD-10-CM

## 2018-06-17 LAB — CBC WITH DIFF, BLOOD
ANC-Automated: 2.8 10*3/uL (ref 1.6–7.0)
Abs Basophils: 0 10*3/uL (ref <0.1)
Abs Eosinophils: 0.1 10*3/uL (ref 0.1–0.5)
Abs Lymphs: 2 10*3/uL (ref 0.8–3.1)
Abs Monos: 0.6 10*3/uL (ref 0.2–0.8)
Basophils: 1 %
Eosinophils: 2 %
Hct: 39.1 % (ref 34.0–45.0)
Hgb: 12.5 gm/dL (ref 11.2–15.7)
Lymphocytes: 36 %
MCH: 31.2 pg (ref 26.0–32.0)
MCHC: 32 g/dL (ref 32.0–36.0)
MCV: 97.5 um3 — ABNORMAL HIGH (ref 79.0–95.0)
MPV: 10.6 fL (ref 9.4–12.4)
Monocytes: 10 %
Plt Count: 328 10*3/uL (ref 140–370)
RBC: 4.01 10*6/uL (ref 3.90–5.20)
RDW: 15.2 % — ABNORMAL HIGH (ref 12.0–14.0)
Segs: 51 %
WBC: 5.6 10*3/uL (ref 4.0–10.0)

## 2018-06-17 LAB — BASIC METABOLIC PANEL, BLOOD
Anion Gap: 16 mmol/L — ABNORMAL HIGH (ref 7–15)
BUN: 12 mg/dL (ref 8–23)
Bicarbonate: 27 mmol/L (ref 22–29)
Calcium: 9.5 mg/dL (ref 8.5–10.6)
Chloride: 101 mmol/L (ref 98–107)
Creatinine: 0.49 mg/dL — ABNORMAL LOW (ref 0.51–0.95)
GFR: >60 mL/min
Glucose: 79 mg/dL (ref 70–99)
Potassium: 3.6 mmol/L (ref 3.5–5.1)
Sodium: 144 mmol/L (ref 136–145)

## 2018-06-17 LAB — RBC MORPHOLOGY: Plt Est: ADEQUATE

## 2018-06-17 LAB — BODY SITE CULTURE W/GRAM STAIN, AEROBIC AND ANAEROBIC

## 2018-06-17 LAB — MAGNESIUM, BLOOD: Magnesium: 2 mg/dL (ref 1.6–2.4)

## 2018-06-17 LAB — PHOSPHORUS, BLOOD: Phosphorous: 3.2 mg/dL (ref 2.7–4.5)

## 2018-06-17 MED ORDER — PENICILLIN G SODIUM DILUTION 10,000 UNITS/ML
0.05 mL | Freq: Once | Status: AC
Start: 2018-06-17 — End: 2018-06-17
  Administered 2018-06-17: 0.05 mL via INTRADERMAL
  Filled 2018-06-17: qty 0.05

## 2018-06-17 MED ORDER — SODIUM CHLORIDE 0.9 % IV SOLN
2000.00 mg | INTRAVENOUS | Status: DC
Start: 2018-06-17 — End: 2018-06-18

## 2018-06-17 MED ORDER — BENZYLPENICILLOLOYL POLYLYSINE 0.25 ML ID SOLN
0.20 mL | Freq: Once | INTRADERMAL | Status: AC
Start: 2018-06-17 — End: 2018-06-17
  Administered 2018-06-17: 0.2 mL via INTRADERMAL
  Filled 2018-06-17: qty 0.2

## 2018-06-17 MED ORDER — AMOXICILLIN 250 MG OR CAPS
250.00 mg | ORAL_CAPSULE | Freq: Once | ORAL | Status: DC
Start: 2018-06-17 — End: 2018-06-17
  Filled 2018-06-17: qty 1

## 2018-06-17 MED ORDER — AMOXICILLIN-POT CLAVULANATE 875-125 MG OR TABS
1.00 | ORAL_TABLET | Freq: Two times a day (BID) | ORAL | 0 refills | Status: DC
Start: 2018-06-17 — End: 2018-06-18
  Filled 2018-06-17: qty 18, 9d supply, fill #0

## 2018-06-17 MED ORDER — CHLORHEXIDINE GLUCONATE 0.12 % MT SOLN
15.00 mL | Freq: Two times a day (BID) | OROMUCOSAL | 0 refills | Status: AC
Start: 2018-06-17 — End: 2018-07-10
  Filled 2018-06-23: qty 473, 16d supply, fill #0

## 2018-06-17 MED ORDER — BENZYLPENICILLOLOYL POLYLYSINE 0.25 ML ID SOLN
0.05 mL | Freq: Once | INTRADERMAL | Status: AC
Start: 2018-06-17 — End: 2018-06-17
  Administered 2018-06-17: 0.05 mL via INTRADERMAL
  Filled 2018-06-17: qty 0.05

## 2018-06-17 MED ORDER — THERA M PLUS OR TABS
1.00 | ORAL_TABLET | Freq: Every day | ORAL | 0 refills | Status: AC
Start: 2018-06-18 — End: ?
  Filled 2018-06-17 – 2018-06-23 (×3): qty 30, 30d supply, fill #0

## 2018-06-17 MED ORDER — EPINEPHRINE 0.3 MG/0.3ML IJ SOAJ
0.30 mg | Freq: Once | INTRAMUSCULAR | Status: DC | PRN
Start: 2018-06-17 — End: 2018-06-18
  Filled 2018-06-17: qty 0.3

## 2018-06-17 MED ORDER — POTASSIUM CHLORIDE CRYS CR 20 MEQ OR TBCR
40.00 meq | EXTENDED_RELEASE_TABLET | Freq: Once | ORAL | Status: DC
Start: 2018-06-17 — End: 2018-06-18
  Filled 2018-06-17: qty 2

## 2018-06-17 MED ORDER — AMOXICILLIN 250 MG OR CAPS
250.00 mg | ORAL_CAPSULE | Freq: Once | ORAL | Status: AC
Start: 2018-06-17 — End: 2018-06-17
  Administered 2018-06-17: 250 mg via ORAL
  Filled 2018-06-17: qty 1

## 2018-06-17 MED ORDER — PENICILLIN G SODIUM DILUTION 10,000 UNITS/ML
0.20 mL | Freq: Once | Status: AC
Start: 2018-06-17 — End: 2018-06-17
  Administered 2018-06-17: 0.2 mL via INTRADERMAL
  Filled 2018-06-17: qty 0.2

## 2018-06-17 MED ORDER — ASCORBIC ACID 500 MG OR TABS
500.00 mg | ORAL_TABLET | Freq: Every day | ORAL | 0 refills | Status: AC
Start: 2018-06-18 — End: ?
  Filled 2018-06-23: qty 30, 30d supply, fill #0

## 2018-06-17 NOTE — Progress Notes (Signed)
[facility] day:   4 days - Admitted on: 06/13/2018    ID: Emily Ball is a 67 year old female with a hx of CVA with left hemiplegia, hypothyroidism 2/2 thyroidectomy c/b stoma, T2DM, and GERD, presenting L medial midfacial abscess, s/p I&D on 9/1.    Interval Events / Subjective   - NAEO. Feels well this AM. Refused labs.  - Improved lip swelling, able to tolerate PO.  - No F/C/NS, dysphagia, odynophagia, SOB.    Physical Exam   Temp  Min: 97.9 F (36.6 C)  Max: 98.4 F (36.9 C)  Pulse  Min: 61  Max: 75  BP  Min: 120/52  Max: 142/44  Resp  Min: 17  Max: 18  SpO2  Min: 97 %  Max: 100 %    BP 142/44 (BP Location: Right arm, BP Patient Position: Supine)   Pulse 67   Temp 98.3 F (36.8 C)   Resp 17   Ht 5\' 9"  (1.753 m)   Wt 124 kg (273 lb 5.9 oz)   SpO2 97%   BMI 40.37 kg/m  O2 Device: None (Room air)    09/04 0600 - 09/05 0559  In: 860 [P.O.:860]  Out: 450 [Urine:450]  Urine x 0 Stool x 1 Emesis x 0     General: NAD, sitting comfortably in bed  HEENT: Mild swelling of upper lips, improved, with surrounding induration but no fluctuance.  CV: RRR, S1/S2 normal, no murmurs appreciated.  Lungs: CTAB, no increased respiratory effort.  Abdomen: NABS. Soft, NTND, no HSM.  Extremities: WWP, 2+ distal pulses, no edema.  Pertinent Labs and Imaging and Meds     WBC 5.6 (09/05) HGB 12.5 (09/05) PLT 328 (09/05)    HCT 39.1 (09/05)      Na 143 (09/04) CL 104 (09/04) BUN 14 (09/04) GLU   102* (09/04)   K 3.4* (09/04) CO2 29 (09/04) Cr 0.48* (09/04)      Abscess Cx 9/1: Eikenella corrodens (heavy), Streptococcus anginosus (moderate)    EKG: NSR with QTc in 470s.    Assessment / Plan   Emily Ball is a 67 year old female with a hx of CVA with left hemiplegia, hypothyroidism 2/2 thyroidectomy c/b stoma, T2DM, and GERD, presenting L medial midfacial abscess, s/p I&D on 9/1.    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 corrodens and S. anginosus on culture. CT at [facility] showed evidence of bony erosion c/f but osteomyelitis, but per ID, no need for extended antibiotic course. Unable to give fluoroquinolones as patient has prolonged QTc. Allergy consulted for PCN skin testing and amoxicillin challenge in PM today, which will guide antibiotic selection. Plan for a total course of 14 days (until about 9/14).  - Continue antibiotics until 9/14 (14-day course).     - Continue ceftriaxone 2 g IV Q2H.     - Continue metronidazole 500 mg PO Q6H.  - Plan for skin testing and amoxicillin challenge 9/5.  - Continue Peridex swish/spit BID for 5 days (9/1-6).  - Follow up with OMFS as outpatient after discharge.    #Stage II SDU: Wound care consulted. Recommendations implemented. Appreciate nursing care.    #History of transaminitis: LFTs elevated in 2018 but now normal. No indication for further workup.    #History of penicillin allergy: Childhood history of mucosal swelling, but appreciate Allergy recs.    Chronic Issues   #  Chronic pain: For residual pain after stroke, continue Norco 5-10 mg Q4H PRN for moderate/severe pain. Can give additional Norco 5 mg Q4H PRN as rescue dose. Continue home gabapentin 300 mg TID.  #History of CVA: With residual L hemiparesis. Continue home ASA 81 mg daily.  #Hypothyroidism: Clinically euthyroid. Continue home levothyroxine 125 mcg.  #GERD: No symptoms at this time. Continue home lansoprazole 15 mg daily.  #HTN: BP well controlled. Continue home amlodipine 5 mg daily.  #Anxiety: Controlled. Continue home sertraline 25 mg daily.    DCP: Pending antibiotic selection  VTE prophylaxis: Lovenox  Diet: Diet Mechanical Soft Texture; Carbohydrate Limited  IV fluids: No IV fluids  Access: PIVs  Code status: DNAR - Full Care    Patient care was discussed in detail with Juleen Starr, MD.    Mabeline Caras [place]) Rockney Ghee  PGY-2, Internal Medicine  Pager: [phone]

## 2018-06-17 NOTE — Plan of Care (Signed)
Problem: Promotion of Health and Safety  Goal: Promotion of Health and Safety  Description  The patient remains safe, receives appropriate treatment and achieves optimal outcomes (physically, psychosocially, and spiritually) within the limitations of the disease process by discharge.    Information below is the current care plan.  Outcome: Progressing  Flowsheets (Taken 06/17/2018 1502)  Guidelines: Inpatient Nursing Guidelines  Individualized Interventions/Recommendations #1: Assist patient in removing lids on meal items so that patient can feed herself independently.  Individualized Interventions/Recommendations #2 (if applicable): Place bottled waters with crystal light within reach so pt can hydrate.  Outcome Evaluation (rationale for progressing/not progressing) every shift: Patient alert and oriented. Assisted patient in setting up patient's meal trays. Patient ate 100% of meals. Placed crystal light water bottles within reach. Patient's urine is getting lighter amber. Will continue to monitor.

## 2018-06-17 NOTE — Interdisciplinary (Signed)
Physical Therapy Daily Treatment Note    Admitting Physician:  Juleen Starr, MD  Admission Date 06/13/2018    Inpatient Diagnosis:   Problem List       Codes    Impaired functional mobility and endurance     ICD-10-CM: Z74.09  ICD-9-CM: V49.89          IP Start of Service   Treatment start time: 1000  Start of Care: 06/15/18  Onset Date: 06/13/18  Reason for referral: Activity tolerance limitation;Range of motion/strength limitations;Decline in functional ability/mobility    Preferred [place]         Past Medical History:   Diagnosis Date   . Cholangitis    . CVA (cerebral vascular accident) (CMS-HCC)    . Hypertension    . Left-sided muscle weakness    . Obesity    . Stroke (CMS-HCC)    . Uterine cancer (CMS-HCC)       Past Surgical History:   Procedure Laterality Date   . HYSTERECTOMY     . THYROIDECTOMY         PT Acute     Row Name 06/17/18 1000          Type of Visit    Type of Physical Therapy note  Physical Therapy Daily Treatment Note     Row Name 06/17/18 1000          Treatment Precautions/Restrictions    Precautions/Restrictions  Fall     Row Name 06/17/18 1000          Subjective    Subjective Information  Patient agreeable to ROM exercises only; politely declined bed mobility and sitting at EOB as she had just had to move to be cleaned. Pt also said she would like to defer sitting at EOB while in the hospital.      Patient status  Patient agreeable to treatment;Nursing in agreement for treatment     Row Name 06/17/18 1000          Pain Assessment    Pain Asssessment Tool  Numeric Pain Rating Scale     Row Name 06/17/18 1000          Numeric Pain Rating Scale    Location  Did not quantify pain today; reported having some pain during PROM exercises; pain generalized in L LE.      [place] Name 06/17/18 1000          Overall Cognitive Status  Intact - no cognitive limitations or impairments noted    Communication  No communication limitations or impairments noted. Current status of hearing, speech and  vision allow functional communication.    Coordination/Motor control  Other (comment) L LE flaccid    Balance  Balance limitations present    Static Sitting Balance  Not tested    Dynamic Sitting Balance  Not tested    Static Standing Balance  Not tested    Dynamic Standing Balance  Not tested    Other Balance Information  Pt declined sitting at EOB.    Extremity Assessment  Range of motion, strength,  muscle tone and/or sensation limitations present    Functional Mobility  Functional mobility deficits present    Bed Mobility  Other (comments) Pt deferred today    Other Objective Findings Patient resting supine in bed upon therapist arrival.     ROM  PROM for L DF <> PF x20, hip abd <> add x10, heel slides x10. Static hold stretch for L ankle PFs  2x60 seconds; hip adductors x 60 seconds; hamstring x45 seconds.    Pt left reclined in bed, call light within reach, bed alarm on, all needs met.              Eval cont.     [place] Name 06/17/18 1000          [place] AM-PAC: Basic Mobility    Assistance Needed to Turn from Back to Side While in a Flat Bed Without Using Bedrails  2 - A lot (mod/max assist)     Difficulty with Supine to Sit Transfer  2 - A lot (mod/max assist)     How Much Help Needed to Move to/from Bed to Chair  1 - Total (dependent)     Difficulty with Sit to Stand Transfer from Chair with Arms  1 - Total (dependent)     How Much Help Needed to Walk in Room  1 - Total (dependent)     How Much Help Needed to Climb 3-5 Steps with a Rail  1 - Total (dependent)     AMPAC Total Score  8     Assessment: AM-PAC Basic Mobility Impairment Rating  Score 7-8 - 80-99% impaired     Row Name 06/17/18 1000          Patient/Family Education    Learner(s)  Patient     Learner response to rehab patient education interventions  Verbalizes understanding;Needs reinforcement     Row Name 06/17/18 1000          Assessment  Pt is a 67 year old female with PMH of CVA w/ left hemiplegia, hypothyroidism 2/2 thyroidectomy, uterine  cancer s/p TAH/BSO, OSA s/p tracheostomy, morbid obesity, T2DM, HTN, GERD, anxiety who presented to the ED two days ago due to 1 week h/o progressive, 9/10, left sided facial swelling and tenderness.     Pt able to participate in therapy today, but only agreeable to therex in bed. Pt politely declined sitting at EOB during hospital admission. Educated pt on benefits/importance of sitting upright, but pt expressed that she does not want to sit at EOB. Able to tolerate ROM exercises in bed and reported that her friends assist her with ROM exercises at baseline. Pt otherwise appears at/near baseline ability and reported that she has no concerns dc'ing home with ongoing caregiver and friend support. Plan for another session to assess pt's ability to roll for pressure relief to meet goal set at evaluation.       Recommend HH PT to train pt's caregiver and friends on using lift equipment to assist pt with OOB activities. Also consider OP PT referral to continue progressing functional activities.     Rehab Potential  [place] Name 06/17/18 1000          Patient stated Goal    Patient stated goal  none stated     Row Name 06/17/18 1000          Planned Therapy Interventions and Rationale    Manual Therapy  to improve joint and soft tissue mobility and alignment     Therapeutic Activities  to improve transfers between surfaces;to restore functional performace using graded activities     Theraputic Exercise  to increase range of motion to allow greater independence with functional mobility skills;to increase strength to allow greater independence with functional mobility skills;to improve activity tolerance to allow greater independence with functional mobility skills     Row Name 06/17/18 1000  Treatment Plan Disussion    Treatment Plan Discussion and Agreement  Patient/family/caregiver stated understanding and agreement with the therapy plan;No family/caregiver available     Row Name 06/17/18 1000           Treatment Plan    Continue therapy to address  Range of Motion/Strength limitations;Decline in functional ability/mobility;Activity tolerance limitation     Frequency of treatment  2 times per week     Duration of treatment (number of visits)  4 visits     Status of treatment  Patient evaluated and will benefit from ongoing skilled therapy     Row Name 06/17/18 1000          Patient Safety Considerations    Patient safety considerations  Patient returned to bed at end of treatment     [place] Name 06/17/18 1000          Post Acute Discharge Recommendations    Discharge Rehabilitation Reccomendations ([place])  Patient would benefit from home safety evaluation upon discharge;If medically appropriate and available, patient demonstrates tolerance to participate in skilled therapy at the following anticipated level     Therapy level  Home health training on use of hoyer lift with caregiver and friends.      Equipment recommendations  No equipment needed - patient has own equipment     [place] Name 06/17/18 1000          Therapy Plan Communication    Therapy Plan Communication  Discussed therapy plan with Nursing and/or Physician     Row Name 06/17/18 1000          Physical Therapy Patient Discharge Instructions    Your Physical Therapist suggests the following  Continue to complete your home exercise program daily as instructed;Continue to follow your prescribed mobility precautions when moving in and out of bed and walking  as instructed;Continue to use correct body mechanics when moving in and out of bed as instructed     [place] Name 06/17/18 1000          Therapeutic Procedures    Therapeutic exercise  ([id number])   Assisted exercises with tactile cues/facilitation to increase muscle recruitment;Range of motion exercises;Home Exercise Program (HEP) demonstration and performance        Total TIMED Treatment (min)   15       Therapeutic exercise    See objective     Row Name 06/17/18 1000          Treatment Time     Total TIMED  Treatment  (min)  15     Total Treatment Time (min)  15     Treatment start time  1000           The physical therapist of record is endorsed by evaluating physical therapist.

## 2018-06-17 NOTE — Interdisciplinary (Signed)
PT Contact     Row Name 06/17/18 4739       Therapy Contact Note    Contact Time  5844    Therapy not provided at this time as  Other (comment). Attempted to see pt this two times this AM. First time, pt was taking morning meds with RN. Second time, pt reported having a BM and is waiting to be cleaned. Will try to see pt at another time when pt is available for therapy.

## 2018-06-17 NOTE — Plan of Care (Signed)
Problem: Promotion of Health and Safety  Goal: Promotion of Health and Safety  Description  The patient remains safe, receives appropriate treatment and achieves optimal outcomes (physically, psychosocially, and spiritually) within the limitations of the disease process by discharge.    Information below is the current care plan.  Outcome: Progressing  Flowsheets  Taken 06/16/2018 1922 by Huntley Estelle, RN  Patient /Family stated Goal: wants tissues  Taken 06/16/2018 1345 by Stefani Dama Dizon, RN  Guidelines: Inpatient Nursing Guidelines  Taken 06/17/2018 0327 by Huntley Estelle, RN  Individualized Interventions/Recommendations #1: Cluster care to promote rest  Individualized Interventions/Recommendations #2 (if applicable): pull pt up in bed and reposition  Individualized Interventions/Recommendations #3 (if applicable): provide 2 boxes of tissue per request  Outcome Evaluation (rationale for progressing/not progressing) every shift: pt wanting 2 boxes of tissues at start of stiff. Provided them promptly to gain trust. Pt noncompliant with chlorohexidine mouth rinse, states she just used the mouthwash. Pt complaining of pain in her L side, pulling up and repositioning helped. Responds well to clustered care to rest through the night. will continue to monitor.

## 2018-06-17 NOTE — Interdisciplinary (Signed)
[id number] is sending a message to: Marcine Matar / [phone]    Pt. Emily Ball, Rm [phone]. Pt refused labs this AM. Will try again later.  Pt continues to refuse PIV placement for IV abx.

## 2018-06-17 NOTE — Progress Notes (Signed)
Infectious Diseases Progress Note    Date of service: 06/17/18    Events: EKG revealed QTc of 477, advised primary team to hold Moxifloxacin until penicillin skin testing results are obtained.     Subjective: Patient is well-appearing. She reports that she has minimal, painless swelling in inside of left cheek due to chewing on it as she eats. She states that this problem has occurred many times in the past. Denies any other associated symptoms.     ANTIMICROBIALS:  Current:  Ceftriaxone IV 2g q24h 09/01-present  Metronidazole IV 500mg  q8h 09/01-present    Prior:  Ciprofloxacin 05/2017  Flagyl 05/2017    PHYSICAL EXAM  Temperature:  [97.9 F (36.6 C)-98.4 F (36.9 C)] 98.3 F (36.8 C) (09/05 0801)  Blood pressure (BP): (120-139)/(41-52) 139/51 (09/05 0801)  Heart Rate:  [61-75] 61 (09/05 0801)  Respirations:  [17-18] 17 (09/05 0801)  Pain Score: 0 (09/05 0801)  O2 Device: None (Room air) (09/05 0049)  SpO2:  [97 %-100 %] 97 % (09/05 0801)      QJJ:HERD appearing, pleasant.  EYES:PERLLA, EOM intact.  HENT:No swelling of left maxillary area, no tenderness to palpation, no fluctuance, no discharge, no erythema. Patient complains of strain on base of lower left neck, no pain elicited with palpation. Mild ulceration visualized on inside of left cheek secondary to chewing.   NODES:No lymphadenopathy.  CV:RRR, S1/S2 normal, no m/r/g.  PULM:CTAB, no r/r/w, no increased WOB.  EYC:XKGY, NTND, normal bowel sounds.   EXT:No edema, cyanosis, clubbing.  SKIN:No rashes, lesions, jaundice, cyanosis.  NEURO:AOx4, attentive, cooperative, CN II-XII intact, strength 4/5 on RUE and RLE, strength 0/5 on LUE. and LLE, sensation intact and sensitive on LUE and LLE.  PSYCH: Normal mood and affect, no SI or HI.    All pertinent labs reviewed in EPIC and notable for:  Patient refused lab draw this morning.    MICRO:  Microbiology Results (last 30 days)     Procedure Component Value - Date/Time    Body Site Culture w/Gram  Stain, Aerobic and Anaerobic Sterile Container Face/Neck Region [[id number]]  (Abnormal) Collected:  06/13/18 2123    Lab Status:  Preliminary result Specimen:  Swab from Face/Neck Region Updated:  06/16/18 1025     Gram Stain Result --     Heavy polymorphonucleated white blood cells  Moderate red blood cells  Moderate Gram positive cocci   Few Gram negative bacilli       Body Site Culture Result --     Light mixed skin microbiota  Further report to follow       Body Site Culture Eikenella corrodens  Heavy  Identification performed by [employer] [facility]( [facility]di-To[facility]). This test  was developed and its performance characteristics determined by [facility]. It has not been cleared or  approved by the U.S. Food and Drug Administration.  The FDA has  determined that such clearance or approval is not necessary.        Streptococcus anginosus  Moderate  Identification performed by [employer] [facility]( [facility]-To[facility]). This test  was developed and its performance characteristics determined by [facility]. It has not been cleared or  approved by the U.S. Food and Drug Administration.  The FDA has  determined that such clearance or approval is not necessary.       Anaerobic Culture Result No growth of anaerobes to date    Narrative:       Left premaxillary odontogenic abscess  MRSA Surveillance Culture Angela Burke Nares [[id number]] Collected:  06/13/18 1840    Lab Status:  Final result Specimen:  Nares Updated:  06/15/18 1130     MRSA Surveillance Result No Methicillin Resistant Staphylococcus aureus isolated.            IMAGING (personally reviewed by me and notable for):  9/1: CT from OSH revealed left premaxillary hypodensity w/ rim enhancement.    Assessment and Plan:  Patient is a 67 year old female w/ PMH ofCVA w/ left hemiplegia, hypothyroidism 2/2 thyroidectomy, uterine cancer s/p TAH/BSO, OSA s/p tracheostomy, morbid obesity, T2DM, HTN, GERD, anxiety who  presented to the [facility] 9/1 with a 1 week history of progressive left, medial, midfacial tenderness and swelling. She had been evaluated as outpatient prior to admission and prescribed clindamycin, which she did not finish the antibiotic course for w/ 3 tablets remaining. On day of admission, she was evaluated by OSH in which CT revealed left premaxillary hypodensity w/ rim enhancement. We reviewed this imaging, and while there certainly is evidence of maxillary erosion, we do not feel this should be treated for prolonged course as you would a long-bone OM.Infections in this area tend to do well.That being said, she has extensively poor dentition, so we would recommend extending treatment for odontogenic abscess for a bit longer.She will need f/u with OMFS in the outpatient.     Upon admission, patient had 100.8 temp and 8.7 WBC. Patient was started on ceftriaxone 2g q24h and metronidazole 500mg  q8h. ENT was consulted and performed I&D, draining purulent material. On 9/3, patient was afebrile w/ WBC 5.7. On 9/5, patient reported 0/10 pain and no swelling in maxillary area. Cultures revealed heavy Eikenella Corrodens and moderate Streptococcus Anginosus. Allergy has been consulted to evaluate whether she can be trialed on a pencillin (augmentin).     #Left premaxillary odontogenic abscess  --Continue IV Ceftriaxone, PO Flagyl  --Patient does NOT have any PO alternatives to Augmentin since her QTc is too long for a fluoroquinolone  --Please hold fluoroquinolone use due to prolonged QTc   --Please proceed with PCN skin testing and PO amox challenge per Allergy, if negative then recommend initiating Augmentin 875mg  PO q12h.     Recommendations discussed with/paged to the primary team. The history, physical exam, assessment and plan have been discussed with Dr. Lake Bells.     Brayton Layman  MS4    ATTENDING ATTESTATION    I was physically present with the medical student during the performance of the history, examination  and medical decision making. I reviewed and agree with the findings and plan as documented by the medical student. My additions and revisions are included in the record. I personally performed or re-performed the exam and medical decision making and agree with the medical student findings. Key plans for today: Appreciate Allergy consult for PO challenge of amox.  If this goes will, will plan to treat with augmentin to complete a 14 day course in total.

## 2018-06-17 NOTE — Interdisciplinary (Signed)
Pt refusing labs at this time, states she will wait for second round.

## 2018-06-17 NOTE — Progress Notes (Signed)
Allergy/Immunology Progress Note    ID:  Emily Ball is a 67yo female with hx oc CVA w/ hemiplegia, hypothyroidism, uterine cancer, OSA s/p tracheostomy, obesity who is hospitalized for L medial midfacial abscess s/p I&D by ENT. She had failed outpatient treatment with clindamycin and is now on ceftriaxone and metronidazole. Per ID, would like to finish her 2 week course of antibiotics with augmentin. Pt has a penicillin allergy listed on her medical record, lip and eye swelling as a child.     Interval Events: No acute events overnight. Afebrile. QTC prolonged, so moxifloxacin not preferable. Plan to proceed with penicillin skin testing and oral amoxicillin challenge.    Subjective: Feels well today. Denies shortness of breath, coughing, itching, hives, chest pain, lightheadedness. No pain in her left side mouth or face, she feels like it is healing well.     Objective:   06/16/18  1413 06/17/18  0049 06/17/18  0801 06/17/18  0914   BP: 120/52 122/41 139/51 142/44   Pulse: 75 67 61 67   Resp: _0 Temp: 97.9 F (36.6 C) 98.4 F (36.9 C) 98.3 F (36.8 C)    SpO2: 100% 99% 97%      Physical Exam   Constitutional: She appears well-developed and well-nourished. No distress.   HENT:   Mouth/Throat: Oropharynx is clear and moist.   Eyes: Pupils are equal, round, and reactive to light. Right eye exhibits no discharge. Left eye exhibits no discharge. No scleral icterus.   Cardiovascular: Normal rate, regular rhythm and normal heart sounds. Exam reveals no friction rub.   No murmur heard.  Pulmonary/Chest: Effort normal and breath sounds normal. No respiratory distress. She has no wheezes. She has no rales.   Abdominal: Soft. She exhibits no distension and no mass. There is no tenderness. There is no guarding.   Obese     Musculoskeletal: She exhibits no edema.   Neurological: She is alert.   Skin: Skin is warm. No rash noted. She is not diaphoretic. No erythema.     Laboratory Values  Lab Results   Component  Value Date    WBC 5.6 06/17/2018    RBC 4.01 06/17/2018    HGB 12.5 06/17/2018    HCT 39.1 06/17/2018    MCV 97.5 (H) 06/17/2018    MCHC 32.0 06/17/2018    RDW 15.2 (H) 06/17/2018    PLT 328 06/17/2018    MPV 10.6 06/17/2018     Lab Results   Component Value Date    BUN 14 06/16/2018    CREAT 0.48 (L) 06/16/2018    CL 104 06/16/2018    NA 143 06/16/2018    K 3.4 (L) 06/16/2018    [place] 8.9 06/16/2018    TBILI 0.16 06/16/2018    ALB 3.0 (L) 06/16/2018    TP 7.5 06/16/2018    AST 10 06/16/2018    ALK 65 06/16/2018    BICARB 29 06/16/2018    ALT 6 06/16/2018    GLU 102 (H) 06/16/2018     Microbiology  Body site culture 9/1 Eikenella, Strep anginosus  MRSA nares negative    Assessment/Plan:  Emily Ball is a 67yo female with complex medical history hospitalized for L midfacial abscess with allergy to PCN on her medical record and remote history of reaction as a child. Per ID, preferred agent is augmentin. QTC found to be prolonged, so fluoroquinolones contraindicated. We will recommend penicillin skin testing and challenge.    Penicillin  G/Pre-Pen skin prick and intradermal testing performed today.  Time Out: performed at 13:00 PM.  Correct patient:  Yes, Correct site: Yes. Correct procedure: Yes.    Correct patient name and procedure confirmed. Consent placed in chart.    Testing performed on right forearm only, as left arm hypersensitive in setting of hemiplegia after CVA.      Epicutaneous skin testing (reported as wheal/flare)   Pen G 10,000 U/mL: 0/0   Pre-Pen 1 ampule: 0/0      Saline (0/0) and histamine controls were also placed with appropriate response.               Histamine 8/15     Intradermal testing (reported as wheal/flare):   Pen G 10,000 U/mL: 0/0    Pre-Pen 1 ampule: 0/0   Saline: 0/0     Interpretation: Negative skin prick and intradermal testing to Pre-Pen (major determinant) and Pen G (minor determinant). Patient tolerated procedure without complication.    Oral Amoxicillin challenge:  Amoxicillin 250  mg PO x 1 challenge performed according to protocol. Patient monitored x 1 hour and vital signs checked every 15 minutes. Patient tolerated the amoxicillin without immediate hypersensitivity reaction. This does not rule out the possibility of delayed reaction or rash, or non-IgE mediated reactions including AGEP/SJS/TEN, DRESS, hemolytic anemia or interstitial nephritis.     Penicillin allergy removed from chart.    Plan discussed with Dr. Almira Bar, attending    Lillia Corporal, MD  Allergy/Immunology Fellow [place]

## 2018-06-18 ENCOUNTER — Other Ambulatory Visit: Payer: Self-pay

## 2018-06-18 DIAGNOSIS — F32A Depression, unspecified: Secondary | ICD-10-CM | POA: Diagnosis present

## 2018-06-18 DIAGNOSIS — F329 Major depressive disorder, single episode, unspecified: Secondary | ICD-10-CM

## 2018-06-18 DIAGNOSIS — G629 Polyneuropathy, unspecified: Secondary | ICD-10-CM

## 2018-06-18 DIAGNOSIS — R9431 Abnormal electrocardiogram [ECG] [EKG]: Secondary | ICD-10-CM

## 2018-06-18 DIAGNOSIS — Z8673 Personal history of transient ischemic attack (TIA), and cerebral infarction without residual deficits: Secondary | ICD-10-CM

## 2018-06-18 DIAGNOSIS — K219 Gastro-esophageal reflux disease without esophagitis: Secondary | ICD-10-CM

## 2018-06-18 DIAGNOSIS — I4581 Long QT syndrome: Secondary | ICD-10-CM

## 2018-06-18 DIAGNOSIS — Z880 Allergy status to penicillin: Secondary | ICD-10-CM

## 2018-06-18 LAB — ECG 12-LEAD
ATRIAL RATE: 69 {beats}/min
ECG INTERPRETATION: NORMAL
P AXIS: 68 degrees
PR INTERVAL: 164 ms
QRS INTERVAL/DURATION: 88 ms
QT: 446 ms
QTC INTERVAL: 477 ms
R AXIS: -6 degrees
T AXIS: 186 degrees
VENTRICULAR RATE: 69 {beats}/min

## 2018-06-18 LAB — BASIC METABOLIC PANEL, BLOOD
Anion Gap: 10 mmol/L (ref 7–15)
BUN: 13 mg/dL (ref 8–23)
Bicarbonate: 28 mmol/L (ref 22–29)
Calcium: 8.9 mg/dL (ref 8.5–10.6)
Chloride: 101 mmol/L (ref 98–107)
Creatinine: 0.51 mg/dL (ref 0.51–0.95)
GFR: >60 mL/min
Glucose: 89 mg/dL (ref 70–99)
Potassium: 3.7 mmol/L (ref 3.5–5.1)
Sodium: 139 mmol/L (ref 136–145)

## 2018-06-18 LAB — MAGNESIUM, BLOOD: Magnesium: 2.1 mg/dL (ref 1.6–2.4)

## 2018-06-18 LAB — PHOSPHORUS, BLOOD: Phosphorous: 4 mg/dL (ref 2.7–4.5)

## 2018-06-18 MED ORDER — AMOXICILLIN-POT CLAVULANATE 875-125 MG OR TABS
1.00 | ORAL_TABLET | Freq: Two times a day (BID) | ORAL | 0 refills | Status: AC
Start: 2018-06-18 — End: 2018-07-03
  Filled 2018-06-18 – 2018-06-23 (×3): qty 17, 9d supply, fill #0

## 2018-06-18 MED ORDER — PNEUMOCOCCAL VAC POLYVALENT 25 MCG/0.5ML IJ INJ (CUSTOM)
0.5000 mL | INJECTION | INTRAMUSCULAR | Status: DC
Start: 2018-06-18 — End: 2018-06-18

## 2018-06-18 MED ORDER — AMOXICILLIN-POT CLAVULANATE 875-125 MG OR TABS
1.00 | ORAL_TABLET | Freq: Two times a day (BID) | ORAL | Status: DC
Start: 2018-06-18 — End: 2018-06-18
  Administered 2018-06-18: 1 via ORAL
  Filled 2018-06-18: qty 1

## 2018-06-18 MED FILL — ASCORBIC ACID 500 MG OR TABS: 500 MG | ORAL | 30 days supply | Qty: 30 | Fill #0

## 2018-06-18 MED FILL — CHLORHEXIDINE GLUCONATE 0.12 % MT SOLN: 0.12 % | OROMUCOSAL | 16 days supply | Qty: 473 | Fill #0

## 2018-06-18 NOTE — Plan of Care (Signed)
Problem: Promotion of Health and Safety  Goal: Promotion of Health and Safety  Description  The patient remains safe, receives appropriate treatment and achieves optimal outcomes (physically, psychosocially, and spiritually) within the limitations of the disease process by discharge.    Information below is the current care plan.  Outcome: Progressing  Flowsheets  Taken 06/18/2018 0730 by Antony Odea, RN  Patient /Family stated Goal: get out of here  Taken 06/18/2018 1043 by Antony Odea, RN  Guidelines: Inpatient Nursing Guidelines  Outcome Evaluation (rationale for progressing/not progressing) every shift: discharge orders in for pt, all belongings put together, spoke with SW and states pt to be picked up by ambulance at 1400, will f/u  Taken 06/18/2018 0259 by Alda Berthold, RN  Individualized Interventions/Recommendations #1: Provide patient with tissue boxes as needed for productive cough.  Individualized Interventions/Recommendations #2 (if applicable): Reinforce importance of repositioning q2hrs.  Individualized Interventions/Recommendations #3 (if applicable): Provide patient with personal belongings bag for possible discharge home in the morning.

## 2018-06-18 NOTE — Progress Notes (Signed)
Infectious Diseases Progress Note    Date of service: 06/18/18    Events: No acute events.    Subjective: Patient is well-appearing and states that she has no complaints or issues.    ANTIMICROBIALS:  Current:  Augmentin PO 875/125mg  q12h 09/06-present  Metronidazole PO 500mg  q8h 09/01-present    Prior:  Ciprofloxacin 05/2017  Flagyl08/2018  Ceftriaxone IV 2g q24h 09/01-09/05    PHYSICAL EXAM  Temperature:  [97.7 F (36.5 C)-98.4 F (36.9 C)] 98.4 F (36.9 C) (09/06 0732)  Blood pressure (BP): (132-148)/(44-59) 132/59 (09/06 0804)  Heart Rate:  [67-79] 72 (09/06 0804)  Respirations:  [18] 18 (09/06 0732)  Pain Score: 0 (09/06 0732)  O2 Device: None (Room air) (09/06 0732)  SpO2:  [98 %-99 %] 99 % (09/06 0732)  JQB:HALP appearing, pleasant.  EYES:PERLLA, EOM intact.  HENT:Noswelling of left maxillary area, no tenderness to palpation, no fluctuance, no discharge, no erythema.Patient complains of strain on base of lower left neck, no pain elicited with palpation.  NODES:No lymphadenopathy.  CV:RRR, S1/S2 normal, no m/r/g.  PULM:CTAB, no r/r/w, no increased WOB.  FXT:KWIO, NTND, normal bowel sounds.  EXT:No edema, cyanosis, clubbing.  SKIN:No rashes, lesions, jaundice, cyanosis.  NEURO:AOx4, attentive, cooperative, CN II-XII intact, strength 4/5 on RUE and RLE, strength 0/5 on LUE. and LLE, sensation intact and sensitive on LUE and LLE.  PSYCH: Normal mood and affect, no SI or HI.    All pertinent labs reviewed in EPIC and notable for:  No abnormal lab values.     MICRO:  Microbiology Results (last 30 days)     Procedure Component Value - Date/Time    Body Site Culture w/Gram Stain, Aerobic and Anaerobic Sterile Container Face/Neck Region [[id number]]  (Abnormal)  (Susceptibility) Collected:  06/13/18 2123    Lab Status:  Preliminary result Specimen:  Swab from Face/Neck Region Updated:  06/18/18 0757     Gram Stain Result --     Heavy polymorphonucleated white blood cells  Moderate red blood  cells  Moderate Gram positive cocci   Few Gram negative bacilli       Body Site Culture Result Light mixed skin microbiota     Body Site Culture Eikenella corrodens  Heavy  Identification performed by [employer]( Maldi-ToF). This test  was developed and its performance characteristics determined by [facility]. It has not been cleared or  approved by the U.S. Food and Drug Administration.  The FDA has  determined that such clearance or approval is not necessary.        Streptococcus anginosus  Moderate  Identification performed by [employer] [facility]( Maldi-ToF). This test  was developed and its performance characteristics determined by [facility]. It has not been cleared or  approved by the U.S. Food and Drug Administration.  The FDA has  determined that such clearance or approval is not necessary.       Anaerobic Culture Prevotella  Heavy  (Organism ID: Prevotella baroniae)  Identification performed by [employer]( Maldi-ToF). This test  was developed and its performance characteristics determined by [facility]. It has not been cleared or  approved by the U.S. Food and Drug Administration.  The FDA has  determined that such clearance or approval is not necessary.      Narrative:       Left premaxillary odontogenic abscess    Susceptibility      Streptococcus anginosus     MIC  Ceftriaxone Susceptible [1]      Penicillin G Susceptible [2]             [1]   HIGH risk of C. diff infection     [2]   MODERATE risk of C. diff infection                 MRSA Surveillance Culture Angela Burke Nares [[id number]] Collected:  06/13/18 1840    Lab Status:  Final result Specimen:  Nares Updated:  06/15/18 1130     MRSA Surveillance Result No Methicillin Resistant Staphylococcus aureus isolated.            IMAGING (personally reviewed by me and notable for):  9/1:CT from OSH revealed left premaxillary hypodensity w/ rim  enhancement.    Assessment and Plan:  Patient is a 67 year old female w/ PMH ofCVA w/ left hemiplegia, OSA s/p tracheostomy, and numerous chronic conditions who presented to the [facility] 9/1 with a 1 week history of progressive left, medial, midfacial tenderness and swelling accompanied by fever. On day of admission, she was evaluated by OSH in which CT revealed left premaxillary hypodensity w/ rim enhancement.We reviewed this imaging, and while there certainly is evidence of maxillary erosion, we do not feel this should be treated for prolonged course as you would a long-bone OM. She will need f/u with OMFS in the outpatient.     I&D was performed and cultures revealed heavy Eikenella Corrodens, moderate Streptococcus Anginosus, heavy Prevotella. Patient underwent penicillin skin test and oral amoxicillin challenge without any reaction, permitting Augmentin therapy. Patient began oral Augmentin therapy today without any issues.     #Left premaxillary odontogenic abscess  --Recommend continuing PO Augmentin 875mg  q12h for 14 total days of therapy.  --Recommend outpatient f/u with OMFS.     Recommendations discussed with/paged to the primary team. The history, physical exam, assessment and plan have been discussed with Dr. Lake Bells.     ID to sign off.     Brayton Layman  MS4    ATTENDING ATTESTATION    I was physically present with the medical student during the performance of the history, examination and medical decision making. I reviewed and agree with the findings and plan as documented by the medical student. My additions and revisions are included in the record. I personally performed or re-performed the exam and medical decision making and agree with the medical student findings. Key plans for today: clinically improved.  Will continue augmentin to complete a course of therapy as above.  Can remove PCN from allergy list.  Needs dental/OMFS follow up.

## 2018-06-18 NOTE — Interdisciplinary (Signed)
06/18/18 1036   Assessment   Assessment Type Progress/Follow-up   Referral Information   Referral Type Other (Comment)       SW follow-up: transportation      SW notified by MD of patient's dc home today.   SW verified patient's address with patient at bedside  SW attempted to arrange transport with [facility] via allscripts. Per [facility], given patient's insurance,patient's insurance require patient to go through [facility] first.  SW initiated referral to [facility] for BLS transport to patient's home   [facility] able to accept patient and waiting on auth from [place].  SW followed up with Klye at [facility] [phone]. Per Marylyn Ishihara, awaiting auth but will notify SW when obtained.  SW received callback from [facility] and informed SW that they spoke with higher ups and can transport patient. SW informed [facility] that another provider has already agreed to transport patient and reached out to insurance for auth.      SW waiting notification from [facility] that Josem Kaufmann has been received from patient's insurance for transport.     10:49--SW received call from [facility]. Per Marylyn Ishihara, received auth from [employer] and transport arranged for 2:00 pick up to transport patient home to --  [address]   [place] [address]     SW notified MD/RN.    No other needs identified or verbalized.

## 2018-06-18 NOTE — Interdisciplinary (Addendum)
06/18/18 1053   Social Determinates of Engineer, agricultural [facility]-   Transport arrange for 2pm pick up to transport to   [address]   [address]   [place] [address]        Transportation Phone Number [phone]   Transportation Ambulance   [facility] [place] Yes   Name, Relationship and Phone Number of Person Engaged in the Discharge Plan pt report Caregiver Clarene Critchley to meet patient in home to receive patient

## 2018-06-18 NOTE — Discharge Summary (Signed)
Date of Admission: 06/13/2018  Date of Discharge: 06/18/2018    Patient Name: Emily Ball    Principal Diagnosis: Facial abscess    Hospital Problem List  Active Hospital Problems    Diagnosis   . *Facial abscess [L02.01]   . Depression [F32.9]   . History of CVA (cerebrovascular accident) [Z86.73]   . GERD (gastroesophageal reflux disease) [K21.9]   . Peripheral neuropathy [G62.9]   . 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 Hypertension [I10]   . Hypothyroidism [E03.9]   . Hemiplegia, unspecified etiology, unspecified hemiplegia type, unspecified laterality (CMS-HCC) [G81.90]      Resolved Hospital Problems    Diagnosis   . History of penicillin allergy [Z88.0]     Principal Procedure During This Hospitalization: Incision and drainage of periodontal abscess (06/13/2018)    Other Procedures Performed During This Hospitalization: None    Consultations Obtained During This Hospitalization:  Allergy  Head & Neck Surgery  Infectious Diseases  Wound Care Service    Key consultant recommendations:  Allergy: Emily Ball is a 67yo female with complex medical history hospitalized for L midfacial abscess with allergy to PCN on her medical record and remote history of reaction as a child. Per ID, preferred agent is augmentin. QTC found to be prolonged, so fluoroquinolones contraindicated. We will recommend penicillin skin testing and amoxicillin challenge. Penicillin allergy removed from chart as testing negative.    Head & Neck Surgery: 67 year old female with Left medial midfacial abscess, likely odontogenic, status post uncomplicated bedside incision and drainage.  - No evidence of residual abscess on today's exam  - Continue antibiotics per primary  - Peridex mouth rinses BID  - F/u anaerobic cultures/sensitivities  - Recommend meticulous oral care with oral rinses after meals  - Should follow up with dentist after discharge  - Can follow up with ENT PRN    Infectious Diseases  #Left premaxillary odontogenic  abscess  --Recommend continuing PO Augmentin 875mg  q12h for 14 total days of therapy.  --Recommend outpatient f/u with OMFS.     Wound Care Service: Cleanse gluteal cleft and surrounding skin with peri wash, pat dry.  Apply zinc oixde base paste (z-guard) BID and PRN.  Leave open to air.  Turn and reposition q2 hours and PRN.  Keep clean and dry at all times.  Keep HOB <30 degrees if not contraindicated.    Reason for Admission to the Hospital / History of Present Illness: Per H&P, "Emily Ball is a 67 year old female with a hx of CVA with left hemiplegia, hypothyroidism 2/2 thyroidectomy c/b stoma, DM, GERD, presenting with 1 week of worsening L sided facial swelling and pain. Reports the pain began all of a sudden ~1 week ago, but denies any recent trauma, cuts, or caries. Says the pain and swelling have been gradually worsening and spreading up to her L nose, cheek, and eye. Tender to the touch. Denies any vision changes. Has had subjective fevers but no chills. Able to tolerate liquid po but difficult to eat solids. Reports L sided neck pain, headaches, lightheaded/dizziness but no LAD. Has not noticed any discharge or bleeding from her mouth. Denies any new discharge from her stoma. Has been coughing up some clear sputum, but denies SOB or CP, odynophagia, abdominal pain, hematochezia/melena, rashes, n/v.  Said she had been given clindamycin outpatient but has not finished her abx course. Reports she has allergy to penicillin - caused swelling of her lips and eyes. Per chart review she had  been given cefazolin in the past and did not have an allergic reaction. Reports L sided hemiparesis at baseline. Calls her left arm 'Wilma' and left leg 'Fred.' Does have intact sensation bilaterally. Reports swelling and mild neuropathic pain in both legs at baseline."    Hospital Course by Problem:  #Periodontal abscess: Patient presented with worsening lip swelling after failing outpatient clindamycin and underwent  incision and drainage shortly after arrival. As she has tolerated cefazolin in the past per Care Everywhere records, she was started on ceftriaxone and metronidazole. Wound cultures ultimately grew Streptococcus anginosus, Eikenella, and Prevotella. CT head from OSH showed evidence of bony erosion, but per ID, a prolonged antibiotic course is not needed as patients tend to recover well from periodontal infections with few complications. After allergy testing with penicillin and amoxicillin, patient's reported penicillin allergy was removed, and she was discharged on Augmentin with plans to complete 14 total days of therapy per ID recommendations. She was hemodynamically stable with minimal lip swelling on the day of discharge.    Tests Outstanding at Discharge Requiring Follow-Up: None    Discharge Condition: Improved    Key Physical Exam Findings at Discharge:  Mental Status Exam: Patient is alert and oriented to person, place, time, and situation.  Physical examination is significant for:  minimal upper lip swelling with no fluctuance.    Discharge Diet: Diabetic / low-carbohydrate    Discharge Medications:     What To Do With Your Medications      START taking these medications      Add'l Info   amoxicillin-clavulanate 875-125 MG tablet  Commonly known as:  AUGMENTIN  Take 1 tablet by mouth 2 times daily for 9 days.   Quantity:  17 tablet  Refills:  0     ascorbic acid 500 MG tablet  Commonly known as:  VITAMIN C  Take 1 tablet (500 mg) by mouth daily.   Quantity:  30 tablet  Refills:  0     chlorhexidine 0.12 % solution  Commonly known as:  PERIDEX  Swish and spit 15 mL by Mouth/Throat route 2 times daily for 2 days.   Quantity:  473 mL  Refills:  0     multivitamin with minerals Tabs tablet  Take 1 tablet by mouth daily.   Quantity:  30 tablet  Refills:  0        CONTINUE taking these medications      Add'l Info   amLODIPINE 2.5 MG tablet  Commonly known as:  NORVASC  Take 1 tablet (2.5 mg) by mouth daily.    Quantity:  30 tablet  Refills:  3     aspirin 81 MG chewable tablet  Take 81 mg by mouth daily.   Refills:  0     [employer] CITRATE OF MAGNESIA 1.745 GM/30ML Soln  Take 1 bottle by mouth daily as needed for Constipation.  Generic drug:  magnesium citrate   Refills:  0     fluticasone propionate 50 MCG/ACT nasal spray  Commonly known as:  FLONASE  Spray 2 sprays into each nostril daily.   Refills:  0     gabapentin 300 MG capsule  Commonly known as:  NEURONTIN  Take 1 capsule (300 mg) by mouth 3 times daily.   Quantity:  90 capsule  Refills:  3     HYDROcodone-acetaminophen 5-325 MG tablet  Commonly known as:  NORCO  Take 1 tablet by mouth every 6 hours as needed for Moderate Pain (Pain  Score 4-6).   Quantity:  60 tablet  Refills:  0     hydrocortisone 1 % ointment  Apply 1 Application topically 4 times daily.   Refills:  0     lansoprazole 30 MG capsule  Commonly known as:  PREVACID  Take 1 capsule (30 mg) by mouth 2 times daily.   Quantity:  30 capsule  Refills:  3     levothyroxine 125 MCG tablet  Commonly known as:  SYNTHROID  Take 1 tablet (125 mcg) by mouth every morning (before breakfast). NEEDS LABS   Quantity:  90 tablet  Refills:  0     LORazepam 0.5 MG tablet  Commonly known as:  ATIVAN  Take 1 tablet (0.5 mg) by mouth 2 times daily as needed for Anxiety.   Quantity:  60 tablet  Refills:  0     meclizine 25 MG tablet  Commonly known as:  ANTIVERT  Take 1 tablet (25 mg) by mouth every 8 hours as needed for Dizziness.   Quantity:  30 tablet  Refills:  0     naloxone 4 mg/0.1 mL nasal spray  Commonly known as:  NARCAN  For suspected opioid overdose, spray once in each nostril. Repeat after 3 minutes if no or minimal response.   Quantity:  2 bottle  Refills:  3     nitroGLYcerin 0.4 MG SL tablet  Commonly known as:  NITROSTAT  0.5 mg by Sublingual route every 5 minutes as needed.   Refills:  0     nystatin 100000 UNIT/GM powder  Apply 1 Application topically 2 times daily.   Quantity:  60 g  Refills:  3     opioid  agreement agreement  opioid agreement   Quantity:  1 each  Refills:  0     polyethylene glycol powder  Commonly known as:  GLYCOLAX  Take 17 g by mouth daily.   Quantity:  255 g  Refills:  0     senna 8.6 MG tablet  Commonly known as:  SENOKOT  Take 1 tablet (8.6 mg) by mouth daily.   Quantity:  30 tablet  Refills:  3     sertraline 25 MG tablet  Commonly known as:  ZOLOFT  Take 1 tablet (25 mg) by mouth daily.   Quantity:  90 tablet  Refills:  3     SYRINGE 3CC/23GX1" 23G X 1" 3 ML Misc  Use as directed to administer naloxone   Quantity:  50 each  Refills:  0     vitamin B-12 1000 MCG tablet  Commonly known as:  CYANOCOBALAMIN  Take 1 tablet (1,000 mcg) by mouth daily.   Quantity:  30 tablet  Refills:  3        STOP taking these medications    omeprazole 20 MG capsule  Commonly known as:  PRILOSEC           Where to Get Your Medications      These medications were sent to [facility]  [address], [place], [place] [address]    Hours:  Mon-Fri: 8:30am-7:00pm; Sat-Sun & Holidays: 9:00am-5:00pm Phone:  [phone]    amoxicillin-clavulanate 875-125 MG tablet   ascorbic acid 500 MG tablet   chlorhexidine 0.12 % solution   multivitamin with minerals Tabs tablet       Allergies   Allergen Reactions   . Strawberry Rash     Fresh strawberries per patient.    . Sulfa  Drugs Rash   . Aspirin-Dipyridamole Unspecified     pruritus   . Nitrous Oxide Other     Gets paranoid from, given at dentist before   . Trazodone Other     Nightmares   . Zolpidem Unspecified     Doesn't help sleep and gets anxious     Discharge Disposition: Home    Discharge Code Status: Do Not Attempt Resuscitation / full care  This code status is not changed from the time of admission.    Follow Up Appointments:    Scheduled appointments: No future appointments.    For appointments requested for after discharge that have not yet been scheduled, refer to the Post Discharge Referrals section of the After Visit  Summary.    Discharging 35 Contact Information: [facility] operator at [phone]

## 2018-06-18 NOTE — Discharge Instructions (Signed)
1. Continue taking augmentin until 9/14 (last day of antibiotics)  2. Follow up with ENT in clinic   3. There is a referral for outpatient physical therapy if you would like to follow up with PT in clinic  4. Follow up with your PCP    Diagnosis and Reason for Admission    You were admitted to the hospital for the following reason(s):  Peridontal abscess    Your full diagnosis list is located on this After Visit Summary in the Hospital Problems section.    What [facility] Stay    The main tests and treatments done for you during this hospitalization were:    IV antibiotics  Allergy/skin testing    The following evaluation is still important to complete after discharge from the hospital:  - follow up with physical therapy and ENT    Instructions for After Discharge    Your diet at home should be a regular diet.    Your activity level at home should be:  as much exercise or activity as you can tolerate.    Specific activity restrictions:    None    Wound or tube care instructions:  Keep area clean and dry.    Your medication list is located on this After Visit Summary in the Current Discharge Medication List section.  Your nurse will review this information with you before you leave the hospital.    It is very important for you to keep a current medication list with you in order to assist your doctors with your medical care.  Bring this After Visit Summary with you to your follow up appointments.         Reasons to Contact a Doctor Urgently    Call 911 or return to the hospital immediately if:  Call 911 or return to the hospital immediately if:        You have trouble breathing.   You have a fever (temperature higher than 100.35F / 38C) that won't go away.   You cough up blood.   You have chest pain.   You vomit and cannot keep medications down or you feel dizzy, weak or confused.   You lose consciousness.   You have severe pain that won't go away        You should contact either your  primary care physician or your hospital physician for any of the following reasons:       You have wheezing without any trouble with breathing   You feel worse or don't improve in 2 to 3 days.   You are unable to perform your normal activities.   You have nausea and vomiting that does not have any blood.   You feel weak and dizzy.        If you have any questions about your hospital care, your medications, or if you have new or concerning symptoms soon after going home from the hospital, and you need to contact your hospital physician, your hospital physician can be contacted in the following manner:  [facility] operator at [phone].    Once you are able to see your primary care physician (PCP), your PCP will then be responsible for further medication refills, or appointment referrals.    What Needs to Happen Next After Discharge -- Appointments and Follow Up    Any appointments already scheduled at [facility] clinics will be listed in the Future Appointments section at the top of this After Visit Summary.  Any appointments that have been requested, but have not yet been scheduled, will be listed below that under Post Discharge Referrals.    Sometimes tests performed in the hospital do not yet have results by the time a patient goes home.  The following key tests will need to be followed up at your next appointment: None    Medical Home Information    Your primary care provider or clinic currently on file at [place] is: Rickey Primus, [place] currently have an advance directive or living will on file at [place]: Yes    Handouts Given to You (if applicable)

## 2018-06-18 NOTE — Interdisciplinary (Addendum)
06/18/18 1032   Follow Up/Progress   Is the Patient Ready for Discharge Yes   Patient/Family/Legal/Surrogate Decision Maker Has Been Given Options And Choice In The Selection of Post-Acute Care Providers Yes       Per MD, patient stable for discharge today.   Met with patient and offered home health services since she is refusing SNF and is deemed bed-bound.  Patient was initially agreeable as she thought that [facility] would provide assistance with ADLs and personal hygiene needs. But she then changed her mind when she was informed that the [facility] RN would mainly perform an assessment. She stated that she didn't see the point of it and that she has been managing this way for 10 years. She stated that she pays privately for a caregiver at her home.    RN to co-ordinate with social work for ambulance transport home.    14:00 addendum    Patient requested to speak with CM. CM went to speak with patient and patient informed CM that she changed her mind about HH and she would like CM to submit referral to all contracted [place] agencies for RN evaluation. Referral initiated.

## 2018-06-18 NOTE — Plan of Care (Signed)
Problem: Promotion of Health and Safety  Goal: Promotion of Health and Safety  Description  The patient remains safe, receives appropriate treatment and achieves optimal outcomes (physically, psychosocially, and spiritually) within the limitations of the disease process by discharge.    Information below is the current care plan.  Outcome: Progressing  Flowsheets  Taken 06/17/2018 2032  Patient /Family stated Goal: "I can't wait to go home. I passed my allergy test"  Taken 06/18/2018 0259  Guidelines: Inpatient Nursing Guidelines  Individualized Interventions/Recommendations #1: Provide patient with tissue boxes as needed for productive cough.  Individualized Interventions/Recommendations #2 (if applicable): Reinforce importance of repositioning q2hrs.  Individualized Interventions/Recommendations #3 (if applicable): Provide patient with personal belongings bag for possible discharge home in the morning.  Outcome Evaluation (rationale for progressing/not progressing) every shift: Progressing. Pt continues to refuse repositioning. Apply liberal zinc ointment when providing pericare. Pt resting comfortably.

## 2018-06-18 NOTE — Interdisciplinary (Signed)
06/18/18 1533   Assessment   Assessment Type Progress/Follow-up   Referral Information   Referral Type Other (Comment)  (APS report )       SW completed APS report as rec by previous day SW. Patient does not have 24hr support at home and need follow-up/resources/referral assistance by APS due to self-neglect (team rec placement but patient declined).     SW completed referral with Mate with APS and notified APS of patient's dc home today/need for follow up and faxed APS report paperwork to [phone] as instructed/provided by [facility]

## 2018-06-18 NOTE — Plan of Care (Signed)
Problem: Promotion of Health and Safety  Goal: Promotion of Health and Safety  Description  The patient remains safe, receives appropriate treatment and achieves optimal outcomes (physically, psychosocially, and spiritually) within the limitations of the disease process by discharge.    Information below is the current care plan.  06/18/2018 1235 by Antony Odea, RN  Outcome: Discharged  Flowsheets  Taken 06/18/2018 0730  Patient /Family stated Goal: get out of here  Taken 06/18/2018 1235  Guidelines: Inpatient Nursing Guidelines  06/18/2018 1043 by Antony Odea, RN  Outcome: Progressing  Flowsheets  Taken 06/18/2018 0730 by Antony Odea, RN  Patient /Family stated Goal: get out of here  Taken 06/18/2018 1043 by Antony Odea, RN  Guidelines: Inpatient Nursing Guidelines  Outcome Evaluation (rationale for progressing/not progressing) every shift: discharge orders in for pt, all belongings put together, spoke with SW and states pt to be picked up by ambulance at 1400, will f/u  Taken 06/18/2018 0259 by Alda Berthold, RN  Individualized Interventions/Recommendations #1: Provide patient with tissue boxes as needed for productive cough.  Individualized Interventions/Recommendations #2 (if applicable): Reinforce importance of repositioning q2hrs.  Individualized Interventions/Recommendations #3 (if applicable): Provide patient with personal belongings bag for possible discharge home in the morning.

## 2018-06-18 NOTE — Interdisciplinary (Signed)
Discharge instructions reviewed with patient. All questions were addressed. No PIV at time of discharge. Pt refused pneumonia vaccine. Med to bed for discharge medications. Pt to be picked up by ambulance and taken home where caregiver, Clarene Critchley will be home to receive patient. Pt leaving with all belongings.

## 2018-06-18 NOTE — Interdisciplinary (Signed)
Patient discharged home, left the unit via gurney at 1420 with no signs of distress, pt. is very appreciative to all staff for the service.

## 2018-06-21 ENCOUNTER — Other Ambulatory Visit: Payer: Self-pay

## 2018-06-22 ENCOUNTER — Other Ambulatory Visit: Payer: Self-pay

## 2018-06-23 ENCOUNTER — Other Ambulatory Visit: Payer: Self-pay | Admitting: Case Management

## 2018-06-23 ENCOUNTER — Other Ambulatory Visit: Payer: Self-pay

## 2018-06-23 MED ORDER — VITAMIN D 1000 UNIT OR TABS: 1000.00 [IU] | ORAL_TABLET | Freq: Every day | ORAL | Status: AC

## 2018-06-24 ENCOUNTER — Telehealth (INDEPENDENT_AMBULATORY_CARE_PROVIDER_SITE_OTHER): Payer: Self-pay | Admitting: Internal Medicine

## 2018-06-24 ENCOUNTER — Other Ambulatory Visit: Payer: Self-pay

## 2018-06-24 NOTE — Telephone Encounter (Signed)
Hi Dr. Louretta Parma,    Patient is requesting refills for Meclizine. Please send prescription to [facility].    Thank you.    Respecfully,    Anasofia Micallef, RN, BSN, CCM  PHT Case Manager

## 2018-06-28 NOTE — Telephone Encounter (Signed)
Medication requested: Meclizine (antivert) 25 mg  Amount refilled: 30 tablet with 0 refill/s  Date last filled: 04/20/2018  Pertinent Lab/s: resent labs 06/18/2018- CBC, RBC, Basic metabolic panel    Last office visit in clinic::10/27/2017  Return To Clinic: N/A  Next office visit in clinic:Visit date not found    Pended and routed to PCP.       Rerouting to refill clinic to refill med.   Rerouting to front desk to schedule an f/u appt.

## 2018-06-28 NOTE — Telephone Encounter (Signed)
Placed call to pt.  No option for Voice Mail.     Call Center: if pt happens to call please assist pt with scheduling. Thank you    Reason for appt: 6 mo follow up (due in July with Dr Louretta Parma)

## 2018-07-02 ENCOUNTER — Other Ambulatory Visit (INDEPENDENT_AMBULATORY_CARE_PROVIDER_SITE_OTHER): Payer: Self-pay

## 2018-07-02 NOTE — Telephone Encounter (Signed)
Call to pt to follow up. No answer and no vm at this time.  Will attempt to call again in the next week.

## 2018-07-05 ENCOUNTER — Other Ambulatory Visit: Payer: Self-pay

## 2018-07-08 NOTE — Telephone Encounter (Signed)
Internal Medicine Clinic Social Work Outreach    Reason for [employer]:  Scheduled follow up  Assessment/Intervention  Collaborated with PCP regarding [place] plan to auth additional home visit for oversight of medical care.    Spoke with pt who reports that "no one is coming through for her", attempted to clarify if she was referring to assistance with hiring someone, pt states that it costs too much money to hire someone and "everybody is making promises but nobody is doing anything". Explored if pt still has Baker Janus living with her or not, pt indicated she was not going to answer that information.   Attempted to validate pts frustrations however, pt indicated this swer is appreciating that she does not have the ability to pay and was not offering any help.  Pt hung up the call.      Call to APS-made a report for self neglect due to concern for pt not having care in the home outside of help from [place]-unable to confirm if Clarene Critchley is still providing personal care for pt and that pt is bedbound.  Report taken by Jonelle Sidle, will fax [place] [facility] to [phone]   Plan for next Outreach:  Collaborate with team on next steps, assess safety and needs  Next Tentative Outreach:  One to two weeks  Time Spent in outreach:  45 min  PCP Involvement with Coordination of Care:  [place]arg[place]-collaborated today regarding pending auths for an additional HV from contracted visiting physicians

## 2018-07-09 ENCOUNTER — Ambulatory Visit: Payer: Medicare Other | Admitting: Cardiovascular Disease

## 2018-07-12 ENCOUNTER — Other Ambulatory Visit: Payer: Self-pay | Admitting: Case Management

## 2018-07-13 ENCOUNTER — Encounter (HOSPITAL_BASED_OUTPATIENT_CLINIC_OR_DEPARTMENT_OTHER): Payer: Self-pay

## 2018-07-16 ENCOUNTER — Encounter: Payer: Self-pay | Admitting: Cardiovascular Disease

## 2018-07-16 ENCOUNTER — Ambulatory Visit: Payer: Medicare Other | Admitting: Cardiovascular Disease

## 2018-07-16 VITALS — BP 127/82 | HR 93 | Ht 62.5 in | Wt 135.0 lb

## 2018-07-16 DIAGNOSIS — I208 Other forms of angina pectoris: Secondary | ICD-10-CM | POA: Insufficient documentation

## 2018-07-16 DIAGNOSIS — M7989 Other specified soft tissue disorders: Secondary | ICD-10-CM

## 2018-07-16 DIAGNOSIS — I10 Essential (primary) hypertension: Secondary | ICD-10-CM

## 2018-07-16 DIAGNOSIS — Z79899 Other long term (current) drug therapy: Secondary | ICD-10-CM

## 2018-07-16 DIAGNOSIS — E785 Hyperlipidemia, unspecified: Secondary | ICD-10-CM

## 2018-07-16 DIAGNOSIS — I2089 Other forms of angina pectoris: Secondary | ICD-10-CM | POA: Insufficient documentation

## 2018-07-16 HISTORY — DX: Other forms of angina pectoris: I20.8

## 2018-07-16 HISTORY — DX: Other forms of angina pectoris: I20.89

## 2018-07-16 MED ORDER — METOPROLOL TARTRATE 100 MG PO TABS: ORAL_TABLET | ORAL | 0 refills | Status: DC

## 2018-07-16 NOTE — Progress Notes (Signed)
Cardiology Office Note   Date:  07/16/2018   ID:  Morgan Bell, DOB July 07, 1951, MRN [id number]  PCP:  Jearld Fenton, NP  Cardiologist:   Skeet Latch, MD   Chief Complaint  Patient presents with  . Follow-up    6 months  . Edema    Left ankle swell at times.     History of Present Illness: Morgan Bell is a 67 y.o. female with hypertension, hyperlipiemia, RA, prior stroke (age 75, no residual deficits) and obesity who presents for follow up.  She was previously a patient of Dr. Debara Pickett.  She last saw Dr. Debara Pickett 10/2016 and reported mild exertional dyspnea that was overall stable. She also noted mild lower extremity edema that was attributed to amlodipine.  She was noted to have severely elevated triglycerides and was started on fenofibrate with an improvement from 1118 to 384.  Her blood pressure was poorly-controlled.  Enalapril was switched to irbesartan.  She was also started on rosuvastatin given her hypertriglyceridemia and Rheumatoid arthritis.  She had repeat labs 01/2017 that showed her triglycerides had increased from 384 to 586.  She reports that she takes all her medications as prescribed.    At her last appointment [name]'s shortness of breath was improving with exercise.  She wanted to work on diet and exercise before starting cholesterol medication.  Her lipids were checked and remained elevated so rosuvastatin was increased.  She has been walking 1 mile 3 times per week.  It takes her approximately 25 minutes to do so.  She notes that her legs sometimes feels sore after she walks.  She wonders if this could be due to her rheumatoid arthritis or if there is a problem with arteries in her legs.  Sometimes afterwards she feels very exhausted and has to rest for the rest of the day.  However, she typically does go walking in the evenings at the end of the day and after work.  She sometimes has exertional chest pressure that improves with stopping.  This is associated  with shortness of breath but no nausea or diaphoresis.  She recalls having a stress test with Dr. Rex Kras when she was much younger and in better shape.  At that time it was exhausting for her and she is afraid of repeating her stress test.  She is also afraid of pharmacologic stress testing.  For the last 2 months she has had intermittent swelling of the left lower extremity to above the ankle.  It sometimes gets better but never goes completely away.  She has no orthopnea or PND.  She denies any long car trips.  She has been stressed lately because her mother, who has dementia passed away.  Her funeral is tomorrow.  Past Medical History:  Diagnosis Date  . Arthritis   . Exertional angina ([place]) 07/16/2018  . Headache(784.0)    HX MIGRAINES   . Hypertension   . Melanoma ([place])   . Obesity   . Stroke [facility])    MINI STROKE     21 YRS AGO     Past Surgical History:  Procedure Laterality Date  . CESAREAN SECTION  11/26/1973  . CESAREAN SECTION  09/22/1975  . CESAREAN SECTION  04/30/1978  . HEMORRHOID SURGERY  06/1987  . MELANOMA EXCISION WITH SENTINEL LYMPH NODE BIOPSY Right 03/02/2013   Procedure: Wide MELANOMA EXCISION Right Abdominal wall WITH SENTINEL LYMPH NODE mapping Right Axilla;  Surgeon: Merrie Roof, MD;  Location: [facility]  OR;  Service: General;  Laterality: Right;  . NM MYOCAR PERF WALL MOTION  2003   negative Bruce protocol exercise stress test with no evidence of perfusion abnormality, EF 66%  . TOTAL HIP ARTHROPLASTY Right 03/2011     Current Outpatient Medications  Medication Sig Dispense Refill  . amLODipine (NORVASC) 10 MG tablet TAKE 1/2 TABLET BY MOUTH TWICE DAILY 30 tablet 10  . amoxicillin (AMOXIL) 500 MG tablet Take 500 mg 2 (two) times daily by mouth. Dental work    . aspirin EC 81 MG tablet Take 81 mg daily by mouth.    . Cholecalciferol (VITAMIN D) 2000 units CAPS Take 1 capsule daily by mouth.    . fenofibrate (TRICOR) 145 MG tablet TAKE ONE TABLET BY MOUTH ONE TIME  DAILY  30 tablet 4  . Ferrous Sulfate (IRON SLOW RELEASE) 143 (45 Fe) MG TBCR Take 1 tablet daily by mouth.    . irbesartan (AVAPRO) 300 MG tablet TAKE 1 TABLET (300 MG TOTAL) BY MOUTH DAILY. 30 tablet 8  . leflunomide (ARAVA) 20 MG tablet Take 20 mg daily by mouth.    . loratadine (CLARITIN) 10 MG tablet Take 10 mg by mouth daily.    . meloxicam (MOBIC) 15 MG tablet Take 15 mg by mouth daily.    . Misc Natural Products (TURMERIC CURCUMIN) CAPS Take 1 capsule by mouth daily.    . Multiple Vitamin (MULTIVITAMIN WITH MINERALS) TABS Take 1 tablet by mouth daily.    . OMEGA-3 KRILL OIL PO Take 1 capsule by mouth daily.    . potassium chloride SA (K-DUR,KLOR-CON) 20 MEQ tablet Take 1 tablet (20 mEq total) by mouth 2 (two) times daily. 60 tablet 4  . metoprolol tartrate (LOPRESSOR) 100 MG tablet TAKE 1 TABLET BY MOUTH 2 HOURS PRIOR TO CT 1 tablet 0  . rosuvastatin (CRESTOR) 40 MG tablet Take 1 tablet (40 mg total) by mouth daily. 30 tablet 6   No current facility-administered medications for this visit.     Allergies:   Sulfa antibiotics    Social History:  The patient  reports that she has never smoked. She has never used smokeless tobacco. She reports that she does not drink alcohol or use drugs.   Family History:  The patient's family history includes Alzheimer's disease in her maternal grandmother and mother; Cancer in her maternal grandfather; Heart attack in her paternal grandfather; Heart disease in her father; Tuberculosis in her sister.    ROS:  Please see the history of present illness.   Otherwise, review of systems are positive for none.   All other systems are reviewed and negative.    PHYSICAL EXAM: VS:  BP 127/82   Pulse 93   Ht 5' 2.5" (1.588 m)   Wt 135 lb (61.2 kg)   BMI 24.30 kg/m  , BMI Body mass index is 24.3 kg/m. GENERAL:  Well appearing HEENT: Pupils equal round and reactive, fundi not visualized, oral mucosa unremarkable NECK:  No jugular venous distention,  waveform within normal limits, carotid upstroke brisk and symmetric, no bruits LUNGS:  Clear to auscultation bilaterally HEART:  RRR.  PMI not displaced or sustained,S1 and S2 within normal limits, no S3, no S4, no clicks, no rubs, no murmurs ABD:  Flat, positive bowel sounds normal in frequency in pitch, no bruits, no rebound, no guarding, no midline pulsatile mass, no hepatomegaly, no splenomegaly EXT:  2 plus pulses throughout, 1+ pitting edema to above the L ankle, no cyanosis no clubbing  SKIN:  No rashes no nodules NEURO:  Cranial nerves II through XII grossly intact, motor grossly intact throughout PSYCH:  Cognitively intact, oriented to person place and time   EKG:  EKG is ordered today. The ekg ordered 08/17/17 for demonstrates sinus rhythm.  Rate 99 bpm.  Poor R wave progression.  LVH with secondary repolarization of normalities.  LAFB 07/16/2018: Sinus rhythm.  Rate 93 bpm.  PVCs.  LAFB.  Nonspecific ST/T changes.   Recent Labs: 08/26/2017: Hemoglobin 13.6; Platelets 211.0 04/23/2018: ALT 26; BUN 24; Creatinine, Ser 1.09; Potassium 3.6; Sodium 142    Lipid Panel    Component Value Date/Time   CHOL 120 04/23/2018 0829   TRIG 311 (H) 04/23/2018 0829   HDL 17 (L) 04/23/2018 0829   CHOLHDL 9.8 (H) 01/12/2018 0849   CHOLHDL 11 08/26/2017 1559   VLDL 76.8 (H) 10/07/2016 0759   LDLCALC 41 04/23/2018 0829   LDLDIRECT 78.0 08/26/2017 1559   01/16/17:  Total cholesterol 228, triglycerides 586, HDL 27, LDL not calculated.    Wt Readings from Last 3 Encounters:  07/16/18 135 lb (61.2 kg)  01/12/18 158 lb (71.7 kg)  08/26/17 167 lb (75.8 kg)      ASSESSMENT AND PLAN:  # Hypertriglyceridemia:  # Mixed hyperlipidemia:  Ms. Lekas is really working on diet and exercise.  Rosuvastatin was increased in July.  We will repeat lipids and a CMP.  Continue rosuvastatin and krill oil.  # Shortness of breath: # Fatigue:  # Exertional angina:  Improving somewhat but she continues to  have shortness of breath and exertional chest pain.  She is afraid of getting stress testing.  We will get a coronary CT-a to better evaluate her coronaries.  # Hypertension: Blood pressure better controlled.  Continue amlodipine and irbesartan.    # LLE edema: Check LE Dopplers to r/o DVT.  Current medicines are reviewed at length with the patient today.  The patient does not have concerns regarding medicines.  The following changes have been made:  no change  Labs/ tests ordered today include:  Orders Placed This Encounter  Procedures  . CT CORONARY MORPH W/CTA COR W/SCORE W/CA W/CM &/OR WO/CM  . CT CORONARY FRACTIONAL FLOW RESERVE DATA PREP  . CT CORONARY FRACTIONAL FLOW RESERVE FLUID ANALYSIS  . Lipid panel  . Comprehensive metabolic panel  . EKG 12-Lead     Disposition:   FU with Tiffany C. Oval Linsey, MD, [facility] in 6 months.       Signed, Tiffany C. Oval Linsey, MD, [facility]  07/16/2018 9:00 AM    [place]

## 2018-07-16 NOTE — Patient Instructions (Addendum)
Medication Instructions:  TAKE METOPROLOL 100 MG 2 HOURS PRIOR TO CT   If you need a refill on your cardiac medications before your next appointment, please call your pharmacy.   Lab work: FASTING LP/CMET 1 WEEK PRIOR TO CT  If you have labs (blood work) drawn today and your tests are completely normal, you will receive your results only by: Marland Kitchen MyChart Message (if you have MyChart) OR . A paper copy in the mail If you have any lab test that is abnormal or we need to change your treatment, we will call you to review the results.  Testing/Procedures: Your physician has requested that you have cardiac CT. Cardiac computed tomography (CT) is a painless test that uses an x-ray machine to take clear, detailed pictures of your heart. For further information please visit [facility]uge[employer]iest[facility]tn. Please follow instruction sheet as given. THE OFFICE WILL CONTACT YOU AFTER TO SCHEDULE YOUR INSURANCE APPROVES   Your physician has requested that you have a lower or upper extremity venous duplex. This test is an ultrasound of the veins in the legs or arms. It looks at venous blood flow that carries blood from the heart to the legs or arms. Allow one hour for a Lower Venous exam. Allow thirty minutes for an Upper Venous exam. There are no restrictions or special instructions. LEFT LEG   Follow-Up: At [facility], you and your health needs are our priority.  As part of our continuing mission to provide you with exceptional heart care, we have created designated Provider Care Teams.  These Care Teams include your primary Cardiologist (physician) and Advanced Practice Providers (APPs -  Physician Assistants and Nurse Practitioners) who all work together to provide you with the care you need, when you need it. You will need a follow up appointment in 6 months.  Please call our office 2 months in advance to schedule this appointment.  You may see No primary care provider on file. [facility] or one of the following  Advanced Practice Providers on your designated Care Team:   Kerin Ransom, PA-C [place], [place] . Sande Rives, PA-C  Any Other Special Instructions Will Be Listed Below (If Applicable).  Please arrive at the [facility] main entrance of [facility] at xx:xx AM (30-45 minutes prior to test start time)  [facility] [place], [place] [address] [phone]  Proceed to the [facility] Radiology Department (First Floor).  Please follow these instructions carefully (unless otherwise directed):   On the Night Before the Test: . Be sure to Drink plenty of water. . Do not consume any caffeinated/decaffeinated beverages or chocolate 12 hours prior to your test. . Do not take any antihistamines 12 hours prior to your test. . If you take Metformin do not take 24 hours prior to test. . If the patient has contrast allergy: ? Patient will need a prescription for Prednisone and very clear instructions (as follows): 1. Prednisone 50 mg - take 13 hours prior to test 2. Take another Prednisone 50 mg 7 hours prior to test 3. Take another Prednisone 50 mg 1 hour prior to test 4. Take Benadryl 50 mg 1 hour prior to test . Patient must complete all four doses of above prophylactic medications. . Patient will need a ride after test due to Benadryl.  On the Day of the Test: . Drink plenty of water. Do not drink any water within one hour of the test. . Do not eat any food 4  hours prior to the test. . You may take your regular medications prior to the test.  . Take metoprolol (Lopressor) two hours prior to test. . HOLD Furosemide/Hydrochlorothiazide morning of the test.             After the Test: . Drink plenty of water. . After receiving IV contrast, you may experience a mild flushed feeling. This is normal. . On occasion, you may experience a mild rash up to 24 hours after the test. This is not dangerous. If this occurs, you can take Benadryl 25 mg and  increase your fluid intake. . If you experience trouble breathing, this can be serious. If it is severe call 911 IMMEDIATELY. If it is mild, please call our office. . If you take any of these medications: Glipizide/Metformin, Avandament, Glucavance, please do not take 48 hours after completing test.   Cardiac CT Angiogram A cardiac CT angiogram is a procedure to look at the heart and the area around the heart. It may be done to help find the cause of chest pains or other symptoms of heart disease. During this procedure, a large X-ray machine, called a CT scanner, takes detailed pictures of the heart and the surrounding area after a dye (contrast material) has been injected into blood vessels in the area. The procedure is also sometimes called a coronary CT angiogram, coronary artery scanning, or CTA. A cardiac CT angiogram allows the health care provider to see how well blood is flowing to and from the heart. The health care provider will be able to see if there are any problems, such as:  Blockage or narrowing of the coronary arteries in the heart.  Fluid around the heart.  Signs of weakness or disease in the muscles, valves, and tissues of the heart.  Tell a health care provider about:  Any allergies you have. This is especially important if you have had a previous allergic reaction to contrast dye.  All medicines you are taking, including vitamins, herbs, eye drops, creams, and over-the-counter medicines.  Any blood disorders you have.  Any surgeries you have had.  Any medical conditions you have.  Whether you are pregnant or may be pregnant.  Any anxiety disorders, chronic pain, or other conditions you have that may increase your stress or prevent you from lying still. What are the risks? Generally, this is a safe procedure. However, problems may occur, including:  Bleeding.  Infection.  Allergic reactions to medicines or dyes.  Damage to other structures or  organs.  Kidney damage from the dye or contrast that is used.  Increased risk of cancer from radiation exposure. This risk is low. Talk with your health care provider about: ? The risks and benefits of testing. ? How you can receive the lowest dose of radiation.  What happens before the procedure?  Wear comfortable clothing and remove any jewelry, glasses, dentures, and hearing aids.  Follow instructions from your health care provider about eating and drinking. This may include: ? For 12 hours before the test - avoid caffeine. This includes tea, coffee, soda, energy drinks, and diet pills. Drink plenty of water or other fluids that do not have caffeine in them. Being well-hydrated can prevent complications. ? For 4-6 hours before the test - stop eating and drinking. The contrast dye can cause nausea, but this is less likely if your stomach is empty.  Ask your health care provider about changing or stopping your regular medicines. This is especially important if you are  taking diabetes medicines, blood thinners, or medicines to treat erectile dysfunction. What happens during the procedure?  Hair on your chest may need to be removed so that small sticky patches called electrodes can be placed on your chest. These will transmit information that helps to monitor your heart during the test.  An IV tube will be inserted into one of your veins.  You might be given a medicine to control your heart rate during the test. This will help to ensure that good images are obtained.  You will be asked to lie on an exam table. This table will slide in and out of the CT machine during the procedure.  Contrast dye will be injected into the IV tube. You might feel warm, or you may get a metallic taste in your mouth.  You will be given a medicine (nitroglycerin) to relax (dilate) the arteries in your heart.  The table that you are lying on will move into the CT machine tunnel for the scan.  The person  running the machine will give you instructions while the scans are being done. You may be asked to: ? Keep your arms above your head. ? Hold your breath. ? Stay very still, even if the table is moving.  When the scanning is complete, you will be moved out of the machine.  The IV tube will be removed. The procedure may vary among health care providers and hospitals. What happens after the procedure?  You might feel warm, or you may get a metallic taste in your mouth from the contrast dye.  You may have a headache from the nitroglycerin.  After the procedure, drink water or other fluids to wash (flush) the contrast material out of your body.  Contact a health care provider if you have any symptoms of allergy to the contrast. These symptoms include: ? Shortness of breath. ? Rash or hives. ? A racing heartbeat.  Most people can return to their normal activities right after the procedure. Ask your health care provider what activities are safe for you.  It is up to you to get the results of your procedure. Ask your health care provider, or the department that is doing the procedure, when your results will be ready. Summary  A cardiac CT angiogram is a procedure to look at the heart and the area around the heart. It may be done to help find the cause of chest pains or other symptoms of heart disease.  During this procedure, a large X-ray machine, called a CT scanner, takes detailed pictures of the heart and the surrounding area after a dye (contrast material) has been injected into blood vessels in the area.  Ask your health care provider about changing or stopping your regular medicines before the procedure. This is especially important if you are taking diabetes medicines, blood thinners, or medicines to treat erectile dysfunction.  After the procedure, drink water or other fluids to wash (flush) the contrast material out of your body. This information is not intended to replace advice  given to you by your health care provider. Make sure you discuss any questions you have with your health care provider. Document Released: 09/11/2008 Document Revised: 08/18/2016 Document Reviewed: 08/18/2016 Elsevier Interactive Patient Education  2017 [employer].

## 2018-07-21 ENCOUNTER — Other Ambulatory Visit: Payer: Self-pay

## 2018-07-22 ENCOUNTER — Other Ambulatory Visit (INDEPENDENT_AMBULATORY_CARE_PROVIDER_SITE_OTHER): Payer: Self-pay

## 2018-07-22 NOTE — Telephone Encounter (Signed)
Internal Medicine Clinic Social Work Outreach    Reason for [employer]:  Scheduled outreach  Assessment/Intervention  Spoke with pt who reports no new issues other than occasional difficulty sleeping or wakefulness overnight.  Explored if pt has been contacted by the mobile provider, pt denies that she has.  Reviewed notes indicating auth process and plan for Mobile provider to contact pt for a visit.  Pt expressed ongoing frustration of being bed bound since her stroke, reviewed life events and efforts to be a hard worker and good provider to her family, married 2x and yet not pt expresses feeling like she is not able to get any help or support from family due to them having their own obligations.  Allowed pt to vent feelings and provided validation.  Pt confirmed that she is "getting by"when asked about meals and needs.  Pt terminated call indicating that her care attendant had just arrived for her personal care.  Encouraged pt to discuss difficulty with sleep with the visiting mobile provider.  Plan for next Outreach:  Will continue to offer support and explore options for care  Next Tentative Outreach:  2 weeks  Time Spent in outreach:  15 min  PCP Involvement with Coordination of Care:  Louretta Parma

## 2018-07-23 ENCOUNTER — Ambulatory Visit (HOSPITAL_COMMUNITY)
Admission: RE | Admit: 2018-07-23 | Discharge: 2018-07-23 | Disposition: A | Discharge status: Home / Self Care | Payer: Medicare Other | Source: Ambulatory Visit | Attending: Cardiovascular Disease | Admitting: Cardiovascular Disease

## 2018-07-23 DIAGNOSIS — M7989 Other specified soft tissue disorders: Secondary | ICD-10-CM

## 2018-07-23 NOTE — Telephone Encounter (Signed)
Internal Medicine Clinic Social Work Outreach    Reason for [employer]:  Follow up on caregiving resource  Assessment/Intervention  Collaborated with Mendel Ryder regarding option of [employer] which has a grant available for pts in the community (usually with a cog issue but can help if they are in a care giving crisis).  Pt may be eligible for a grant administered by [facility] but funded by [employer] which would potentially allow the pt to receive 720 hours worth of care annually while paying 13.50/hour (the grant would supplement the remainder cost of care).      Pt advised that this grant is not connected to [place] and if pt qualifies she will be able utilize this grant should she choose to utilize other health care systems or providers in the future-this is not tied to her [employer].    Pt requested information  Sent to her email and hard copy sent via mail.  Pt advised that there is no wait period, she needs to call to apply.    Pt indicates she has not yet heard from the mobile provider for a home visit at this time.  Plan for next Outreach:  Assess usefulness of resources  Next Tentative Outreach:  2 weeks  Time Spent in outreach:  10 min  PCP Involvement with Coordination of Care:  Louretta Parma

## 2018-07-26 ENCOUNTER — Other Ambulatory Visit: Payer: Self-pay

## 2018-07-29 ENCOUNTER — Other Ambulatory Visit: Payer: Self-pay | Admitting: Case Management

## 2018-08-04 DIAGNOSIS — M5136 Other intervertebral disc degeneration, lumbar region: Secondary | ICD-10-CM | POA: Diagnosis not present

## 2018-08-04 DIAGNOSIS — M0589 Other rheumatoid arthritis with rheumatoid factor of multiple sites: Secondary | ICD-10-CM | POA: Diagnosis not present

## 2018-08-04 DIAGNOSIS — M150 Primary generalized (osteo)arthritis: Secondary | ICD-10-CM | POA: Diagnosis not present

## 2018-08-04 DIAGNOSIS — Z79899 Other long term (current) drug therapy: Secondary | ICD-10-CM | POA: Diagnosis not present

## 2018-08-05 ENCOUNTER — Other Ambulatory Visit (INDEPENDENT_AMBULATORY_CARE_PROVIDER_SITE_OTHER): Payer: Self-pay | Admitting: Internal Medicine

## 2018-08-05 DIAGNOSIS — E039 Hypothyroidism, unspecified: Principal | ICD-10-CM

## 2018-08-05 NOTE — Telephone Encounter (Signed)
Hi Dr. Louretta Parma,    Pt's last TSH elevated at 19.45 on 07/28/17. Pt was seen 10/17/17 and advised to get TSH rechecked but did not. She's been on levo 125 mcg since then.     Lab Results   Component Value Date    TSH 19.45 (H) 07/28/2017     I've sent a scheduling request for her to be seen by you. Would you like to continue therapy and retest TSH at next OV?     I've pended levothyroxine 125 mcg once daily 30 ds + 0 rf for you to approve/refuse.    Thanks,    Levonne Lapping, PharmD, BCACP, [facility]   Refill and Prior [place]  Phone:  [phone]  Ext:  [phone]

## 2018-08-05 NOTE — Telephone Encounter (Signed)
Scheduling Request and Lab Reminder:     Please remind pt to schedule f/u appt w Rickey Primus, [facility], for 6 mo f/u, was due on or after 04/26/18. Please also advise pt to get her TSH checked.     Authorized 30 days supply + 0 RF until f/u scheduled.  Note this is 2nd reminder for pt, will need appt for further refills thru RF clinic.        Thanks,    [facility]   Refill and Prior [employer]  Phone:  [phone]  Ext:  [phone]

## 2018-08-05 NOTE — Telephone Encounter (Signed)
Melisa Sharen Counter, CPhT  (Rx Refill and PA Clinic)          Hypothyroid Medication Refill Protocol    Last visit: 10/27/17 w/ PCP est care    Last pertinent visit: 10/27/17  Next f/u appt due:  (around 04/26/2018).   Next scheduled appointment: none       Per OV 10/27/17:  A/P:  Hypothyroidism, unspecified type: last TSH in Oct, 2018 was 19. Will recheck labs today.  -     TSH, Blood - See Instructions; Future  -     Free Thyroxine, Blood Green Plasma Separator Tube; Future  Return in about 6 months (around 04/26/2018).       Per ED/Hospital note 06/13/18:  Chronic Issues   #Hypothyroidism  - cont home levothyroxine 125      Per Tele 06/24/18:        Per Media 07/08/18:                DISPENSE HX:          CHECKED CARE EVERYWHERE & MEDIA FOR OUTSIDE LABS NO RESULTS FOUND  LABS required:  (Q year TSH)  *If recent dose change: check TSH 2 months after starting new  dose.    Lab Results   Component Value Date    TSH 19.45 (H) 07/28/2017         Monitoring required:  (Q year BP, HR, Wt)   *If pt is pregnant, DEFER TO MD*    (BP range: MVEHMCNO=70-962  EZMOQHUTM=54-65)  Blood Pressure   06/18/18 132/59   11/20/17 (!) 149/94   10/27/17 153/84       (HR range: 55-110)   Pulse Readings from Last 3 Encounters:   06/18/18 72   11/20/17 73   10/27/17 64       Wt Readings from Last 3 Encounters:   06/13/18 124 kg (273 lb 5.9 oz)   11/20/17 145.2 kg (320 lb)   07/24/17 133.7 kg (294 lb 12.1 oz)

## 2018-08-05 NOTE — Telephone Encounter (Signed)
This is a patient followed by Amy who has significant challenges in seeing [place] in clinic. I unfortunately have only seen her once.    I have cc'd Amy here to see what can be done for [place] to recheck her TSH before I refill this medication. It is likely we need to increase the dose but without new data I would be doing this blindly. I have resent the TSH/Free T4 in case patient can get this done soon.    Dr Louretta Parma

## 2018-08-06 ENCOUNTER — Other Ambulatory Visit: Payer: Self-pay | Admitting: Cardiovascular Disease

## 2018-08-06 ENCOUNTER — Other Ambulatory Visit: Payer: Self-pay

## 2018-08-06 NOTE — Telephone Encounter (Signed)
[place] collaboration with Tammy Totten CN and [facility] Director regarding order placed for TSH and need for lab draw prior to refill.    Pt was recently seen by [place] and [facility] was ordered for OT/PT again.  Tammy to follow up with pt and [facility] provider for lab follow up.
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
## 2018-08-06 NOTE — Telephone Encounter (Signed)
Pt getting labs with in home care, once results received a new refill request will be sent to pcp. Closing encounter

## 2018-08-12 ENCOUNTER — Other Ambulatory Visit (INDEPENDENT_AMBULATORY_CARE_PROVIDER_SITE_OTHER): Payer: Self-pay

## 2018-08-12 NOTE — Telephone Encounter (Signed)
Call placed to pt to determine if she would be open to [facility] or RN with [facility] to draw labs so she can have synthroid medication refilled.  Pt declined at this time and states that she is interested in switching insurances and is interested in [place] or straight [employer].  Pt declines any further follow up at this time.  No further issues or concerns other than having Synthroid medication filled.

## 2018-08-17 ENCOUNTER — Other Ambulatory Visit (INDEPENDENT_AMBULATORY_CARE_PROVIDER_SITE_OTHER): Payer: Self-pay | Admitting: Internal Medicine

## 2018-08-17 DIAGNOSIS — F419 Anxiety disorder, unspecified: Secondary | ICD-10-CM

## 2018-08-17 DIAGNOSIS — R42 Dizziness and giddiness: Secondary | ICD-10-CM

## 2018-08-17 NOTE — Telephone Encounter (Signed)
Prescription Refill Request     Incoming call from patient requesting refill   Name of PCP Provider: Rickey Primus, Marisue Ivan PCP  Last office visit: 10/27/17  Next office visit: N/A    Allergies   Allergen Reactions   . Strawberry Rash     Fresh strawberries per patient.    . Sulfa Drugs Rash   . Aspirin-Dipyridamole Unspecified     pruritus   . Nitrous Oxide Other     Gets paranoid from, given at dentist before   . Trazodone Other     Nightmares   . Zolpidem Unspecified     Doesn't help sleep and gets anxious       Requested Medication/s:   meclizine 25 MG tablet  sertraline 25 MG tablet  -Patient is out of medication.     Patient has made an attempt to refill with pharmacy? NO    Send to:     [place], [address]  [address] [place] [address]  Phone: [phone] Fax: [phone]    Has been advised this message will be transmitted to office and if any issues can expect a response within the next 24-72 hours, otherwise pharmacy should be contacting patient when refill is processed.

## 2018-08-18 ENCOUNTER — Other Ambulatory Visit (INDEPENDENT_AMBULATORY_CARE_PROVIDER_SITE_OTHER): Payer: Self-pay

## 2018-08-18 MED ORDER — SERTRALINE HCL 25 MG OR TABS
25.0000 mg | ORAL_TABLET | Freq: Every day | ORAL | 0 refills | Status: AC
Start: 2018-08-18 — End: ?

## 2018-08-18 MED ORDER — MECLIZINE HCL 25 MG OR TABS
25.0000 mg | ORAL_TABLET | Freq: Three times a day (TID) | ORAL | 2 refills | Status: AC | PRN
Start: 2018-08-18 — End: ?

## 2018-08-18 NOTE — Telephone Encounter (Signed)
Patient wants at different pharmacy   Per pt note 08/17/18    Janine Limbo, CPhT  (Rx Refill and PA Clinic)      OTC Refill Protocol Meclizine     Last visit: 10/27/17 est care     Last pertinent visit: 10/27/17  Next f/u appt due:  6 mos 04/26/18   Next scheduled appointment: none    Per refill 04/22/18            Serotonin Reuptake Inhibitor Refill Protocol (or Trazodone or Buspar)    Per ov 10/27/17  Anxiety  Takes lorazepam 1-2 times daily. Has been on this dose for years without side effects. Usually anxious 2/2 immobility. Lorazapem helps her symptoms.    Per refill 08/05/18        LABS required:  (Q yr SCr (citalopram, escitalopram, and paxil only))    Lab Results   Component Value Date    NA 139 06/18/2018    K 3.7 06/18/2018    CL 101 06/18/2018    BICARB 28 06/18/2018    BUN 13 06/18/2018    CREAT 0.51 06/18/2018       Lab Results   Component Value Date    GFRNON >60 06/18/2018         Monitoring required:  (Q year BP, HR)   *Trazodone - irregular HR in pts w CVD and/or risk factors assoc w  QTc prolongation*    (BP range: UKRCVKFM=40-375  OHKGOVPCH=40-35)  Blood Pressure   06/18/18 132/59   11/20/17 (!) 149/94   10/27/17 153/84       (HR range: 55-110)  Pulse Readings from Last 3 Encounters:   06/18/18 72   11/20/17 73   10/27/17 64

## 2018-08-18 NOTE — Telephone Encounter (Signed)
Call to home call dentists Nicholes Rough and Stefano Gaul who both serve pts community.    Call to [facility] which does offer Geriatric Specialists in the [place] for home visits.    Call to pt, no answer or ability to leave a msg    Email to pt with the details of dentists who make house calls and costs in addition to availability for geriatric Specialist and willingness to make a referral.    Will continue to follow up.

## 2018-08-20 NOTE — Telephone Encounter (Signed)
Late note addendum - during call on 08/12/18 pt was offered multiple options to have lab work completed for refill of Synthroid including home health, mobile lab through [facility] and transportation to go into the lab.  Patient declined all of these options and states she just wants to switch insurances at this time.

## 2018-08-27 ENCOUNTER — Encounter: Payer: Medicare Other | Admitting: Internal Medicine

## 2018-09-03 NOTE — Telephone Encounter (Signed)
Population Health Social Work Outreach Follow Up    Reason for Follow Up  Scheduled follow up with pt     Assessment/Interventions  Spoke with pt who confirmed receipt of information on Dentists and geriatric specialist options.  Pt states she has not outreached on any of the dental or care giving resources as pt reports no extra income to hire anyone.  Discussed geriatric specialist as a possible resource to offer if there are any additional unexplored resources that pt may beneift from.  Pt gave permission to make referral.    Call to [facility], left msg for [facility] requesting a return call for a geriatric specialist    Plan for Next Outreach  Complete referral     Tentatively Schedule Next Outreach Date  1-2 business days

## 2018-09-06 ENCOUNTER — Telehealth: Payer: Self-pay | Admitting: *Deleted

## 2018-09-06 ENCOUNTER — Encounter: Payer: Self-pay | Admitting: Internal Medicine

## 2018-09-06 ENCOUNTER — Ambulatory Visit (INDEPENDENT_AMBULATORY_CARE_PROVIDER_SITE_OTHER): Payer: Medicare Other | Admitting: Internal Medicine

## 2018-09-06 VITALS — BP 128/84 | HR 78 | Temp 97.9°F | Ht 60.5 in | Wt 134.0 lb

## 2018-09-06 DIAGNOSIS — G459 Transient cerebral ischemic attack, unspecified: Secondary | ICD-10-CM

## 2018-09-06 DIAGNOSIS — M069 Rheumatoid arthritis, unspecified: Secondary | ICD-10-CM

## 2018-09-06 DIAGNOSIS — E782 Mixed hyperlipidemia: Secondary | ICD-10-CM

## 2018-09-06 DIAGNOSIS — Z23 Encounter for immunization: Secondary | ICD-10-CM | POA: Diagnosis not present

## 2018-09-06 DIAGNOSIS — C4359 Malignant melanoma of other part of trunk: Secondary | ICD-10-CM

## 2018-09-06 DIAGNOSIS — I10 Essential (primary) hypertension: Secondary | ICD-10-CM

## 2018-09-06 DIAGNOSIS — E559 Vitamin D deficiency, unspecified: Secondary | ICD-10-CM | POA: Diagnosis not present

## 2018-09-06 DIAGNOSIS — Z0000 Encounter for general adult medical examination without abnormal findings: Secondary | ICD-10-CM

## 2018-09-06 LAB — LIPID PANEL
Cholesterol: 104 mg/dL (ref 0–200)
HDL: 22.9 mg/dL — ABNORMAL LOW (ref >39.00)
NonHDL: 81.48
Total CHOL/HDL Ratio: 5
Triglycerides: 202 mg/dL — ABNORMAL HIGH (ref 0.0–149.0)
VLDL: 40.4 mg/dL — ABNORMAL HIGH (ref 0.0–40.0)

## 2018-09-06 LAB — CBC
HCT: 37.3 % (ref 36.0–46.0)
Hemoglobin: 12.3 g/dL (ref 12.0–15.0)
MCHC: 32.9 g/dL (ref 30.0–36.0)
MCV: 87.1 fl (ref 78.0–100.0)
Platelets: 155 10*3/uL (ref 150.0–400.0)
RBC: 4.28 Mil/uL (ref 3.87–5.11)
RDW: 15.8 % — ABNORMAL HIGH (ref 11.5–15.5)
WBC: 5.9 10*3/uL (ref 4.0–10.5)

## 2018-09-06 LAB — COMPREHENSIVE METABOLIC PANEL
ALT: 27 U/L (ref 0–35)
AST: 52 U/L — ABNORMAL HIGH (ref 0–37)
Albumin: 4 g/dL (ref 3.5–5.2)
Alkaline Phosphatase: 66 U/L (ref 39–117)
BUN: 29 mg/dL — ABNORMAL HIGH (ref 6–23)
CO2: 27 mEq/L (ref 19–32)
Calcium: 9.8 mg/dL (ref 8.4–10.5)
Chloride: 104 mEq/L (ref 96–112)
Creatinine, Ser: 1.09 mg/dL (ref 0.40–1.20)
GFR: 52.77 mL/min — ABNORMAL LOW (ref >60.00)
Glucose, Bld: 87 mg/dL (ref 70–99)
Potassium: 3.3 mEq/L — ABNORMAL LOW (ref 3.5–5.1)
Sodium: 139 mEq/L (ref 135–145)
Total Bilirubin: 0.7 mg/dL (ref 0.2–1.2)
Total Protein: 6.7 g/dL (ref 6.0–8.3)

## 2018-09-06 LAB — LDL CHOLESTEROL, DIRECT: Direct LDL: 57 mg/dL

## 2018-09-06 LAB — VITAMIN D 25 HYDROXY (VIT D DEFICIENCY, FRACTURES): VITD: 120.1 ng/mL (ref 30.00–100.00)

## 2018-09-06 NOTE — Assessment & Plan Note (Signed)
CMET and Lipid profile today Continue Rosuvastatin and Fenofibrate for now Reinforced low saturated fat diet

## 2018-09-06 NOTE — Telephone Encounter (Signed)
Received call from [place] with Critical Vit D level of 120.10 ng/ml.

## 2018-09-06 NOTE — Assessment & Plan Note (Signed)
Controlled on Meloxicam and Arava She will continue to follow with rheumatology

## 2018-09-06 NOTE — Telephone Encounter (Signed)
Call pt:  Vit d level elevated. Needs to stop Vit D OTC. Repeat Vit D in 1 month, lab only.

## 2018-09-06 NOTE — Assessment & Plan Note (Signed)
No residual effect Continue Metoprolol, Amlodipine, Irbesartan, Fenofibrate, Rosuvastatin and ASA

## 2018-09-06 NOTE — Assessment & Plan Note (Signed)
Controlled on Amlodipine and Irbesartan Amlodipine likely contributing to LLE- she will d/w her cardiologist Reinforced DASH diet CBC and CMET today

## 2018-09-06 NOTE — Progress Notes (Signed)
HPI:  Pt presents to the clinic today for her Medicare Wellness Exam. She is also due to follow up chronic conditions.  Rheumatoid Arthritis: She is taking Meloxicam and Arava as prescribed. She feels like her pain is well controlled. She follows with Dr. Amil Amen.  HTN: Her BP today is 128/84. She is taking Metoprolol, Amlodipine and Irbesartan as prescribed. She foloows with Dr. Oval Linsey. ECG from 08/2017 reviewed.  Hx of Melanoma: Trunk, sp excision in 2014. She follows with Dr. Allyson Sabal yearly.  Hx of TIA: Without residual effect. Her last LDL was 41, 04/2018.She is taking Metoprolol, Rosuvastatin, Fenofibrate and ASA as prescribed.  Past Medical History:  Diagnosis Date  . Arthritis   . Exertional angina ([place]) 07/16/2018  . Headache(784.0)    HX MIGRAINES   . Hypertension   . Melanoma ([place])   . Obesity   . Stroke [facility])    MINI STROKE     21 YRS AGO     Current Outpatient Medications  Medication Sig Dispense Refill  . amLODipine (NORVASC) 10 MG tablet TAKE 1/2 TABLET BY MOUTH TWICE DAILY 30 tablet 10  . amoxicillin (AMOXIL) 500 MG tablet Take 500 mg 2 (two) times daily by mouth. Dental work    . aspirin EC 81 MG tablet Take 81 mg daily by mouth.    . Cholecalciferol (VITAMIN D) 2000 units CAPS Take 1 capsule daily by mouth.    . fenofibrate (TRICOR) 145 MG tablet TAKE 1 TABLET BY MOUTH DAILY. 30 tablet 6  . Ferrous Sulfate (IRON SLOW RELEASE) 143 (45 Fe) MG TBCR Take 1 tablet daily by mouth.    . irbesartan (AVAPRO) 300 MG tablet TAKE ONE TABLET BY MOUTH ONE TIME DAILY  30 tablet 7  . leflunomide (ARAVA) 20 MG tablet Take 20 mg daily by mouth.    . loratadine (CLARITIN) 10 MG tablet Take 10 mg by mouth daily.    . meloxicam (MOBIC) 15 MG tablet Take 15 mg by mouth daily.    . metoprolol tartrate (LOPRESSOR) 100 MG tablet TAKE 1 TABLET BY MOUTH 2 HOURS PRIOR TO CT 1 tablet 0  . Misc Natural Products (TURMERIC CURCUMIN) CAPS Take 1 capsule by mouth daily.    . Multiple Vitamin  (MULTIVITAMIN WITH MINERALS) TABS Take 1 tablet by mouth daily.    . OMEGA-3 KRILL OIL PO Take 1 capsule by mouth daily.    . potassium chloride SA (K-DUR,KLOR-CON) 20 MEQ tablet Take 1 tablet (20 mEq total) by mouth 2 (two) times daily. 60 tablet 4  . rosuvastatin (CRESTOR) 40 MG tablet Take 1 tablet (40 mg total) by mouth daily. 30 tablet 6   No current facility-administered medications for this visit.     Allergies  Allergen Reactions  . Sulfa Antibiotics     SULFA DRUGS    Family History  Problem Relation Age of Onset  . Alzheimer's disease Mother   . Heart disease Father        MI, died at 34  . Alzheimer's disease Maternal Grandmother   . Cancer Maternal Grandfather        lung  . Heart attack Paternal Grandfather   . Tuberculosis Sister     Social History   Socioeconomic History  . Marital status: Divorced    Spouse name: Not on file  . Number of children: 3  . Years of education: 85  . Highest education level: Not on file  Occupational History    Employer: [employer]  Comment: Network engineer; raises service dogs.   Social Needs  . Financial resource strain: Not on file  . Food insecurity:    Worry: Not on file    Inability: Not on file  . Transportation needs:    Medical: Not on file    Non-medical: Not on file  Tobacco Use  . Smoking status: Never Smoker  . Smokeless tobacco: Never Used  Substance and Sexual Activity  . Alcohol use: No  . Drug use: No  . Sexual activity: Never  Lifestyle  . Physical activity:    Days per week: Not on file    Minutes per session: Not on file  . Stress: Not on file  Relationships  . Social connections:    Talks on phone: Not on file    Gets together: Not on file    Attends religious service: Not on file    Active member of club or organization: Not on file    Attends meetings of clubs or organizations: Not on file    Relationship status: Not on file  . Intimate partner violence:    Fear of current or  ex partner: Not on file    Emotionally abused: Not on file    Physically abused: Not on file    Forced sexual activity: Not on file  Other Topics Concern  . Not on file  Social History Narrative  . Not on file    Hospitiliaztions: None  Health Maintenance:    Flu: 08/2017  Tetanus: > 10 years ago  Pneumovax: 08/2017  Prevnar: 07/2016  Zostavax: never  Shingrix: never  Mammogram: > 5 years ago  Pap Smear: > 5 years ago  Bone Density: unsure  Colon Screening: > 10 years ago  Eye Doctor: every 1-2 year  Dental Exam: annually   Providers:   PCP: Webb Silversmith, NP-C  Dermatologist: Dr. Allyson Sabal  Cardiologist: Dr. Robynn Pane  Rheumatology: Dr. Amil Amen   I have personally reviewed and have noted:  1. The patient's medical and social history 2. Their use of alcohol, tobacco or illicit drugs 3. Their current medications and supplements 4. The patient's functional ability including ADL's, fall risks, home safety risks and hearing or visual impairment. 5. Diet and physical activities 6. Evidence for depression or mood disorder  Subjective:   Review of Systems:   Constitutional: Denies fever, malaise, fatigue, headache or abrupt weight changes.  HEENT: Denies eye pain, eye redness, ear pain, ringing in the ears, wax buildup, runny nose, nasal congestion, bloody nose, or sore throat. Respiratory: Denies difficulty breathing, shortness of breath, cough or sputum production.   Cardiovascular: Pt reports left leg swelling. Denies chest pain, chest tightness, palpitations or swelling in the hands.  Gastrointestinal: Pt reports intermittent reflux. Denies abdominal pain, bloating, constipation, diarrhea or blood in the stool.  GU: Denies urgency, frequency, pain with urination, burning sensation, blood in urine, odor or discharge. Musculoskeletal: Pt reports joint pain. Denies decrease in range of motion, difficulty with gait, muscle pain or joint swelling.  Skin: Denies redness,  rashes, lesions or ulcercations.  Neurological: Denies dizziness, difficulty with memory, difficulty with speech or problems with balance and coordination.  Psych: Denies anxiety, depression, SI/HI.  No other specific complaints in a complete review of systems (except as listed in HPI above).  Objective:  PE:   BP 128/84   Pulse 78   Temp 97.9 F (36.6 C) (Oral)   Ht 5' 0.5" (1.537 m)   Wt 134 lb (60.8 kg)   SpO2  98%   BMI 25.74 kg/m   Wt Readings from Last 3 Encounters:  09/06/18 134 lb (60.8 kg)  07/16/18 135 lb (61.2 kg)  01/12/18 158 lb (71.7 kg)    General: Appears her stated age, well developed, well nourished in NAD. Skin: Warm, dry and intact.  HEENT: Head: normal shape and size; Eyes: sclera white, no icterus, conjunctiva pink, PERRLA and EOMs intact; Ears: Tm's gray and intact, normal light reflex; Throat/Mouth: Teeth present, mucosa pink and moist, no exudate, lesions or ulcerations noted.  Neck: Neck supple, trachea midline. No masses, lumps or thyromegaly present.  Cardiovascular: Normal rate and rhythm. S1,S2 noted.  No murmur, rubs or gallops noted. 1+ LLE edema. No carotid bruits noted. Pulmonary/Chest: Normal effort and positive vesicular breath sounds. No respiratory distress. No wheezes, rales or ronchi noted.  Abdomen: Soft and nontender. Normal bowel sounds. No distention or masses noted. Liver, spleen and kidneys non palpable. Musculoskeletal: Kyphotic. Strength 5/5 BUE/BLE. No signs of joint swelling.  Neurological: Alert and oriented. Cranial nerves II-XII grossly intact. Coordination normal.  Psychiatric: Mood and affect normal. Behavior is normal. Judgment and thought content normal.     BMET    Component Value Date/Time   NA 142 04/23/2018 0829   K 3.6 04/23/2018 0829   CL 103 04/23/2018 0829   CO2 23 04/23/2018 0829   GLUCOSE 80 04/23/2018 0829   GLUCOSE 98 08/26/2017 1559   BUN 24 04/23/2018 0829   CREATININE 1.09 (H) 04/23/2018 0829    CREATININE 1.45 (H) 11/10/2016 1418   CALCIUM 10.0 04/23/2018 0829   GFRNONAA 52 (L) 04/23/2018 0829   GFRAA 60 04/23/2018 0829    Lipid Panel     Component Value Date/Time   CHOL 120 04/23/2018 0829   TRIG 311 (H) 04/23/2018 0829   HDL 17 (L) 04/23/2018 0829   CHOLHDL 9.8 (H) 01/12/2018 0849   CHOLHDL 11 08/26/2017 1559   VLDL 76.8 (H) 10/07/2016 0759   LDLCALC 41 04/23/2018 0829    CBC    Component Value Date/Time   WBC 6.1 08/26/2017 1559   RBC 4.81 08/26/2017 1559   HGB 13.6 08/26/2017 1559   HCT 42.0 08/26/2017 1559   PLT 211.0 08/26/2017 1559   MCV 87.3 08/26/2017 1559   MCV 93.1 01/16/2014 2105   MCH 28.8 01/16/2014 2105   MCH 26.2 02/28/2013 1545   MCHC 32.4 08/26/2017 1559   RDW 14.8 08/26/2017 1559   LYMPHSABS 2.3 02/28/2013 1545   MONOABS 1.0 02/28/2013 1545   EOSABS 0.3 02/28/2013 1545   BASOSABS 0.1 02/28/2013 1545    Hgb A1C Lab Results  Component Value Date   HGBA1C 5.7 08/26/2017      Assessment and Plan:   Medicare Annual Wellness Visit:  Diet: She does eat meat. She consumes fruits and veggies daily. She avoids fried foods. She drinks mostly unsweet tea, water, sparkling water. Physical activity: Walking 3 miles, 3 x week Depression/mood screen: Negative Hearing: Intact to whispered voice Visual acuity: Grossly normal, performs annual eye exam  ADLs: Capable Fall risk: None Home safety: Good Cognitive evaluation: Intact to orientation, naming, recall and repetition EOL planning: No adv directives, full code/ I agree  Preventative Medicine: Flu shot today. She declines tetanus for insurance reasons. Pneumovax and prevnar UTD. She will call insurance provider about Shingrix. She declines pap smear, mammogram, colon screening or bone density exam. Encouraged her to consume a balanced diet and exercise regimen. Advised her to see an eye doctor and dentist annually.  Will check CBC, CMET, Lipid and Vit D today.   Next appointment: 1 year  Medicare Wellness Exam   Webb Silversmith, NP

## 2018-09-06 NOTE — Assessment & Plan Note (Signed)
In remission She will continue to follow with dermatology

## 2018-09-06 NOTE — Patient Instructions (Signed)
Health Maintenance for Postmenopausal Women Menopause is a normal process in which your reproductive ability comes to an end. This process happens gradually over a span of months to years, usually between the ages of 22 and 9. Menopause is complete when you have missed 12 consecutive menstrual periods. It is important to talk with your health care provider about some of the most common conditions that affect postmenopausal women, such as heart disease, cancer, and bone loss (osteoporosis). Adopting a healthy lifestyle and getting preventive care can help to promote your health and wellness. Those actions can also lower your chances of developing some of these common conditions. What should I know about menopause? During menopause, you may experience a number of symptoms, such as:  Moderate-to-severe hot flashes.  Night sweats.  Decrease in sex drive.  Mood swings.  Headaches.  Tiredness.  Irritability.  Memory problems.  Insomnia.  Choosing to treat or not to treat menopausal changes is an individual decision that you make with your health care provider. What should I know about hormone replacement therapy and supplements? Hormone therapy products are effective for treating symptoms that are associated with menopause, such as hot flashes and night sweats. Hormone replacement carries certain risks, especially as you become older. If you are thinking about using estrogen or estrogen with progestin treatments, discuss the benefits and risks with your health care provider. What should I know about heart disease and stroke? Heart disease, heart attack, and stroke become more likely as you age. This may be due, in part, to the hormonal changes that your body experiences during menopause. These can affect how your body processes dietary fats, triglycerides, and cholesterol. Heart attack and stroke are both medical emergencies. There are many things that you can do to help prevent heart disease  and stroke:  Have your blood pressure checked at least every 1-2 years. High blood pressure causes heart disease and increases the risk of stroke.  If you are 53-22 years old, ask your health care provider if you should take aspirin to prevent a heart attack or a stroke.  Do not use any tobacco products, including cigarettes, chewing tobacco, or electronic cigarettes. If you need help quitting, ask your health care provider.  It is important to eat a healthy diet and maintain a healthy weight. ? Be sure to include plenty of vegetables, fruits, low-fat dairy products, and lean protein. ? Avoid eating foods that are high in solid fats, added sugars, or salt (sodium).  Get regular exercise. This is one of the most important things that you can do for your health. ? Try to exercise for at least 150 minutes each week. The type of exercise that you do should increase your heart rate and make you sweat. This is known as moderate-intensity exercise. ? Try to do strengthening exercises at least twice each week. Do these in addition to the moderate-intensity exercise.  Know your numbers.Ask your health care provider to check your cholesterol and your blood glucose. Continue to have your blood tested as directed by your health care provider.  What should I know about cancer screening? There are several types of cancer. Take the following steps to reduce your risk and to catch any cancer development as early as possible. Breast Cancer  Practice breast self-awareness. ? This means understanding how your breasts normally appear and feel. ? It also means doing regular breast self-exams. Let your health care provider know about any changes, no matter how small.  If you are 40  or older, have a clinician do a breast exam (clinical breast exam or CBE) every year. Depending on your age, family history, and medical history, it may be recommended that you also have a yearly breast X-ray (mammogram).  If you  have a family history of breast cancer, talk with your health care provider about genetic screening.  If you are at high risk for breast cancer, talk with your health care provider about having an MRI and a mammogram every year.  Breast cancer (BRCA) gene test is recommended for women who have family members with BRCA-related cancers. Results of the assessment will determine the need for genetic counseling and BRCA1 and for BRCA2 testing. BRCA-related cancers include these types: ? Breast. This occurs in males or females. ? Ovarian. ? Tubal. This may also be called fallopian tube cancer. ? Cancer of the abdominal or pelvic lining (peritoneal cancer). ? Prostate. ? Pancreatic.  Cervical, Uterine, and Ovarian Cancer Your health care provider may recommend that you be screened regularly for cancer of the pelvic organs. These include your ovaries, uterus, and vagina. This screening involves a pelvic exam, which includes checking for microscopic changes to the surface of your cervix (Pap test).  For women ages 21-65, health care providers may recommend a pelvic exam and a Pap test every three years. For women ages 79-65, they may recommend the Pap test and pelvic exam, combined with testing for human papilloma virus (HPV), every five years. Some types of HPV increase your risk of cervical cancer. Testing for HPV may also be done on women of any age who have unclear Pap test results.  Other health care providers may not recommend any screening for nonpregnant women who are considered low risk for pelvic cancer and have no symptoms. Ask your health care provider if a screening pelvic exam is right for you.  If you have had past treatment for cervical cancer or a condition that could lead to cancer, you need Pap tests and screening for cancer for at least 20 years after your treatment. If Pap tests have been discontinued for you, your risk factors (such as having a new sexual partner) need to be  reassessed to determine if you should start having screenings again. Some women have medical problems that increase the chance of getting cervical cancer. In these cases, your health care provider may recommend that you have screening and Pap tests more often.  If you have a family history of uterine cancer or ovarian cancer, talk with your health care provider about genetic screening.  If you have vaginal bleeding after reaching menopause, tell your health care provider.  There are currently no reliable tests available to screen for ovarian cancer.  Lung Cancer Lung cancer screening is recommended for adults 69-62 years old who are at high risk for lung cancer because of a history of smoking. A yearly low-dose CT scan of the lungs is recommended if you:  Currently smoke.  Have a history of at least 30 pack-years of smoking and you currently smoke or have quit within the past 15 years. A pack-year is smoking an average of one pack of cigarettes per day for one year.  Yearly screening should:  Continue until it has been 15 years since you quit.  Stop if you develop a health problem that would prevent you from having lung cancer treatment.  Colorectal Cancer  This type of cancer can be detected and can often be prevented.  Routine colorectal cancer screening usually begins at  age 42 and continues through age 45.  If you have risk factors for colon cancer, your health care provider may recommend that you be screened at an earlier age.  If you have a family history of colorectal cancer, talk with your health care provider about genetic screening.  Your health care provider may also recommend using home test kits to check for hidden blood in your stool.  A small camera at the end of a tube can be used to examine your colon directly (sigmoidoscopy or colonoscopy). This is done to check for the earliest forms of colorectal cancer.  Direct examination of the colon should be repeated every  5-10 years until age 71. However, if early forms of precancerous polyps or small growths are found or if you have a family history or genetic risk for colorectal cancer, you may need to be screened more often.  Skin Cancer  Check your skin from head to toe regularly.  Monitor any moles. Be sure to tell your health care provider: ? About any new moles or changes in moles, especially if there is a change in a mole's shape or color. ? If you have a mole that is larger than the size of a pencil eraser.  If any of your family members has a history of skin cancer, especially at a young age, talk with your health care provider about genetic screening.  Always use sunscreen. Apply sunscreen liberally and repeatedly throughout the day.  Whenever you are outside, protect yourself by wearing long sleeves, pants, a wide-brimmed hat, and sunglasses.  What should I know about osteoporosis? Osteoporosis is a condition in which bone destruction happens more quickly than new bone creation. After menopause, you may be at an increased risk for osteoporosis. To help prevent osteoporosis or the bone fractures that can happen because of osteoporosis, the following is recommended:  If you are 46-71 years old, get at least 1,000 mg of calcium and at least 600 mg of vitamin D per day.  If you are older than age 55 but younger than age 65, get at least 1,200 mg of calcium and at least 600 mg of vitamin D per day.  If you are older than age 54, get at least 1,200 mg of calcium and at least 800 mg of vitamin D per day.  Smoking and excessive alcohol intake increase the risk of osteoporosis. Eat foods that are rich in calcium and vitamin D, and do weight-bearing exercises several times each week as directed by your health care provider. What should I know about how menopause affects my mental health? Depression may occur at any age, but it is more common as you become older. Common symptoms of depression  include:  Low or sad mood.  Changes in sleep patterns.  Changes in appetite or eating patterns.  Feeling an overall lack of motivation or enjoyment of activities that you previously enjoyed.  Frequent crying spells.  Talk with your health care provider if you think that you are experiencing depression. What should I know about immunizations? It is important that you get and maintain your immunizations. These include:  Tetanus, diphtheria, and pertussis (Tdap) booster vaccine.  Influenza every year before the flu season begins.  Pneumonia vaccine.  Shingles vaccine.  Your health care provider may also recommend other immunizations. This information is not intended to replace advice given to you by your health care provider. Make sure you discuss any questions you have with your health care provider. Document Released: 11/21/2005  Document Revised: 04/18/2016 Document Reviewed: 07/03/2015 Elsevier Interactive Patient Education  2018 [employer]  

## 2018-09-07 NOTE — Telephone Encounter (Signed)
Return call from Ivalee from [facility].  Cecille Rubin accepted referral and indicates plan to make a home visit today or tomorrow.  Will continue to follow.

## 2018-09-08 ENCOUNTER — Other Ambulatory Visit: Payer: Self-pay | Admitting: Internal Medicine

## 2018-09-08 DIAGNOSIS — E876 Hypokalemia: Secondary | ICD-10-CM

## 2018-09-08 NOTE — Telephone Encounter (Signed)
Population Health Social Work Outreach Follow Up    Reason for Follow Up  Incoming call from [employer] from [facility] reports she completed a home visit with pt, reviewed potential resources Elder help (companionship program and shared housing)-discussed waitlist and potential concern for pt not being eligible due to pt maintaining a bed in the shared living area of the home.  Cecille Rubin indicates that pt will need to call-advised this was previously discussed with pt along with caregiver resources and a variety of health insurance options including [employer] and [place].  Cecille Rubin indicates pt plans to convert her insurance to straight Medicare.  Cecille Rubin reports plan for a second visit to discuss any additional options or questions pt may have.  Cecille Rubin confirmed all current providers are still in place, Baker Janus (pt wants to evict her), Perrin Smack (help on the weekend) and Clarene Critchley for 2xday personal care.    Plan for Next Outreach  Remain available for support   Tentatively Schedule Next Outreach Date  2-3 weeks

## 2018-09-13 ENCOUNTER — Ambulatory Visit (HOSPITAL_COMMUNITY): Payer: Medicare Other

## 2018-09-13 ENCOUNTER — Ambulatory Visit (HOSPITAL_COMMUNITY)
Admission: RE | Admit: 2018-09-13 | Discharge: 2018-09-13 | Disposition: A | Discharge status: Home / Self Care | Payer: Medicare Other | Source: Ambulatory Visit | Attending: Cardiovascular Disease | Admitting: Cardiovascular Disease

## 2018-09-13 ENCOUNTER — Encounter (HOSPITAL_COMMUNITY): Payer: Self-pay

## 2018-09-13 DIAGNOSIS — I208 Other forms of angina pectoris: Secondary | ICD-10-CM | POA: Diagnosis not present

## 2018-09-13 MED ORDER — METOPROLOL TARTRATE 5 MG/5ML IV SOLN
5.0000 mg | INTRAVENOUS | Status: AC | PRN
  Administered 2018-09-13 (×3): 5 mg via INTRAVENOUS

## 2018-09-13 MED ORDER — NITROGLYCERIN 0.4 MG SL SUBL
0.8000 mg | SUBLINGUAL_TABLET | Freq: Once | SUBLINGUAL | Status: AC
  Administered 2018-09-13: 0.8 mg via SUBLINGUAL
  Filled 2018-09-13: qty 25

## 2018-09-13 MED ORDER — METOPROLOL TARTRATE 5 MG/5ML IV SOLN
INTRAVENOUS | Status: AC
  Administered 2018-09-13: 5 mg via INTRAVENOUS
  Filled 2018-09-13: qty 10

## 2018-09-13 MED ORDER — IOPAMIDOL (ISOVUE-370) INJECTION 76%
80.0000 mL | Freq: Once | INTRAVENOUS | Status: AC | PRN
  Administered 2018-09-13: 80 mL via INTRAVENOUS

## 2018-09-13 MED ORDER — DILTIAZEM HCL 25 MG/5ML IV SOLN
INTRAVENOUS | Status: AC
  Administered 2018-09-13: 5 mg via INTRAVENOUS
  Filled 2018-09-13: qty 5

## 2018-09-13 MED ORDER — NITROGLYCERIN 0.4 MG SL SUBL
SUBLINGUAL_TABLET | SUBLINGUAL | Status: AC
  Filled 2018-09-13: qty 2

## 2018-09-13 MED ORDER — DILTIAZEM HCL 25 MG/5ML IV SOLN
5.0000 mg | Freq: Once | INTRAVENOUS | Status: AC
  Administered 2018-09-13: 5 mg via INTRAVENOUS
  Filled 2018-09-13: qty 5

## 2018-09-13 MED ORDER — METOPROLOL TARTRATE 5 MG/5ML IV SOLN
5.0000 mg | INTRAVENOUS | Status: AC | PRN
  Administered 2018-09-13 (×2): 5 mg via INTRAVENOUS
  Filled 2018-09-13 (×2): qty 5

## 2018-09-13 MED ORDER — METOPROLOL TARTRATE 5 MG/5ML IV SOLN
INTRAVENOUS | Status: AC
  Administered 2018-09-13: 5 mg via INTRAVENOUS
  Filled 2018-09-13: qty 15

## 2018-09-28 ENCOUNTER — Telehealth (INDEPENDENT_AMBULATORY_CARE_PROVIDER_SITE_OTHER): Payer: Self-pay | Admitting: Internal Medicine

## 2018-09-28 ENCOUNTER — Other Ambulatory Visit: Payer: Self-pay

## 2018-09-28 NOTE — Telephone Encounter (Signed)
Who is calling: Ancillary: [facility] from [facility] is calling on behalf of patient  Insurance Coverage Verified: Active- in network  Reason for this call: Requesting a letter of agreement for additional visits.Please fax to [phone]    Action required by office: Please contact caller    Duplicate encounter? No previous documentation found on this issue.     Best way to contact: [phone]  Alternative: n/a    Inquiry has been read verbatim to this caller. Verbalizes satisfaction and confirms the above is accurate: yes      Has been advised this message will be transmitted to office and can expect a response within the next 24-72 hours.

## 2018-09-28 NOTE — Telephone Encounter (Signed)
Population Health Social Work Outreach Follow Up    Reason for Follow Up  Scheduled follow up    Assessment/Interventions  Spoke with pt who confirmed visits from [employer] from [facility].  Pt states she may have "run them off" by being in a bad mood.  Pt indicates she thinks she may have an infection and is hoping to get a prescription for antibiotics.  Pt states she anticipates a visit from [employer] [facility] providers this afternoon around 2 pm.  Confirmed note from [employer] requesting additional visits.  Email to Managed care for assistance with processing request.  Advised pt that Josem Kaufmann will be requested. Pt thankful for support.    Plan for Next Outreach  Pt had previously indicated plan to end insurance with [employer].  Will follow up to see continues to utilize [employer] for coverage and follow up if able.    Tentatively Schedule Next Outreach Date  One month

## 2018-09-28 NOTE — Telephone Encounter (Signed)
PHT Care Navigator Outreach:  Incoming call from [facility] calling from [place] requesting authorization for a home care visit for the patient. Sheryl reports that a home care visit was to be done 09/28/2018 but there is no current home visit authorization, and the patient is stating that she is not filling well and needs to be seen. CN went and spoke with Helene Kelp and informed her of the information. Helene Kelp has given verbal approval for a home visit today and said she will get the authorization fax over.    CN received authorization fax over to [employer] address to [facility].

## 2018-09-28 NOTE — Telephone Encounter (Signed)
Dr. Louretta Parma: please assist with letter    Jamse Mead to clarify message below. Requesting a letterhead by the PCP approving the visit made on 08/27/2018 as well as 3-4 additional visits, as the patient is unable to travel to see Dr. Louretta Parma. Please address letterhead to "[facility]".

## 2018-09-28 NOTE — Telephone Encounter (Signed)
Returned call to [place] requesting  more information regarding message below. Per [facility] he spoke to [place] this morning and gave him the information.

## 2018-10-08 ENCOUNTER — Other Ambulatory Visit (INDEPENDENT_AMBULATORY_CARE_PROVIDER_SITE_OTHER): Payer: Medicare Other

## 2018-10-08 DIAGNOSIS — E876 Hypokalemia: Secondary | ICD-10-CM

## 2018-10-08 DIAGNOSIS — R7989 Other specified abnormal findings of blood chemistry: Secondary | ICD-10-CM | POA: Diagnosis not present

## 2018-10-08 LAB — VITAMIN D 25 HYDROXY (VIT D DEFICIENCY, FRACTURES): VITD: 93.12 ng/mL (ref 30.00–100.00)

## 2018-10-08 LAB — POTASSIUM: Potassium: 3.4 mEq/L — ABNORMAL LOW (ref 3.5–5.1)

## 2018-10-08 NOTE — Addendum Note (Signed)
Addended by: Ellamae Sia on: 10/08/2018 08:09 AM   Modules accepted: Orders

## 2018-10-19 ENCOUNTER — Other Ambulatory Visit: Payer: Self-pay | Admitting: Cardiovascular Disease

## 2018-10-20 NOTE — Telephone Encounter (Signed)
Wrote letter requested in system.     Please let me know if further details needed.    Dr Louretta Parma

## 2018-10-20 NOTE — Telephone Encounter (Signed)
Letter faxed to [facility] to fax number [phone]. Fax confirmation received.  Closing encounter.

## 2018-10-22 ENCOUNTER — Other Ambulatory Visit: Payer: Self-pay

## 2018-10-22 NOTE — Telephone Encounter (Signed)
Email from [facility] care navigator confirming pt in no longer with [facility] and eligible for [facility].  Case is closed.

## 2018-10-22 NOTE — Telephone Encounter (Signed)
Chart review, member inquiry shows that pt does not currently have [employer] in place, appears to have ended on 11/12/2017.    Email to [facility] for direction regarding closing case and to encourage review of 09/28/18 call regarding recent letter from PCP authing home visits.    Awaiting direction on next steps.

## 2018-10-25 ENCOUNTER — Other Ambulatory Visit: Payer: Self-pay

## 2018-10-25 NOTE — Telephone Encounter (Signed)
[facility] Care Navigator Outreach:    Late Entry:  Call on 10/22/2018 and spoke with a [employer] Representative to confirm the patient eligibility, CN has been informed the patient eligibility has been terminated as of 10/12/2018. CN will send a message to the [facility] team for the update.    CN will close the case.

## 2018-10-25 NOTE — Telephone Encounter (Signed)
In basket to PCP making him aware of pts change in insurance with [employer] ending on 10/12/2018.  Pt is no longer eligible for [facility] or requires auths for mobile providers from [facility] or Managed Care.  Advised last home visit Emily Ball was completed in Dec of 2019.

## 2018-11-04 ENCOUNTER — Other Ambulatory Visit: Payer: Self-pay | Admitting: Cardiovascular Disease

## 2018-11-23 DIAGNOSIS — Z79899 Other long term (current) drug therapy: Secondary | ICD-10-CM | POA: Diagnosis not present

## 2018-11-23 DIAGNOSIS — M150 Primary generalized (osteo)arthritis: Secondary | ICD-10-CM | POA: Diagnosis not present

## 2018-11-23 DIAGNOSIS — M5136 Other intervertebral disc degeneration, lumbar region: Secondary | ICD-10-CM | POA: Diagnosis not present

## 2018-11-23 DIAGNOSIS — M2550 Pain in unspecified joint: Secondary | ICD-10-CM | POA: Diagnosis not present

## 2018-11-23 DIAGNOSIS — M0589 Other rheumatoid arthritis with rheumatoid factor of multiple sites: Secondary | ICD-10-CM | POA: Diagnosis not present

## 2019-01-13 ENCOUNTER — Telehealth: Payer: Self-pay | Admitting: Internal Medicine

## 2019-01-13 NOTE — Telephone Encounter (Signed)
Pt need refill for Potassium Chloride    [facility]

## 2019-01-14 MED ORDER — POTASSIUM CHLORIDE CRYS ER 20 MEQ PO TBCR: 40.0000 meq | EXTENDED_RELEASE_TABLET | Freq: Every day | ORAL | 1 refills | Status: DC

## 2019-01-14 NOTE — Telephone Encounter (Signed)
Rx sent through e-scribe  

## 2019-02-01 ENCOUNTER — Telehealth: Payer: Self-pay | Admitting: Cardiovascular Disease

## 2019-02-01 NOTE — Telephone Encounter (Signed)
Smartphone/ virtual consent/ my chart/ pre reg completed °

## 2019-02-02 ENCOUNTER — Encounter: Payer: Self-pay | Admitting: Cardiovascular Disease

## 2019-02-02 ENCOUNTER — Telehealth (INDEPENDENT_AMBULATORY_CARE_PROVIDER_SITE_OTHER): Payer: Medicare Other | Admitting: Cardiovascular Disease

## 2019-02-02 ENCOUNTER — Encounter (INDEPENDENT_AMBULATORY_CARE_PROVIDER_SITE_OTHER): Payer: Self-pay

## 2019-02-02 DIAGNOSIS — E785 Hyperlipidemia, unspecified: Secondary | ICD-10-CM

## 2019-02-02 DIAGNOSIS — G459 Transient cerebral ischemic attack, unspecified: Secondary | ICD-10-CM

## 2019-02-02 DIAGNOSIS — I208 Other forms of angina pectoris: Secondary | ICD-10-CM | POA: Diagnosis not present

## 2019-02-02 DIAGNOSIS — I10 Essential (primary) hypertension: Secondary | ICD-10-CM | POA: Diagnosis not present

## 2019-02-02 NOTE — Progress Notes (Signed)
Virtual Visit via Video Note   This visit type was conducted due to national recommendations for restrictions regarding the COVID-19 Pandemic (e.g. social distancing) in an effort to limit this patient's exposure and mitigate transmission in our community.  Due to her co-morbid illnesses, this patient is at least at moderate risk for complications without adequate follow up.  This format is felt to be most appropriate for this patient at this time.  All issues noted in this document were discussed and addressed.  A limited physical exam was performed with this format.  Please refer to the patient's chart for her consent to telehealth for [facility].   A video visit was attempted.  However, after several attempts it was converted to phone due to patient's Internet browser not able to be updated.  Evaluation Performed:  Follow-up visit  Date:  02/02/2019   ID:  Morgan, Bell Sep 19, 1951, MRN [id number]  Patient Location: Home Provider Location: Office  PCP:  Jearld Fenton, NP  Cardiologist:  Skeet Latch, MD  Electrophysiologist:  None   Chief Complaint:  Follow up  History of Present Illness:    Morgan Bell is a 68 y.o. female with mild, non-obstructive CAD, hypertension, hyperlipiemia, RA, prior stroke (age 3, no residual deficits) and obesity who presents for follow up.  She was previously a patient of Dr. Debara Pickett.  She last saw Dr. Debara Pickett 10/2016 and reported mild exertional dyspnea that was overall stable. She also noted mild lower extremity edema that was attributed to amlodipine.  She was noted to have severely elevated triglycerides and was started on fenofibrate with an improvement from 1118 to 384.  Her blood pressure was poorly-controlled.  Enalapril was switched to irbesartan.  She was also started on rosuvastatin given her hypertriglyceridemia and Rheumatoid arthritis.  She had repeat labs 01/2017 that showed her triglycerides had increased from 384 to 586 despite  taking her medication as prescribed.  At her last appointment Ms. Crothers reported feeling exhausted after exercise and had some exertional chest pressure. She recalls having a stress test with Dr. Rex Kras when she was much younger and in better shape.  At that time it was exhausting for her and she is afraid of repeating her stress test.  She is also afraid of pharmacologic stress testing.    She was referred for coronary CT-A that revealed mild (1 to 25%) disease in the LAD and RCA but was otherwise unremarkable.  Coronary calcium score was 68th percentile.  She also continues to complain of lower extremity edema and had lower extremity Dopplers that were negative for DVT.  Since then she saw her PCP and thought that her edema was due to amlodipine.  She continued to take amlodipine but started taking 7 mushrooms and CoQ-10 and no longer has edema.  Her BP has been very well-controlled in the 110s-120s.  She has been walking approximately 1 mile daily.  She has also been very careful with her diet.  She limits starches and stopped eating after 3 PM.  She has lost over 50 pounds in the last couple years.  She is feeling great and has no complaints at this time.  The patient does not have symptoms concerning for COVID-19 infection (fever, chills, cough, or new shortness of breath).    Past Medical History:  Diagnosis Date  . Arthritis   . Exertional angina ([place]) 07/16/2018  . Headache(784.0)    HX MIGRAINES   . Hypertension   . Melanoma ([place])   .  Obesity   . Stroke [facility])    MINI STROKE     21 YRS AGO    Past Surgical History:  Procedure Laterality Date  . CESAREAN SECTION  11/26/1973  . CESAREAN SECTION  09/22/1975  . CESAREAN SECTION  04/30/1978  . HEMORRHOID SURGERY  06/1987  . MELANOMA EXCISION WITH SENTINEL LYMPH NODE BIOPSY Right 03/02/2013   Procedure: Wide MELANOMA EXCISION Right Abdominal wall WITH SENTINEL LYMPH NODE mapping Right Axilla;  Surgeon: Merrie Roof, MD;  Location: [place];  Service: General;  Laterality: Right;  . NM [place]  2003   negative Bruce protocol exercise stress test with no evidence of perfusion abnormality, EF 66%  . TOTAL HIP ARTHROPLASTY Right 03/2011     Current Meds  Medication Sig  . amLODipine (NORVASC) 10 MG tablet TAKE 1/2 TABLET BY MOUTH TWICE DAILY  . amoxicillin (AMOXIL) 500 MG tablet Take 500 mg by mouth as needed. 4 TABS 1 HOUR PRIOR TO DENTAL WORK  . aspirin EC 81 MG tablet Take 81 mg daily by mouth.  . fenofibrate (TRICOR) 145 MG tablet TAKE 1 TABLET BY MOUTH DAILY.  Marland Kitchen Ferrous Sulfate (IRON SLOW RELEASE) 143 (45 Fe) MG TBCR Take 1 tablet daily by mouth.  . irbesartan (AVAPRO) 300 MG tablet TAKE ONE TABLET BY MOUTH ONE TIME DAILY   . leflunomide (ARAVA) 20 MG tablet Take 20 mg daily by mouth.  . loratadine (CLARITIN) 10 MG tablet Take 10 mg by mouth daily.  . meloxicam (MOBIC) 15 MG tablet Take 15 mg by mouth as needed.   . Multiple Vitamin (MULTIVITAMIN WITH MINERALS) TABS Take 1 tablet by mouth daily.  . OMEGA-3 KRILL OIL PO Take 1 capsule by mouth daily.  . potassium chloride SA (K-DUR,KLOR-CON) 20 MEQ tablet Take 2 tablets (40 mEq total) by mouth daily.  . rosuvastatin (CRESTOR) 40 MG tablet TAKE ONE TABLET BY MOUTH ONE TIME DAILY      Allergies:   Sulfa antibiotics   Social History   Tobacco Use  . Smoking status: Never Smoker  . Smokeless tobacco: Never Used  Substance Use Topics  . Alcohol use: No  . Drug use: No     Family Hx: The patient's family history includes Alzheimer's disease in her maternal grandmother and mother; Cancer in her maternal grandfather; Heart attack in her paternal grandfather; Heart disease in her father; Tuberculosis in her sister.  ROS:   Please see the history of present illness.     All other systems reviewed and are negative.   Prior CV studies:   The following studies were reviewed today:  Coronary CT-A 09/2018:  Minimal RCA and LAD disease.   IMPRESSION: 1. Coronary calcium score of 62.1. This was 68th percentile for age and sex matched control. 2. Normal coronary origin with right dominance. 3. No evidence of obstructive CAD.  LE Doppler 07/2018: Negative for DVT  Labs/Other Tests and Data Reviewed:    EKG:  No ECG reviewed.  Recent Labs: 09/06/2018: ALT 27; BUN 29; Creatinine, Ser 1.09; Hemoglobin 12.3; Platelets 155.0; Sodium 139 10/08/2018: Potassium 3.4   Recent Lipid Panel Lab Results  Component Value Date/Time   CHOL 104 09/06/2018 12:42 PM   CHOL 120 04/23/2018 08:29 AM   TRIG 202.0 (H) 09/06/2018 12:42 PM   HDL 22.90 (L) 09/06/2018 12:42 PM   HDL 17 (L) 04/23/2018 08:29 AM   CHOLHDL 5 09/06/2018 12:42 PM   LDLCALC 41 04/23/2018 08:29 AM  LDLDIRECT 57.0 09/06/2018 12:42 PM    Wt Readings from Last 3 Encounters:  02/02/19 125 lb 12.8 oz (57.1 kg)  09/06/18 134 lb (60.8 kg)  07/16/18 135 lb (61.2 kg)     Objective:    BP 122/80   Pulse 86   Ht 5\' 1"  (1.549 m)   Wt 125 lb 12.8 oz (57.1 kg)   BMI 23.77 kg/m  GENERAL: No acute distress.  Respirations unlabored HEENT: Pupils equal round.  Oral mucosa unremarkable NEURO:  Speech fluent.   PSYCH:  Cognitively intact, oriented to person place and time   ASSESSMENT & PLAN:    # Hypertriglyceridemia:  # Mixed hyperlipidemia:  Triglycerides 202.  LDL 57 on 08/2018.  Continue fenofibrate and rosuvastatin.    # Non-obstructive CAD: Minimal disease on CT.  Continue lipid management as above and aspirin.  # Hypertension: Blood pressure better controlled.  Continue amlodipine and irbesartan.    # LLE edema: Resolved.   COVID-19 Education: The signs and symptoms of COVID-19 were discussed with the patient and how to seek care for testing (follow up with PCP or arrange E-visit).  The importance of social distancing was discussed today.  Time:   Today, I have spent 23 minutes with the patient with telehealth technology discussing the above  problems.     Medication Adjustments/Labs and Tests Ordered: Current medicines are reviewed at length with the patient today.  Concerns regarding medicines are outlined above.   Tests Ordered: No orders of the defined types were placed in this encounter.   Medication Changes: No orders of the defined types were placed in this encounter.   Disposition:  Follow up in 1 year(s)  Signed, Skeet Latch, MD  02/02/2019 11:40 AM    [facility]

## 2019-02-02 NOTE — Patient Instructions (Signed)
Medication Instructions:  Your physician recommends that you continue on your current medications as directed. Please refer to the Current Medication list given to you today.  If you need a refill on your cardiac medications before your next appointment, please call your pharmacy.   Lab work: NONE  Testing/Procedures: NONE  Follow-Up: At [facility], you and your health needs are our priority.  As part of our continuing mission to provide you with exceptional heart care, we have created designated Provider Care Teams.  These Care Teams include your primary Cardiologist (physician) and Advanced Practice Providers (APPs -  Physician Assistants and Nurse Practitioners) who all work together to provide you with the care you need, when you need it. You will need a follow up appointment in 12 months.  Please call our office 2 months in advance to schedule this appointment.  You may see Tiffany [place], MD or one of the following Advanced Practice Providers on your designated Care Team:   Luke Kilroy, PA-C Krista Kroeger, PA-C . Callie Goodrich, PA-C    

## 2019-02-17 ENCOUNTER — Encounter (INDEPENDENT_AMBULATORY_CARE_PROVIDER_SITE_OTHER): Payer: Self-pay | Admitting: Internal Medicine

## 2019-03-21 ENCOUNTER — Other Ambulatory Visit: Payer: Self-pay | Admitting: Cardiovascular Disease

## 2019-03-21 ENCOUNTER — Other Ambulatory Visit: Payer: Self-pay | Admitting: Internal Medicine

## 2019-04-06 DIAGNOSIS — M0589 Other rheumatoid arthritis with rheumatoid factor of multiple sites: Secondary | ICD-10-CM | POA: Diagnosis not present

## 2019-04-06 DIAGNOSIS — Z79899 Other long term (current) drug therapy: Secondary | ICD-10-CM | POA: Diagnosis not present

## 2019-04-06 DIAGNOSIS — M5136 Other intervertebral disc degeneration, lumbar region: Secondary | ICD-10-CM | POA: Diagnosis not present

## 2019-04-06 DIAGNOSIS — M150 Primary generalized (osteo)arthritis: Secondary | ICD-10-CM | POA: Diagnosis not present

## 2019-04-22 ENCOUNTER — Other Ambulatory Visit: Payer: Self-pay | Admitting: Cardiovascular Disease

## 2019-04-22 ENCOUNTER — Encounter (INDEPENDENT_AMBULATORY_CARE_PROVIDER_SITE_OTHER): Payer: Self-pay

## 2019-04-29 ENCOUNTER — Telehealth (INDEPENDENT_AMBULATORY_CARE_PROVIDER_SITE_OTHER): Payer: Self-pay | Admitting: Gastroenterology

## 2019-04-29 NOTE — Telephone Encounter (Signed)
Home Health Certification and Plan of care received from [place].     Document dated from 11/11/2017 to 01/09/2018.     Scanned into media for review.

## 2019-04-29 NOTE — Telephone Encounter (Signed)
RN called  [facility] [phone]. Spoke with Sharyn Lull.  RN discussed that this certification for [facility] is related to pt's hemiplegia secondary to prior CVA.    RN deferred Sharyn Lull to have pt's PCP/neurologist (if applicable) sign these orders.  Sharyn Lull v/u and will reach out to pt/pt's family.

## 2019-05-04 ENCOUNTER — Ambulatory Visit (INDEPENDENT_AMBULATORY_CARE_PROVIDER_SITE_OTHER): Payer: Medicare Other | Admitting: Internal Medicine

## 2019-05-04 ENCOUNTER — Encounter: Payer: Self-pay | Admitting: Internal Medicine

## 2019-05-04 ENCOUNTER — Other Ambulatory Visit: Payer: Self-pay

## 2019-05-04 VITALS — BP 114/76 | HR 86 | Temp 98.7°F | Wt 129.0 lb

## 2019-05-04 DIAGNOSIS — S0083XA Contusion of other part of head, initial encounter: Secondary | ICD-10-CM | POA: Diagnosis not present

## 2019-05-04 NOTE — Progress Notes (Signed)
Subjective:    Patient ID: Morgan Bell, female    DOB: Jun 05, 1951, 68 y.o.   MRN: [id number]  HPI  Patient presents to the clinic today after she sustained a fall, hitting her face.  This happened 2 days ago. She tripped in the garage, hitting her face on the legs of a chair. She does have some bleeding out of her left nostril when she blows.  She has taken Meloxicam with good relief.  She is using ice for the swelling.  She denies any dizziness, nausea, vomiting, loss of consciousness, pain, or trouble with vision.  Review of Systems  Past Medical History:  Diagnosis Date  . Arthritis   . Exertional angina ([place]) 07/16/2018  . Headache(784.0)    HX MIGRAINES   . Hypertension   . Melanoma ([place])   . Obesity   . Stroke [facility])    MINI STROKE     21 YRS AGO     Current Outpatient Medications  Medication Sig Dispense Refill  . amLODipine (NORVASC) 10 MG tablet TAKE 1/2 TABLET BY MOUTH TWICE DAILY 30 tablet 10  . amoxicillin (AMOXIL) 500 MG tablet Take 500 mg by mouth as needed. 4 TABS 1 HOUR PRIOR TO DENTAL WORK    . aspirin EC 81 MG tablet Take 81 mg daily by mouth.    . fenofibrate (TRICOR) 145 MG tablet TAKE 1 TABLET BY MOUTH DAILY 30 tablet 6  . Ferrous Sulfate (IRON SLOW RELEASE) 143 (45 Fe) MG TBCR Take 1 tablet daily by mouth.    . irbesartan (AVAPRO) 300 MG tablet TAKE 1 TABLET BY MOUTH ONE TIME DAILY 30 tablet 7  . leflunomide (ARAVA) 20 MG tablet Take 20 mg daily by mouth.    . loratadine (CLARITIN) 10 MG tablet Take 10 mg by mouth daily.    . meloxicam (MOBIC) 15 MG tablet Take 15 mg by mouth as needed.     . Multiple Vitamin (MULTIVITAMIN WITH MINERALS) TABS Take 1 tablet by mouth daily.    . OMEGA-3 KRILL OIL PO Take 1 capsule by mouth daily.    . Potassium Chloride ER 20 MEQ TBCR TAKE 2 TABLETS BY MOUTH EVERY DAY AS DIRECTED 60 tablet 2  . potassium chloride SA (K-DUR,KLOR-CON) 20 MEQ tablet Take 2 tablets (40 mEq total) by mouth daily. 60 tablet 1  . rosuvastatin  (CRESTOR) 40 MG tablet TAKE 1 TABLET BY MOUTH EVERY DAY 30 tablet 5   No current facility-administered medications for this visit.     Allergies  Allergen Reactions  . Sulfa Antibiotics     SULFA DRUGS    Family History  Problem Relation Age of Onset  . Alzheimer's disease Mother   . Heart disease Father        MI, died at 59  . Alzheimer's disease Maternal Grandmother   . Cancer Maternal Grandfather        lung  . Heart attack Paternal Grandfather   . Tuberculosis Sister     Social History   Socioeconomic History  . Marital status: Divorced    Spouse name: Not on file  . Number of children: 3  . Years of education: 51  . Highest education level: Not on file  Occupational History    Employer: [employer]    Comment: Network engineer; raises service dogs.   Social Needs  . Financial resource strain: Not on file  . Food insecurity    Worry: Not on file    Inability: Not  on file  . Transportation needs    Medical: Not on file    Non-medical: Not on file  Tobacco Use  . Smoking status: Never Smoker  . Smokeless tobacco: Never Used  Substance and Sexual Activity  . Alcohol use: No  . Drug use: No  . Sexual activity: Never  Lifestyle  . Physical activity    Days per week: Not on file    Minutes per session: Not on file  . Stress: Not on file  Relationships  . Social Herbalist on phone: Not on file    Gets together: Not on file    Attends religious service: Not on file    Active member of club or organization: Not on file    Attends meetings of clubs or organizations: Not on file    Relationship status: Not on file  . Intimate partner violence    Fear of current or ex partner: Not on file    Emotionally abused: Not on file    Physically abused: Not on file    Forced sexual activity: Not on file  Other Topics Concern  . Not on file  Social History Narrative  . Not on file     Constitutional: Denies fever, malaise, fatigue, headache or  abrupt weight changes.  HEENT: Complains of left eye redness, soreness and numbness in left cheek, and scant blood out of left nostril when blowing.  Denies any trouble swallowing or chewing. Respiratory: Denies difficulty breathing, shortness of breath, cough or sputum production.   Musculoskeletal: Complains of left face swelling.  Denies decrease in range of motion, difficulty with gait, muscle pain or joint pain. Skin: Complains of bruising to left side of face.    Neurological: Denies dizziness, difficulty with memory, difficulty with speech or problems with balance and coordination.       Objective:   Physical Exam  BP 114/76   Pulse 86   Temp 98.7 F (37.1 C) (Temporal)   Wt 129 lb (58.5 kg)   SpO2 98%   BMI 24.37 kg/m   Wt Readings from Last 3 Encounters:  02/02/19 125 lb 12.8 oz (57.1 kg)  09/06/18 134 lb (60.8 kg)  07/16/18 135 lb (61.2 kg)    General: Appears her stated age, well developed, well nourished in NAD. HEENT: Head: normal shape and size; Eyes: Sclera to left eye positive for minimal subconjuntival hemorrhage.  PERRLA and EOMs intact; Ears: Tm's gray and intact, normal light reflex; Nose: mucosa pink and moist, scant blood noted in left nare, septum deviated to the left; Throat/Mouth: Ecchymosis noted to left inner cheek. Teeth present, mucosa pink and moist, no exudate, lesions or ulcerations noted.  Neck:  Neck supple, trachea midline. No masses, Musculoskeletal: Edema and ecchymosis noted to left side of face and jaw without pain.  Normal flexion, extension and rotation of the cervical spine. No bony tenderness noted over the spine.  No difficulty with gait.  Neurological: Alert and oriented.    BMET    Component Value Date/Time   NA 139 09/06/2018 1242   NA 142 04/23/2018 0829   K 3.4 (L) 10/08/2018 0747   CL 104 09/06/2018 1242   CO2 27 09/06/2018 1242   GLUCOSE 87 09/06/2018 1242   BUN 29 (H) 09/06/2018 1242   BUN 24 04/23/2018 0829   CREATININE  1.09 09/06/2018 1242   CREATININE 1.45 (H) 11/10/2016 1418   CALCIUM 9.8 09/06/2018 1242   GFRNONAA 52 (L) 04/23/2018 5277  GFRAA 60 04/23/2018 0829    Lipid Panel     Component Value Date/Time   CHOL 104 09/06/2018 1242   CHOL 120 04/23/2018 0829   TRIG 202.0 (H) 09/06/2018 1242   HDL 22.90 (L) 09/06/2018 1242   HDL 17 (L) 04/23/2018 0829   CHOLHDL 5 09/06/2018 1242   VLDL 40.4 (H) 09/06/2018 1242   LDLCALC 41 04/23/2018 0829    CBC    Component Value Date/Time   WBC 5.9 09/06/2018 1242   RBC 4.28 09/06/2018 1242   HGB 12.3 09/06/2018 1242   HCT 37.3 09/06/2018 1242   PLT 155.0 09/06/2018 1242   MCV 87.1 09/06/2018 1242   MCV 93.1 01/16/2014 2105   MCH 28.8 01/16/2014 2105   MCH 26.2 02/28/2013 1545   MCHC 32.9 09/06/2018 1242   RDW 15.8 (H) 09/06/2018 1242   LYMPHSABS 2.3 02/28/2013 1545   MONOABS 1.0 02/28/2013 1545   EOSABS 0.3 02/28/2013 1545   BASOSABS 0.1 02/28/2013 1545    Hgb A1C Lab Results  Component Value Date   HGBA1C 5.7 08/26/2017            Assessment & Plan:   Traumatic Ecchymosis of Face:  Continue ice. No evidence of fracture. Will hold off on CT head, CT cervical spine, CT maxillofacial Continue Meloxicam or Tylenol for discomfort.   Return precautions discussed.  Webb Silversmith, NP

## 2019-05-04 NOTE — Patient Instructions (Signed)

## 2019-07-01 ENCOUNTER — Other Ambulatory Visit: Payer: Self-pay | Admitting: Internal Medicine

## 2019-07-07 DIAGNOSIS — M5136 Other intervertebral disc degeneration, lumbar region: Secondary | ICD-10-CM | POA: Diagnosis not present

## 2019-07-07 DIAGNOSIS — Z79899 Other long term (current) drug therapy: Secondary | ICD-10-CM | POA: Diagnosis not present

## 2019-07-07 DIAGNOSIS — M2550 Pain in unspecified joint: Secondary | ICD-10-CM | POA: Diagnosis not present

## 2019-07-07 DIAGNOSIS — M0589 Other rheumatoid arthritis with rheumatoid factor of multiple sites: Secondary | ICD-10-CM | POA: Diagnosis not present

## 2019-07-07 DIAGNOSIS — M150 Primary generalized (osteo)arthritis: Secondary | ICD-10-CM | POA: Diagnosis not present

## 2019-08-11 DIAGNOSIS — M545 Low back pain: Secondary | ICD-10-CM | POA: Diagnosis not present

## 2019-08-15 DIAGNOSIS — M545 Low back pain: Secondary | ICD-10-CM | POA: Diagnosis not present

## 2019-08-19 DIAGNOSIS — R030 Elevated blood-pressure reading, without diagnosis of hypertension: Secondary | ICD-10-CM | POA: Diagnosis not present

## 2019-08-19 DIAGNOSIS — M8008XA Age-related osteoporosis with current pathological fracture, vertebra(e), initial encounter for fracture: Secondary | ICD-10-CM | POA: Diagnosis not present

## 2019-08-25 DIAGNOSIS — S32020A Wedge compression fracture of second lumbar vertebra, initial encounter for closed fracture: Secondary | ICD-10-CM | POA: Diagnosis not present

## 2019-08-25 DIAGNOSIS — S32020G Wedge compression fracture of second lumbar vertebra, subsequent encounter for fracture with delayed healing: Secondary | ICD-10-CM | POA: Diagnosis not present

## 2019-09-12 ENCOUNTER — Encounter: Payer: Medicare Other | Admitting: Internal Medicine

## 2019-09-12 NOTE — Progress Notes (Deleted)
HPI:  Pt presents to the clinic today for her subsequent annual Medicare Wellness Exam. She is also due to follow up chronic conditions.  OA: Mainly in her. She takes Meloxicam as prescribed with good relief of symptoms. Leflunomide.   Angina: Exertional.  Hx of Migraines:   HTN: Her BP today is. She is taking Amlodipine and Irbesartan as prescribed. ECG from  07/2018 reviewed.  HLD with Hx of TIA: Her last LDL was 57, triglycerides 202 on 08/2018. She is taking Fenofibrate and ASA as prescribed. She tries to consume a low fat diet. She is not following with neurology.   Past Medical History:  Diagnosis Date  . Arthritis   . Exertional angina ([place]) 07/16/2018  . Headache(784.0)    HX MIGRAINES   . Hypertension   . Melanoma ([place])   . Obesity   . Stroke [facility])    MINI STROKE     21 YRS AGO     Current Outpatient Medications  Medication Sig Dispense Refill  . amLODipine (NORVASC) 10 MG tablet TAKE 1/2 TABLET BY MOUTH TWICE DAILY 30 tablet 10  . amoxicillin (AMOXIL) 500 MG tablet Take 500 mg by mouth as needed. 4 TABS 1 HOUR PRIOR TO DENTAL WORK    . aspirin EC 81 MG tablet Take 81 mg daily by mouth.    . cetirizine (ZYRTEC) 10 MG tablet Take 10 mg by mouth daily.    . fenofibrate (TRICOR) 145 MG tablet TAKE 1 TABLET BY MOUTH DAILY 30 tablet 6  . Ferrous Sulfate (IRON SLOW RELEASE) 143 (45 Fe) MG TBCR Take 1 tablet daily by mouth.    . irbesartan (AVAPRO) 300 MG tablet TAKE 1 TABLET BY MOUTH ONE TIME DAILY 30 tablet 7  . leflunomide (ARAVA) 20 MG tablet Take 20 mg daily by mouth.    . meloxicam (MOBIC) 15 MG tablet Take 15 mg by mouth as needed.     . Multiple Vitamin (MULTIVITAMIN WITH MINERALS) TABS Take 1 tablet by mouth daily.    Marland Kitchen OVER THE COUNTER MEDICATION Curamin    . Potassium Chloride ER 20 MEQ TBCR TAKE 2 TABLETS BY MOUTH EVERY DAY AS DIRECTED 60 tablet 2  . potassium chloride SA (K-DUR,KLOR-CON) 20 MEQ tablet Take 2 tablets (40 mEq total) by mouth daily. 60 tablet 1  .  rosuvastatin (CRESTOR) 40 MG tablet TAKE 1 TABLET BY MOUTH EVERY DAY 30 tablet 5   No current facility-administered medications for this visit.     Allergies  Allergen Reactions  . Sulfa Antibiotics     SULFA DRUGS    Family History  Problem Relation Age of Onset  . Alzheimer's disease Mother   . Heart disease Father        MI, died at 31  . Alzheimer's disease Maternal Grandmother   . Cancer Maternal Grandfather        lung  . Heart attack Paternal Grandfather   . Tuberculosis Sister     Social History   Socioeconomic History  . Marital status: Divorced    Spouse name: Not on file  . Number of children: 3  . Years of education: 38  . Highest education level: Not on file  Occupational History    Employer: [employer]    Comment: Network engineer; raises service dogs.   Social Needs  . Financial resource strain: Not on file  . Food insecurity    Worry: Not on file    Inability: Not on file  . Transportation needs  Medical: Not on file    Non-medical: Not on file  Tobacco Use  . Smoking status: Never Smoker  . Smokeless tobacco: Never Used  Substance and Sexual Activity  . Alcohol use: No  . Drug use: No  . Sexual activity: Never  Lifestyle  . Physical activity    Days per week: Not on file    Minutes per session: Not on file  . Stress: Not on file  Relationships  . Social Herbalist on phone: Not on file    Gets together: Not on file    Attends religious service: Not on file    Active member of club or organization: Not on file    Attends meetings of clubs or organizations: Not on file    Relationship status: Not on file  . Intimate partner violence    Fear of current or ex partner: Not on file    Emotionally abused: Not on file    Physically abused: Not on file    Forced sexual activity: Not on file  Other Topics Concern  . Not on file  Social History Narrative  . Not on file    Hospitiliaztions: None  Health  Maintenance:    Flu: 08/2018  Tetanus:  Pneumovax: 08/2017  Prevnar: 07/2016  Zostavax:  Shingrix:  Mammogram:  Pap Smear: no longer screening  Bone Density:  Colon Screening:  Eye Doctor:  Dental Exam:   Providers:   PCP: Webb Silversmith, NP   Dermatologist:  Cardiologist: Dr. Oval Linsey  ENT:  Gastroenterologist:  Pulmonologist:   I have personally reviewed and have noted:  1. The patient's medical and social history 2. Their use of alcohol, tobacco or illicit drugs 3. Their current medications and supplements 4. The patient's functional ability including ADL's, fall risks, home safety risks and hearing or visual impairment. 5. Diet and physical activities 6. Evidence for depression or mood disorder  Subjective:   Review of Systems:   Constitutional: Denies fever, malaise, fatigue, headache or abrupt weight changes.  HEENT: Denies eye pain, eye redness, ear pain, ringing in the ears, wax buildup, runny nose, nasal congestion, bloody nose, or sore throat. Respiratory: Denies difficulty breathing, shortness of breath, cough or sputum production.   Cardiovascular: Denies chest pain, chest tightness, palpitations or swelling in the hands or feet.  Gastrointestinal: Denies abdominal pain, bloating, constipation, diarrhea or blood in the stool.  GU: Denies urgency, frequency, pain with urination, burning sensation, blood in urine, odor or discharge. Musculoskeletal: Denies decrease in range of motion, difficulty with gait, muscle pain or joint pain and swelling.  Skin: Denies redness, rashes, lesions or ulcercations.  Neurological: Denies dizziness, difficulty with memory, difficulty with speech or problems with balance and coordination.  Psych: Denies anxiety, depression, SI/HI.  No other specific complaints in a complete review of systems (except as listed in HPI above).  Objective:  PE:   There were no vitals taken for this visit. Wt Readings from Last 3 Encounters:   05/04/19 129 lb (58.5 kg)  02/02/19 125 lb 12.8 oz (57.1 kg)  09/06/18 134 lb (60.8 kg)    General: Appears their stated age, well developed, well nourished in NAD. Skin: Warm, dry and intact. No rashes, lesions or ulcerations noted. HEENT: Head: normal shape and size; Eyes: sclera white, no icterus, conjunctiva pink, PERRLA and EOMs intact; Ears: Tm's gray and intact, normal light reflex; Throat/Mouth: Teeth present, mucosa pink and moist, no exudate, lesions or ulcerations noted.  Neck: Neck supple,  trachea midline. No masses, lumps or thyromegaly present.  Cardiovascular: Normal rate and rhythm. S1,S2 noted.  No murmur, rubs or gallops noted. No JVD or BLE edema. No carotid bruits noted. Pulmonary/Chest: Normal effort and positive vesicular breath sounds. No respiratory distress. No wheezes, rales or ronchi noted.  Abdomen: Soft and nontender. Normal bowel sounds. No distention or masses noted. Liver, spleen and kidneys non palpable. Musculoskeletal: Normal range of motion. Strength 5/5 BUE/BLE. No signs of joint swelling.  Neurological: Alert and oriented. Cranial nerves II-XII grossly intact. Coordination normal.  Psychiatric: Mood and affect normal. Behavior is normal. Judgment and thought content normal.   EKG:  BMET    Component Value Date/Time   NA 139 09/06/2018 1242   NA 142 04/23/2018 0829   K 3.4 (L) 10/08/2018 0747   CL 104 09/06/2018 1242   CO2 27 09/06/2018 1242   GLUCOSE 87 09/06/2018 1242   BUN 29 (H) 09/06/2018 1242   BUN 24 04/23/2018 0829   CREATININE 1.09 09/06/2018 1242   CREATININE 1.45 (H) 11/10/2016 1418   CALCIUM 9.8 09/06/2018 1242   GFRNONAA 52 (L) 04/23/2018 0829   GFRAA 60 04/23/2018 0829    Lipid Panel     Component Value Date/Time   CHOL 104 09/06/2018 1242   CHOL 120 04/23/2018 0829   TRIG 202.0 (H) 09/06/2018 1242   HDL 22.90 (L) 09/06/2018 1242   HDL 17 (L) 04/23/2018 0829   CHOLHDL 5 09/06/2018 1242   VLDL 40.4 (H) 09/06/2018 1242    LDLCALC 41 04/23/2018 0829    CBC    Component Value Date/Time   WBC 5.9 09/06/2018 1242   RBC 4.28 09/06/2018 1242   HGB 12.3 09/06/2018 1242   HCT 37.3 09/06/2018 1242   PLT 155.0 09/06/2018 1242   MCV 87.1 09/06/2018 1242   MCV 93.1 01/16/2014 2105   MCH 28.8 01/16/2014 2105   MCH 26.2 02/28/2013 1545   MCHC 32.9 09/06/2018 1242   RDW 15.8 (H) 09/06/2018 1242   LYMPHSABS 2.3 02/28/2013 1545   MONOABS 1.0 02/28/2013 1545   EOSABS 0.3 02/28/2013 1545   BASOSABS 0.1 02/28/2013 1545    Hgb A1C Lab Results  Component Value Date   HGBA1C 5.7 08/26/2017      Assessment and Plan:   Medicare Annual Wellness Visit:  Diet: Heart healthy or DM if diabetic Physical activity: Sedentary Depression/mood screen: Negative Hearing: Intact to whispered voice Visual acuity: Grossly normal, performs annual eye exam  ADLs: Capable Fall risk: None Home safety: Good Cognitive evaluation: Intact to orientation, naming, recall and repetition EOL planning: Adv directives, full code/ I agree  Preventative Medicine:   Next appointment:   Webb Silversmith, NP

## 2019-09-14 DIAGNOSIS — M8008XA Age-related osteoporosis with current pathological fracture, vertebra(e), initial encounter for fracture: Secondary | ICD-10-CM | POA: Diagnosis not present

## 2019-09-15 ENCOUNTER — Other Ambulatory Visit: Payer: Self-pay | Admitting: Neurological Surgery

## 2019-09-15 ENCOUNTER — Other Ambulatory Visit (HOSPITAL_COMMUNITY): Payer: Self-pay | Admitting: Neurological Surgery

## 2019-09-15 DIAGNOSIS — M8008XA Age-related osteoporosis with current pathological fracture, vertebra(e), initial encounter for fracture: Secondary | ICD-10-CM

## 2019-09-19 ENCOUNTER — Ambulatory Visit (HOSPITAL_COMMUNITY)
Admission: RE | Admit: 2019-09-19 | Discharge: 2019-09-19 | Disposition: A | Discharge status: Home / Self Care | Payer: Medicare Other | Source: Ambulatory Visit | Attending: Neurological Surgery | Admitting: Neurological Surgery

## 2019-09-19 ENCOUNTER — Other Ambulatory Visit: Payer: Self-pay

## 2019-09-19 DIAGNOSIS — I700 Atherosclerosis of aorta: Secondary | ICD-10-CM | POA: Insufficient documentation

## 2019-09-19 DIAGNOSIS — S32021A Stable burst fracture of second lumbar vertebra, initial encounter for closed fracture: Secondary | ICD-10-CM | POA: Diagnosis not present

## 2019-09-19 DIAGNOSIS — K449 Diaphragmatic hernia without obstruction or gangrene: Secondary | ICD-10-CM | POA: Diagnosis not present

## 2019-09-19 DIAGNOSIS — M5134 Other intervertebral disc degeneration, thoracic region: Secondary | ICD-10-CM | POA: Insufficient documentation

## 2019-09-19 DIAGNOSIS — M5126 Other intervertebral disc displacement, lumbar region: Secondary | ICD-10-CM | POA: Diagnosis not present

## 2019-09-19 DIAGNOSIS — M5137 Other intervertebral disc degeneration, lumbosacral region: Secondary | ICD-10-CM | POA: Diagnosis not present

## 2019-09-19 DIAGNOSIS — X58XXXA Exposure to other specified factors, initial encounter: Secondary | ICD-10-CM | POA: Insufficient documentation

## 2019-09-19 DIAGNOSIS — M810 Age-related osteoporosis without current pathological fracture: Secondary | ICD-10-CM | POA: Diagnosis not present

## 2019-09-19 DIAGNOSIS — M48062 Spinal stenosis, lumbar region with neurogenic claudication: Secondary | ICD-10-CM | POA: Insufficient documentation

## 2019-09-19 DIAGNOSIS — M5136 Other intervertebral disc degeneration, lumbar region: Secondary | ICD-10-CM | POA: Insufficient documentation

## 2019-09-19 DIAGNOSIS — N281 Cyst of kidney, acquired: Secondary | ICD-10-CM | POA: Diagnosis not present

## 2019-09-19 DIAGNOSIS — M5124 Other intervertebral disc displacement, thoracic region: Secondary | ICD-10-CM | POA: Diagnosis not present

## 2019-09-19 DIAGNOSIS — M8008XA Age-related osteoporosis with current pathological fracture, vertebra(e), initial encounter for fracture: Secondary | ICD-10-CM

## 2019-09-19 MED ORDER — DIAZEPAM 5 MG PO TABS
10.0000 mg | ORAL_TABLET | Freq: Once | ORAL | Status: AC
  Administered 2019-09-19: 11:00:00 10 mg via ORAL
  Filled 2019-09-19: qty 2

## 2019-09-19 MED ORDER — IOHEXOL 180 MG/ML  SOLN
20.0000 mL | Freq: Once | INTRAMUSCULAR | Status: AC | PRN
  Administered 2019-09-19: 12:00:00 12 mL via INTRATHECAL

## 2019-09-19 MED ORDER — DIAZEPAM 5 MG PO TABS
ORAL_TABLET | ORAL | Status: AC
  Administered 2019-09-19: 10 mg via ORAL
  Filled 2019-09-19: qty 2

## 2019-09-19 MED ORDER — LIDOCAINE HCL (PF) 1 % IJ SOLN
5.0000 mL | Freq: Once | INTRAMUSCULAR | Status: AC
  Administered 2019-09-19: 12:00:00 2 mL via INTRADERMAL

## 2019-09-19 MED ORDER — ONDANSETRON HCL 4 MG/2ML IJ SOLN: 4.0000 mg | Freq: Four times a day (QID) | INTRAMUSCULAR | Status: DC | PRN

## 2019-09-19 MED ORDER — OXYCODONE-ACETAMINOPHEN 5-325 MG PO TABS: 1.0000 | ORAL_TABLET | ORAL | Status: DC | PRN

## 2019-09-19 NOTE — Discharge Instructions (Signed)
Myelogram and Lumbar Puncture Discharge Instructions  1. Go home and rest quietly for the next 24 hours.  It is important to lie flat for the next 24 hours.  Get up only to go to the restroom.  You may lie in the bed or on a couch on your back, your stomach, your left side or your right side.  You may have one pillow under your head.  You may have pillows between your knees while you are on your side or under your knees while you are on your back.  2. DO NOT drive today.  Recline the seat as far back as it will go, while still wearing your seat belt, on the way home.  3. You may get up to go to the bathroom as needed.  You may sit up for 10 minutes to eat.  You may resume your normal diet and medications unless otherwise indicated.  4. The incidence of headache, nausea, or vomiting is about 5% (one in 20 patients).  If you develop a headache, lie flat and drink plenty of fluids until the headache goes away.  Caffeinated beverages may be helpful.  If you develop severe nausea and vomiting or a headache that does not go away with flat bed rest, call Dr Elsner's office.  5. You may resume normal activities after your 24 hours of bed rest is over; however, do not exert yourself strongly or do any heavy lifting tomorrow.  6. Call your physician for a follow-up appointment.  The results of your myelogram will be sent directly to your physician by the following day.  7. If you have any questions or if complications develop after you arrive home, please call Dr Elsner's office.  Discharge instructions have been explained to the patient.  The patient, or the person responsible for the patient, fully understands these instructions.   

## 2019-09-19 NOTE — Progress Notes (Signed)
Discharge instructions reviewed with patient and son. Verbalized understanding.  

## 2019-09-19 NOTE — Procedures (Signed)
Morgan Bell is a 68 year old female who had an acrylic balloon kyphoplasty of L2 vertebrae secondary to a compression fracture.  She has since developed worsening back pain and also leg pain into both lower extremities.  Is concerned that she has now fractured L1.  Because of the severe stenosis that she may be incurring I have advised a myelogram and post myelogram CAT scan before any surgical intervention is considered for her compression fracture.  She is now being admitted for that procedure.  Pre op Dx: Lumbar stenosis, osteoporotic compression fracture Post op Dx: Same Procedure: Lumbar myelogram Surgeon: Elsner Puncture level: L2-3 Fluid color: Clear colorless Injection: Iohexol 200, 12 mL Findings: Moderately severe stenosis further work-up with CT scanning

## 2019-09-27 ENCOUNTER — Other Ambulatory Visit (HOSPITAL_COMMUNITY): Payer: Self-pay | Admitting: Neurological Surgery

## 2019-09-27 ENCOUNTER — Other Ambulatory Visit: Payer: Self-pay | Admitting: Neurological Surgery

## 2019-09-27 DIAGNOSIS — M8008XA Age-related osteoporosis with current pathological fracture, vertebra(e), initial encounter for fracture: Secondary | ICD-10-CM

## 2019-09-28 ENCOUNTER — Telehealth: Payer: Self-pay | Admitting: *Deleted

## 2019-10-03 ENCOUNTER — Other Ambulatory Visit: Payer: Self-pay | Admitting: Neurological Surgery

## 2019-10-03 IMAGING — CT CT HEART MORP W/ CTA COR W/ SCORE W/ CA W/CM &/OR W/O CM
4 of 7 series · 8 of 20 positions shown, 9 images · non-contrast
Comparison: None.

Addendum:
EXAM:
OVER-READ INTERPRETATION  CT CHEST

The following report is an over-read performed by radiologist Dr.
Sakosh Nugman [REDACTED] on 09/13/2018. This
over-read does not include interpretation of cardiac or coronary
anatomy or pathology. The coronary CTA interpretation by the
cardiologist is attached.
CLINICAL DATA: 67F with hypertension, hyperlipidemia, and prior
stroke with chest pain and shortness of breath.
Cardiac/Coronary  CT
TECHNIQUE: The patient was scanned on a Phillips Force scanner.

[Series 6: best diast 72 % · axial · 0.32mm/px · z∈[-225,-180]mm · 2 of 336 slices shown, 3 images]
[im 112/336  vessel]
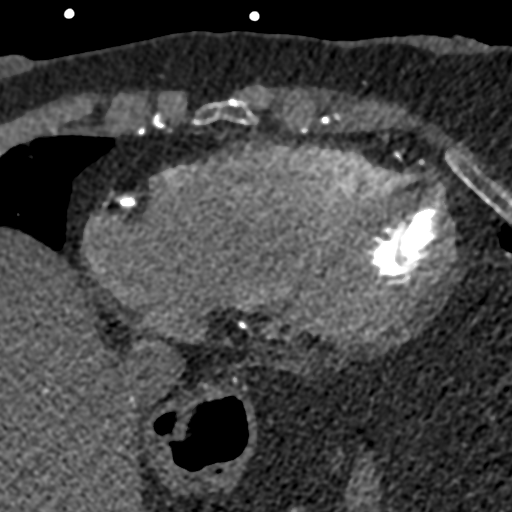
[im 112/336  lung]
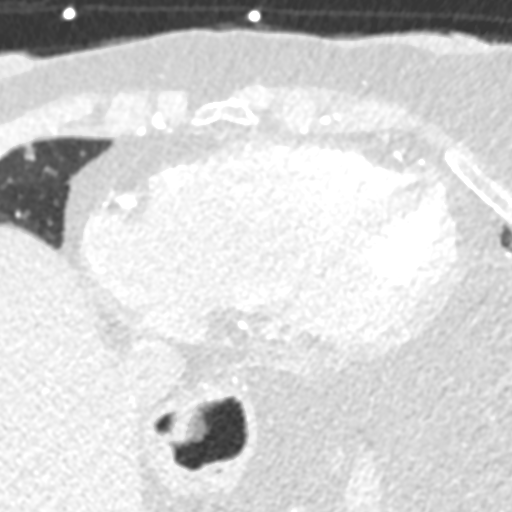
[im 224/336  vessel]
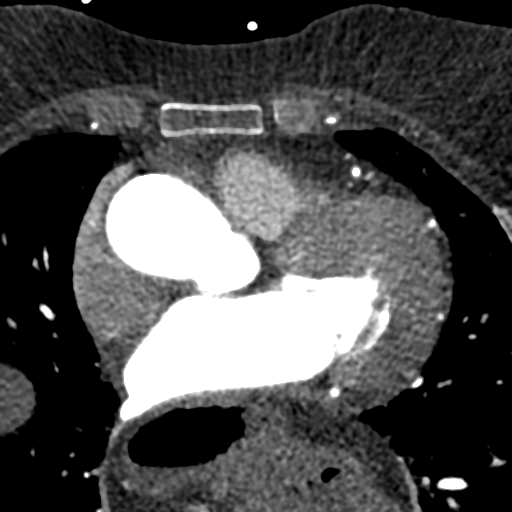

[Series 7: best syst 46 % · axial · 0.32mm/px · z∈[-225,-180]mm · 2 of 336 slices shown]
[im 112/336  vessel]
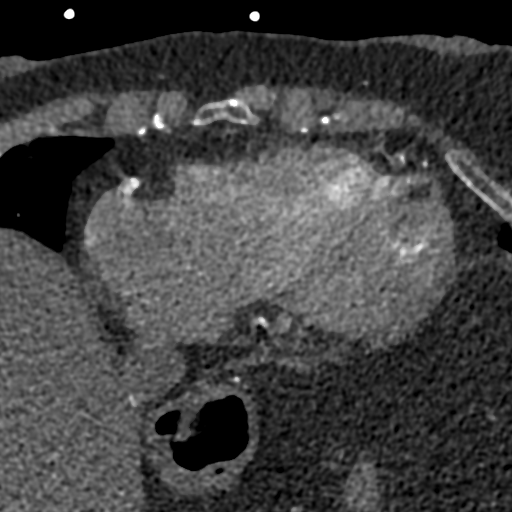
[im 224/336  vessel]
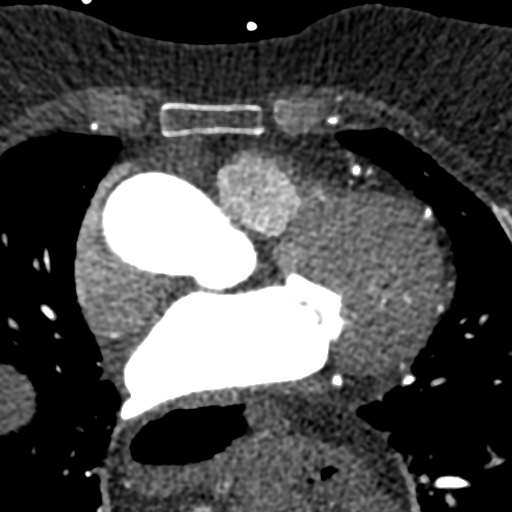

[Series 8: ts diast sharp 72 % · axial · 0.32mm/px · z∈[-225,-180]mm · 2 of 336 slices shown]
[im 112/336  lung]
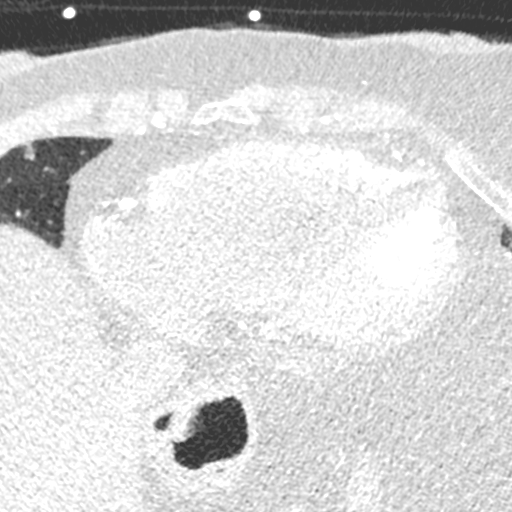
[im 224/336  lung]
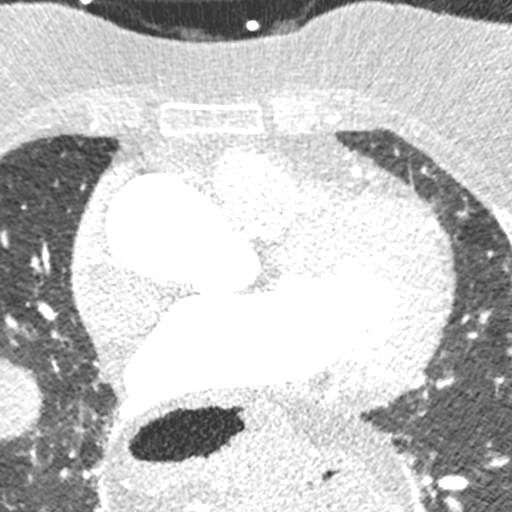

[Series 9: ts syst sharp 46 % · axial · 0.32mm/px · z∈[-225,-180]mm · 2 of 336 slices shown]
[im 112/336  lung]
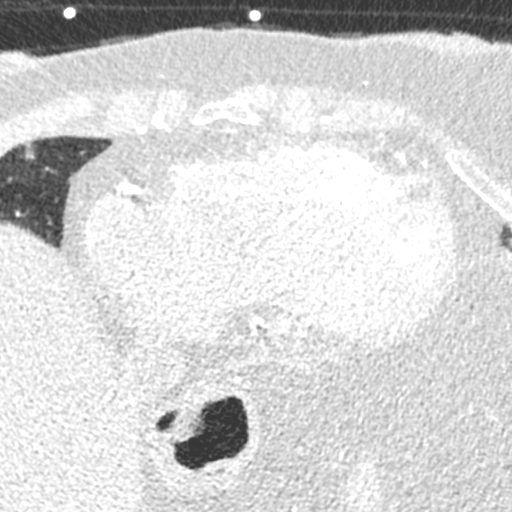
[im 224/336  lung]
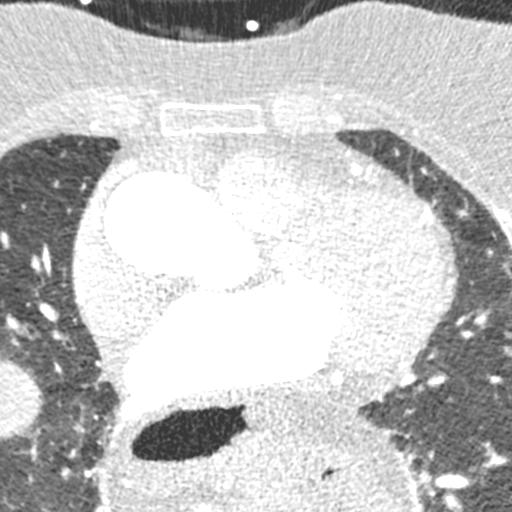

[8 of 20 positions shown; findings below may reference images not displayed]

FINDINGS: Limited view of the lung parenchyma demonstrates no suspicious
nodularity. Airways are normal.

Limited view of the mediastinum demonstrates no adenopathy.
Esophagus normal.

Limited view of the upper abdomen demonstrates a large hiatal hernia
posterior the heart..

Limited view of the skeleton and chest wall is unremarkable.
IMPRESSION: Large hiatal hernia.
FINDINGS: A 120 kV prospective scan was triggered in the descending thoracic
aorta at 111 HU's. Axial non-contrast 3 mm slices were carried out
through the heart. The data set was analyzed on a dedicated work
station and scored using the Agatson method. Gantry rotation speed
was 250 msecs and collimation was .6 mm. No beta blockade and 0.8 mg
of sl NTG was given. The 3D data set was reconstructed in 5%
intervals of the 67-82 % of the R-R cycle. Diastolic phases were
analyzed on a dedicated work station using MPR, MIP and VRT modes.
The patient received 80 cc of contrast.

Aorta: Normal size. Ascending aorta 3.4 cm. Minimal calcification of
the aortic root. No dissection.

Aortic Valve:  Trileaflet.  No calcifications.

Coronary Arteries:  Normal coronary origin.  Right dominance.

RCA is a large dominant artery that gives rise to PDA and PLVB.
There is minimal (1-24%) calcified plaque proximally.

Left main is a large artery that gives rise to LAD and LCX arteries.

LAD is a large vessel. There is minimal (1-24%) calcified plaque in
the proximal to mid LAD.

LCX is a non-dominant artery that gives rise to one large OM1
branch. There is no plaque.

Other findings:

Normal pulmonary vein drainage into the left atrium.

Normal let atrial appendage without a thrombus.

Normal size of the pulmonary artery.
IMPRESSION: 1. Coronary calcium score of 62.1. This was 68th percentile for age
and sex matched control.

2. Normal coronary origin with right dominance.

3. No evidence of obstructive CAD.

*** End of Addendum ***

## 2019-10-04 ENCOUNTER — Encounter (HOSPITAL_COMMUNITY): Payer: Self-pay

## 2019-10-04 ENCOUNTER — Ambulatory Visit (HOSPITAL_COMMUNITY): Payer: Medicare Other

## 2019-10-08 ENCOUNTER — Other Ambulatory Visit: Payer: Self-pay | Admitting: Cardiovascular Disease

## 2019-10-08 ENCOUNTER — Other Ambulatory Visit: Payer: Self-pay | Admitting: Internal Medicine

## 2019-10-11 NOTE — Progress Notes (Addendum)
[facility] [place], [address] [address] Phone: [phone] Fax: [phone]      Your procedure is scheduled on Tuesday, October 18, 2019 at 7:30 AM.  Report to [facility] Main Entrance "A" at 5:30 A.M., and check in at the Admitting office.  Call this number if you have problems the morning of surgery:  [phone]   Remember:  Do not eat or drink after midnight the night before your surgery    Take these medicines the morning of surgery with A SIP OF WATER:  Amlodipine (Norvasc) Cetirizine (Zyrtec) Fenofibrate (Tricor) Gabapentin (Neurotin) Hydrocodone-acetaminophen (Norco/Vicodin) - if needed Leflunomide (Arava) Rosuvastatin (Crestor)  7 days prior to surgery STOP taking any Aspirin (unless otherwise instructed by your surgeon), Aleve, Naproxen, Ibuprofen, Motrin, Advil, Goody's, BC's, all herbal medications, fish oil, all vitamins, and Meloxicam (Mobic)    The Morning of Surgery  Do not wear jewelry, make-up or nail polish.  Do not wear lotions, powders, perfumes, or deodorant  Do not shave 48 hours prior to surgery.  Men may shave face and neck.  Do not bring valuables to the hospital.  [facility] is not responsible for any belongings or valuables.  If you are a smoker, DO NOT Smoke 24 hours prior to surgery  If you wear a CPAP at night please bring your mask, tubing, and machine the morning of surgery   Remember that you must have someone to transport you home after your surgery, and remain with you for 24 hours if you are discharged the same day.   Please bring cases for contacts, glasses, hearing aids, dentures or bridgework because it cannot be worn into surgery.    Leave your suitcase in the car.  After surgery it may be brought to your room.  For patients admitted to the hospital, discharge time will be determined by your treatment  team.  Patients discharged the day of surgery will not be allowed to drive home.    Special instructions:   [place]- Preparing For Surgery  Before surgery, you can play an important role. Because skin is not sterile, your skin needs to be as free of germs as possible. You can reduce the number of germs on your skin by washing with CHG (chlorahexidine gluconate) Soap before surgery.  CHG is an antiseptic cleaner which kills germs and bonds with the skin to continue killing germs even after washing.    Oral Hygiene is also important to reduce your risk of infection.  Remember - BRUSH YOUR TEETH THE MORNING OF SURGERY WITH YOUR REGULAR TOOTHPASTE  Please do not use if you have an allergy to CHG or antibacterial soaps. If your skin becomes reddened/irritated stop using the CHG.  Do not shave (including legs and underarms) for at least 48 hours prior to first CHG shower. It is OK to shave your face.  Please follow these instructions carefully.   1. Shower the NIGHT BEFORE SURGERY and the MORNING OF SURGERY with CHG Soap.   2. If you chose to wash your hair, wash your hair first as usual with your normal shampoo.  3. After you shampoo, rinse your hair and body thoroughly to remove the shampoo.  4. Use CHG as you would any other liquid soap. You can apply CHG directly to the skin and wash gently with a scrungie or a clean washcloth.   5.  Apply the CHG Soap to your body ONLY FROM THE NECK DOWN.  Do not use on open wounds or open sores. Avoid contact with your eyes, ears, mouth and genitals (private parts). Wash Face and genitals (private parts)  with your normal soap.   6. Wash thoroughly, paying special attention to the area where your surgery will be performed.  7. Thoroughly rinse your body with warm water from the neck down.  8. DO NOT shower/wash with your normal soap after using and rinsing off the CHG Soap.  9. Pat yourself dry with a CLEAN TOWEL.  10. Wear CLEAN PAJAMAS to bed  the night before surgery, wear comfortable clothes the morning of surgery  11. Place CLEAN SHEETS on your bed the night of your first shower and DO NOT SLEEP WITH PETS.    Day of Surgery:  Please shower the morning of surgery with the CHG soap Do not apply any deodorants/lotions. Please wear clean clothes to the hospital/surgery center.   Remember to brush your teeth WITH YOUR REGULAR TOOTHPASTE.   Please read over the following fact sheets that you were given.

## 2019-10-12 ENCOUNTER — Telehealth: Payer: Self-pay

## 2019-10-12 ENCOUNTER — Other Ambulatory Visit: Payer: Self-pay

## 2019-10-12 ENCOUNTER — Encounter (HOSPITAL_COMMUNITY)
Admission: RE | Admit: 2019-10-12 | Discharge: 2019-10-12 | Disposition: A | Discharge status: Home / Self Care | Payer: Medicare Other | Source: Ambulatory Visit | Attending: Neurological Surgery | Admitting: Neurological Surgery

## 2019-10-12 ENCOUNTER — Encounter (HOSPITAL_COMMUNITY): Payer: Self-pay

## 2019-10-12 DIAGNOSIS — M069 Rheumatoid arthritis, unspecified: Secondary | ICD-10-CM | POA: Insufficient documentation

## 2019-10-12 DIAGNOSIS — M8000XA Age-related osteoporosis with current pathological fracture, unspecified site, initial encounter for fracture: Secondary | ICD-10-CM | POA: Insufficient documentation

## 2019-10-12 DIAGNOSIS — I10 Essential (primary) hypertension: Secondary | ICD-10-CM | POA: Insufficient documentation

## 2019-10-12 DIAGNOSIS — I2510 Atherosclerotic heart disease of native coronary artery without angina pectoris: Secondary | ICD-10-CM | POA: Insufficient documentation

## 2019-10-12 DIAGNOSIS — Z7982 Long term (current) use of aspirin: Secondary | ICD-10-CM | POA: Diagnosis not present

## 2019-10-12 DIAGNOSIS — Z8673 Personal history of transient ischemic attack (TIA), and cerebral infarction without residual deficits: Secondary | ICD-10-CM | POA: Diagnosis not present

## 2019-10-12 DIAGNOSIS — Z85820 Personal history of malignant melanoma of skin: Secondary | ICD-10-CM | POA: Diagnosis not present

## 2019-10-12 DIAGNOSIS — Z01818 Encounter for other preprocedural examination: Secondary | ICD-10-CM | POA: Diagnosis not present

## 2019-10-12 DIAGNOSIS — Z79899 Other long term (current) drug therapy: Secondary | ICD-10-CM | POA: Diagnosis not present

## 2019-10-12 HISTORY — DX: Family history of other specified conditions: Z84.89

## 2019-10-12 LAB — BASIC METABOLIC PANEL
Anion gap: 9 (ref 5–15)
BUN: 25 mg/dL — ABNORMAL HIGH (ref 8–23)
CO2: 24 mmol/L (ref 22–32)
Calcium: 9.6 mg/dL (ref 8.9–10.3)
Chloride: 109 mmol/L (ref 98–111)
Creatinine, Ser: 1.39 mg/dL — ABNORMAL HIGH (ref 0.44–1.00)
GFR calc Af Amer: 44 mL/min — ABNORMAL LOW (ref >60)
GFR calc non Af Amer: 38 mL/min — ABNORMAL LOW (ref >60)
Glucose, Bld: 130 mg/dL — ABNORMAL HIGH (ref 70–99)
Potassium: 3.9 mmol/L (ref 3.5–5.1)
Sodium: 142 mmol/L (ref 135–145)

## 2019-10-12 LAB — CBC
HCT: 40.2 % (ref 36.0–46.0)
Hemoglobin: 12.7 g/dL (ref 12.0–15.0)
MCH: 30.2 pg (ref 26.0–34.0)
MCHC: 31.6 g/dL (ref 30.0–36.0)
MCV: 95.7 fL (ref 80.0–100.0)
Platelets: 172 10*3/uL (ref 150–400)
RBC: 4.2 MIL/uL (ref 3.87–5.11)
RDW: 17.1 % — ABNORMAL HIGH (ref 11.5–15.5)
WBC: 6.5 10*3/uL (ref 4.0–10.5)
nRBC: 0 % (ref 0.0–0.2)

## 2019-10-12 LAB — TYPE AND SCREEN
ABO/RH(D): O POS
Antibody Screen: NEGATIVE

## 2019-10-12 LAB — SURGICAL PCR SCREEN
MRSA, PCR: NEGATIVE
Staphylococcus aureus: POSITIVE — AB

## 2019-10-12 NOTE — Telephone Encounter (Signed)
Pt left v/m that she has appt for flu shot on 10/16/18 and pt is going to have back surgery on 10/17/18. Pt request to cancel appt on 10/16/18. Pt request cb. Per pts appt info 10/16/18 is for CPX. Fwd note to [place].

## 2019-10-12 NOTE — Progress Notes (Addendum)
PCP - [place]  Chest x-ray - N/A EKG - 10/12/19 Stress Test - 2003 ECHO - denies Cardiac Cath - denies  Sleep Study - denies  Aspirin Instructions: Patient instructed to hold all Aspirin, NSAID's, herbal medications, fish oil and vitamins 7 days prior to surgery.    COVID TEST- 10/15/19 at [facility]. Pt instructed to remain in their car. Educated on Transport planner until [employer].   Anesthesia review: review EKG  Patient denies shortness of breath, fever, cough and chest pain at PAT appointment   All instructions explained to the patient, with a verbal understanding of the material. Patient agrees to go over the instructions while at home for a better understanding. Patient also instructed to self quarantine after being tested for COVID-19. The opportunity to ask questions was provided.

## 2019-10-13 ENCOUNTER — Encounter (HOSPITAL_COMMUNITY): Payer: Self-pay

## 2019-10-13 NOTE — Progress Notes (Addendum)
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  History includes never smoker, HTN, TIA (age 79, no residual deficits), RA, migraines, hiatal hernia (by 02/28/13 CXR), melanoma (s/p wide excision right abdominal wall melanoma, sentinel node mapping, right axilla 03/02/13). She reported exertional chest pressure in fall 2019, so CT coronary done showing < 25% stenosis in LAD and RCA. Continued medical management recommended at her 01/2019 evaluation (telemedicine) with cardiologist Dr. Oval Linsey.  It does not appear that EKG was updated at her PAT visit. She does have a known abnormal EKG with LVH/repolarization abnormality (anterolateral T wave abnormality) but had only minimal CAD on CT coronary 1 year ago.  Pre-surgical COVID-19 test is scheduled for 10/15/19. Anesthesia team to evaluate on the day of surgery.   VS: BP (!) 159/95   Pulse 89   Temp 36.4 C   Resp 19   Ht 5\' 1"  (1.549 m)   Wt 54.8 kg   SpO2 99%   BMI 22.82 kg/m    PROVIDERS: Jearld Fenton, NP is PCP Skeet Latch, MD is cardiologist. Last evaluation 02/02/19 with one year follow-up recommended.   LABS: Labs reviewed: Acceptable for surgery. Cr 1.39, previously ~ 1.09-1.45 in 2018-2019.  (all labs ordered are listed, but only abnormal results are displayed)  Labs Reviewed  SURGICAL PCR SCREEN - Abnormal; Notable  for the following components:      Result Value   Staphylococcus aureus POSITIVE (*)    All other components within normal limits  BASIC METABOLIC PANEL - Abnormal; Notable for the following components:   Glucose, Bld 130 (*)    BUN 25 (*)    Creatinine, Ser 1.39 (*)    GFR calc non Af Amer 38 (*)    GFR calc Af Amer 44 (*)    All other components within normal limits  CBC - Abnormal; Notable for the following components:   RDW 17.1 (*)    All other components within normal limits  TYPE AND SCREEN     IMAGES: CT L-spine with myelogram 09/19/19: LUMBAR MYELOGRAM IMPRESSION: Near complete block at L1-2 due to burst fracture of L2 with retropulsion of bone into the canal. Small amount of contrast extends into the thoracic spine. CT LUMBAR MYELOGRAM IMPRESSION: Burst fracture of L2 with retropulsion of bone fragments into the spinal canal. Bilateral L2 pedicle fractures. Severe spinal stenosis at L1-2. Moderate right foraminal encroachment at L1-2. Mild to moderate spinal stenosis L2-3.   EKG: It does not appear that updated EKG was done at her PAT visit, so will need one on the day of surgery. She does have a known abnormal EKG (LVH, anterolateral ST/T wave abnormality 07/16/18), but had minimal CAD on coronary CT 09/2018.   CV: CT coronary 09/13/18: FINDINGS: - Aorta: Normal size. Ascending aorta 3.4 cm. Minimal calcification of the aortic root. No dissection. - Aortic  Valve:  Trileaflet.  No calcifications. - Coronary Arteries:  Normal coronary origin.  Right dominance. - RCA is a large dominant artery that gives rise to PDA and PLVB. There is minimal (1-24%) calcified plaque proximally. - Left main is a large artery that gives rise to LAD and LCX arteries. - LAD is a large vessel. There is minimal (1-24%) calcified plaque in the proximal to mid LAD. - LCX is a non-dominant artery that gives rise to one large OM1 branch. There is no plaque. OTHER FINDINGS: - Normal  pulmonary vein drainage into the left atrium. - Normal let atrial appendage without a thrombus. - Normal size of the pulmonary artery. IMPRESSION: 1. Coronary calcium score of 62.1. This was 68th percentile for age and sex matched control. 2. Normal coronary origin with right dominance. 3. No evidence of obstructive CAD.  Screening carotid artery duplex in 2004 was within normal range.   Past Medical History:  Diagnosis Date  . Arthritis   . Exertional angina ([place]) 07/16/2018  . Family history of adverse reaction to anesthesia    son has difficulty waking   . Headache(784.0)    HX MIGRAINES   . Hypertension   . Melanoma ([place])   . Obesity   . Stroke [facility])    MINI STROKE     21 YRS AGO     Past Surgical History:  Procedure Laterality Date  . CESAREAN SECTION  11/26/1973  . CESAREAN SECTION  09/22/1975  . CESAREAN SECTION  04/30/1978  . HEMORRHOID SURGERY  06/1987  . MELANOMA EXCISION WITH SENTINEL LYMPH NODE BIOPSY Right 03/02/2013   Procedure: Wide MELANOMA EXCISION Right Abdominal wall WITH SENTINEL LYMPH NODE mapping Right Axilla;  Surgeon: Merrie Roof, MD;  Location: [place];  Service: General;  Laterality: Right;  . NM [place]  2003   negative Bruce protocol exercise stress test with no evidence of perfusion abnormality, EF 66%  . TOTAL HIP ARTHROPLASTY Right 03/2011    MEDICATIONS: . amLODipine (NORVASC) 10 MG tablet  . amoxicillin (AMOXIL) 500 MG tablet  . aspirin EC 81 MG tablet  . cetirizine (ZYRTEC) 10 MG tablet  . fenofibrate (TRICOR) 145 MG tablet  . Ferrous Sulfate (IRON SLOW RELEASE) 143 (45 Fe) MG TBCR  . gabapentin (NEURONTIN) 100 MG capsule  . HYDROcodone-acetaminophen (NORCO/VICODIN) 5-325 MG tablet  . irbesartan (AVAPRO) 300 MG tablet  . leflunomide (ARAVA) 20 MG tablet  . meloxicam (MOBIC) 15 MG tablet  . Multiple Vitamin (MULTIVITAMIN WITH MINERALS) TABS  . Potassium Chloride ER 20 MEQ TBCR  . potassium chloride SA  (K-DUR,KLOR-CON) 20 MEQ tablet  . rosuvastatin (CRESTOR) 40 MG tablet   No current facility-administered medications for this encounter.  ASA is being held for 7 days prior to surgery.   Myra Gianotti, PA-C Surgical Short Stay/Anesthesiology [facility] Phone [phone] [facility] Phone [phone] 10/13/2019 9:40 AM

## 2019-10-13 NOTE — Anesthesia Preprocedure Evaluation (Addendum)
Anesthesia Evaluation  Patient identified by MRN, date of birth, ID band Patient awake    Reviewed: Allergy & Precautions, NPO status , Patient's Chart, lab work & pertinent test results  Airway Mallampati: II  TM Distance: >3 FB     Dental  (+) Dental Advisory Given   Pulmonary neg pulmonary ROS,    breath sounds clear to auscultation       Cardiovascular METS: 5 - 7 Mets hypertension, (-) angina Rhythm:Regular Rate:Normal  Prior to back injury was walking roughly 39mile per day without chest pain or SOB  Coronary CT 09/13/18: IMPRESSION: 1. Coronary calcium score of 62.1. This was 68th percentile for age and sex matched control. 2. Normal coronary origin with right dominance. 3. No evidence of obstructive CAD. (1-24% LAD, RCA)    Neuro/Psych TIACVA    GI/Hepatic negative GI ROS, Neg liver ROS,   Endo/Other  negative endocrine ROS  Renal/GU Renal InsufficiencyRenal disease     Musculoskeletal  (+) Arthritis ,   Abdominal   Peds  Hematology negative hematology ROS (+)   Anesthesia Other Findings   Reproductive/Obstetrics                           Lab Results  Component Value Date   WBC 6.5 10/12/2019   HGB 12.7 10/12/2019   HCT 40.2 10/12/2019   MCV 95.7 10/12/2019   PLT 172 10/12/2019   Lab Results  Component Value Date   CREATININE 1.39 (H) 10/12/2019   BUN 25 (H) 10/12/2019   NA 142 10/12/2019   K 3.9 10/12/2019   CL 109 10/12/2019   CO2 24 10/12/2019    Anesthesia Physical Anesthesia Plan  ASA: III  Anesthesia Plan: General   Post-op Pain Management:    Induction: Intravenous  PONV Risk Score and Plan: 3 and Dexamethasone, Ondansetron and Treatment may vary due to age or medical condition  Airway Management Planned: Oral ETT  Additional Equipment: Arterial line  Intra-op Plan:   Post-operative Plan: Extubation in OR and Possible Post-op  intubation/ventilation  Informed Consent: I have reviewed the patients History and Physical, chart, labs and discussed the procedure including the risks, benefits and alternatives for the proposed anesthesia with the patient or authorized representative who has indicated his/her understanding and acceptance.     Dental advisory given  Plan Discussed with: CRNA  Anesthesia Plan Comments: (See PAT note written 10/13/2019 by Myra Gianotti, PA-C. Needs updated EKG. Has known LVH, anterolateral T wave abnormalitly. Minimal CAD by 09/2018 coronary CT. Cardiologist is Dr. Skeet Latch. Last evaluation 01/2019 with one year follow-up recommended. Denies CP and SOB with exertion. Was walking >95mile per day and exercising regularly prior to back injury. )      Anesthesia Quick Evaluation

## 2019-10-15 ENCOUNTER — Other Ambulatory Visit (HOSPITAL_COMMUNITY)
Admission: RE | Admit: 2019-10-15 | Discharge: 2019-10-15 | Disposition: A | Discharge status: Home / Self Care | Payer: Medicare Other | Source: Ambulatory Visit | Attending: Neurological Surgery | Admitting: Neurological Surgery

## 2019-10-15 DIAGNOSIS — Z8249 Family history of ischemic heart disease and other diseases of the circulatory system: Secondary | ICD-10-CM | POA: Diagnosis not present

## 2019-10-15 DIAGNOSIS — Z8673 Personal history of transient ischemic attack (TIA), and cerebral infarction without residual deficits: Secondary | ICD-10-CM | POA: Diagnosis not present

## 2019-10-15 DIAGNOSIS — X58XXXD Exposure to other specified factors, subsequent encounter: Secondary | ICD-10-CM | POA: Diagnosis present

## 2019-10-15 DIAGNOSIS — M069 Rheumatoid arthritis, unspecified: Secondary | ICD-10-CM | POA: Diagnosis not present

## 2019-10-15 DIAGNOSIS — Z01812 Encounter for preprocedural laboratory examination: Secondary | ICD-10-CM | POA: Insufficient documentation

## 2019-10-15 DIAGNOSIS — Z7401 Bed confinement status: Secondary | ICD-10-CM | POA: Diagnosis not present

## 2019-10-15 DIAGNOSIS — M810 Age-related osteoporosis without current pathological fracture: Secondary | ICD-10-CM | POA: Diagnosis not present

## 2019-10-15 DIAGNOSIS — G959 Disease of spinal cord, unspecified: Secondary | ICD-10-CM | POA: Diagnosis not present

## 2019-10-15 DIAGNOSIS — M47816 Spondylosis without myelopathy or radiculopathy, lumbar region: Secondary | ICD-10-CM | POA: Diagnosis not present

## 2019-10-15 DIAGNOSIS — M4856XA Collapsed vertebra, not elsewhere classified, lumbar region, initial encounter for fracture: Secondary | ICD-10-CM | POA: Diagnosis not present

## 2019-10-15 DIAGNOSIS — C4359 Malignant melanoma of other part of trunk: Secondary | ICD-10-CM | POA: Diagnosis not present

## 2019-10-15 DIAGNOSIS — M48061 Spinal stenosis, lumbar region without neurogenic claudication: Secondary | ICD-10-CM | POA: Diagnosis not present

## 2019-10-15 DIAGNOSIS — Z20822 Contact with and (suspected) exposure to covid-19: Secondary | ICD-10-CM | POA: Diagnosis not present

## 2019-10-15 DIAGNOSIS — R5381 Other malaise: Secondary | ICD-10-CM | POA: Diagnosis not present

## 2019-10-15 DIAGNOSIS — Z85820 Personal history of malignant melanoma of skin: Secondary | ICD-10-CM | POA: Diagnosis not present

## 2019-10-15 DIAGNOSIS — I10 Essential (primary) hypertension: Secondary | ICD-10-CM | POA: Diagnosis not present

## 2019-10-15 DIAGNOSIS — M4316 Spondylolisthesis, lumbar region: Secondary | ICD-10-CM | POA: Diagnosis present

## 2019-10-15 DIAGNOSIS — G43909 Migraine, unspecified, not intractable, without status migrainosus: Secondary | ICD-10-CM | POA: Diagnosis not present

## 2019-10-15 DIAGNOSIS — I499 Cardiac arrhythmia, unspecified: Secondary | ICD-10-CM | POA: Diagnosis not present

## 2019-10-15 DIAGNOSIS — S32001A Stable burst fracture of unspecified lumbar vertebra, initial encounter for closed fracture: Secondary | ICD-10-CM | POA: Diagnosis not present

## 2019-10-15 DIAGNOSIS — E46 Unspecified protein-calorie malnutrition: Secondary | ICD-10-CM | POA: Diagnosis not present

## 2019-10-15 DIAGNOSIS — Z96641 Presence of right artificial hip joint: Secondary | ICD-10-CM | POA: Diagnosis not present

## 2019-10-15 DIAGNOSIS — I2510 Atherosclerotic heart disease of native coronary artery without angina pectoris: Secondary | ICD-10-CM | POA: Diagnosis not present

## 2019-10-15 DIAGNOSIS — E6609 Other obesity due to excess calories: Secondary | ICD-10-CM | POA: Diagnosis not present

## 2019-10-15 DIAGNOSIS — E669 Obesity, unspecified: Secondary | ICD-10-CM | POA: Diagnosis not present

## 2019-10-15 DIAGNOSIS — M4726 Other spondylosis with radiculopathy, lumbar region: Secondary | ICD-10-CM | POA: Diagnosis not present

## 2019-10-15 DIAGNOSIS — R52 Pain, unspecified: Secondary | ICD-10-CM | POA: Diagnosis not present

## 2019-10-15 DIAGNOSIS — Z7982 Long term (current) use of aspirin: Secondary | ICD-10-CM | POA: Diagnosis not present

## 2019-10-15 DIAGNOSIS — Z6822 Body mass index (BMI) 22.0-22.9, adult: Secondary | ICD-10-CM | POA: Diagnosis not present

## 2019-10-15 DIAGNOSIS — Z882 Allergy status to sulfonamides status: Secondary | ICD-10-CM | POA: Diagnosis not present

## 2019-10-15 DIAGNOSIS — M2550 Pain in unspecified joint: Secondary | ICD-10-CM | POA: Diagnosis not present

## 2019-10-15 DIAGNOSIS — M48062 Spinal stenosis, lumbar region with neurogenic claudication: Secondary | ICD-10-CM | POA: Diagnosis not present

## 2019-10-15 DIAGNOSIS — R2681 Unsteadiness on feet: Secondary | ICD-10-CM | POA: Diagnosis not present

## 2019-10-15 DIAGNOSIS — G992 Myelopathy in diseases classified elsewhere: Secondary | ICD-10-CM | POA: Diagnosis not present

## 2019-10-15 DIAGNOSIS — M5416 Radiculopathy, lumbar region: Secondary | ICD-10-CM | POA: Diagnosis not present

## 2019-10-15 DIAGNOSIS — Z4789 Encounter for other orthopedic aftercare: Secondary | ICD-10-CM | POA: Diagnosis not present

## 2019-10-15 DIAGNOSIS — Z5189 Encounter for other specified aftercare: Secondary | ICD-10-CM | POA: Diagnosis not present

## 2019-10-15 DIAGNOSIS — G459 Transient cerebral ischemic attack, unspecified: Secondary | ICD-10-CM | POA: Diagnosis not present

## 2019-10-15 DIAGNOSIS — S32021G Stable burst fracture of second lumbar vertebra, subsequent encounter for fracture with delayed healing: Secondary | ICD-10-CM | POA: Diagnosis not present

## 2019-10-15 DIAGNOSIS — M4716 Other spondylosis with myelopathy, lumbar region: Secondary | ICD-10-CM | POA: Diagnosis not present

## 2019-10-15 DIAGNOSIS — Z79899 Other long term (current) drug therapy: Secondary | ICD-10-CM | POA: Diagnosis not present

## 2019-10-15 DIAGNOSIS — Z981 Arthrodesis status: Secondary | ICD-10-CM | POA: Diagnosis not present

## 2019-10-15 DIAGNOSIS — G834 Cauda equina syndrome: Secondary | ICD-10-CM | POA: Diagnosis not present

## 2019-10-15 LAB — SARS CORONAVIRUS 2 (TAT 6-24 HRS): SARS Coronavirus 2: NEGATIVE

## 2019-10-17 ENCOUNTER — Encounter: Payer: Medicare Other | Admitting: Internal Medicine

## 2019-10-18 ENCOUNTER — Inpatient Hospital Stay (HOSPITAL_COMMUNITY): Payer: Medicare Other

## 2019-10-18 ENCOUNTER — Other Ambulatory Visit: Payer: Self-pay

## 2019-10-18 ENCOUNTER — Encounter (HOSPITAL_COMMUNITY)
Admission: RE | Disposition: A | Discharge status: Skilled Nursing Facility | Payer: Self-pay | Source: Home / Self Care | Attending: Neurological Surgery

## 2019-10-18 ENCOUNTER — Inpatient Hospital Stay (HOSPITAL_COMMUNITY): Payer: Medicare Other | Admitting: Anesthesiology

## 2019-10-18 ENCOUNTER — Inpatient Hospital Stay (HOSPITAL_COMMUNITY)
Admission: RE | Admit: 2019-10-18 | Discharge: 2019-10-20 | DRG: 454 | Disposition: A | Discharge status: Skilled Nursing Facility | Payer: Medicare Other | Attending: Neurological Surgery | Admitting: Neurological Surgery

## 2019-10-18 ENCOUNTER — Encounter (HOSPITAL_COMMUNITY): Payer: Self-pay | Admitting: Neurological Surgery

## 2019-10-18 ENCOUNTER — Inpatient Hospital Stay (HOSPITAL_COMMUNITY): Payer: Medicare Other | Admitting: Vascular Surgery

## 2019-10-18 DIAGNOSIS — S32021G Stable burst fracture of second lumbar vertebra, subsequent encounter for fracture with delayed healing: Secondary | ICD-10-CM

## 2019-10-18 DIAGNOSIS — Z8673 Personal history of transient ischemic attack (TIA), and cerebral infarction without residual deficits: Secondary | ICD-10-CM

## 2019-10-18 DIAGNOSIS — Z882 Allergy status to sulfonamides status: Secondary | ICD-10-CM | POA: Diagnosis not present

## 2019-10-18 DIAGNOSIS — Z96641 Presence of right artificial hip joint: Secondary | ICD-10-CM | POA: Diagnosis present

## 2019-10-18 DIAGNOSIS — G959 Disease of spinal cord, unspecified: Secondary | ICD-10-CM | POA: Diagnosis present

## 2019-10-18 DIAGNOSIS — Z7982 Long term (current) use of aspirin: Secondary | ICD-10-CM | POA: Diagnosis not present

## 2019-10-18 DIAGNOSIS — M47816 Spondylosis without myelopathy or radiculopathy, lumbar region: Secondary | ICD-10-CM | POA: Diagnosis present

## 2019-10-18 DIAGNOSIS — Z79899 Other long term (current) drug therapy: Secondary | ICD-10-CM | POA: Diagnosis not present

## 2019-10-18 DIAGNOSIS — X58XXXD Exposure to other specified factors, subsequent encounter: Secondary | ICD-10-CM | POA: Diagnosis present

## 2019-10-18 DIAGNOSIS — M810 Age-related osteoporosis without current pathological fracture: Secondary | ICD-10-CM | POA: Diagnosis present

## 2019-10-18 DIAGNOSIS — Z6822 Body mass index (BMI) 22.0-22.9, adult: Secondary | ICD-10-CM

## 2019-10-18 DIAGNOSIS — R5381 Other malaise: Secondary | ICD-10-CM | POA: Diagnosis present

## 2019-10-18 DIAGNOSIS — M4316 Spondylolisthesis, lumbar region: Secondary | ICD-10-CM | POA: Diagnosis present

## 2019-10-18 DIAGNOSIS — M4856XA Collapsed vertebra, not elsewhere classified, lumbar region, initial encounter for fracture: Secondary | ICD-10-CM | POA: Diagnosis present

## 2019-10-18 DIAGNOSIS — Z8249 Family history of ischemic heart disease and other diseases of the circulatory system: Secondary | ICD-10-CM | POA: Diagnosis not present

## 2019-10-18 DIAGNOSIS — Z419 Encounter for procedure for purposes other than remedying health state, unspecified: Secondary | ICD-10-CM

## 2019-10-18 DIAGNOSIS — G834 Cauda equina syndrome: Secondary | ICD-10-CM | POA: Diagnosis present

## 2019-10-18 DIAGNOSIS — Z85820 Personal history of malignant melanoma of skin: Secondary | ICD-10-CM

## 2019-10-18 DIAGNOSIS — M5416 Radiculopathy, lumbar region: Secondary | ICD-10-CM | POA: Diagnosis present

## 2019-10-18 DIAGNOSIS — Z20822 Contact with and (suspected) exposure to covid-19: Secondary | ICD-10-CM | POA: Diagnosis present

## 2019-10-18 DIAGNOSIS — E669 Obesity, unspecified: Secondary | ICD-10-CM | POA: Diagnosis present

## 2019-10-18 DIAGNOSIS — M48062 Spinal stenosis, lumbar region with neurogenic claudication: Secondary | ICD-10-CM | POA: Diagnosis present

## 2019-10-18 DIAGNOSIS — I10 Essential (primary) hypertension: Secondary | ICD-10-CM | POA: Diagnosis present

## 2019-10-18 DIAGNOSIS — Z981 Arthrodesis status: Secondary | ICD-10-CM | POA: Diagnosis not present

## 2019-10-18 HISTORY — PX: APPLICATION OF ROBOTIC ASSISTANCE FOR SPINAL PROCEDURE: SHX6753

## 2019-10-18 SURGERY — POSTERIOR LUMBAR FUSION 1 LEVEL
Anesthesia: General | Site: Spine Lumbar

## 2019-10-18 MED ORDER — PHENYLEPHRINE 40 MCG/ML (10ML) SYRINGE FOR IV PUSH (FOR BLOOD PRESSURE SUPPORT)
PREFILLED_SYRINGE | INTRAVENOUS | Status: AC
  Filled 2019-10-18: qty 20

## 2019-10-18 MED ORDER — POLYETHYLENE GLYCOL 3350 17 G PO PACK
17.0000 g | PACK | Freq: Every day | ORAL | Status: DC | PRN
  Administered 2019-10-19: 17 g via ORAL
  Filled 2019-10-18: qty 1

## 2019-10-18 MED ORDER — DEXAMETHASONE SODIUM PHOSPHATE 10 MG/ML IJ SOLN
INTRAMUSCULAR | Status: DC | PRN
  Administered 2019-10-18: 10 mg via INTRAVENOUS

## 2019-10-18 MED ORDER — FERROUS SULFATE 325 (65 FE) MG PO TABS
ORAL_TABLET | Freq: Every day | ORAL | Status: DC
  Administered 2019-10-18 – 2019-10-20 (×3): 325 mg via ORAL
  Filled 2019-10-18 (×3): qty 1

## 2019-10-18 MED ORDER — DOCUSATE SODIUM 100 MG PO CAPS
100.0000 mg | ORAL_CAPSULE | Freq: Two times a day (BID) | ORAL | Status: DC
  Administered 2019-10-18 – 2019-10-20 (×5): 100 mg via ORAL
  Filled 2019-10-18 (×5): qty 1

## 2019-10-18 MED ORDER — DEXAMETHASONE SODIUM PHOSPHATE 10 MG/ML IJ SOLN
INTRAMUSCULAR | Status: AC
  Filled 2019-10-18: qty 1

## 2019-10-18 MED ORDER — SUCCINYLCHOLINE CHLORIDE 200 MG/10ML IV SOSY
PREFILLED_SYRINGE | INTRAVENOUS | Status: DC | PRN
  Administered 2019-10-18: 100 mg via INTRAVENOUS

## 2019-10-18 MED ORDER — ONDANSETRON HCL 4 MG/2ML IJ SOLN
INTRAMUSCULAR | Status: DC | PRN
  Administered 2019-10-18: 4 mg via INTRAVENOUS

## 2019-10-18 MED ORDER — PHENYLEPHRINE 40 MCG/ML (10ML) SYRINGE FOR IV PUSH (FOR BLOOD PRESSURE SUPPORT)
PREFILLED_SYRINGE | INTRAVENOUS | Status: DC | PRN
  Administered 2019-10-18: 80 ug via INTRAVENOUS
  Administered 2019-10-18: 120 ug via INTRAVENOUS
  Administered 2019-10-18: 80 ug via INTRAVENOUS
  Administered 2019-10-18: 120 ug via INTRAVENOUS
  Administered 2019-10-18 (×2): 80 ug via INTRAVENOUS
  Administered 2019-10-18: 120 ug via INTRAVENOUS

## 2019-10-18 MED ORDER — ONDANSETRON HCL 4 MG/2ML IJ SOLN: 4.0000 mg | Freq: Four times a day (QID) | INTRAMUSCULAR | Status: DC | PRN

## 2019-10-18 MED ORDER — ACETAMINOPHEN 10 MG/ML IV SOLN
INTRAVENOUS | Status: AC
  Filled 2019-10-18: qty 100

## 2019-10-18 MED ORDER — THROMBIN 20000 UNITS EX SOLR
CUTANEOUS | Status: AC
  Filled 2019-10-18: qty 20000

## 2019-10-18 MED ORDER — METHOCARBAMOL 1000 MG/10ML IJ SOLN
500.0000 mg | Freq: Four times a day (QID) | INTRAVENOUS | Status: DC | PRN
  Filled 2019-10-18: qty 5

## 2019-10-18 MED ORDER — GABAPENTIN 100 MG PO CAPS
100.0000 mg | ORAL_CAPSULE | Freq: Four times a day (QID) | ORAL | Status: DC
  Administered 2019-10-18 – 2019-10-20 (×7): 100 mg via ORAL
  Filled 2019-10-18 (×7): qty 1

## 2019-10-18 MED ORDER — EPHEDRINE 5 MG/ML INJ
INTRAVENOUS | Status: AC
  Filled 2019-10-18: qty 10

## 2019-10-18 MED ORDER — PHENOL 1.4 % MT LIQD: 1.0000 | OROMUCOSAL | Status: DC | PRN

## 2019-10-18 MED ORDER — AMLODIPINE BESYLATE 5 MG PO TABS
10.0000 mg | ORAL_TABLET | Freq: Every day | ORAL | Status: DC
  Administered 2019-10-19 – 2019-10-20 (×2): 10 mg via ORAL
  Filled 2019-10-18 (×2): qty 2

## 2019-10-18 MED ORDER — THROMBIN 5000 UNITS EX SOLR
CUTANEOUS | Status: AC
  Filled 2019-10-18: qty 5000

## 2019-10-18 MED ORDER — 0.9 % SODIUM CHLORIDE (POUR BTL) OPTIME
TOPICAL | Status: DC | PRN
  Administered 2019-10-18: 1000 mL

## 2019-10-18 MED ORDER — LORATADINE 10 MG PO TABS
10.0000 mg | ORAL_TABLET | Freq: Every day | ORAL | Status: DC
  Administered 2019-10-19 – 2019-10-20 (×2): 10 mg via ORAL
  Filled 2019-10-18 (×2): qty 1

## 2019-10-18 MED ORDER — POTASSIUM CHLORIDE ER 20 MEQ PO TBCR: 1.0000 | EXTENDED_RELEASE_TABLET | Freq: Two times a day (BID) | ORAL | Status: DC

## 2019-10-18 MED ORDER — SENNA 8.6 MG PO TABS
1.0000 | ORAL_TABLET | Freq: Two times a day (BID) | ORAL | Status: DC
  Administered 2019-10-18 – 2019-10-20 (×5): 8.6 mg via ORAL
  Filled 2019-10-18 (×5): qty 1

## 2019-10-18 MED ORDER — CEFAZOLIN SODIUM-DEXTROSE 2-4 GM/100ML-% IV SOLN
2.0000 g | INTRAVENOUS | Status: AC
  Administered 2019-10-18: 2 g via INTRAVENOUS
  Filled 2019-10-18: qty 100

## 2019-10-18 MED ORDER — AMOXICILLIN 500 MG PO TABS: 2000.0000 mg | ORAL_TABLET | ORAL | Status: DC

## 2019-10-18 MED ORDER — LACTATED RINGERS IV SOLN: INTRAVENOUS | Status: DC | PRN

## 2019-10-18 MED ORDER — ROSUVASTATIN CALCIUM 20 MG PO TABS
40.0000 mg | ORAL_TABLET | Freq: Every day | ORAL | Status: DC
  Administered 2019-10-19 – 2019-10-20 (×2): 40 mg via ORAL
  Filled 2019-10-18 (×2): qty 2

## 2019-10-18 MED ORDER — CHLORHEXIDINE GLUCONATE CLOTH 2 % EX PADS: 6.0000 | MEDICATED_PAD | Freq: Once | CUTANEOUS | Status: DC

## 2019-10-18 MED ORDER — FENTANYL CITRATE (PF) 100 MCG/2ML IJ SOLN
INTRAMUSCULAR | Status: AC
  Filled 2019-10-18: qty 2

## 2019-10-18 MED ORDER — IRBESARTAN 300 MG PO TABS
300.0000 mg | ORAL_TABLET | Freq: Every day | ORAL | Status: DC
  Administered 2019-10-19 – 2019-10-20 (×2): 300 mg via ORAL
  Filled 2019-10-18 (×3): qty 1

## 2019-10-18 MED ORDER — METHOCARBAMOL 500 MG PO TABS
500.0000 mg | ORAL_TABLET | Freq: Four times a day (QID) | ORAL | Status: DC | PRN
  Administered 2019-10-18 – 2019-10-20 (×5): 500 mg via ORAL
  Filled 2019-10-18 (×5): qty 1

## 2019-10-18 MED ORDER — ACETAMINOPHEN 10 MG/ML IV SOLN
INTRAVENOUS | Status: DC | PRN
  Administered 2019-10-18: 1000 mg via INTRAVENOUS

## 2019-10-18 MED ORDER — HYDROCODONE-ACETAMINOPHEN 5-325 MG PO TABS
1.0000 | ORAL_TABLET | ORAL | Status: DC | PRN
  Administered 2019-10-18: 2 via ORAL
  Administered 2019-10-18 – 2019-10-19 (×2): 1 via ORAL
  Administered 2019-10-19 (×3): 2 via ORAL
  Administered 2019-10-19: 1 via ORAL
  Administered 2019-10-20 (×3): 2 via ORAL
  Filled 2019-10-18 (×3): qty 2
  Filled 2019-10-18 (×2): qty 1
  Filled 2019-10-18: qty 2
  Filled 2019-10-18: qty 1
  Filled 2019-10-18 (×4): qty 2

## 2019-10-18 MED ORDER — LEFLUNOMIDE 20 MG PO TABS
20.0000 mg | ORAL_TABLET | Freq: Every day | ORAL | Status: DC
  Administered 2019-10-19 – 2019-10-20 (×2): 20 mg via ORAL
  Filled 2019-10-18 (×2): qty 1

## 2019-10-18 MED ORDER — FENTANYL CITRATE (PF) 250 MCG/5ML IJ SOLN
INTRAMUSCULAR | Status: AC
  Filled 2019-10-18: qty 5

## 2019-10-18 MED ORDER — FLEET ENEMA 7-19 GM/118ML RE ENEM: 1.0000 | ENEMA | Freq: Once | RECTAL | Status: DC | PRN

## 2019-10-18 MED ORDER — THROMBIN 20000 UNITS EX SOLR: CUTANEOUS | Status: DC | PRN

## 2019-10-18 MED ORDER — ONDANSETRON HCL 4 MG PO TABS: 4.0000 mg | ORAL_TABLET | Freq: Four times a day (QID) | ORAL | Status: DC | PRN

## 2019-10-18 MED ORDER — SODIUM CHLORIDE 0.9% FLUSH
3.0000 mL | Freq: Two times a day (BID) | INTRAVENOUS | Status: DC
  Administered 2019-10-18: 3 mL via INTRAVENOUS

## 2019-10-18 MED ORDER — SODIUM CHLORIDE 0.9 % IV SOLN: INTRAVENOUS | Status: DC | PRN

## 2019-10-18 MED ORDER — ACETAMINOPHEN 325 MG PO TABS: 650.0000 mg | ORAL_TABLET | ORAL | Status: DC | PRN

## 2019-10-18 MED ORDER — LIDOCAINE-EPINEPHRINE 1 %-1:100000 IJ SOLN
INTRAMUSCULAR | Status: AC
  Filled 2019-10-18: qty 1

## 2019-10-18 MED ORDER — LIDOCAINE-EPINEPHRINE 1 %-1:100000 IJ SOLN
INTRAMUSCULAR | Status: DC | PRN
  Administered 2019-10-18: 15 mL

## 2019-10-18 MED ORDER — BUPIVACAINE HCL (PF) 0.5 % IJ SOLN
INTRAMUSCULAR | Status: DC | PRN
  Administered 2019-10-18: 15 mL

## 2019-10-18 MED ORDER — MENTHOL 3 MG MT LOZG: 1.0000 | LOZENGE | OROMUCOSAL | Status: DC | PRN

## 2019-10-18 MED ORDER — THROMBIN 5000 UNITS EX SOLR: OROMUCOSAL | Status: DC | PRN

## 2019-10-18 MED ORDER — CEFAZOLIN SODIUM-DEXTROSE 2-4 GM/100ML-% IV SOLN
2.0000 g | Freq: Three times a day (TID) | INTRAVENOUS | Status: AC
  Administered 2019-10-18 – 2019-10-19 (×2): 2 g via INTRAVENOUS
  Filled 2019-10-18 (×2): qty 100

## 2019-10-18 MED ORDER — MORPHINE SULFATE (PF) 2 MG/ML IV SOLN
2.0000 mg | INTRAVENOUS | Status: DC | PRN
  Administered 2019-10-18: 2 mg via INTRAVENOUS
  Filled 2019-10-18: qty 1

## 2019-10-18 MED ORDER — LIDOCAINE 2% (20 MG/ML) 5 ML SYRINGE
INTRAMUSCULAR | Status: DC | PRN
  Administered 2019-10-18: 80 mg via INTRAVENOUS

## 2019-10-18 MED ORDER — PROPOFOL 10 MG/ML IV BOLUS
INTRAVENOUS | Status: DC | PRN
  Administered 2019-10-18: 130 mg via INTRAVENOUS

## 2019-10-18 MED ORDER — FENOFIBRATE 160 MG PO TABS
160.0000 mg | ORAL_TABLET | Freq: Every day | ORAL | Status: DC
  Administered 2019-10-19 – 2019-10-20 (×2): 160 mg via ORAL
  Filled 2019-10-18 (×2): qty 1

## 2019-10-18 MED ORDER — PROPOFOL 500 MG/50ML IV EMUL
INTRAVENOUS | Status: DC | PRN
  Administered 2019-10-18: 75 ug/kg/min via INTRAVENOUS

## 2019-10-18 MED ORDER — PROMETHAZINE HCL 25 MG/ML IJ SOLN: 6.2500 mg | INTRAMUSCULAR | Status: DC | PRN

## 2019-10-18 MED ORDER — BISACODYL 10 MG RE SUPP: 10.0000 mg | Freq: Every day | RECTAL | Status: DC | PRN

## 2019-10-18 MED ORDER — ACETAMINOPHEN 650 MG RE SUPP: 650.0000 mg | RECTAL | Status: DC | PRN

## 2019-10-18 MED ORDER — ONDANSETRON HCL 4 MG/2ML IJ SOLN
INTRAMUSCULAR | Status: AC
  Filled 2019-10-18: qty 2

## 2019-10-18 MED ORDER — KETOROLAC TROMETHAMINE 15 MG/ML IJ SOLN
7.5000 mg | Freq: Four times a day (QID) | INTRAMUSCULAR | Status: AC
  Administered 2019-10-18 – 2019-10-19 (×3): 7.5 mg via INTRAVENOUS
  Filled 2019-10-18 (×3): qty 1

## 2019-10-18 MED ORDER — POTASSIUM CHLORIDE CRYS ER 20 MEQ PO TBCR
40.0000 meq | EXTENDED_RELEASE_TABLET | Freq: Every day | ORAL | Status: DC
  Administered 2019-10-19 – 2019-10-20 (×2): 40 meq via ORAL
  Filled 2019-10-18 (×2): qty 2

## 2019-10-18 MED ORDER — EPHEDRINE SULFATE-NACL 50-0.9 MG/10ML-% IV SOSY
PREFILLED_SYRINGE | INTRAVENOUS | Status: DC | PRN
  Administered 2019-10-18: 10 mg via INTRAVENOUS
  Administered 2019-10-18: 5 mg via INTRAVENOUS

## 2019-10-18 MED ORDER — FENTANYL CITRATE (PF) 100 MCG/2ML IJ SOLN
25.0000 ug | INTRAMUSCULAR | Status: DC | PRN
  Administered 2019-10-18 (×2): 25 ug via INTRAVENOUS

## 2019-10-18 MED ORDER — SODIUM CHLORIDE 0.9% FLUSH: 3.0000 mL | INTRAVENOUS | Status: DC | PRN

## 2019-10-18 MED ORDER — LIDOCAINE 2% (20 MG/ML) 5 ML SYRINGE
INTRAMUSCULAR | Status: AC
  Filled 2019-10-18: qty 5

## 2019-10-18 MED ORDER — PROPOFOL 10 MG/ML IV BOLUS
INTRAVENOUS | Status: AC
  Filled 2019-10-18: qty 20

## 2019-10-18 MED ORDER — ALUM & MAG HYDROXIDE-SIMETH 200-200-20 MG/5ML PO SUSP: 30.0000 mL | Freq: Four times a day (QID) | ORAL | Status: DC | PRN

## 2019-10-18 MED ORDER — LACTATED RINGERS IV SOLN: INTRAVENOUS | Status: DC

## 2019-10-18 MED ORDER — FENTANYL CITRATE (PF) 250 MCG/5ML IJ SOLN
INTRAMUSCULAR | Status: DC | PRN
  Administered 2019-10-18: 100 ug via INTRAVENOUS
  Administered 2019-10-18: 50 ug via INTRAVENOUS

## 2019-10-18 MED ORDER — BUPIVACAINE HCL (PF) 0.5 % IJ SOLN
INTRAMUSCULAR | Status: AC
  Filled 2019-10-18: qty 30

## 2019-10-18 MED ORDER — PHENYLEPHRINE HCL-NACL 10-0.9 MG/250ML-% IV SOLN
INTRAVENOUS | Status: DC | PRN
  Administered 2019-10-18: 35 ug/min via INTRAVENOUS

## 2019-10-18 MED ORDER — SUCCINYLCHOLINE CHLORIDE 200 MG/10ML IV SOSY
PREFILLED_SYRINGE | INTRAVENOUS | Status: AC
  Filled 2019-10-18: qty 10

## 2019-10-18 SURGICAL SUPPLY — 84 items
ADH SKN CLS APL DERMABOND .7 (GAUZE/BANDAGES/DRESSINGS) ×6
APL SRG 60D 8 XTD TIP BNDBL (TIP)
BAG DECANTER FOR FLEXI CONT (MISCELLANEOUS) ×3 IMPLANT
BASKET BONE COLLECTION (BASKET) ×3 IMPLANT
BIT DRILL LONG 3.0X30 (BIT) IMPLANT
BIT DRILL LONG 3X80 (BIT) IMPLANT
BIT DRILL LONG 4X80 (BIT) IMPLANT
BIT DRILL SHORT 3.0X30 (BIT) IMPLANT
BIT DRILL SHORT 3X80 (BIT) IMPLANT
BUR MATCHSTICK NEURO 3.0 LAGG (BURR) ×3 IMPLANT
CANISTER SUCT 3000ML PPV (MISCELLANEOUS) ×3 IMPLANT
CEMENT BONE KYPHX HV R (Orthopedic Implant) ×2 IMPLANT
CEMENT KYPHON C01A KIT/MIXER (Cement) ×2 IMPLANT
CLIP NEUROVISION LG (CLIP) ×2 IMPLANT
CONT SPEC 4OZ CLIKSEAL STRL BL (MISCELLANEOUS) ×3 IMPLANT
COVER BACK TABLE 60X90IN (DRAPES) ×3 IMPLANT
DECANTER SPIKE VIAL GLASS SM (MISCELLANEOUS) ×3 IMPLANT
DERMABOND ADVANCED (GAUZE/BANDAGES/DRESSINGS) ×3
DERMABOND ADVANCED .7 DNX12 (GAUZE/BANDAGES/DRESSINGS) ×2 IMPLANT
DEVICE DISSECT PLASMABLAD 3.0S (MISCELLANEOUS) ×2 IMPLANT
DIGITIZER BENDINI (MISCELLANEOUS) ×1 IMPLANT
DRAPE C-ARM 42X72 X-RAY (DRAPES) ×6 IMPLANT
DRAPE HALF SHEET 40X57 (DRAPES) ×2 IMPLANT
DRAPE LAPAROTOMY 100X72X124 (DRAPES) ×3 IMPLANT
DRAPE SHEET LG 3/4 BI-LAMINATE (DRAPES) ×3 IMPLANT
DRAPE WARM FLUID 44X44 (DRAPES) ×2 IMPLANT
DURAPREP 26ML APPLICATOR (WOUND CARE) ×3 IMPLANT
DURASEAL APPLICATOR TIP (TIP) IMPLANT
DURASEAL SPINE SEALANT 3ML (MISCELLANEOUS) IMPLANT
ELECT REM PT RETURN 9FT ADLT (ELECTROSURGICAL) ×3
ELECTRODE REM PT RTRN 9FT ADLT (ELECTROSURGICAL) ×2 IMPLANT
GAUZE 4X4 16PLY RFD (DISPOSABLE) IMPLANT
GAUZE SPONGE 4X4 12PLY STRL (GAUZE/BANDAGES/DRESSINGS) ×3 IMPLANT
GLOVE BIOGEL PI IND STRL 6.5 (GLOVE) IMPLANT
GLOVE BIOGEL PI IND STRL 7.0 (GLOVE) IMPLANT
GLOVE BIOGEL PI IND STRL 7.5 (GLOVE) IMPLANT
GLOVE BIOGEL PI IND STRL 8.5 (GLOVE) ×4 IMPLANT
GLOVE BIOGEL PI INDICATOR 6.5 (GLOVE) ×2
GLOVE BIOGEL PI INDICATOR 7.0 (GLOVE) ×2
GLOVE BIOGEL PI INDICATOR 7.5 (GLOVE) ×2
GLOVE BIOGEL PI INDICATOR 8.5 (GLOVE) ×2
GLOVE ECLIPSE 8.5 STRL (GLOVE) ×6 IMPLANT
GLOVE SS N UNI LF 6.5 STRL (GLOVE) ×2 IMPLANT
GLOVE SS N UNI LF 7.0 STRL (GLOVE) ×2 IMPLANT
GLOVE SS N UNI LF 7.5 STRL (GLOVE) ×4 IMPLANT
GOWN STRL REUS W/ TWL LRG LVL3 (GOWN DISPOSABLE) IMPLANT
GOWN STRL REUS W/ TWL XL LVL3 (GOWN DISPOSABLE) IMPLANT
GOWN STRL REUS W/TWL 2XL LVL3 (GOWN DISPOSABLE) ×6 IMPLANT
GOWN STRL REUS W/TWL LRG LVL3 (GOWN DISPOSABLE) ×6
GOWN STRL REUS W/TWL XL LVL3 (GOWN DISPOSABLE) ×6
GUIDEWIRE NITINOL BEVEL TIP (WIRE) ×20 IMPLANT
HEMOSTAT POWDER KIT SURGIFOAM (HEMOSTASIS) ×2 IMPLANT
KIT BASIN OR (CUSTOM PROCEDURE TRAY) ×3 IMPLANT
KIT SPINE MAZOR X ROBO DISP (MISCELLANEOUS) ×3 IMPLANT
KIT TURNOVER KIT B (KITS) ×3 IMPLANT
MILL MEDIUM DISP (BLADE) ×1 IMPLANT
MODULE NVM5 NEXT GEN EMG (NEEDLE) ×1 IMPLANT
NDL RELINE-OR FENS 16 (NEEDLE) IMPLANT
NEEDLE HYPO 22GX1.5 SAFETY (NEEDLE) ×3 IMPLANT
NEEDLE RELINE-OR FENS 16 (NEEDLE) ×30 IMPLANT
NS IRRIG 1000ML POUR BTL (IV SOLUTION) ×3 IMPLANT
PACK LAMINECTOMY NEURO (CUSTOM PROCEDURE TRAY) ×3 IMPLANT
PATTIES SURGICAL .5 X1 (DISPOSABLE) ×3 IMPLANT
PLASMABLADE 3.0S (MISCELLANEOUS) ×3
PUSHER RELINE FENS 36 OR (MISCELLANEOUS) ×10 IMPLANT
ROD RELINE MAS ST 5.5X300MM (Rod) ×4 IMPLANT
SCREW LOCK RELINE 5.5 TULIP (Screw) ×10 IMPLANT
SCREW RELINE 4.5X40 2FC POLY (Screw) ×4 IMPLANT
SCREW RELINE PA 2FC 5.5X40 (Screw) ×6 IMPLANT
SCREW RELINE PA 6.5X40 (Screw) ×2 IMPLANT
SCREW SCHANZ SA 4.0MM (MISCELLANEOUS) ×1 IMPLANT
SPONGE LAP 4X18 RFD (DISPOSABLE) IMPLANT
SPONGE SURGIFOAM ABS GEL 100 (HEMOSTASIS) ×3 IMPLANT
SUT PROLENE 6 0 BV (SUTURE) IMPLANT
SUT VIC AB 1 CT1 18XBRD ANBCTR (SUTURE) ×2 IMPLANT
SUT VIC AB 1 CT1 8-18 (SUTURE) ×3
SUT VIC AB 2-0 CP2 18 (SUTURE) ×3 IMPLANT
SUT VIC AB 3-0 SH 8-18 (SUTURE) ×3 IMPLANT
TIP FENS REPLACE RELINE (MISCELLANEOUS) ×20 IMPLANT
TOWEL GREEN STERILE (TOWEL DISPOSABLE) ×3 IMPLANT
TOWEL GREEN STERILE FF (TOWEL DISPOSABLE) ×3 IMPLANT
TRAY FOLEY MTR SLVR 16FR STAT (SET/KITS/TRAYS/PACK) ×3 IMPLANT
TUBE MAZOR SA REDUCTION (TUBING) ×3 IMPLANT
WATER STERILE IRR 1000ML POUR (IV SOLUTION) ×3 IMPLANT

## 2019-10-18 NOTE — Op Note (Signed)
Date of surgery: January 50,021 Preoperative diagnosis: Spondylosis and stenosis status post L2 burst fracture with poor healing.  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 Surgeon: Morgan Bell First Assistant: Deri Fuelling, MD Anesthesia: General endotracheal Indications: Morgan Bell is a 69 year old individuals had significant problems with back pain and increasing weakness in her lower extremities secondary to a poorly healing L2 fracture.  She did undergo a kyphoplasty some months ago but was having increasing pain and now weakness in her lower extremities she was advised regarding decompression via posterior approach with segmental stabilization from T11-L4.  Procedure: Patient was brought to the operating room supine on the stretcher after the smooth induction of general endotracheal anesthesia electrodes were applied to the major muscle groups in the lower extremities.  Baseline recordings were obtained.  Then the patient was carefully turned prone onto the Sullivan table and the bony prominences were appropriately padded and protected.  The surgical robot was then connected to the [place] table and the arm was connected to the patient via a Steinmann pin placed in the posterior suprailiac crest on the left side.  Once this was completed and the back was prepped and draped with alcohol DuraPrep in a sterile fashion and initial localizing radiographs were taken to register the robotic arm to the patient's CT scan.  Screw placement had been prechosen on an operative plan from T11-L4 and the  plan had been verified preoperatively.  Then once the registration was complete we marked this areas of entrance for the screws and T11 T12-L1 L2-3 and L4.  Then incisions were made individually at T11-T12 and L1 and jointly it for L3 and L4 because of their proximity.  The area was infiltrated with lidocaine with epinephrine first.  Then using standard protocol a series of K wires were placed into the pedicles at T11-T12 and L1 L3 and L4 on both sides.  Once this was completed the placement of the wires was verified radiographically and found to be acceptable.  Next we placed pedicle screws over the K wires and these were placed with EMG monitoring to verify no evidence of cut out or neural irritability.  4.5 x 40 mm screws were placed in T11 5.5 x 40 mm screws were placed in T12-L1 and L2 and 6.5 x 40 mm screws were placed in L4 bilaterally.  All the monitoring revealed no evidence of any cut out at any of the levels.  None of the screws had to be redirected.  Was noted that during the placement of the screws the bone itself was quite soft.  With the screws being placed we then did methacrylate augmentation with 1 cc of methacrylate placed into the barrel of each fenestrated screw this was all done with radiographic confirmation using fluoroscopic imaging.  Once all the cement had been placed and hardened the towers were removed and Bandini was used to create a surgical construct with the rod being bent to the appropriate proportions for a neutral fixation.  The rods were then placed left and right and tightened in a neutral fashion.  Once the towers were removed final radiographs were obtained in AP and lateral projection.  This felt that the fixation was  in good position.  Next a midline incision was created over the region of L1-L3 and this was carried down on either side of the lamina in a subperiosteal fashion self-retaining retractor was placed and a laminectomy was created removing the laminar arch of L2  out to and including the mesial walls of the facets on the right side the lateral portion of bone was noted to be loose as it was part of a burst type fracture the area was decorticated and once adequately decorticated on both sides we made sure that an ample laminectomy was completed this included undercutting L1 adequately and undercutting L3 the common dural tube was left intact and was noted to be well decompressed throughout this passageway.  Once that was completed the bone that had been harvested from the laminectomy was decorticated and cut into small fragments through the middle and then placed into the lateral gutters between L1 and L3 to create a posterior lateral and interbody fusion this was done with the help of Dr. Charlane Ferretti who allowed for adequate retraction well the bone graft was being packed into these lateral gutters once this was all completed we then proceeded with closure placing #1 Vicryl in interrupted fashion and the lumbodorsal fascia 2-0 Vicryl subcutaneous tissues and 3-0 Vicryl subcuticularly total blood loss for the procedure was estimated 200 cc patient tolerated procedure well is returned to recovery room in stable condition.

## 2019-10-18 NOTE — Progress Notes (Signed)
Orthopedic Tech Progress Note Patient Details:  Morgan Bell 12-11-1950 [id number]  Patient ID: Morgan Bell, female   DOB: 02/22/1951, 69 y.o.   MRN: [id number] Delivered lso to pt.  Karolee Stamps 10/18/2019, 2:37 PM

## 2019-10-18 NOTE — Anesthesia Procedure Notes (Signed)
Procedure Name: Intubation Date/Time: 10/18/2019 8:45 AM Performed by: Trinna Post., CRNA Pre-anesthesia Checklist: Patient identified, Emergency Drugs available, Suction available, Patient being monitored and Timeout performed Patient Re-evaluated:Patient Re-evaluated prior to induction Oxygen Delivery Method: Circle system utilized Preoxygenation: Pre-oxygenation with 100% oxygen Induction Type: IV induction and Rapid sequence Laryngoscope Size: Mac and 3 Grade View: Grade I Tube type: Oral Tube size: 7.0 mm Number of attempts: 1 Airway Equipment and Method: Stylet Placement Confirmation: ETT inserted through vocal cords under direct vision,  positive ETCO2 and breath sounds checked- equal and bilateral Secured at: 22 cm Tube secured with: Tape Dental Injury: Teeth and Oropharynx as per pre-operative assessment

## 2019-10-18 NOTE — Progress Notes (Signed)
CSW acknowledges consult for SNF placement. Will complete workup once PT evaluation is in.   Percell Locus Pecola Haxton LCSW [phone]

## 2019-10-18 NOTE — Anesthesia Postprocedure Evaluation (Signed)
Anesthesia Post Note  Patient: Morgan Bell  Procedure(s) Performed: Lumbar Two-Three Laminectomy with Pedicle Screw Fixation Thoracic Eleven to Lumbar Four with Percutaneous Augmented Screws and Posterior Fusion with Mazor Guidance (N/A Spine Lumbar) APPLICATION OF ROBOTIC ASSISTANCE FOR SPINAL PROCEDURE (N/A )     Patient location during evaluation: PACU Anesthesia Type: General Level of consciousness: awake and alert Pain management: pain level controlled Vital Signs Assessment: post-procedure vital signs reviewed and stable Respiratory status: spontaneous breathing, nonlabored ventilation, respiratory function stable and patient connected to nasal cannula oxygen Cardiovascular status: blood pressure returned to baseline and stable Postop Assessment: no apparent nausea or vomiting Anesthetic complications: no    Last Vitals:  Vitals:   10/18/19 1350 10/18/19 1415  BP: 116/77 121/74  Pulse: 82 84  Resp: 20 14  Temp:  36.4 C  SpO2: 97% 96%    Last Pain:  Vitals:   10/18/19 1415  TempSrc: Oral  PainSc:                  Tiajuana Amass

## 2019-10-18 NOTE — H&P (Signed)
Morgan Bell is an 69 y.o. female.   Chief Complaint: Severe back pain leg weakness HPI: Morgan Bell is a 69 year old individual who had sustained a traumatic fracture of the L2 vertebrae.  She underwent an acrylic balloon kyphoplasty however she subsequently developed worsening back pain and some modest leg weakness is noted that she had a retropulsed fragment further examination of the fracture revealed this to be an actual burst fracture she has developed significant spinal stenosis at L2-L3 and after careful consideration of her options I advised that she should undergo posterior decompression with stabilization from T10-L4.  Because she has significant history of osteoporosis I advised that the screws should be augmented in order to gain better fixation.  She is now admitted for this procedure.  Past Medical History:  Diagnosis Date  . Arthritis    RA  . Exertional angina ([place]) 07/16/2018   CT coronary 09/13/18: Coronary Ca score 62.1, 68% for age/sex matched control, No evidence of obstructive CAD (1-24% LAD, RCA)  . Family history of adverse reaction to anesthesia    son has difficulty waking   . Headache(784.0)    HX MIGRAINES   . Hypertension   . Melanoma ([place])   . Obesity   . Stroke [facility])    MINI STROKE     21 YRS AGO     Past Surgical History:  Procedure Laterality Date  . CESAREAN SECTION  11/26/1973  . CESAREAN SECTION  09/22/1975  . CESAREAN SECTION  04/30/1978  . HEMORRHOID SURGERY  06/1987  . MELANOMA EXCISION WITH SENTINEL LYMPH NODE BIOPSY Right 03/02/2013   Procedure: Wide MELANOMA EXCISION Right Abdominal wall WITH SENTINEL LYMPH NODE mapping Right Axilla;  Surgeon: Merrie Roof, MD;  Location: [place];  Service: General;  Laterality: Right;  . NM [place]  2003   negative Bruce protocol exercise stress test with no evidence of perfusion abnormality, EF 66%  . TOTAL HIP ARTHROPLASTY Right 03/2011    Family History  Problem Relation Age of Onset  .  Alzheimer's disease Mother   . Heart disease Father        MI, died at 36  . Alzheimer's disease Maternal Grandmother   . Cancer Maternal Grandfather        lung  . Heart attack Paternal Grandfather   . Tuberculosis Sister    Social History:  reports that she has never smoked. She has never used smokeless tobacco. She reports that she does not drink alcohol or use drugs.  Allergies:  Allergies  Allergen Reactions  . Sulfa Antibiotics Rash    SULFA DRUGS    Medications Prior to Admission  Medication Sig Dispense Refill  . amLODipine (NORVASC) 10 MG tablet TAKE 1/2 TABLET BY MOUTH TWICE DAILY (Patient taking differently: Take 10 mg by mouth daily. ) 30 tablet 10  . aspirin EC 81 MG tablet Take 81 mg daily by mouth.    . cetirizine (ZYRTEC) 10 MG tablet Take 10 mg by mouth daily.    . fenofibrate (TRICOR) 145 MG tablet TAKE 1 TABLET BY MOUTH DAILY (Patient taking differently: Take 145 mg by mouth daily. ) 30 tablet 6  . Ferrous Sulfate (IRON SLOW RELEASE) 143 (45 Fe) MG TBCR Take 1 tablet daily by mouth.    . gabapentin (NEURONTIN) 100 MG capsule Take 100 mg by mouth 4 (four) times daily.    Marland Kitchen HYDROcodone-acetaminophen (NORCO/VICODIN) 5-325 MG tablet Take 1 tablet by mouth 2 (two) times daily as needed  for pain.    Marland Kitchen irbesartan (AVAPRO) 300 MG tablet TAKE 1 TABLET BY MOUTH ONE TIME DAILY (Patient taking differently: Take 300 mg by mouth daily. ) 30 tablet 7  . leflunomide (ARAVA) 20 MG tablet Take 20 mg daily by mouth.    . meloxicam (MOBIC) 15 MG tablet Take 15 mg by mouth daily as needed for pain.     . Multiple Vitamin (MULTIVITAMIN WITH MINERALS) TABS Take 6 tablets by mouth daily. SmartyPants vitamins    . Potassium Chloride ER 20 MEQ TBCR Take 1 tablet by mouth 2 (two) times daily. 60 tablet 0  . potassium chloride SA (K-DUR,KLOR-CON) 20 MEQ tablet Take 2 tablets (40 mEq total) by mouth daily. 60 tablet 1  . rosuvastatin (CRESTOR) 40 MG tablet TAKE 1 TABLET BY MOUTH EVERY DAY 30  tablet 5  . amoxicillin (AMOXIL) 500 MG tablet Take 2,000 mg by mouth See admin instructions. Take 4 capsules (2000 mg) by mouth 1 hour prior to dental appointments      No results found for this or any previous visit (from the past 48 hour(s)). No results found.  Review of Systems  Constitutional: Negative.   HENT: Negative.   Eyes: Negative.   Respiratory: Negative.   Cardiovascular: Negative.   Gastrointestinal: Negative.   Endocrine: Negative.   Genitourinary: Negative.   Musculoskeletal: Positive for back pain and gait problem.  Skin: Negative.   Allergic/Immunologic: Negative.   Neurological: Positive for weakness.  Hematological: Negative.   Psychiatric/Behavioral: Negative.     Blood pressure 134/79, pulse 79, temperature 98 F (36.7 C), temperature source Oral, resp. rate 18, height 5\' 1"  (1.549 m), weight 54.4 kg, SpO2 97 %. Physical Exam  Constitutional: She is oriented to person, place, and time. She appears well-developed and well-nourished.  HENT:  Head: Normocephalic and atraumatic.  Eyes: Pupils are equal, round, and reactive to light. Conjunctivae and EOM are normal.  Cardiovascular: Normal rate and regular rhythm.  Respiratory: Effort normal and breath sounds normal.  GI: Soft. Bowel sounds are normal.  Musculoskeletal:     Cervical back: Normal range of motion and neck supple.  Neurological: She is alert and oriented to person, place, and time. She has normal reflexes.  Skin: Skin is warm and dry.  Psychiatric: She has a normal mood and affect. Her behavior is normal. Judgment and thought content normal.     Assessment/Plan History of L2 burst fracture with severe stenosis and early cauda equina syndrome.  Plan posterior decompression and stabilization from T10-L4.  Robotic assistance.  Earleen Newport, MD 10/18/2019, 8:15 AM

## 2019-10-18 NOTE — Transfer of Care (Signed)
Immediate Anesthesia Transfer of Care Note  Patient: Morgan Bell  Procedure(s) Performed: Lumbar Two-Three Laminectomy with Pedicle Screw Fixation Thoracic Eleven to Lumbar Four with Percutaneous Augmented Screws and Posterior Fusion with Mazor Guidance (N/A Spine Lumbar) APPLICATION OF ROBOTIC ASSISTANCE FOR SPINAL PROCEDURE (N/A )  Patient Location: PACU  Anesthesia Type:General  Level of Consciousness: awake, alert , oriented and drowsy  Airway & Oxygen Therapy: Patient Spontanous Breathing and Patient connected to nasal cannula oxygen  Post-op Assessment: Report given to RN and Post -op Vital signs reviewed and stable  Post vital signs: Reviewed and stable  Last Vitals:  Vitals Value Taken Time  BP 133/59 10/18/19 1251  Temp    Pulse 84 10/18/19 1253  Resp 12 10/18/19 1253  SpO2 98 % 10/18/19 1253  Vitals shown include unvalidated device data.  Last Pain:  Vitals:   10/18/19 0630  TempSrc: Oral  PainSc:       Patients Stated Pain Goal: 4 ([id number])  Complications: No apparent anesthesia complications

## 2019-10-18 NOTE — Anesthesia Procedure Notes (Signed)
Arterial Line Insertion Start/End1/02/2020 7:15 AM Performed by: Trinna Post., CRNA, CRNA  Patient location: Pre-op. Preanesthetic checklist: patient identified, IV checked, site marked, risks and benefits discussed, surgical consent, monitors and equipment checked, pre-op evaluation, timeout performed and anesthesia consent Lidocaine 1% used for infiltration Right, radial was placed Catheter size: 20 G Hand hygiene performed  and maximum sterile barriers used   Attempts: 1 Procedure performed without using ultrasound guided technique. Following insertion, dressing applied and Biopatch. Post procedure assessment: normal  Patient tolerated the procedure well with no immediate complications.

## 2019-10-19 ENCOUNTER — Encounter: Payer: Self-pay | Admitting: *Deleted

## 2019-10-19 LAB — CBC
HCT: 30 % — ABNORMAL LOW (ref 36.0–46.0)
Hemoglobin: 9.7 g/dL — ABNORMAL LOW (ref 12.0–15.0)
MCH: 30.2 pg (ref 26.0–34.0)
MCHC: 32.3 g/dL (ref 30.0–36.0)
MCV: 93.5 fL (ref 80.0–100.0)
Platelets: 124 10*3/uL — ABNORMAL LOW (ref 150–400)
RBC: 3.21 MIL/uL — ABNORMAL LOW (ref 3.87–5.11)
RDW: 16 % — ABNORMAL HIGH (ref 11.5–15.5)
WBC: 11.5 10*3/uL — ABNORMAL HIGH (ref 4.0–10.5)
nRBC: 0 % (ref 0.0–0.2)

## 2019-10-19 LAB — BASIC METABOLIC PANEL
Anion gap: 13 (ref 5–15)
BUN: 29 mg/dL — ABNORMAL HIGH (ref 8–23)
CO2: 22 mmol/L (ref 22–32)
Calcium: 8.9 mg/dL (ref 8.9–10.3)
Chloride: 104 mmol/L (ref 98–111)
Creatinine, Ser: 1.13 mg/dL — ABNORMAL HIGH (ref 0.44–1.00)
GFR calc Af Amer: 57 mL/min — ABNORMAL LOW (ref >60)
GFR calc non Af Amer: 49 mL/min — ABNORMAL LOW (ref >60)
Glucose, Bld: 103 mg/dL — ABNORMAL HIGH (ref 70–99)
Potassium: 4.6 mmol/L (ref 3.5–5.1)
Sodium: 139 mmol/L (ref 135–145)

## 2019-10-19 LAB — SARS CORONAVIRUS 2 (TAT 6-24 HRS): SARS Coronavirus 2: NEGATIVE

## 2019-10-19 MED FILL — Thrombin For Soln 5000 Unit: CUTANEOUS | Qty: 5000 | Status: AC

## 2019-10-19 NOTE — Progress Notes (Signed)
Patient ID: Morgan Bell, female   DOB: 17-Apr-1951, 69 y.o.   MRN: [id number] Vital signs are stable Patient is awake and alert moving legs quite well She notes that the pain in the lower extremities is essentially gone Her back is somewhat sore but she is ambulatory She does live alone would like to live independently She might benefit from a short course of inpatient rehabilitation We will place rehab consult

## 2019-10-19 NOTE — Evaluation (Signed)
Physical Therapy Evaluation Patient Details Name: Morgan Bell MRN: [id number] DOB: [birth year] Today's Date: 10/19/2019   History of Present Illness  Pt is a 69 y.o. F with significant PMH of RA, HTN, stroke, and traumatic fracture of L2 vertebrae s/p acrylic balloon kyphoplasty. Pt developed worsening back pain and further examination revealed burst fracture and significant stenosis at L2-3. Now s/p posterior decompression with stabilization from T10-L4 10/17/2018.  Clinical Impression  Pt admitted s/p procedure listed above. 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 Unfortunately, she has been increasingly debilitated due to pain and has required increased assist for mobility and ADL's. On PT evaluation, pt presents with weakness, balance impairments, gait abnormalities, pain, and decreased endurance. Pt denying radicular pain into bilateral extremities or numbness. Ambulating limited hallway distances with a walker at a min assist level. Would benefit from CIR to maximize functional independence and return to a modified independent level to return to living alone.     Follow Up Recommendations CIR    Equipment Recommendations  None recommended by PT    Recommendations for Other Services Rehab consult     Precautions / Restrictions Precautions Precautions: Fall;Back Precaution Booklet Issued: Yes (comment) Precaution Comments: verbally reviewed and provided written handout Required Braces or Orthoses: Spinal Brace Spinal Brace: Lumbar corset;Applied in sitting position Restrictions Weight Bearing Restrictions: No      Mobility  Bed Mobility Overal bed mobility: Modified Independent             General bed mobility comments: cues for log roll technique, HOB elevated  Transfers Overall transfer level: Needs assistance Equipment used: Rolling walker (2 wheeled) Transfers: Sit to/from Stand Sit to Stand: Min guard         General  transfer comment: cues for hand placement  Ambulation/Gait Ambulation/Gait assistance: Min assist Gait Distance (Feet): 100 Feet(100', 100', 100') Assistive device: Rolling walker (2 wheeled) Gait Pattern/deviations: Step-through pattern;Decreased stride length;Trendelenburg;Decreased dorsiflexion - right;Decreased dorsiflexion - left Gait velocity: decreased   General Gait Details: Pt with Trendelenberg gait pattern which worsened with increased distance, requires several standing rest breaks, fatigues easily, cues for activity pacing  Stairs            Wheelchair Mobility    Modified Rankin (Stroke Patients Only)       Balance Overall balance assessment: Needs assistance Sitting-balance support: Feet supported Sitting balance-Leahy Scale: Good     Standing balance support: Bilateral upper extremity supported Standing balance-Leahy Scale: Poor Standing balance comment: reliant on external support                             Pertinent Vitals/Pain Pain Assessment: Faces Faces Pain Scale: Hurts even more Pain Location: back Pain Descriptors / Indicators: Operative site guarding;Grimacing Pain Intervention(s): Monitored during session    Home Living Family/patient expects to be discharged to:: Private residence Living Arrangements: Alone Available Help at Discharge: Family;Available PRN/intermittently Type of Home: House Home Access: Stairs to enter Entrance Stairs-Rails: Psychiatric nurse of Steps: 5 Home Layout: One level Home Equipment: Toilet riser;Walker - 2 wheels      Prior Function Level of Independence: Needs assistance   Gait / Transfers Assistance Needed: Limited household ambulator with walker, having to use bed pan at times due to pain  ADL's / Homemaking Assistance Needed: Reports sponge bathing        Hand Dominance        Extremity/Trunk  Assessment   Upper Extremity Assessment Upper Extremity Assessment:  Defer to OT evaluation    Lower Extremity Assessment Lower Extremity Assessment: RLE deficits/detail;LLE deficits/detail RLE Deficits / Details: Grossly 4/5 LLE Deficits / Details: Grossly 4/5    Cervical / Trunk Assessment Cervical / Trunk Assessment: Other exceptions Cervical / Trunk Exceptions: s/p spinal sx  Communication   Communication: No difficulties  Cognition Arousal/Alertness: Awake/alert Behavior During Therapy: WFL for tasks assessed/performed Overall Cognitive Status: Within Functional Limits for tasks assessed                                        General Comments      Exercises     Assessment/Plan    PT Assessment Patient needs continued PT services  PT Problem List Decreased strength;Decreased activity tolerance;Decreased balance;Decreased mobility;Pain       PT Treatment Interventions DME instruction;Gait training;Stair training;Functional mobility training;Therapeutic activities;Therapeutic exercise;Balance training;Patient/family education    PT Goals (Current goals can be found in the Care Plan section)  Acute Rehab PT Goals Patient Stated Goal: "be more independent." PT Goal Formulation: With patient Time For Goal Achievement: 11/02/19 Potential to Achieve Goals: Good    Frequency Min 5X/week   Barriers to discharge Decreased caregiver support      Co-evaluation               AM-PAC PT "6 Clicks" Mobility  Outcome Measure Help needed turning from your back to your side while in a flat bed without using bedrails?: None Help needed moving from lying on your back to sitting on the side of a flat bed without using bedrails?: None Help needed moving to and from a bed to a chair (including a wheelchair)?: A Little Help needed standing up from a chair using your arms (e.g., wheelchair or bedside chair)?: A Little Help needed to walk in hospital room?: A Little Help needed climbing 3-5 steps with a railing? : A Little 6  Click Score: 20    End of Session Equipment Utilized During Treatment: Gait belt;Back brace Activity Tolerance: Patient tolerated treatment well Patient left: in bed;with call bell/phone within reach Nurse Communication: Mobility status PT Visit Diagnosis: Other abnormalities of gait and mobility (R26.89);Unsteadiness on feet (R26.81);Difficulty in walking, not elsewhere classified (R26.2);Pain Pain - part of body: (back)    Time: [id number] PT Time Calculation (min) (ACUTE ONLY): 20 min   Charges:   PT Evaluation $PT Eval Moderate Complexity: 1 Mod          Ellamae Sia, [place], DPT Acute Rehabilitation Services Pager [phone] Office [phone]   Willy Eddy 10/19/2019, 10:04 AM

## 2019-10-19 NOTE — Evaluation (Signed)
Occupational Therapy Evaluation Patient Details Name: Morgan Bell MRN: [id number] DOB: Mar 18, 1951 Today's Date: 10/19/2019    History of Present Illness Pt is a 69 y.o. F with significant PMH of RA, HTN, stroke, and traumatic fracture of L2 vertebrae s/p acrylic balloon kyphoplasty. Pt developed worsening back pain and further examination revealed burst fracture and significant stenosis at L2-3. Now s/p posterior decompression with stabilization from T10-L4 10/17/2018.   Clinical Impression   This 69 y/o female presents with the above. Pt reports just before surgery having increased difficulty performing ADL and mobility tasks due to pain, previously was independent. Pt currently presenting with pain, weakness, decreased mobility status and functional performance. Pt currently requiring minguardA for seated UB ADL, min-modA for LB ADL. Initiated education of back precautions, safety and compensatory techniques for completing ADL/mobility tasks while maintaining precautions. Pt reports she lives alone but has family nearby who can check in. She will benfit from continued acute OT services and recommend post acute therapy services after discharge to maximize her safety and independence with ADL and mobility. Will follow.     Follow Up Recommendations  CIR;Supervision/Assistance - 24 hour    Equipment Recommendations  Other (comment)(defer to next venue)           Precautions / Restrictions Precautions Precautions: Fall;Back Precaution Booklet Issued: Yes (comment) Precaution Comments: verbally reviewed and provided written handout Required Braces or Orthoses: Spinal Brace Spinal Brace: Lumbar corset;Applied in sitting position Restrictions Weight Bearing Restrictions: No      Mobility Bed Mobility Overal bed mobility: Needs Assistance Bed Mobility: Rolling;Sidelying to Sit Rolling: Supervision Sidelying to sit: Supervision       General bed mobility comments: cues for log roll  technique, performing from flat bed with use of bedrail  Transfers Overall transfer level: Needs assistance Equipment used: Rolling walker (2 wheeled) Transfers: Sit to/from Stand Sit to Stand: Min guard         General transfer comment: cues for hand placement    Balance Overall balance assessment: Needs assistance Sitting-balance support: Feet supported Sitting balance-Leahy Scale: Good     Standing balance support: Bilateral upper extremity supported Standing balance-Leahy Scale: Poor Standing balance comment: reliant on external support                           ADL either performed or assessed with clinical judgement   ADL Overall ADL's : Needs assistance/impaired Eating/Feeding: Modified independent;Sitting   Grooming: Set up;Sitting   Upper Body Bathing: Min guard;Sitting Upper Body Bathing Details (indicate cue type and reason): decreased tolerance to sitting for prolonged time due to pain/discomfort Lower Body Bathing: Minimal assistance;Sit to/from stand   Upper Body Dressing : Min guard;Minimal assistance;Sitting   Lower Body Dressing: Minimal assistance;Sit to/from stand Lower Body Dressing Details (indicate cue type and reason): pt easily able to perform figure 4 seated EOB, doing so to adjust socks Toilet Transfer: Minimal assistance;Ambulation;RW   Toileting- Clothing Manipulation and Hygiene: Minimal assistance;Sit to/from stand       Functional mobility during ADLs: Minimal assistance;Rolling walker General ADL Comments: pt with limitations due to pain, weakness                  Pertinent Vitals/Pain Pain Assessment: Faces Faces Pain Scale: Hurts even more Pain Location: back Pain Descriptors / Indicators: Operative site guarding;Grimacing;Discomfort;Pressure Pain Intervention(s): Limited activity within patient's tolerance;Monitored during session;Repositioned     Hand Dominance     Extremity/Trunk Assessment  Upper  Extremity Assessment Upper Extremity Assessment: Generalized weakness   Lower Extremity Assessment Lower Extremity Assessment: Defer to PT evaluation RLE Deficits / Details: Grossly 4/5 LLE Deficits / Details: Grossly 4/5   Cervical / Trunk Assessment Cervical / Trunk Assessment: Other exceptions Cervical / Trunk Exceptions: s/p spinal sx   Communication Communication Communication: No difficulties   Cognition Arousal/Alertness: Awake/alert Behavior During Therapy: WFL for tasks assessed/performed Overall Cognitive Status: Within Functional Limits for tasks assessed                                     General Comments       Exercises     Shoulder Instructions      Home Living Family/patient expects to be discharged to:: Private residence Living Arrangements: Alone Available Help at Discharge: Family;Available PRN/intermittently Type of Home: House Home Access: Stairs to enter [employer] of Steps: 5 Entrance Stairs-Rails: Right;Left Home Layout: One level     Bathroom Shower/Tub: Tub/shower unit         Home Equipment: Toilet riser;Walker - 2 wheels;Bedside commode          Prior Functioning/Environment Level of Independence: Needs assistance  Gait / Transfers Assistance Needed: Limited household ambulator with walker, having to use bed pan at times due to pain ADL's / Homemaking Assistance Needed: Reports sponge bathing recently, reports previously independent prior to pain becoming a limitation             OT Problem List: Decreased strength;Decreased range of motion;Decreased activity tolerance;Impaired balance (sitting and/or standing);Decreased safety awareness;Decreased knowledge of use of DME or AE;Decreased knowledge of precautions;Pain      OT Treatment/Interventions: Self-care/ADL training;Therapeutic exercise;Energy conservation;DME and/or AE instruction;Therapeutic activities;Patient/family education;Balance training     OT Goals(Current goals can be found in the care plan section) Acute Rehab OT Goals Patient Stated Goal: "be more independent." OT Goal Formulation: With patient Time For Goal Achievement: 11/02/19 Potential to Achieve Goals: Good  OT Frequency: Min 2X/week   Barriers to D/C:            Co-evaluation              AM-PAC OT "6 Clicks" Daily Activity     Outcome Measure Help from another person eating meals?: None Help from another person taking care of personal grooming?: A Little Help from another person toileting, which includes using toliet, bedpan, or urinal?: A Little Help from another person bathing (including washing, rinsing, drying)?: A Lot Help from another person to put on and taking off regular upper body clothing?: A Little Help from another person to put on and taking off regular lower body clothing?: A Lot 6 Click Score: 17   End of Session Equipment Utilized During Treatment: Back brace Nurse Communication: Mobility status  Activity Tolerance: Patient tolerated treatment well Patient left: in bed;with call bell/phone within reach  OT Visit Diagnosis: Other abnormalities of gait and mobility (R26.89);Muscle weakness (generalized) (M62.81);Pain Pain - part of body: (pain)                Time: [id number] OT Time Calculation (min): 15 min Charges:  OT General Charges $OT Visit: 1 Visit OT Evaluation $OT Eval Low Complexity: 1 Low  Lou Cal, OT Supplemental Rehabilitation Services Pager [phone] Office [phone]  Raymondo Band 10/19/2019, 11:47 AM

## 2019-10-19 NOTE — NC FL2 (Signed)
[place] LEVEL OF CARE SCREENING TOOL     IDENTIFICATION  Patient Name: Morgan Bell Birthdate: [birth year] Sex: female Admission Date (Current Location): 10/18/2019  [facility] and [place] Number:  Herbalist and Address:  [facility] [place], [place] [address] [place], [place] [address]      Provider Number: [id number]  Attending Physician Name and Address:  Kristeen Miss, MD  Relative Name and Phone Number:  Collier Salina, son, [phone]    Current Level of Care: Hospital Recommended Level of Care: [place] Prior Approval Number:    Date Approved/Denied:   PASRR Number: [id number]  Discharge Plan: SNF    Current Diagnoses: Patient Active Problem List   Diagnosis Date Noted  . Lumbar stenosis with neurogenic claudication 10/18/2019  . Exertional angina (HCC) 07/16/2018  . Dyslipidemia 10/24/2016  . TIA (transient ischemic attack) 01/02/2015  . Rheumatoid arthritis ([place]) 01/02/2015  . HTN (hypertension) 04/10/2014  . Melanoma of trunk ([place]) 01/31/2013    Orientation RESPIRATION BLADDER Height & Weight     Self, Time, Situation, Place  Normal Continent Weight: 120 lb (54.4 kg) Height:  5\' 1"  (154.9 cm)  BEHAVIORAL SYMPTOMS/MOOD NEUROLOGICAL BOWEL NUTRITION STATUS      Continent Diet(Please see DC Summary)  AMBULATORY STATUS COMMUNICATION OF NEEDS Skin   Limited Assist Verbally Surgical wounds(Closed incision on back)                       Personal Care Assistance Level of Assistance  Bathing, Feeding, Dressing Bathing Assistance: Limited assistance Feeding assistance: Independent Dressing Assistance: Limited assistance     Functional Limitations Info  Sight, Hearing, Speech Sight Info: Adequate Hearing Info: Adequate Speech Info: Adequate    SPECIAL CARE FACTORS FREQUENCY  PT (By licensed PT), OT (By licensed OT)     PT Frequency: 5x5 OT Frequency: 5x            Contractures Contractures  Info: Not present    Additional Factors Info  Code Status, Allergies Code Status Info: Full Allergies Info: Sulfa Antibiotics           Current Medications (10/19/2019):  This is the current hospital active medication list Current Facility-Administered Medications  Medication Dose Route Frequency Provider Last Rate Last Admin  . acetaminophen (TYLENOL) tablet 650 mg  650 mg Oral Q4H PRN Kristeen Miss, MD       Or  . acetaminophen (TYLENOL) suppository 650 mg  650 mg Rectal Q4H PRN Kristeen Miss, MD      . alum & mag hydroxide-simeth (MAALOX/MYLANTA) 200-200-20 MG/5ML suspension 30 mL  30 mL Oral Q6H PRN Kristeen Miss, MD      . amLODipine (NORVASC) tablet 10 mg  10 mg Oral Daily Kristeen Miss, MD   10 mg at 10/19/19 1019  . bisacodyl (DULCOLAX) suppository 10 mg  10 mg Rectal Daily PRN Kristeen Miss, MD      . docusate sodium (COLACE) capsule 100 mg  100 mg Oral BID Kristeen Miss, MD   100 mg at 10/19/19 1019  . fenofibrate tablet 160 mg  160 mg Oral Daily Kristeen Miss, MD   160 mg at 10/19/19 1019  . ferrous sulfate tablet   Oral Daily Kristeen Miss, MD   325 mg at 10/19/19 1019  . gabapentin (NEURONTIN) capsule 100 mg  100 mg Oral QID Kristeen Miss, MD   100 mg at 10/19/19 1019  . HYDROcodone-acetaminophen (NORCO/VICODIN) 5-325 MG per tablet 1-2  tablet  1-2 tablet Oral Q3H PRN Kristeen Miss, MD   2 tablet at 10/19/19 1019  . irbesartan (AVAPRO) tablet 300 mg  300 mg Oral Daily Kristeen Miss, MD   300 mg at 10/19/19 1018  . lactated ringers infusion   Intravenous Continuous Kristeen Miss, MD      . leflunomide (ARAVA) tablet 20 mg  20 mg Oral Daily Kristeen Miss, MD   20 mg at 10/19/19 1018  . loratadine (CLARITIN) tablet 10 mg  10 mg Oral Daily Kristeen Miss, MD   10 mg at 10/19/19 1019  . menthol-cetylpyridinium (CEPACOL) lozenge 3 mg  1 lozenge Oral PRN Kristeen Miss, MD       Or  . phenol (CHLORASEPTIC) mouth spray 1 spray  1 spray Mouth/Throat PRN Kristeen Miss, MD      .  methocarbamol (ROBAXIN) tablet 500 mg  500 mg Oral Q6H PRN Kristeen Miss, MD   500 mg at 10/19/19 0536   Or  . methocarbamol (ROBAXIN) 500 mg in dextrose 5 % 50 mL IVPB  500 mg Intravenous Q6H PRN Kristeen Miss, MD      . morphine 2 MG/ML injection 2-4 mg  2-4 mg Intravenous Q2H PRN Kristeen Miss, MD   2 mg at 10/18/19 1452  . ondansetron (ZOFRAN) tablet 4 mg  4 mg Oral Q6H PRN Kristeen Miss, MD       Or  . ondansetron (ZOFRAN) injection 4 mg  4 mg Intravenous Q6H PRN Kristeen Miss, MD      . polyethylene glycol (MIRALAX / GLYCOLAX) packet 17 g  17 g Oral Daily PRN Kristeen Miss, MD      . potassium chloride SA (KLOR-CON) CR tablet 40 mEq  40 mEq Oral Daily Kristeen Miss, MD   40 mEq at 10/19/19 1019  . rosuvastatin (CRESTOR) tablet 40 mg  40 mg Oral Daily Kristeen Miss, MD   40 mg at 10/19/19 1019  . senna (SENOKOT) tablet 8.6 mg  1 tablet Oral BID Kristeen Miss, MD   8.6 mg at 10/19/19 1019  . sodium chloride flush (NS) 0.9 % injection 3 mL  3 mL Intravenous Austin Miles, MD   3 mL at 10/18/19 2136  . sodium chloride flush (NS) 0.9 % injection 3 mL  3 mL Intravenous PRN Kristeen Miss, MD      . sodium phosphate (FLEET) 7-19 GM/118ML enema 1 enema  1 enema Rectal Once PRN Kristeen Miss, MD         Discharge Medications: Please see discharge summary for a list of discharge medications.  Relevant Imaging Results:  Relevant Lab Results:   Additional Information SSN: [id number]          COVID negative on 10/15/19  Benard Halsted, LCSW

## 2019-10-19 NOTE — Progress Notes (Signed)
Inpatient Rehabilitation-Admissions Coordinator   CIR consult received. Met with pt bedside for rehab assessment. Feel pt would benefit from post acute rehab in IP Rehab, however given current functional level, anticipate pt will only need 1 week in CIR at the most. Pt reports her house is under renovation and she most likely will not be able to return home until >2 weeks from now. Per pt, she does not have another place to stay until her house is ready. Given that pt's home may not be accessable until >2 weeks from now, pt's rehab needs will best be served in a SNF to allow her a longer rehab stay until her home is ready. Pt is aware of SNF recommendation and is open to placement. AC has contacted [facility] team regarding recommendation.   AC will sign off.   Please call if questions.

## 2019-10-19 NOTE — TOC Initial Note (Signed)
Transition of Care [facility]) - Initial/Assessment Note    Patient Details  Name: Morgan Bell MRN: [id number] Date of Birth: 05/27/1951  Transition of [facility] CM/SW Contact:    Benard Halsted, LCSW Phone Number: 10/19/2019, 12:50 PM  Clinical Narrative:                 CSW received consult for possible SNF placement at time of discharge. CSW spoke with patient regarding PT recommendation of SNF placement at time of discharge since CIR is unable to accept patient. Patient reported that patient's family is currently unable to care for patient at home given patient's current physical needs. Patient expressed understanding of PT recommendation and is agreeable to SNF placement at time of discharge. CSW discussed insurance authorization process and provided Medicare SNF ratings list. She reported preference for [facility]. They are abe to accept patient tomorrow if medically ready. CSW requested updated COVID test from RN and sent clinicals to insurance for review. Patient expressed being hopeful for rehab and to feel better soon. No further questions reported at this time. CSW to continue to follow and assist with discharge planning needs.   Expected Discharge Plan: Skilled Nursing Facility Barriers to Discharge: Continued Medical Work up, Ship broker   Patient Goals and CMS Choice Patient states their goals for this hospitalization and ongoing recovery are:: Rehab CMS Medicare.gov Compare Post Acute Care list provided to:: Patient Choice offered to / list presented to : Patient  Expected Discharge Plan and Services Expected Discharge Plan: [place] In-house Referral: Clinical Social Work   Post Acute Care Choice: [place] Living arrangements for the past 2 months: Apartment                                      Prior Living Arrangements/Services Living arrangements for the past 2 months: Apartment Lives with::  Self Patient language and need for interpreter reviewed:: Yes Do you feel safe going back to the place where you live?: Yes      Need for Family Participation in Patient Care: No (Comment) Care giver support system in place?: Yes (comment)   Criminal Activity/Legal Involvement Pertinent to Current Situation/Hospitalization: No - Comment as needed  Activities of Daily Living      Permission Sought/Granted Permission sought to share information with : Facility Art therapist granted to share information with : Yes, Verbal Permission Granted     Permission granted to share info w AGENCY: SNFs        Emotional Assessment Appearance:: Appears stated age Attitude/Demeanor/Rapport: Engaged Affect (typically observed): Accepting, Pleasant, Appropriate Orientation: : Oriented to Situation, Oriented to  Time, Oriented to Place, Oriented to Self Alcohol / Substance Use: Not Applicable Psych Involvement: No (comment)  Admission diagnosis:  Lumbar stenosis with neurogenic claudication [M48.062] Patient Active Problem List   Diagnosis Date Noted  . Lumbar stenosis with neurogenic claudication 10/18/2019  . Exertional angina (HCC) 07/16/2018  . Dyslipidemia 10/24/2016  . TIA (transient ischemic attack) 01/02/2015  . Rheumatoid arthritis ([place]) 01/02/2015  . HTN (hypertension) 04/10/2014  . Melanoma of trunk ([place]) 01/31/2013   PCP:  Jearld Fenton, NP Pharmacy:   [facility] [place], [place] [address] [address] Phone: [phone] Fax: [phone]     Social Determinants  of Health (SDOH) Interventions    Readmission Risk Interventions No flowsheet data found.  

## 2019-10-20 DIAGNOSIS — C4359 Malignant melanoma of other part of trunk: Secondary | ICD-10-CM | POA: Diagnosis not present

## 2019-10-20 DIAGNOSIS — Z4789 Encounter for other orthopedic aftercare: Secondary | ICD-10-CM | POA: Diagnosis not present

## 2019-10-20 DIAGNOSIS — G459 Transient cerebral ischemic attack, unspecified: Secondary | ICD-10-CM | POA: Diagnosis not present

## 2019-10-20 DIAGNOSIS — E6609 Other obesity due to excess calories: Secondary | ICD-10-CM | POA: Diagnosis not present

## 2019-10-20 DIAGNOSIS — S32020G Wedge compression fracture of second lumbar vertebra, subsequent encounter for fracture with delayed healing: Secondary | ICD-10-CM | POA: Diagnosis not present

## 2019-10-20 DIAGNOSIS — I10 Essential (primary) hypertension: Secondary | ICD-10-CM | POA: Diagnosis not present

## 2019-10-20 DIAGNOSIS — M810 Age-related osteoporosis without current pathological fracture: Secondary | ICD-10-CM | POA: Diagnosis not present

## 2019-10-20 DIAGNOSIS — Z7401 Bed confinement status: Secondary | ICD-10-CM | POA: Diagnosis not present

## 2019-10-20 DIAGNOSIS — R52 Pain, unspecified: Secondary | ICD-10-CM | POA: Diagnosis not present

## 2019-10-20 DIAGNOSIS — R2681 Unsteadiness on feet: Secondary | ICD-10-CM | POA: Diagnosis not present

## 2019-10-20 DIAGNOSIS — G43909 Migraine, unspecified, not intractable, without status migrainosus: Secondary | ICD-10-CM | POA: Diagnosis not present

## 2019-10-20 DIAGNOSIS — M069 Rheumatoid arthritis, unspecified: Secondary | ICD-10-CM | POA: Diagnosis not present

## 2019-10-20 DIAGNOSIS — Z5189 Encounter for other specified aftercare: Secondary | ICD-10-CM | POA: Diagnosis not present

## 2019-10-20 DIAGNOSIS — E46 Unspecified protein-calorie malnutrition: Secondary | ICD-10-CM | POA: Diagnosis not present

## 2019-10-20 DIAGNOSIS — I499 Cardiac arrhythmia, unspecified: Secondary | ICD-10-CM | POA: Diagnosis not present

## 2019-10-20 DIAGNOSIS — M961 Postlaminectomy syndrome, not elsewhere classified: Secondary | ICD-10-CM | POA: Diagnosis not present

## 2019-10-20 DIAGNOSIS — M48062 Spinal stenosis, lumbar region with neurogenic claudication: Secondary | ICD-10-CM | POA: Diagnosis not present

## 2019-10-20 DIAGNOSIS — M2550 Pain in unspecified joint: Secondary | ICD-10-CM | POA: Diagnosis not present

## 2019-10-20 DIAGNOSIS — I2510 Atherosclerotic heart disease of native coronary artery without angina pectoris: Secondary | ICD-10-CM | POA: Diagnosis not present

## 2019-10-20 MED ORDER — HYDROCODONE-ACETAMINOPHEN 5-325 MG PO TABS: 1.0000 | ORAL_TABLET | ORAL | 0 refills | Status: DC | PRN

## 2019-10-20 MED ORDER — METHOCARBAMOL 500 MG PO TABS: 500.0000 mg | ORAL_TABLET | Freq: Four times a day (QID) | ORAL | 3 refills | Status: DC | PRN

## 2019-10-20 NOTE — Progress Notes (Signed)
Physical Therapy Treatment Patient Details Name: Morgan Bell MRN: [id number] DOB: Jan 06, 1951 Today's Date: 10/20/2019    History of Present Illness Pt is a 69 y.o. F with significant PMH of RA, HTN, stroke, and traumatic fracture of L2 vertebrae s/p acrylic balloon kyphoplasty. Pt developed worsening back pain and further examination revealed burst fracture and significant stenosis at L2-3. Now s/p posterior decompression with stabilization from T10-L4 10/17/2018.    PT Comments    Pt seen for additional session after receiving pain medication. Demonstrates improved activity tolerance; ambulating 100 feet with a walker at a min assist level. Continues with balance impairments, decreased endurance, post surgical pain, and gait abnormalities. Will benefit from SNF in light of deficits to maximize functional independence.     Follow Up Recommendations  SNF     Equipment Recommendations  None recommended by PT    Recommendations for Other Services       Precautions / Restrictions Precautions Precautions: Fall;Back Precaution Booklet Issued: Yes (comment) Precaution Comments: verbally reviewed and provided written handout Required Braces or Orthoses: Spinal Brace Spinal Brace: Lumbar corset;Applied in sitting position Restrictions Weight Bearing Restrictions: No    Mobility  Bed Mobility Overal bed mobility: Needs Assistance Bed Mobility: Rolling;Sidelying to Sit;Sit to Sidelying Rolling: Supervision Sidelying to sit: Min assist     Sit to sidelying: Supervision General bed mobility comments: Cues for log roll technique, heavy use of bed rail, increased time/effort to rise. MinA to facilitate trunk elevation to upright due to pain  Transfers Overall transfer level: Needs assistance Equipment used: Rolling walker (2 wheeled) Transfers: Sit to/from Stand Sit to Stand: Min guard         General transfer comment: cues for hand placement  Ambulation/Gait Ambulation/Gait  assistance: Min assist Gait Distance (Feet): 100 Feet   Gait Pattern/deviations: Step-through pattern;Decreased stride length;Trendelenburg;Decreased dorsiflexion - right;Decreased dorsiflexion - left Gait velocity: decreased Gait velocity interpretation: <1.31 ft/sec, indicative of household ambulator General Gait Details: Increased Trendelenberg gait with distance; cues for activity pacing, upright posture. Very guarded posture with increased bilateral shoulder elevation   Stairs             Wheelchair Mobility    Modified Rankin (Stroke Patients Only)       Balance Overall balance assessment: Needs assistance Sitting-balance support: Feet supported;Bilateral upper extremity supported Sitting balance-Leahy Scale: Poor Sitting balance - Comments: reliant on upper extremities today due to pain; min assist due to posterior lean   Standing balance support: Bilateral upper extremity supported Standing balance-Leahy Scale: Poor Standing balance comment: reliant on external support                            Cognition Arousal/Alertness: Awake/alert Behavior During Therapy: WFL for tasks assessed/performed Overall Cognitive Status: Within Functional Limits for tasks assessed                                        Exercises      General Comments        Pertinent Vitals/Pain Pain Assessment: Faces Faces Pain Scale: Hurts even more Pain Location: back Pain Descriptors / Indicators: Operative site guarding;Grimacing;Discomfort;Pressure Pain Intervention(s): Monitored during session;Limited activity within patient's tolerance    Home Living                      Prior Function  PT Goals (current goals can now be found in the care plan section) Acute Rehab PT Goals Patient Stated Goal: "be more independent." Potential to Achieve Goals: Good Progress towards PT goals: Progressing toward goals    Frequency    Min  5X/week      PT Plan Current plan remains appropriate    Co-evaluation              AM-PAC PT "6 Clicks" Mobility   Outcome Measure  Help needed turning from your back to your side while in a flat bed without using bedrails?: None Help needed moving from lying on your back to sitting on the side of a flat bed without using bedrails?: None Help needed moving to and from a bed to a chair (including a wheelchair)?: A Little Help needed standing up from a chair using your arms (e.g., wheelchair or bedside chair)?: A Little Help needed to walk in hospital room?: A Little Help needed climbing 3-5 steps with a railing? : A Little 6 Click Score: 20    End of Session Equipment Utilized During Treatment: Gait belt;Back brace Activity Tolerance: Patient limited by pain Patient left: in bed;with call bell/phone within reach Nurse Communication: Patient requests pain meds PT Visit Diagnosis: Other abnormalities of gait and mobility (R26.89);Unsteadiness on feet (R26.81);Difficulty in walking, not elsewhere classified (R26.2);Pain     Time: 0911-0921 PT Time Calculation (min) (ACUTE ONLY): 10 min  Charges:  $Gait Training: 8-22 mins                     Ellamae Sia, [place], DPT Acute Rehabilitation Services Pager [phone] Office [phone]    Willy Eddy 10/20/2019, 2:09 PM

## 2019-10-20 NOTE — TOC Progression Note (Signed)
Transition of Care [facility]) - Progression Note    Patient Details  Name: Morgan Bell MRN: [id number] Date of Birth: 1950/11/21  Transition of Care [facility]) CM/SW [place], LCSW Phone Number: 10/20/2019, 8:16 AM  Clinical Narrative:    [place] provided insurance approval for [facility]: [phone] for 5 days.   Expected Discharge Plan: [place] Barriers to Discharge: Continued Medical Work up, Ship broker  Expected Discharge Plan and Services Expected Discharge Plan: [place] In-house Referral: Clinical Social Work   Post Acute Care Choice: [place] Living arrangements for the past 2 months: Apartment                                       Social Determinants of Health (SDOH) Interventions    Readmission Risk Interventions No flowsheet data found.

## 2019-10-20 NOTE — TOC Transition Note (Signed)
Transition of Care [facility]) - CM/SW Discharge Note   Patient Details  Name: SANIAH MANALAC MRN: [id number] Date of Birth: 10/11/1951  Transition of Care [facility]) CM/SW Contact:  Benard Halsted, LCSW Phone Number: 10/20/2019, 11:19 AM   Clinical Narrative:    Patient will DC to: [place] Anticipated DC date: 10/20/19 Family notified: Patient notifying family Transport by: Corey Harold   Per MD patient ready for DC to [facility]. RN, patient, patient's family, and facility notified of DC. Discharge Summary and FL2 sent to facility. RN to call report prior to discharge [phone]). DC packet on chart. Ambulance transport requested for patient.   CSW will sign off for now as social work intervention is no longer needed. Please consult [place] again if new needs arise.  Cedric Fishman, LCSW Clinical Social Worker [phone]    Final next level of care: Skilled Nursing Facility Barriers to Discharge: No Barriers Identified   Patient Goals and CMS Choice Patient states their goals for this hospitalization and ongoing recovery are:: Rehab CMS Medicare.gov Compare Post Acute Care list provided to:: Patient Choice offered to / list presented to : Patient  Discharge Placement   Existing PASRR number confirmed : 10/20/19          Patient chooses bed at: [place] Patient to be transferred to facility by: [facility] Name of family member notified: Patient notifying family Patient and family notified of of transfer: 10/20/19  Discharge Plan and Services In-house Referral: Clinical Social Work   Post Acute Care Choice: [place]                               Social Determinants of Health (SDOH) Interventions     Readmission Risk Interventions No flowsheet data found.

## 2019-10-20 NOTE — Discharge Instructions (Signed)
Wound Care Leave incision open to air. You may shower. Do not scrub directly on incision.  Do not put any creams, lotions, or ointments on incision. Activity Walk each and every day, increasing distance each day. No lifting greater than 5 lbs.  Avoid excessive neck motion. No driving for 2 weeks; may ride as a passenger locally. Wear neck brace at all times except when showering.  If provided soft collar, may wear for comfort unless otherwise instructed. Diet Resume your normal diet.  Return to Work Will be discussed at you follow up appointment. Call Your Doctor If Any of These Occur Redness, drainage, or swelling at the wound.  Temperature greater than 101 degrees. Severe pain not relieved by pain medication. Increased difficulty swallowing. Incision starts to come apart. Follow Up Appt Call today for appointment in 4 weeks ([phone]) or for problems.  If you have any hardware placed in your spine, you will need an x-ray before your appointment.   Spinal Fusion, Adult, Care After This sheet gives you information about how to care for yourself after your procedure. Your doctor may also give you more specific instructions. If you have problems or questions, contact your doctor. Follow these instructions at home: Medicines  Take over-the-counter and prescription medicines only as told by your doctor. These include any medicines for pain or blood-thinning medicines (anticoagulants).  If you were prescribed an antibiotic medicine, take it as told by your doctor. Do not stop taking the antibiotic even if you start to feel better.  Do not drive for 24 hours if you were given a medicine to help you relax (sedative) during your procedure.  Do not drive or use heavy machinery while taking prescription pain medicine. If you have a brace:  Wear the brace as told by your doctor. Take it off only as told by your doctor.  Keep the brace clean. Managing pain, stiffness, and swelling  If  directed, put ice on the surgery area: ? If you have a removable brace, take it off as told by your doctor. ? Put ice in a plastic bag. ? Place a towel between your skin and the bag. ? Leave the ice on for 20 minutes, 2-3 times a day. Surgery cut care   Follow instructions from your doctor about how to take care of your cut from surgery (incision). Make sure you: ? Wash your hands with soap and water before you change your bandage (dressing). If you cannot use soap and water, use hand sanitizer. ? Change your bandage as told by your doctor. ? Leave stitches (sutures), skin glue, or skin tape (adhesive) strips in place. They may need to stay in place for 2 weeks or longer. If tape strips get loose and curl up, you may trim the loose edges. Do not remove tape strips completely unless your doctor says it is okay.  Keep your cut from surgery clean and dry. ? Do not take baths, swim, or use a hot tub until your doctor says it is okay. ? Ask your doctor if you can take showers. You may only be allowed to take sponge baths.  Every day, check your cut from surgery and the area around it for: ? More redness, swelling, or pain. ? Fluid or blood. ? Warmth. ? Pus or a bad smell.  If you have a drain tube, follow instructions from your doctor about caring for it. Do not take out the drain tube or any bandages unless your doctor says it is okay.   Physical activity  Rest and protect your back as much as possible.  Follow instructions from your doctor about how to move. Use good posture to help your spine heal.  Do not lift anything that is heavier than 8 lb (3.6 kg), or the limit that you are told, until your doctor says that it is safe.  Do not twist or bend at the waist until your doctor says it is okay.  It is best if you: ? Do not make pushing and pulling motions. ? Do not sit or lie down in the same position for a long time. ? Do not raise your hands or arms above your head.  Return to  your normal activities as told by your doctor. Ask your doctor what activities are safe for you. Rest and protect your back as much as you can.  Do not start to exercise until your doctor says it is okay. Ask your doctor what kinds of exercise you can do to make your back stronger. General instructions  To prevent blood clots and lessen swelling in your legs: ? Wear compression stockings as told. ? Walk one or more times every few hours as told by your doctor.  Do not use any products that contain nicotine or tobacco, such as cigarettes and e-cigarettes. These can delay bone healing. If you need help quitting, ask your doctor.  To prevent or treat constipation while you are taking prescription pain medicine, your doctor may suggest that you: ? Drink enough fluid to keep your pee (urine) pale yellow. ? Take over-the-counter or prescription medicines. ? Eat foods that are high in fiber. These include fresh fruits and vegetables, whole grains, and beans. ? Limit foods that are high in fat and processed sugars, such as fried and sweet foods.  Keep all follow-up visits as told by your doctor. This is important. Contact a doctor if:  Your pain gets worse.  Your medicine does not help your pain.  Your legs or feet get painful or swollen.  Your cut from surgery is more red, swollen, or painful.  Your cut from surgery feels warm to the touch.  You have: ? Fluid or blood coming from your cut from surgery. ? Pus or a bad smell coming from your cut from surgery. ? A fever. ? Weakness or loss of feeling (numbness) in your legs that is new or getting worse. ? Trouble controlling when you pee (urinate) or poop (have a bowel movement).  You feel sick to your stomach (nauseous).  You throw up (vomit). Get help right away if:  Your pain is very bad.  You have chest pain.  You have trouble breathing.  You start to have a cough. These symptoms may be an emergency. Do not wait to see if  the symptoms will go away. Get medical help right away. Call your local emergency services (911 in the [place] Do not drive yourself to the hospital. Summary  After the procedure, it is common to have pain in your back and pain by your surgery cut(s).  Icing and pain medicines may help to control the pain. Follow directions from your doctor.  Rest and protect your back as much as possible. Do not twist or bend at the waist.  Get up and walk one or more times every few hours as told by your doctor. This information is not intended to replace advice given to you by your health care provider. Make sure you discuss any questions you have with your   health care provider. Document Revised: 01/20/2019 Document Reviewed: 01/13/2017 Elsevier Patient Education  2020 Elsevier Inc.  

## 2019-10-20 NOTE — Progress Notes (Addendum)
Physical Therapy Treatment Patient Details Name: Morgan Bell MRN: [id number] DOB: [birth year] Today's Date: 10/20/2019    History of Present Illness Pt is a 69 y.o. F with significant PMH of RA, HTN, stroke, and traumatic fracture of L2 vertebrae s/p acrylic balloon kyphoplasty. Pt developed worsening back pain and further examination revealed burst fracture and significant stenosis at L2-3. Now s/p posterior decompression with stabilization from T10-L4 10/17/2018.    PT Comments    Treatment session limited by pain. Pt progressing to edge of bed without physical assist and then abruptly lying back down due to increased surgical site pain with mobility. Cues for pursed lip breathing technique. Discussed expectations regarding progression of mobility and becoming more independent with brace donning/doffing. RN notified for pain medication.    Follow Up Recommendations  SNF (declined by CIR)     Equipment Recommendations  None recommended by PT    Recommendations for Other Services       Precautions / Restrictions Precautions Precautions: Fall;Back Precaution Booklet Issued: Yes (comment) Precaution Comments: verbally reviewed and provided written handout Required Braces or Orthoses: Spinal Brace Spinal Brace: Lumbar corset;Applied in sitting position Restrictions Weight Bearing Restrictions: No    Mobility  Bed Mobility Overal bed mobility: Needs Assistance Bed Mobility: Rolling;Sidelying to Sit;Sit to Sidelying Rolling: Supervision Sidelying to sit: Supervision     Sit to sidelying: Supervision General bed mobility comments: Cues for log roll technique, heavy use of bed rail, increased time/effort to rise  Transfers                 General transfer comment: Unable  Ambulation/Gait                 Stairs             Wheelchair Mobility    Modified Rankin (Stroke Patients Only)       Balance Overall balance assessment: Needs  assistance Sitting-balance support: Feet supported;Bilateral upper extremity supported Sitting balance-Leahy Scale: Poor Sitting balance - Comments: reliant on upper extremities today due to pain; min assist due to posterior lean                                    Cognition Arousal/Alertness: Awake/alert Behavior During Therapy: WFL for tasks assessed/performed Overall Cognitive Status: Within Functional Limits for tasks assessed                                        Exercises      General Comments        Pertinent Vitals/Pain Pain Assessment: Faces Faces Pain Scale: Hurts worst Pain Location: back Pain Descriptors / Indicators: Operative site guarding;Grimacing;Discomfort;Pressure Pain Intervention(s): Limited activity within patient's tolerance;Monitored during session;Repositioned;Patient requesting pain meds-RN notified    Home Living                      Prior Function            PT Goals (current goals can now be found in the care plan section) Acute Rehab PT Goals Patient Stated Goal: "be more independent." Potential to Achieve Goals: Good Progress towards PT goals: Not progressing toward goals - comment(limited by pain)    Frequency    Min 5X/week      PT Plan Discharge plan needs to be  updated    Co-evaluation              AM-PAC PT "6 Clicks" Mobility   Outcome Measure  Help needed turning from your back to your side while in a flat bed without using bedrails?: None Help needed moving from lying on your back to sitting on the side of a flat bed without using bedrails?: None Help needed moving to and from a bed to a chair (including a wheelchair)?: A Little Help needed standing up from a chair using your arms (e.g., wheelchair or bedside chair)?: A Little Help needed to walk in hospital room?: A Little Help needed climbing 3-5 steps with a railing? : A Little 6 Click Score: 20    End of Session    Activity Tolerance: Patient limited by pain Patient left: in bed;with call bell/phone within reach Nurse Communication: Patient requests pain meds PT Visit Diagnosis: Other abnormalities of gait and mobility (R26.89);Unsteadiness on feet (R26.81);Difficulty in walking, not elsewhere classified (R26.2);Pain     Time: [id number] PT Time Calculation (min) (ACUTE ONLY): 9 min  Charges:  $Therapeutic Activity: 8-22 mins                     Ellamae Sia, PT, DPT Acute Rehabilitation Services Pager [phone] Office [phone]    Willy Eddy 10/20/2019, 9:09 AM

## 2019-10-20 NOTE — Discharge Summary (Signed)
Physician Discharge Summary  Patient ID: Morgan Bell MRN: [id number] DOB/AGE: 03-05-1951 69 y.o.  Admit date: 10/18/2019 Discharge date: 10/20/2019  Admission Diagnoses: L2 stenosis secondary to compression fracture with retrolisthesis, lumbar myelopathy, neurogenic claudication  Discharge Diagnoses: L2 stenosis secondary to compression fracture with retrolisthesis, lumbar myelopathy, neurogenic claudication Active Problems:   Lumbar stenosis with neurogenic claudication   Discharged Condition: fair  Hospital Course: Patient was admitted to undergo surgical decompression with posterior stabilization using augmentation from T11-L4.  She tolerated surgery well.  She is somewhat weak and debilitated and lives independently will require a period of convalescence before she can return to her home.  That is now been arranged.  Consults: None  Significant Diagnostic Studies: None  Treatments: surgery: Posterior decompression via laminectomy L1-L3 stabilization from T11-L4 with posterior lateral arthrodesis using autograft  Discharge Exam: Blood pressure (!) 142/70, pulse 84, temperature 97.7 F (36.5 C), temperature source Oral, resp. rate 18, height 5\' 1"  (1.549 m), weight 54.4 kg, SpO2 97 %. Incision is clean and dry motor function is intact  Disposition: Discharge disposition: 03-Skilled Nursing Facility       Discharge Instructions    Call MD for:  redness, tenderness, or signs of infection (pain, swelling, redness, odor or green/yellow discharge around incision site)   Complete by: As directed    Call MD for:  severe uncontrolled pain   Complete by: As directed    Call MD for:  temperature >100.4   Complete by: As directed    Diet - low sodium heart healthy   Complete by: As directed    Incentive spirometry RT   Complete by: As directed    Increase activity slowly   Complete by: As directed      Allergies as of 10/20/2019      Reactions   Sulfa Antibiotics Rash   SULFA DRUGS      Medication List    STOP taking these medications   multivitamin with minerals Tabs tablet     TAKE these medications   amLODipine 10 MG tablet Commonly known as: NORVASC TAKE 1/2 TABLET BY MOUTH TWICE DAILY What changed:   how much to take  when to take this   amoxicillin 500 MG tablet Commonly known as: AMOXIL Take 2,000 mg by mouth See admin instructions. Take 4 capsules (2000 mg) by mouth 1 hour prior to dental appointments   aspirin EC 81 MG tablet Take 81 mg daily by mouth.   cetirizine 10 MG tablet Commonly known as: ZYRTEC Take 10 mg by mouth daily.   fenofibrate 145 MG tablet Commonly known as: TRICOR TAKE 1 TABLET BY MOUTH DAILY   gabapentin 100 MG capsule Commonly known as: NEURONTIN Take 100 mg by mouth 4 (four) times daily.   HYDROcodone-acetaminophen 5-325 MG tablet Commonly known as: NORCO/VICODIN Take 1-2 tablets by mouth every 3 (three) hours as needed for moderate pain or severe pain. What changed:   how much to take  when to take this  reasons to take this   irbesartan 300 MG tablet Commonly known as: AVAPRO TAKE 1 TABLET BY MOUTH ONE TIME DAILY What changed: See the new instructions.   Iron Slow Release 143 (45 Fe) MG Tbcr Generic drug: Ferrous Sulfate Take 1 tablet daily by mouth.   leflunomide 20 MG tablet Commonly known as: ARAVA Take 20 mg daily by mouth.   meloxicam 15 MG tablet Commonly known as: MOBIC Take 15 mg by mouth daily as needed for pain.  methocarbamol 500 MG tablet Commonly known as: ROBAXIN Take 1 tablet (500 mg total) by mouth every 6 (six) hours as needed for muscle spasms.   Potassium Chloride ER 20 MEQ Tbcr Take 1 tablet by mouth 2 (two) times daily.   potassium chloride SA 20 MEQ tablet Commonly known as: KLOR-CON Take 2 tablets (40 mEq total) by mouth daily.   rosuvastatin 40 MG tablet Commonly known as: CRESTOR TAKE 1 TABLET BY MOUTH EVERY DAY      Contact information for  after-discharge care    Destination    [facility] Preferred SNF .   Service: Skilled Nursing Contact information: [place] [phone]              Signed: Earleen Newport 10/20/2019, 11:08 AM

## 2019-10-20 NOTE — Plan of Care (Signed)
Pt D/C'd to SNF per MD order. Report was called to [facility] facility. Pt is stable @ D/C and was transported by EMS. Holli Humbles, RN

## 2019-10-22 DIAGNOSIS — R2681 Unsteadiness on feet: Secondary | ICD-10-CM | POA: Diagnosis not present

## 2019-10-22 DIAGNOSIS — S32020G Wedge compression fracture of second lumbar vertebra, subsequent encounter for fracture with delayed healing: Secondary | ICD-10-CM | POA: Diagnosis not present

## 2019-10-22 DIAGNOSIS — M961 Postlaminectomy syndrome, not elsewhere classified: Secondary | ICD-10-CM | POA: Diagnosis not present

## 2019-10-22 DIAGNOSIS — M810 Age-related osteoporosis without current pathological fracture: Secondary | ICD-10-CM | POA: Diagnosis not present

## 2019-11-07 DIAGNOSIS — I2510 Atherosclerotic heart disease of native coronary artery without angina pectoris: Secondary | ICD-10-CM | POA: Diagnosis not present

## 2019-11-07 DIAGNOSIS — E785 Hyperlipidemia, unspecified: Secondary | ICD-10-CM | POA: Diagnosis not present

## 2019-11-07 DIAGNOSIS — S32029D Unspecified fracture of second lumbar vertebra, subsequent encounter for fracture with routine healing: Secondary | ICD-10-CM | POA: Diagnosis not present

## 2019-11-07 DIAGNOSIS — M810 Age-related osteoporosis without current pathological fracture: Secondary | ICD-10-CM | POA: Diagnosis not present

## 2019-11-07 DIAGNOSIS — D509 Iron deficiency anemia, unspecified: Secondary | ICD-10-CM | POA: Diagnosis not present

## 2019-11-07 DIAGNOSIS — E7800 Pure hypercholesterolemia, unspecified: Secondary | ICD-10-CM | POA: Diagnosis not present

## 2019-11-07 DIAGNOSIS — M4316 Spondylolisthesis, lumbar region: Secondary | ICD-10-CM | POA: Diagnosis not present

## 2019-11-07 DIAGNOSIS — I10 Essential (primary) hypertension: Secondary | ICD-10-CM | POA: Diagnosis not present

## 2019-11-07 DIAGNOSIS — E46 Unspecified protein-calorie malnutrition: Secondary | ICD-10-CM | POA: Diagnosis not present

## 2019-11-07 DIAGNOSIS — K5900 Constipation, unspecified: Secondary | ICD-10-CM | POA: Diagnosis not present

## 2019-11-07 DIAGNOSIS — M47819 Spondylosis without myelopathy or radiculopathy, site unspecified: Secondary | ICD-10-CM | POA: Diagnosis not present

## 2019-11-07 DIAGNOSIS — M48062 Spinal stenosis, lumbar region with neurogenic claudication: Secondary | ICD-10-CM | POA: Diagnosis not present

## 2019-11-07 DIAGNOSIS — E781 Pure hyperglyceridemia: Secondary | ICD-10-CM | POA: Diagnosis not present

## 2019-11-07 DIAGNOSIS — J309 Allergic rhinitis, unspecified: Secondary | ICD-10-CM | POA: Diagnosis not present

## 2019-11-07 DIAGNOSIS — G43909 Migraine, unspecified, not intractable, without status migrainosus: Secondary | ICD-10-CM | POA: Diagnosis not present

## 2019-11-07 DIAGNOSIS — M069 Rheumatoid arthritis, unspecified: Secondary | ICD-10-CM | POA: Diagnosis not present

## 2019-11-09 DIAGNOSIS — M8008XA Age-related osteoporosis with current pathological fracture, vertebra(e), initial encounter for fracture: Secondary | ICD-10-CM | POA: Diagnosis not present

## 2019-11-10 ENCOUNTER — Ambulatory Visit: Payer: Medicare Other | Admitting: Internal Medicine

## 2019-11-17 ENCOUNTER — Other Ambulatory Visit: Payer: Self-pay | Admitting: Cardiovascular Disease

## 2019-11-20 ENCOUNTER — Encounter (INDEPENDENT_AMBULATORY_CARE_PROVIDER_SITE_OTHER): Payer: Self-pay

## 2019-12-09 DIAGNOSIS — M8008XA Age-related osteoporosis with current pathological fracture, vertebra(e), initial encounter for fracture: Secondary | ICD-10-CM | POA: Diagnosis not present

## 2019-12-09 DIAGNOSIS — I10 Essential (primary) hypertension: Secondary | ICD-10-CM | POA: Diagnosis not present

## 2019-12-14 ENCOUNTER — Other Ambulatory Visit: Payer: Self-pay

## 2019-12-14 ENCOUNTER — Telehealth: Payer: Self-pay | Admitting: Cardiovascular Disease

## 2019-12-14 NOTE — Telephone Encounter (Signed)
New Message   *STAT* If patient is at the pharmacy, call can be transferred to refill team.   1. Which medications need to be refilled? (please list name of each medication and dose if known) amLODipine (NORVASC) 10 MG tablet  2. Which pharmacy/location (including street and city if local pharmacy) is medication to be sent to? [facility] [place], [place]  3. Do they need a 30 day or 90 day supply? 30 day    Patient is out of medication, needs ASAP.

## 2019-12-15 MED ORDER — AMLODIPINE BESYLATE 10 MG PO TABS: 5.0000 mg | ORAL_TABLET | Freq: Two times a day (BID) | ORAL | 1 refills | Status: DC

## 2019-12-16 ENCOUNTER — Other Ambulatory Visit: Payer: Self-pay

## 2019-12-16 NOTE — Telephone Encounter (Signed)
OK to refill

## 2020-01-02 DIAGNOSIS — M1711 Unilateral primary osteoarthritis, right knee: Secondary | ICD-10-CM | POA: Diagnosis not present

## 2020-01-10 DIAGNOSIS — M1711 Unilateral primary osteoarthritis, right knee: Secondary | ICD-10-CM | POA: Diagnosis not present

## 2020-01-10 DIAGNOSIS — S83241A Other tear of medial meniscus, current injury, right knee, initial encounter: Secondary | ICD-10-CM | POA: Diagnosis not present

## 2020-01-12 DIAGNOSIS — M1711 Unilateral primary osteoarthritis, right knee: Secondary | ICD-10-CM | POA: Diagnosis not present

## 2020-01-19 DIAGNOSIS — M1711 Unilateral primary osteoarthritis, right knee: Secondary | ICD-10-CM | POA: Diagnosis not present

## 2020-01-23 DIAGNOSIS — M25561 Pain in right knee: Secondary | ICD-10-CM | POA: Diagnosis not present

## 2020-01-26 DIAGNOSIS — M1711 Unilateral primary osteoarthritis, right knee: Secondary | ICD-10-CM | POA: Diagnosis not present

## 2020-02-02 DIAGNOSIS — Z79899 Other long term (current) drug therapy: Secondary | ICD-10-CM | POA: Diagnosis not present

## 2020-02-02 DIAGNOSIS — M5136 Other intervertebral disc degeneration, lumbar region: Secondary | ICD-10-CM | POA: Diagnosis not present

## 2020-02-02 DIAGNOSIS — M0589 Other rheumatoid arthritis with rheumatoid factor of multiple sites: Secondary | ICD-10-CM | POA: Diagnosis not present

## 2020-02-02 DIAGNOSIS — M150 Primary generalized (osteo)arthritis: Secondary | ICD-10-CM | POA: Diagnosis not present

## 2020-02-10 DIAGNOSIS — M8008XA Age-related osteoporosis with current pathological fracture, vertebra(e), initial encounter for fracture: Secondary | ICD-10-CM | POA: Diagnosis not present

## 2020-02-10 DIAGNOSIS — S22060A Wedge compression fracture of T7-T8 vertebra, initial encounter for closed fracture: Secondary | ICD-10-CM | POA: Diagnosis not present

## 2020-02-13 ENCOUNTER — Other Ambulatory Visit: Payer: Self-pay | Admitting: Neurological Surgery

## 2020-02-13 DIAGNOSIS — S22060A Wedge compression fracture of T7-T8 vertebra, initial encounter for closed fracture: Secondary | ICD-10-CM

## 2020-02-28 DIAGNOSIS — S22060A Wedge compression fracture of T7-T8 vertebra, initial encounter for closed fracture: Secondary | ICD-10-CM | POA: Diagnosis not present

## 2020-03-02 DIAGNOSIS — S22060A Wedge compression fracture of T7-T8 vertebra, initial encounter for closed fracture: Secondary | ICD-10-CM | POA: Diagnosis not present

## 2020-03-02 DIAGNOSIS — S22070A Wedge compression fracture of T9-T10 vertebra, initial encounter for closed fracture: Secondary | ICD-10-CM | POA: Diagnosis not present

## 2020-03-02 DIAGNOSIS — Z780 Asymptomatic menopausal state: Secondary | ICD-10-CM | POA: Diagnosis not present

## 2020-03-02 DIAGNOSIS — M810 Age-related osteoporosis without current pathological fracture: Secondary | ICD-10-CM | POA: Diagnosis not present

## 2020-03-08 DIAGNOSIS — M8008XA Age-related osteoporosis with current pathological fracture, vertebra(e), initial encounter for fracture: Secondary | ICD-10-CM | POA: Diagnosis not present

## 2020-03-08 DIAGNOSIS — S22060A Wedge compression fracture of T7-T8 vertebra, initial encounter for closed fracture: Secondary | ICD-10-CM | POA: Diagnosis not present

## 2020-03-08 DIAGNOSIS — S22070A Wedge compression fracture of T9-T10 vertebra, initial encounter for closed fracture: Secondary | ICD-10-CM | POA: Diagnosis not present

## 2020-03-13 DIAGNOSIS — M1711 Unilateral primary osteoarthritis, right knee: Secondary | ICD-10-CM | POA: Diagnosis not present

## 2020-03-16 ENCOUNTER — Telehealth: Payer: Self-pay

## 2020-03-16 NOTE — Telephone Encounter (Signed)
If she just had surgery, would recommend she contact her surgeon. If pain or diarrhea is severe, would recommend UC/ER eval

## 2020-03-16 NOTE — Telephone Encounter (Signed)
I left VM for patient to return phone call.   

## 2020-03-16 NOTE — Telephone Encounter (Signed)
Patient states she just had back surgery, and she was given hydrocodone. Patient states that she believes this has messed caused her to have frequent diarrhea and slight abdominal discomfort. She states that she does not want to take anything that she shouldn't be, due to just having back surgery, and she is wanting recommendations? Please advise,

## 2020-03-19 NOTE — Telephone Encounter (Signed)
[facility] - Client TELEPHONE ADVICE RECORD [facility] Patient Name: Morgan Bell Gender: Female DOB: 28-Apr-1951 Age: 69 Y 92 M 29 D Return Phone Number: [phone] (Primary) Address: City/State/Zip: [place] [address] Client [facility] - Client Client Site [facility] Physician [facility] - PHYSICIAN, NOT LISTED- MD Contact Type Call Who Is Calling Patient / Member / Family / Caregiver Call Type Triage / Clinical Relationship To Patient Self Return Phone Number [phone] (Primary) Chief Complaint Abdominal Pain Reason for Call Symptomatic / Request for [place] states she had back surgery and she took to much hydrocodone and it has messed up he stomach. She said it is hurting and she has diarrhea. Translation No Nurse Assessment Nurse: Mirna Mires, RN, Katharine Look Date/Time (Eastern Time): 03/16/2020 5:19:37 PM Confirm and document reason for call. If symptomatic, describe symptoms. ---Caller states she had back surgery 2 weeks ago and she took to much hydrocodone and it has messed up he stomach. She said it is hurting and she has diarrhea. Has the patient had close contact with a person known or suspected to have the novel coronavirus illness OR traveled / lives in area with major community spread (including international travel) in the last 14 days from the onset of symptoms? * If Asymptomatic, screen for exposure and travel within the last 14 days. ---No Does the patient have any new or worsening symptoms? ---Yes Will a triage be completed? ---Yes Related visit to physician within the last 2 weeks? ---Yes Does the PT have any chronic conditions? (i.e. diabetes, asthma, this includes High risk factors for pregnancy, etc.) ---No Is this a behavioral health or substance abuse call? ---No Guidelines Guideline Title Affirmed Question Affirmed Notes Nurse Date/Time  (Eastern Time) Diarrhea [1] MODERATE diarrhea (e.g., 4-6 times / day more than normal) AND [2] present > 48 hours (2 days) Mirna Mires, RN, Katharine Look 03/16/2020 5:21:49 PM Disp. Time (Eastern Time) Disposition Final UserPLEASE NOTE: All timestamps contained within this report are represented as [place] Standard Time. CONFIDENTIALTY NOTICE: This fax transmission is intended only for the addressee. It contains information that is legally privileged, confidential or otherwise protected from use or disclosure. If you are not the intended recipient, you are strictly prohibited from reviewing, disclosing, copying using or disseminating any of this information or taking any action in reliance on or regarding this information. If you have received this fax in error, please notify us immediately by telephone so that we can arrange for its return to [place]. Phone: [phone], Toll-Free: [phone], Fax: [phone] Page: 2 of 2 Call Id: [id number] 03/16/2020 5:27:58 PM See PCP within 24 Hours Yes Mirna Mires, RN, [name] Disagree/Comply Comply Caller Understands Yes PreDisposition Did not know what to do Care Advice Given Per Guideline SEE PCP WITHIN 24 HOURS: FLUID THERAPY DURING MILD TO MODERATE DIARRHEA: * SPORTS DRINKS: You can also drink a sports drinks (e.g., Gatorade, Powerade) to help treat and prevent dehydration. For it to work best, mix it half and half with water. * Avoid caffeinated beverages (Reason: caffeine is mildly dehydrating). DIARRHEA MEDICINE - LOPERAMIDE (IMODIUM AD): * Adult dosage: 4 mg (2 capsules) is the recommended first dose. You may take an additional 2 mg (1 capsule) after each loose BM. CALL BACK IF: * Signs of dehydration occur (e.g., no urine over 12 hours, very dry mouth, lightheaded, etc.) * Bloody stools * Constant or severe abdominal pain * You become worse. CARE ADVICE given  per Diarrhea (Adult) guideline. Referrals [facility]

## 2020-03-19 NOTE — Telephone Encounter (Signed)
Patient notified and verbalized understanding. 

## 2020-03-19 NOTE — Telephone Encounter (Signed)
[facility] - Client TELEPHONE ADVICE RECORD [facility] Patient Name: Morgan Bell Gender: Female DOB: [birth year] Age: 69 Y 16 M 30 D Return Phone Number: [phone] (Primary) Address: City/State/Zip: [place] [address] Client [facility] - Client Client Site [place] Physician Webb Silversmith - NP Contact Type Call Who Is Calling Patient / Member / Family / Caregiver Call Type Triage / Clinical Relationship To Patient Self Return Phone Number [phone] (Primary) Chief Complaint Abdominal Pain Reason for Call Request to Speak to a Physician Initial Comment Caller stated she recently had surgery and ended up having a hematoma. Caller stated she is currently taking Oxycodone and now has diarrhea and abdominal pain. Translation No Nurse Assessment Nurse: Hassell Done, RN, Melanie Date/Time (Eastern Time): 03/17/2020 4:05:04 PM Confirm and document reason for call. If symptomatic, describe symptoms. ---Caller states she had surgery on her back a week ago Thursday. She had a hematoma after the surgery. She had been taking hydro 2 pills every 4 hours but her back is feeling better. She now has abdominal pain and diarrhea. Pain is in the L side. The pain is stabbing. Has the patient had close contact with a person known or suspected to have the novel coronavirus illness OR traveled / lives in area with major community spread (including international travel) in the last 14 days from the onset of symptoms? * If Asymptomatic, screen for exposure and travel within the last 14 days. ---No Does the patient have any new or worsening symptoms? ---Yes Will a triage be completed? ---Yes Related visit to physician within the last 2 weeks? ---Yes Does the PT have any chronic conditions? (i.e. diabetes, asthma, this includes High risk factors for pregnancy, etc.) ---No Is this a behavioral health or  substance abuse call? ---No Guidelines Guideline Title Affirmed Question Affirmed Notes Nurse Date/Time Eilene Ghazi Time) Abdominal Pain - Female Age > 60 years Arnaldo Natal 03/17/2020 4:08:30 PM Disp. Time Eilene Ghazi Time) Disposition Final User 03/17/2020 4:12:55 PM See PCP within 24 Hours Yes Hassell Done, RN, MelaniePLEASE NOTE: All timestamps contained within this report are represented as [place] Standard Time. CONFIDENTIALTY NOTICE: This fax transmission is intended only for the addressee. It contains information that is legally privileged, confidential or otherwise protected from use or disclosure. If you are not the intended recipient, you are strictly prohibited from reviewing, disclosing, copying using or disseminating any of this information or taking any action in reliance on or regarding this information. If you have received this fax in error, please notify us immediately by telephone so that we can arrange for its return to [place]. Phone: [phone], Toll-Free: [phone], Fax: [phone] Page: 2 of 2 Call Id: [id number] [place] Disagree/Comply Comply Caller Understands Yes PreDisposition Call Doctor Care Advice Given Per Guideline SEE PCP WITHIN 24 HOURS: * IF OFFICE WILL BE CLOSED: You need to be seen within the next 24 hours. A clinic or an urgent care center is often a good source of care if your doctor's office is closed or you can't get an appointment. REST: * Lie down. * Rest until you feel better. DRINK CLEAR FLUIDS: * Drink clear fluids only (e.g., water, flat soft drinks or half-strength Gatorade). * Sip small amounts at a time, until you feel better and the pain is gone. * Then slowly return to a regular diet. CALL BACK IF: * Severe pain lasts over 1 hour * Constant pain lasts over 2 hours * You become  worse. CARE ADVICE given per Abdominal Pain, Female (Adult) guideline. Referrals GO TO FACILITY UNDECIDED

## 2020-03-21 ENCOUNTER — Other Ambulatory Visit: Payer: Self-pay

## 2020-03-21 ENCOUNTER — Ambulatory Visit (INDEPENDENT_AMBULATORY_CARE_PROVIDER_SITE_OTHER)
Admission: RE | Admit: 2020-03-21 | Discharge: 2020-03-21 | Disposition: A | Discharge status: Home / Self Care | Payer: Medicare Other | Source: Ambulatory Visit | Attending: Internal Medicine | Admitting: Internal Medicine

## 2020-03-21 ENCOUNTER — Ambulatory Visit (INDEPENDENT_AMBULATORY_CARE_PROVIDER_SITE_OTHER): Payer: Medicare Other | Admitting: Internal Medicine

## 2020-03-21 ENCOUNTER — Encounter: Payer: Self-pay | Admitting: Internal Medicine

## 2020-03-21 VITALS — BP 144/84 | HR 90 | Temp 97.1°F | Wt 135.0 lb

## 2020-03-21 DIAGNOSIS — R140 Abdominal distension (gaseous): Secondary | ICD-10-CM | POA: Diagnosis not present

## 2020-03-21 DIAGNOSIS — R150 Incomplete defecation: Secondary | ICD-10-CM | POA: Diagnosis not present

## 2020-03-21 DIAGNOSIS — R110 Nausea: Secondary | ICD-10-CM | POA: Diagnosis not present

## 2020-03-21 DIAGNOSIS — R1084 Generalized abdominal pain: Secondary | ICD-10-CM

## 2020-03-21 DIAGNOSIS — K5900 Constipation, unspecified: Secondary | ICD-10-CM | POA: Diagnosis not present

## 2020-03-21 NOTE — Patient Instructions (Signed)

## 2020-03-21 NOTE — Progress Notes (Signed)
Subjective:    Patient ID: Morgan Bell, female    DOB: Feb 24, 1951, 69 y.o.   MRN: [id number]  HPI  Pt presents to the clinic today with c/o abdominal pain, bloating, nausea and rectal leakage. She reports this started years ago but seemed to worsen following her back surgery 02/2020. She describes the abdominal pain as achy. She reports nausea, decreased appetite, only eating 1 meal per day. She denies vomiting, diarrhea or constipation. She occasionally has blood in her stool secondary to hemorrhoids. She denies fever, chills or body aches. She has tried Tums, Mylanta and Pepto Bismol with some relief.  Review of Systems      Past Medical History:  Diagnosis Date  . Arthritis    RA  . Exertional angina ([place]) 07/16/2018   CT coronary 09/13/18: Coronary Ca score 62.1, 68% for age/sex matched control, No evidence of obstructive CAD (1-24% LAD, RCA)  . Family history of adverse reaction to anesthesia    son has difficulty waking   . Headache(784.0)    HX MIGRAINES   . Hypertension   . Melanoma ([place])   . Obesity   . Stroke [facility])    MINI STROKE     21 YRS AGO     Current Outpatient Medications  Medication Sig Dispense Refill  . amLODipine (NORVASC) 10 MG tablet Take 0.5 tablets (5 mg total) by mouth 2 (two) times daily. 90 tablet 1  . amoxicillin (AMOXIL) 500 MG tablet Take 2,000 mg by mouth See admin instructions. Take 4 capsules (2000 mg) by mouth 1 hour prior to dental appointments    . aspirin EC 81 MG tablet Take 81 mg daily by mouth.    . cetirizine (ZYRTEC) 10 MG tablet Take 10 mg by mouth daily.    . fenofibrate (TRICOR) 145 MG tablet TAKE 1 TABLET BY MOUTH DAILY 30 tablet 11  . Ferrous Sulfate (IRON SLOW RELEASE) 143 (45 Fe) MG TBCR Take 1 tablet daily by mouth.    . gabapentin (NEURONTIN) 100 MG capsule Take 100 mg by mouth 4 (four) times daily.    Marland Kitchen HYDROcodone-acetaminophen (NORCO/VICODIN) 5-325 MG tablet Take 1-2 tablets by mouth every 3 (three) hours as needed for  moderate pain or severe pain. 30 tablet 0  . irbesartan (AVAPRO) 300 MG tablet TAKE 1 TABLET BY MOUTH ONE TIME DAILY (Patient taking differently: Take 300 mg by mouth daily. ) 30 tablet 7  . leflunomide (ARAVA) 20 MG tablet Take 20 mg daily by mouth.    . meloxicam (MOBIC) 15 MG tablet Take 15 mg by mouth daily as needed for pain.     . methocarbamol (ROBAXIN) 500 MG tablet Take 1 tablet (500 mg total) by mouth every 6 (six) hours as needed for muscle spasms. 40 tablet 3  . Potassium Chloride ER 20 MEQ TBCR Take 1 tablet by mouth 2 (two) times daily. 60 tablet 0  . potassium chloride SA (K-DUR,KLOR-CON) 20 MEQ tablet Take 2 tablets (40 mEq total) by mouth daily. 60 tablet 1  . rosuvastatin (CRESTOR) 40 MG tablet TAKE 1 TABLET BY MOUTH EVERY DAY 30 tablet 5   No current facility-administered medications for this visit.    Allergies  Allergen Reactions  . Sulfa Antibiotics Rash    SULFA DRUGS    Family History  Problem Relation Age of Onset  . Alzheimer's disease Mother   . Heart disease Father        MI, died at 50  . Alzheimer's disease Maternal  Grandmother   . Cancer Maternal Grandfather        lung  . Heart attack Paternal Grandfather   . Tuberculosis Sister     Social History   Socioeconomic History  . Marital status: Divorced    Spouse name: Not on file  . Number of children: 3  . Years of education: 9  . Highest education level: Not on file  Occupational History    Employer: [employer]    Comment: Network engineer; raises service dogs.   Tobacco Use  . Smoking status: Never Smoker  . Smokeless tobacco: Never Used  Substance and Sexual Activity  . Alcohol use: No  . Drug use: No  . Sexual activity: Never  Other Topics Concern  . Not on file  Social History Narrative  . Not on file   Social Determinants of Health   Financial Resource Strain:   . Difficulty of Paying Living Expenses:   Food Insecurity:   . Worried About Charity fundraiser in the Last  Year:   . Arboriculturist in the Last Year:   Transportation Needs:   . Film/video editor (Medical):   Marland Kitchen Lack of Transportation (Non-Medical):   Physical Activity:   . Days of Exercise per Week:   . Minutes of Exercise per Session:   Stress:   . Feeling of Stress :   Social Connections:   . Frequency of Communication with Friends and Family:   . Frequency of Social Gatherings with Friends and Family:   . Attends Religious Services:   . Active Member of Clubs or Organizations:   . Attends Archivist Meetings:   Marland Kitchen Marital Status:   Intimate Partner Violence:   . Fear of Current or Ex-Partner:   . Emotionally Abused:   Marland Kitchen Physically Abused:   . Sexually Abused:      Constitutional: Denies fever, malaise, fatigue, headache or abrupt weight changes.  HEENT: Denies eye pain, eye redness, ear pain, ringing in the ears, wax buildup, runny nose, nasal congestion, bloody nose, or sore throat. Respiratory: Denies difficulty breathing, shortness of breath, cough or sputum production.   Cardiovascular: Denies chest pain, chest tightness, palpitations or swelling in the hands or feet.  Gastrointestinal: Pt reports abdominal pain, bloating, nausea and blood in the stool. Denies constipation, diarrhea.  GU: Denies urgency, frequency, pain with urination, burning sensation, blood in urine, odor or discharge.  No other specific complaints in a complete review of systems (except as listed in HPI above).  Objective:   Physical Exam  BP (!) 144/84   Pulse 90   Temp (!) 97.1 F (36.2 C) (Temporal)   Wt 135 lb (61.2 kg)   SpO2 98%   BMI 25.51 kg/m   Wt Readings from Last 3 Encounters:  10/18/19 120 lb (54.4 kg)  10/12/19 120 lb 12.8 oz (54.8 kg)  09/19/19 124 lb (56.2 kg)    General: Appears her stated age, well developed, well nourished in NAD. Skin: Warm, dry and intact. No rashes noted. Cardiovascular: Normal rate and rhythm. S1,S2 noted.  No murmur, rubs or gallops  noted.  Pulmonary/Chest: Normal effort and positive vesicular breath sounds. No respiratory distress. No wheezes, rales or ronchi noted.  Abdomen: Soft and tender in the LUQ. Hyperactive bowel sounds. No distention or masses noted.  Rectal: External hemorrhoids noted. Normal rectal tone. Hemoccult positive. Neurological: Alert and oriented.    BMET    Component Value Date/Time   NA 139 10/19/2019  0345   NA 142 04/23/2018 0829   K 4.6 10/19/2019 0345   CL 104 10/19/2019 0345   CO2 22 10/19/2019 0345   GLUCOSE 103 (H) 10/19/2019 0345   BUN 29 (H) 10/19/2019 0345   BUN 24 04/23/2018 0829   CREATININE 1.13 (H) 10/19/2019 0345   CREATININE 1.45 (H) 11/10/2016 1418   CALCIUM 8.9 10/19/2019 0345   GFRNONAA 49 (L) 10/19/2019 0345   GFRAA 57 (L) 10/19/2019 0345    Lipid Panel     Component Value Date/Time   CHOL 104 09/06/2018 1242   CHOL 120 04/23/2018 0829   TRIG 202.0 (H) 09/06/2018 1242   HDL 22.90 (L) 09/06/2018 1242   HDL 17 (L) 04/23/2018 0829   CHOLHDL 5 09/06/2018 1242   VLDL 40.4 (H) 09/06/2018 1242   LDLCALC 41 04/23/2018 0829    CBC    Component Value Date/Time   WBC 11.5 (H) 10/19/2019 0345   RBC 3.21 (L) 10/19/2019 0345   HGB 9.7 (L) 10/19/2019 0345   HCT 30.0 (L) 10/19/2019 0345   PLT 124 (L) 10/19/2019 0345   MCV 93.5 10/19/2019 0345   MCV 93.1 01/16/2014 2105   MCH 30.2 10/19/2019 0345   MCHC 32.3 10/19/2019 0345   RDW 16.0 (H) 10/19/2019 0345   LYMPHSABS 2.3 02/28/2013 1545   MONOABS 1.0 02/28/2013 1545   EOSABS 0.3 02/28/2013 1545   BASOSABS 0.1 02/28/2013 1545    Hgb A1C Lab Results  Component Value Date   HGBA1C 5.7 08/26/2017            Assessment & Plan:  Generalized Abdominal Pain, Nausea, Abdominal Bloating, Rectal Leakage:  Will check CBC, Amylase, Lipase, CMET and H Pylori KUB today Ok to continue Tums, Mylanta, Pepto Bismol  Will follow up after labs, return precautions discussed  Webb Silversmith, NP This visit occurred  during the SARS-CoV-2 public health emergency.  Safety protocols were in place, including screening questions prior to the visit, additional usage of staff PPE, and extensive cleaning of exam room while observing appropriate contact time as indicated for disinfecting solutions.

## 2020-03-22 ENCOUNTER — Encounter: Payer: Self-pay | Admitting: Internal Medicine

## 2020-03-22 LAB — COMPREHENSIVE METABOLIC PANEL
ALT: 8 U/L (ref 0–35)
AST: 23 U/L (ref 0–37)
Albumin: 3.8 g/dL (ref 3.5–5.2)
Alkaline Phosphatase: 116 U/L (ref 39–117)
BUN: 22 mg/dL (ref 6–23)
CO2: 28 mEq/L (ref 19–32)
Calcium: 10.1 mg/dL (ref 8.4–10.5)
Chloride: 100 mEq/L (ref 96–112)
Creatinine, Ser: 1.08 mg/dL (ref 0.40–1.20)
GFR: 49.96 mL/min — ABNORMAL LOW (ref >60.00)
Glucose, Bld: 98 mg/dL (ref 70–99)
Potassium: 3.3 mEq/L — ABNORMAL LOW (ref 3.5–5.1)
Sodium: 137 mEq/L (ref 135–145)
Total Bilirubin: 0.6 mg/dL (ref 0.2–1.2)
Total Protein: 6 g/dL (ref 6.0–8.3)

## 2020-03-22 LAB — CBC
HCT: 33.3 % — ABNORMAL LOW (ref 36.0–46.0)
Hemoglobin: 10.9 g/dL — ABNORMAL LOW (ref 12.0–15.0)
MCHC: 32.6 g/dL (ref 30.0–36.0)
MCV: 87.3 fl (ref 78.0–100.0)
Platelets: 188 10*3/uL (ref 150.0–400.0)
RBC: 3.82 Mil/uL — ABNORMAL LOW (ref 3.87–5.11)
RDW: 16 % — ABNORMAL HIGH (ref 11.5–15.5)
WBC: 8.4 10*3/uL (ref 4.0–10.5)

## 2020-03-22 LAB — H. PYLORI ANTIBODY, IGG: H Pylori IgG: NEGATIVE

## 2020-03-22 LAB — AMYLASE: Amylase: 39 U/L (ref 27–131)

## 2020-03-22 LAB — LIPASE: Lipase: 7 U/L — ABNORMAL LOW (ref 11.0–59.0)

## 2020-03-28 DIAGNOSIS — S22060A Wedge compression fracture of T7-T8 vertebra, initial encounter for closed fracture: Secondary | ICD-10-CM | POA: Diagnosis not present

## 2020-04-05 ENCOUNTER — Other Ambulatory Visit: Payer: Self-pay | Admitting: Cardiovascular Disease

## 2020-04-25 ENCOUNTER — Other Ambulatory Visit: Payer: Medicare Other

## 2020-05-04 DIAGNOSIS — M150 Primary generalized (osteo)arthritis: Secondary | ICD-10-CM | POA: Diagnosis not present

## 2020-05-04 DIAGNOSIS — M0589 Other rheumatoid arthritis with rheumatoid factor of multiple sites: Secondary | ICD-10-CM | POA: Diagnosis not present

## 2020-05-04 DIAGNOSIS — M5136 Other intervertebral disc degeneration, lumbar region: Secondary | ICD-10-CM | POA: Diagnosis not present

## 2020-05-04 DIAGNOSIS — Z79899 Other long term (current) drug therapy: Secondary | ICD-10-CM | POA: Diagnosis not present

## 2020-05-08 ENCOUNTER — Other Ambulatory Visit: Payer: Self-pay | Admitting: Cardiovascular Disease

## 2020-05-13 ENCOUNTER — Inpatient Hospital Stay (HOSPITAL_COMMUNITY)
Admission: EM | Admit: 2020-05-13 | Discharge: 2020-05-17 | DRG: 064 | Disposition: A | Discharge status: Skilled Nursing Facility | Payer: Medicare Other | Attending: Neurology | Admitting: Neurology

## 2020-05-13 ENCOUNTER — Emergency Department (HOSPITAL_COMMUNITY): Payer: Medicare Other

## 2020-05-13 ENCOUNTER — Inpatient Hospital Stay (HOSPITAL_COMMUNITY): Payer: Medicare Other

## 2020-05-13 ENCOUNTER — Encounter (HOSPITAL_COMMUNITY): Payer: Self-pay | Admitting: Emergency Medicine

## 2020-05-13 ENCOUNTER — Other Ambulatory Visit: Payer: Self-pay

## 2020-05-13 DIAGNOSIS — Z882 Allergy status to sulfonamides status: Secondary | ICD-10-CM

## 2020-05-13 DIAGNOSIS — M069 Rheumatoid arthritis, unspecified: Secondary | ICD-10-CM | POA: Diagnosis present

## 2020-05-13 DIAGNOSIS — G459 Transient cerebral ischemic attack, unspecified: Secondary | ICD-10-CM | POA: Diagnosis not present

## 2020-05-13 DIAGNOSIS — R739 Hyperglycemia, unspecified: Secondary | ICD-10-CM | POA: Diagnosis present

## 2020-05-13 DIAGNOSIS — G936 Cerebral edema: Secondary | ICD-10-CM | POA: Diagnosis not present

## 2020-05-13 DIAGNOSIS — I739 Peripheral vascular disease, unspecified: Secondary | ICD-10-CM | POA: Diagnosis not present

## 2020-05-13 DIAGNOSIS — E876 Hypokalemia: Secondary | ICD-10-CM | POA: Diagnosis present

## 2020-05-13 DIAGNOSIS — Z85820 Personal history of malignant melanoma of skin: Secondary | ICD-10-CM | POA: Diagnosis not present

## 2020-05-13 DIAGNOSIS — G43909 Migraine, unspecified, not intractable, without status migrainosus: Secondary | ICD-10-CM | POA: Diagnosis not present

## 2020-05-13 DIAGNOSIS — Z8669 Personal history of other diseases of the nervous system and sense organs: Secondary | ICD-10-CM | POA: Diagnosis not present

## 2020-05-13 DIAGNOSIS — I2510 Atherosclerotic heart disease of native coronary artery without angina pectoris: Secondary | ICD-10-CM | POA: Diagnosis present

## 2020-05-13 DIAGNOSIS — Z8249 Family history of ischemic heart disease and other diseases of the circulatory system: Secondary | ICD-10-CM

## 2020-05-13 DIAGNOSIS — I614 Nontraumatic intracerebral hemorrhage in cerebellum: Secondary | ICD-10-CM | POA: Diagnosis not present

## 2020-05-13 DIAGNOSIS — R270 Ataxia, unspecified: Secondary | ICD-10-CM | POA: Diagnosis present

## 2020-05-13 DIAGNOSIS — R2681 Unsteadiness on feet: Secondary | ICD-10-CM | POA: Diagnosis not present

## 2020-05-13 DIAGNOSIS — J309 Allergic rhinitis, unspecified: Secondary | ICD-10-CM | POA: Diagnosis not present

## 2020-05-13 DIAGNOSIS — Z831 Family history of other infectious and parasitic diseases: Secondary | ICD-10-CM

## 2020-05-13 DIAGNOSIS — E785 Hyperlipidemia, unspecified: Secondary | ICD-10-CM | POA: Diagnosis present

## 2020-05-13 DIAGNOSIS — R278 Other lack of coordination: Secondary | ICD-10-CM | POA: Diagnosis not present

## 2020-05-13 DIAGNOSIS — M48062 Spinal stenosis, lumbar region with neurogenic claudication: Secondary | ICD-10-CM | POA: Diagnosis not present

## 2020-05-13 DIAGNOSIS — C4359 Malignant melanoma of other part of trunk: Secondary | ICD-10-CM | POA: Diagnosis present

## 2020-05-13 DIAGNOSIS — I619 Nontraumatic intracerebral hemorrhage, unspecified: Secondary | ICD-10-CM | POA: Diagnosis not present

## 2020-05-13 DIAGNOSIS — M2550 Pain in unspecified joint: Secondary | ICD-10-CM | POA: Diagnosis not present

## 2020-05-13 DIAGNOSIS — Z791 Long term (current) use of non-steroidal anti-inflammatories (NSAID): Secondary | ICD-10-CM | POA: Diagnosis not present

## 2020-05-13 DIAGNOSIS — G935 Compression of brain: Secondary | ICD-10-CM | POA: Diagnosis present

## 2020-05-13 DIAGNOSIS — I340 Nonrheumatic mitral (valve) insufficiency: Secondary | ICD-10-CM | POA: Diagnosis not present

## 2020-05-13 DIAGNOSIS — Z7982 Long term (current) use of aspirin: Secondary | ICD-10-CM | POA: Diagnosis not present

## 2020-05-13 DIAGNOSIS — Z8673 Personal history of transient ischemic attack (TIA), and cerebral infarction without residual deficits: Secondary | ICD-10-CM

## 2020-05-13 DIAGNOSIS — Z79899 Other long term (current) drug therapy: Secondary | ICD-10-CM

## 2020-05-13 DIAGNOSIS — I6522 Occlusion and stenosis of left carotid artery: Secondary | ICD-10-CM | POA: Diagnosis not present

## 2020-05-13 DIAGNOSIS — Z7401 Bed confinement status: Secondary | ICD-10-CM | POA: Diagnosis not present

## 2020-05-13 DIAGNOSIS — Z820 Family history of epilepsy and other diseases of the nervous system: Secondary | ICD-10-CM

## 2020-05-13 DIAGNOSIS — Z20822 Contact with and (suspected) exposure to covid-19: Secondary | ICD-10-CM | POA: Diagnosis present

## 2020-05-13 DIAGNOSIS — I161 Hypertensive emergency: Secondary | ICD-10-CM

## 2020-05-13 DIAGNOSIS — I639 Cerebral infarction, unspecified: Secondary | ICD-10-CM

## 2020-05-13 DIAGNOSIS — G8929 Other chronic pain: Secondary | ICD-10-CM | POA: Diagnosis present

## 2020-05-13 DIAGNOSIS — M6281 Muscle weakness (generalized): Secondary | ICD-10-CM | POA: Diagnosis not present

## 2020-05-13 DIAGNOSIS — R531 Weakness: Secondary | ICD-10-CM | POA: Diagnosis not present

## 2020-05-13 DIAGNOSIS — I10 Essential (primary) hypertension: Secondary | ICD-10-CM | POA: Diagnosis present

## 2020-05-13 DIAGNOSIS — R42 Dizziness and giddiness: Secondary | ICD-10-CM | POA: Diagnosis not present

## 2020-05-13 DIAGNOSIS — R41841 Cognitive communication deficit: Secondary | ICD-10-CM | POA: Diagnosis not present

## 2020-05-13 DIAGNOSIS — I350 Nonrheumatic aortic (valve) stenosis: Secondary | ICD-10-CM | POA: Diagnosis not present

## 2020-05-13 LAB — BASIC METABOLIC PANEL
Anion gap: 13 (ref 5–15)
BUN: 20 mg/dL (ref 8–23)
CO2: 23 mmol/L (ref 22–32)
Calcium: 9.8 mg/dL (ref 8.9–10.3)
Chloride: 105 mmol/L (ref 98–111)
Creatinine, Ser: 0.94 mg/dL (ref 0.44–1.00)
GFR calc Af Amer: >60 mL/min (ref >60)
GFR calc non Af Amer: >60 mL/min (ref >60)
Glucose, Bld: 136 mg/dL — ABNORMAL HIGH (ref 70–99)
Potassium: 3.2 mmol/L — ABNORMAL LOW (ref 3.5–5.1)
Sodium: 141 mmol/L (ref 135–145)

## 2020-05-13 LAB — LIPID PANEL
Cholesterol: 106 mg/dL (ref 0–200)
HDL: 23 mg/dL — ABNORMAL LOW (ref >40)
LDL Cholesterol: 49 mg/dL (ref 0–99)
Total CHOL/HDL Ratio: 4.6 RATIO
Triglycerides: 168 mg/dL — ABNORMAL HIGH (ref <150)
VLDL: 34 mg/dL (ref 0–40)

## 2020-05-13 LAB — URINALYSIS, ROUTINE W REFLEX MICROSCOPIC
Bacteria, UA: NONE SEEN
Bilirubin Urine: NEGATIVE
Glucose, UA: NEGATIVE mg/dL
Hgb urine dipstick: NEGATIVE
Ketones, ur: NEGATIVE mg/dL
Leukocytes,Ua: NEGATIVE
Nitrite: NEGATIVE
Protein, ur: 100 mg/dL — AB
Specific Gravity, Urine: 1.013 (ref 1.005–1.030)
pH: 7 (ref 5.0–8.0)

## 2020-05-13 LAB — SARS CORONAVIRUS 2 BY RT PCR (HOSPITAL ORDER, PERFORMED IN ~~LOC~~ HOSPITAL LAB): SARS Coronavirus 2: NEGATIVE

## 2020-05-13 LAB — HEMOGLOBIN A1C
Hgb A1c MFr Bld: 5.6 % (ref 4.8–5.6)
Mean Plasma Glucose: 114.02 mg/dL

## 2020-05-13 LAB — CBC
HCT: 40.8 % (ref 36.0–46.0)
Hemoglobin: 12.7 g/dL (ref 12.0–15.0)
MCH: 25.9 pg — ABNORMAL LOW (ref 26.0–34.0)
MCHC: 31.1 g/dL (ref 30.0–36.0)
MCV: 83.1 fL (ref 80.0–100.0)
Platelets: 272 10*3/uL (ref 150–400)
RBC: 4.91 MIL/uL (ref 3.87–5.11)
RDW: 16.9 % — ABNORMAL HIGH (ref 11.5–15.5)
WBC: 6.3 10*3/uL (ref 4.0–10.5)
nRBC: 0 % (ref 0.0–0.2)

## 2020-05-13 LAB — PROTIME-INR
INR: 1 (ref 0.8–1.2)
Prothrombin Time: 13.1 seconds (ref 11.4–15.2)

## 2020-05-13 MED ORDER — ACETAMINOPHEN 650 MG RE SUPP: 650.0000 mg | RECTAL | Status: DC | PRN

## 2020-05-13 MED ORDER — CLEVIDIPINE BUTYRATE 0.5 MG/ML IV EMUL
0.0000 mg/h | INTRAVENOUS | Status: DC
  Administered 2020-05-13: 1 mg/h via INTRAVENOUS
  Filled 2020-05-13: qty 50

## 2020-05-13 MED ORDER — CLEVIDIPINE BUTYRATE 0.5 MG/ML IV EMUL
0.0000 mg/h | INTRAVENOUS | Status: DC
  Administered 2020-05-13: 1 mg/h via INTRAVENOUS
  Administered 2020-05-14: 7 mg/h via INTRAVENOUS
  Administered 2020-05-14: 4 mg/h via INTRAVENOUS
  Administered 2020-05-14: 10 mg/h via INTRAVENOUS
  Administered 2020-05-15: 14 mg/h via INTRAVENOUS
  Administered 2020-05-15: 8 mg/h via INTRAVENOUS
  Administered 2020-05-15: 14 mg/h via INTRAVENOUS
  Filled 2020-05-13 (×4): qty 50
  Filled 2020-05-13: qty 100
  Filled 2020-05-13 (×3): qty 50

## 2020-05-13 MED ORDER — LABETALOL HCL 5 MG/ML IV SOLN
10.0000 mg | Freq: Once | INTRAVENOUS | Status: AC
  Administered 2020-05-13: 10 mg via INTRAVENOUS
  Filled 2020-05-13: qty 4

## 2020-05-13 MED ORDER — SODIUM CHLORIDE 0.9 % IV BOLUS
500.0000 mL | Freq: Once | INTRAVENOUS | Status: AC
  Administered 2020-05-13: 500 mL via INTRAVENOUS

## 2020-05-13 MED ORDER — POTASSIUM CHLORIDE CRYS ER 20 MEQ PO TBCR: 40.0000 meq | EXTENDED_RELEASE_TABLET | Freq: Two times a day (BID) | ORAL | Status: DC

## 2020-05-13 MED ORDER — LABETALOL HCL 5 MG/ML IV SOLN
20.0000 mg | Freq: Once | INTRAVENOUS | Status: AC
  Administered 2020-05-13: 20 mg via INTRAVENOUS
  Filled 2020-05-13: qty 4

## 2020-05-13 MED ORDER — STROKE: EARLY STAGES OF RECOVERY BOOK
Freq: Once | Status: DC
  Filled 2020-05-13: qty 1

## 2020-05-13 MED ORDER — IOHEXOL 350 MG/ML SOLN
80.0000 mL | Freq: Once | INTRAVENOUS | Status: AC | PRN
  Administered 2020-05-13: 80 mL via INTRAVENOUS

## 2020-05-13 MED ORDER — POTASSIUM CHLORIDE CRYS ER 20 MEQ PO TBCR
40.0000 meq | EXTENDED_RELEASE_TABLET | Freq: Once | ORAL | Status: AC
  Administered 2020-05-13: 40 meq via ORAL
  Filled 2020-05-13: qty 2

## 2020-05-13 MED ORDER — ACETAMINOPHEN 325 MG PO TABS: 650.0000 mg | ORAL_TABLET | ORAL | Status: DC | PRN

## 2020-05-13 MED ORDER — POTASSIUM CHLORIDE 10 MEQ/100ML IV SOLN
10.0000 meq | INTRAVENOUS | Status: AC
  Administered 2020-05-13 – 2020-05-14 (×4): 10 meq via INTRAVENOUS
  Filled 2020-05-13 (×4): qty 100

## 2020-05-13 MED ORDER — ACETAMINOPHEN 160 MG/5ML PO SOLN: 650.0000 mg | ORAL | Status: DC | PRN

## 2020-05-13 MED ORDER — PANTOPRAZOLE SODIUM 40 MG IV SOLR
40.0000 mg | Freq: Every day | INTRAVENOUS | Status: DC
  Administered 2020-05-13 – 2020-05-14 (×2): 40 mg via INTRAVENOUS
  Filled 2020-05-13 (×2): qty 40

## 2020-05-13 MED ORDER — SENNOSIDES-DOCUSATE SODIUM 8.6-50 MG PO TABS
1.0000 | ORAL_TABLET | Freq: Two times a day (BID) | ORAL | Status: DC
  Administered 2020-05-15 – 2020-05-17 (×4): 1 via ORAL
  Filled 2020-05-13 (×5): qty 1

## 2020-05-13 NOTE — ED Triage Notes (Signed)
Pt states she is been having difficulty walking and dizziness since yesterday at 8 am, pt said she usually is able to walk with the walker but now she is not able to do that either. Pt is AO x 4 no neuro deficit noticed on triage.

## 2020-05-13 NOTE — ED Notes (Signed)
Pt ambulated with assistance, pt gait unsteady & not able to walk far.

## 2020-05-13 NOTE — ED Notes (Signed)
Son of pt states pt has not taken bp meds in 2-3 days.

## 2020-05-13 NOTE — ED Provider Notes (Signed)
[facility] EMERGENCY DEPARTMENT Provider Note   CSN: [id number] Arrival date & time: 05/13/20  1218     History Chief Complaint  Patient presents with  . Dizziness    Morgan Bell is a 69 y.o. female.  HPI     69 year old female comes in a chief complaint of dizziness.  She has history of CAD, migraines, hypertension and mini strokes.  Patient reports that yesterday she started having dizziness.  Dizziness is described as spinning sensation.  Her symptoms are constant yesterday.  She was unable to walk because of it.  Today the dizziness is intermittent, only present when she is walking.  At rest, or when she is laying down she does not feel dizziness.  Her symptoms are not reproduced with movement of her neck.  With that she has right-sided upper extremity weakness.  She denies any new numbness or tingling.  She also denies any vision changes.   Past Medical History:  Diagnosis Date  . Arthritis    RA  . Exertional angina ([place]) 07/16/2018   CT coronary 09/13/18: Coronary Ca score 62.1, 68% for age/sex matched control, No evidence of obstructive CAD (1-24% LAD, RCA)  . Family history of adverse reaction to anesthesia    son has difficulty waking   . Headache(784.0)    HX MIGRAINES   . Hypertension   . Melanoma ([place])   . Obesity   . Stroke [facility])    MINI STROKE     21 YRS AGO     Patient Active Problem List   Diagnosis Date Noted  . ICH (intracerebral hemorrhage) ([place]) 05/13/2020  . Lumbar stenosis with neurogenic claudication 10/18/2019  . Exertional angina ([facility]) 07/16/2018  . Dyslipidemia 10/24/2016  . TIA (transient ischemic attack) 01/02/2015  . Rheumatoid arthritis ([place]) 01/02/2015  . HTN (hypertension) 04/10/2014  . Melanoma of trunk ([place]) 01/31/2013    Past Surgical History:  Procedure Laterality Date  . APPLICATION OF ROBOTIC ASSISTANCE FOR SPINAL PROCEDURE N/A 10/18/2019   Procedure: APPLICATION OF ROBOTIC ASSISTANCE FOR SPINAL  PROCEDURE;  Surgeon: Kristeen Miss, MD;  Location: [place];  Service: Neurosurgery;  Laterality: N/A;  . CESAREAN SECTION  11/26/1973  . CESAREAN SECTION  09/22/1975  . CESAREAN SECTION  04/30/1978  . HEMORRHOID SURGERY  06/1987  . MELANOMA EXCISION WITH SENTINEL LYMPH NODE BIOPSY Right 03/02/2013   Procedure: Wide MELANOMA EXCISION Right Abdominal wall WITH SENTINEL LYMPH NODE mapping Right Axilla;  Surgeon: Merrie Roof, MD;  Location: [place];  Service: General;  Laterality: Right;  . NM [place]  2003   negative Bruce protocol exercise stress test with no evidence of perfusion abnormality, EF 66%  . TOTAL HIP ARTHROPLASTY Right 03/2011     OB History   No obstetric history on file.     Family History  Problem Relation Age of Onset  . Alzheimer's disease Mother   . Heart disease Father        MI, died at 73  . Alzheimer's disease Maternal Grandmother   . Cancer Maternal Grandfather        lung  . Heart attack Paternal Grandfather   . Tuberculosis Sister     Social History   Tobacco Use  . Smoking status: Never Smoker  . Smokeless tobacco: Never Used  Substance Use Topics  . Alcohol use: No  . Drug use: No    Home Medications Prior to Admission medications   Medication Sig Start Date End  Date Taking? Authorizing Provider  amLODipine (NORVASC) 10 MG tablet Take 0.5 tablets (5 mg total) by mouth 2 (two) times daily. Patient not taking: Reported on 03/21/2020 12/15/19   Skeet Latch, MD  amoxicillin (AMOXIL) 500 MG tablet Take 2,000 mg by mouth See admin instructions. Take 4 capsules (2000 mg) by mouth 1 hour prior to dental appointments    [provider]  aspirin EC 81 MG tablet Take 81 mg daily by mouth.    [provider]  cetirizine (ZYRTEC) 10 MG tablet Take 10 mg by mouth daily.    [provider]  fenofibrate (TRICOR) 145 MG tablet TAKE 1 TABLET BY MOUTH DAILY Patient not taking: Reported on 03/21/2020 11/17/19   Skeet Latch, MD  Ferrous Sulfate (IRON SLOW RELEASE) 143 (45 Fe) MG TBCR Take 1 tablet daily by mouth.    [provider]  gabapentin (NEURONTIN) 100 MG capsule Take 100 mg by mouth 4 (four) times daily. 09/26/19   [provider]  HYDROcodone-acetaminophen (NORCO/VICODIN) 5-325 MG tablet Take 1-2 tablets by mouth every 3 (three) hours as needed for moderate pain or severe pain. 10/20/19   Kristeen Miss, MD  irbesartan (AVAPRO) 300 MG tablet TAKE 1 TABLET BY MOUTH EVERY DAY 05/08/20   Skeet Latch, MD  leflunomide (ARAVA) 20 MG tablet Take 20 mg daily by mouth.    [provider]  meloxicam (MOBIC) 15 MG tablet Take 15 mg by mouth daily as needed for pain.     [provider]  potassium chloride SA (K-DUR,KLOR-CON) 20 MEQ tablet Take 2 tablets (40 mEq total) by mouth daily. Patient not taking: Reported on 03/21/2020 01/14/19   Jearld Fenton, NP  rosuvastatin (CRESTOR) 40 MG tablet TAKE 1 TABLET BY MOUTH EVERY DAY Patient not taking: Reported on 03/21/2020 10/10/19   Skeet Latch, MD    Allergies    Sulfa antibiotics  Review of Systems   Review of Systems  Constitutional: Positive for activity change.  Eyes: Negative for visual disturbance.  Gastrointestinal: Positive for abdominal pain.  Neurological: Positive for dizziness and weakness.  Hematological: Does not bruise/bleed easily.  All other systems reviewed and are negative.   Physical Exam Updated Vital Signs BP (!) 173/87   Pulse 93   Temp 98.1 F (36.7 C) (Oral)   Resp 18   SpO2 97%   Physical Exam Vitals and nursing note reviewed.  Constitutional:      Appearance: She is well-developed.  HENT:     Head: Normocephalic and atraumatic.  Eyes:     Extraocular Movements: Extraocular movements intact.     Pupils: Pupils are equal, round, and reactive to light.  Cardiovascular:     Rate and Rhythm: Normal rate.  Pulmonary:     Effort: Pulmonary effort is normal.  Abdominal:      General: Bowel sounds are normal.  Musculoskeletal:     Cervical back: Normal range of motion and neck supple.  Skin:    General: Skin is warm and dry.  Neurological:     Mental Status: She is alert and oriented to person, place, and time.     Comments: Her drift for the right upper extremity, cerebellar exam did not reveal dysmetria     ED Results / Procedures / Treatments   Labs (all labs ordered are listed, but only abnormal results are displayed) Labs Reviewed  BASIC METABOLIC PANEL - Abnormal; Notable for the following components:      Result Value   Potassium  3.2 (*)    Glucose, Bld 136 (*)    All other components within normal limits  CBC - Abnormal; Notable for the following components:   MCH 25.9 (*)    RDW 16.9 (*)    All other components within normal limits  URINALYSIS, ROUTINE W REFLEX MICROSCOPIC - Abnormal; Notable for the following components:   Protein, ur 100 (*)    All other components within normal limits  SARS CORONAVIRUS 2 BY RT PCR (HOSPITAL ORDER, [place] [facility])  LIPID PANEL  HEMOGLOBIN A1C  PROTIME-INR  CBG MONITORING, ED    EKG EKG Interpretation  Date/Time:  Sunday May 13 2020 12:57:59 EDT Ventricular Rate:  96 PR Interval:  154 QRS Duration: 108 QT Interval:  386 QTC Calculation: 487 R Axis:   -42 Text Interpretation: Normal sinus rhythm Left axis deviation Left ventricular hypertrophy with repolarization abnormality ( R in aVL , Cornell product , Romhilt-Estes ) Abnormal ECG No acute changes No significant change since last tracing Confirmed by Varney Biles [phone]) on 05/13/2020 6:56:16 PM   Radiology CT Head Wo Contrast  Result Date: 05/13/2020 CLINICAL DATA:  Nonspecific dizziness.  Difficulty walking. EXAM: CT HEAD WITHOUT CONTRAST TECHNIQUE: Contiguous axial images were obtained from the base of the skull through the vertex without intravenous contrast. COMPARISON:  None. FINDINGS: Brain: Acute right  cerebellar hemorrhage measures 1.2 x 1.2 x 1.7 cm (volume = 1.3 cm^3). Slight surrounding mass effect/edema. No significant midline shift. The basilar cisterns remain patent. There is no subarachnoid or intraventricular extension generalized atrophy and mild chronic small vessel ischemia. No subdural or extra-axial collection. Vascular: Atherosclerosis of skullbase vasculature without hyperdense vessel or abnormal calcification. Skull: No fracture or focal lesion. Sinuses/Orbits: Lobulated mucosal thickening involving the left greater than right maxillary sinuses. Scattered mucosal thickening of ethmoid air cells and inferior frontal sinuses. Mastoid air cells are clear. Included orbits are unremarkable. Other: None. IMPRESSION: 1. Acute right cerebellar hemorrhage measuring 1.2 x 1.2 x 1.7 cm. Slight surrounding mass effect/edema. No significant midline shift. The basilar cisterns remain patent. 2. Generalized atrophy and chronic small vessel ischemia. 3. Paranasal sinus disease. Critical Value/emergent results were called by telephone at the time of interpretation on 05/13/2020 at 9:11 pm to Dr Varney Biles , who verbally acknowledged these results. Electronically Signed   By: Keith Rake M.D.   On: 05/13/2020 21:11    Procedures .Critical Care Performed by: Varney Biles, MD Authorized by: Varney Biles, MD   Critical care provider statement:    Critical care time (minutes):  52   Critical care was necessary to treat or prevent imminent or life-threatening deterioration of the following conditions:  CNS failure or compromise   Critical care was time spent personally by me on the following activities:  Discussions with consultants, evaluation of patient's response to treatment, examination of patient, ordering and performing treatments and interventions, ordering and review of laboratory studies, ordering and review of radiographic studies, pulse oximetry, re-evaluation of patient's condition,  obtaining history from patient or surrogate and review of old charts   (including critical care time)  Medications Ordered in ED Medications   stroke: mapping our early stages of recovery book (has no administration in time range)  acetaminophen (TYLENOL) tablet 650 mg (has no administration in time range)    Or  acetaminophen (TYLENOL) 160 MG/5ML solution 650 mg (has no administration in time range)    Or  acetaminophen (TYLENOL) suppository 650 mg (has no administration in time  range)  senna-docusate (Senokot-S) tablet 1 tablet (1 tablet Oral Refused 05/13/20 2236)  pantoprazole (PROTONIX) injection 40 mg (40 mg Intravenous Given 05/13/20 2231)  labetalol (NORMODYNE) injection 20 mg (20 mg Intravenous Given 05/13/20 2230)    And  clevidipine (CLEVIPREX) infusion 0.5 mg/mL (has no administration in time range)  potassium chloride 10 mEq in 100 mL IVPB (has no administration in time range)  potassium chloride SA (KLOR-CON) CR tablet 40 mEq (has no administration in time range)  sodium chloride 0.9 % bolus 500 mL (0 mLs Intravenous Stopped 05/13/20 2125)  labetalol (NORMODYNE) injection 10 mg (10 mg Intravenous Given 05/13/20 2158)  iohexol (OMNIPAQUE) 350 MG/ML injection 80 mL (80 mLs Intravenous Contrast Given 05/13/20 2216)    ED Course  I have reviewed the triage vital signs and the nursing notes.  Pertinent labs & imaging results that were available during my care of the patient were reviewed by me and considered in my medical decision making (see chart for details).  Clinical Course as of May 13 2256  Nancy Fetter May 13, 2020  2256 CT scan of the head confirms cerebellar stroke.  It is a hemorrhagic stroke.  Neurology consulted. Results of the ER work-up discussed with the patient.  During my assessment patient's BP is in the 150s.  RN has been notified to inform me once the blood pressure gets over 160.  Once the BP went over 160 we started the patient on Cleviprex drip.  CT Head Wo Contrast  [AN]    Clinical Course User Index [AN] Varney Biles, MD   MDM Rules/Calculators/A&P                          69 year old comes in a chief complaint of dizziness. Dizziness was constantly present yesterday, today she only notices when she tries to walk.  She is unable to ambulate because of her dizziness.  Dizziness is described as vertigo and she is noted to have right upper extremity weakness (mild).  Clinical concerns are for cerebellar stroke, tumor.  Peripheral vertigo also possible.  We will start with CT scan and basic labs.  Final Clinical Impression(s) / ED Diagnoses Final diagnoses:  Cerebellar stroke ([place])  Hemorrhagic stroke [facility])    Rx / DC Orders ED Discharge Orders    None       Varney Biles, MD 05/13/20 2258

## 2020-05-13 NOTE — H&P (Signed)
Stroke neurology-H&P-ICH  CC: Dizziness  History is obtained from: Patient, chart  HPI: Morgan Bell is a 69 y.o. female past medical history of hypertension with questionable compliance to medication, headaches, angina, rheumatoid arthritis, TIA 21 years ago with no residual deficits, presenting to the emergency room for sudden onset of dizziness and difficulty getting out of bed and walking. She reports that she woke up normally on the morning of Saturday, May 12, 2020 and as she was waiting to go to the bathroom and watching some TV, she had sudden onset of dizziness-which she describes initially as lightheadedness followed by a sensation of room spinning that would not go away and would make walking difficult.  She stayed in bed and did not take her medications.  When this did not improve over the next day, she called her son who brought her to the emergency room.  She was evaluated in the emergency room and a noncontrast head CT was done which revealed a intraparenchymal bleed in the right cerebellum with mild surrounding mass-effect with no significant midline shift.  Basilar cisterns patent. Continues to have dizziness and mild head discomfort but no severe headaches.  Denies any visual changes or diplopia associated with the symptom onset.  Denies any speech problems specifically no dysarthria associated with the other symptoms. Denies any tingling numbness or weakness.  Reports gait instability while walking.  Denies chest pain shortness of breath nausea vomiting.  Reports chronic back pain. Systolic blood pressures greater than 140s on arrival.  Currently was running at 170s.  LKW: 8 AM on 05/12/2020 tpa given?: no, ICH Premorbid modified Rankin scale (mRS): 1 ICH score: 1 ROS performed and negative except as noted in HPI.  Past Medical History:  Diagnosis Date  . Arthritis    RA  . Exertional angina ([place]) 07/16/2018   CT coronary 09/13/18: Coronary Ca score 62.1, 68% for age/sex  matched control, No evidence of obstructive CAD (1-24% LAD, RCA)  . Family history of adverse reaction to anesthesia    son has difficulty waking   . Headache(784.0)    HX MIGRAINES   . Hypertension   . Melanoma ([place])   . Obesity   . Stroke [facility])    MINI STROKE     21 YRS AGO      Family History  Problem Relation Age of Onset  . Alzheimer's disease Mother   . Heart disease Father        MI, died at 42  . Alzheimer's disease Maternal Grandmother   . Cancer Maternal Grandfather        lung  . Heart attack Paternal Grandfather   . Tuberculosis Sister      Social History:   reports that she has never smoked. She has never used smokeless tobacco. She reports that she does not drink alcohol and does not use drugs.   Medications  Current Facility-Administered Medications:  .   stroke: mapping our early stages of recovery book, , Does not apply, Once, Amie Portland, MD .  acetaminophen (TYLENOL) tablet 650 mg, 650 mg, Oral, Q4H PRN **OR** acetaminophen (TYLENOL) 160 MG/5ML solution 650 mg, 650 mg, Per Tube, Q4H PRN **OR** acetaminophen (TYLENOL) suppository 650 mg, 650 mg, Rectal, Q4H PRN, Amie Portland, MD .  labetalol (NORMODYNE) injection 20 mg, 20 mg, Intravenous, Once **AND** clevidipine (CLEVIPREX) infusion 0.5 mg/mL, 0-21 mg/hr, Intravenous, Continuous, Amie Portland, MD .  pantoprazole (PROTONIX) injection 40 mg, 40 mg, Intravenous, QHS, Amie Portland, MD .  senna-docusate (Senokot-S)  tablet 1 tablet, 1 tablet, Oral, BID, Amie Portland, MD  Current Outpatient Medications:  .  amLODipine (NORVASC) 10 MG tablet, Take 0.5 tablets (5 mg total) by mouth 2 (two) times daily. (Patient not taking: Reported on 03/21/2020), Disp: 90 tablet, Rfl: 1 .  amoxicillin (AMOXIL) 500 MG tablet, Take 2,000 mg by mouth See admin instructions. Take 4 capsules (2000 mg) by mouth 1 hour prior to dental appointments, Disp: , Rfl:  .  aspirin EC 81 MG tablet, Take 81 mg daily by mouth., Disp: , Rfl:   .  cetirizine (ZYRTEC) 10 MG tablet, Take 10 mg by mouth daily., Disp: , Rfl:  .  fenofibrate (TRICOR) 145 MG tablet, TAKE 1 TABLET BY MOUTH DAILY (Patient not taking: Reported on 03/21/2020), Disp: 30 tablet, Rfl: 11 .  Ferrous Sulfate (IRON SLOW RELEASE) 143 (45 Fe) MG TBCR, Take 1 tablet daily by mouth., Disp: , Rfl:  .  gabapentin (NEURONTIN) 100 MG capsule, Take 100 mg by mouth 4 (four) times daily., Disp: , Rfl:  .  HYDROcodone-acetaminophen (NORCO/VICODIN) 5-325 MG tablet, Take 1-2 tablets by mouth every 3 (three) hours as needed for moderate pain or severe pain., Disp: 30 tablet, Rfl: 0 .  irbesartan (AVAPRO) 300 MG tablet, TAKE 1 TABLET BY MOUTH EVERY DAY, Disp: 30 tablet, Rfl: 0 .  leflunomide (ARAVA) 20 MG tablet, Take 20 mg daily by mouth., Disp: , Rfl:  .  meloxicam (MOBIC) 15 MG tablet, Take 15 mg by mouth daily as needed for pain. , Disp: , Rfl:  .  potassium chloride SA (K-DUR,KLOR-CON) 20 MEQ tablet, Take 2 tablets (40 mEq total) by mouth daily. (Patient not taking: Reported on 03/21/2020), Disp: 60 tablet, Rfl: 1 .  rosuvastatin (CRESTOR) 40 MG tablet, TAKE 1 TABLET BY MOUTH EVERY DAY (Patient not taking: Reported on 03/21/2020), Disp: 30 tablet, Rfl: 5  Exam: Current vital signs: BP (!) 173/87   Pulse 93   Temp 98.1 F (36.7 C) (Oral)   Resp 18   SpO2 97%  Vital signs in last 24 hours: Temp:  [97.6 F (36.4 C)-98.1 F (36.7 C)] 98.1 F (36.7 C) (08/01 1724) Pulse Rate:  [85-100] 93 (08/01 2000) Resp:  [13-18] 18 (08/01 2000) BP: (146-175)/(87-116) 173/87 (08/01 2135) SpO2:  [94 %-97 %] 97 % (08/01 2000) General: Awake alert in no distress HEENT: Normocephalic atraumatic Lungs: Clear Cardiovascular: Regular rhythm Abdomen soft nontender nondistended Extremities warm well perfused Neurologic exam Awake alert oriented x3 Speech is not dysarthric No evidence of aphasia Cranial nerves: Pupils equal round react light, extract movements intact, visual fields full, no  facial asymmetry, facial sensation intact, hearing intact, tongue uvula palate are midline, sternocleidomastoid and trapezius strength is normal, no evidence of tongue atrophy or fibrillations. Motor exam: Symmetric antigravity strength in all 4 extremities without vertical drift. Sensation intact light touch without extinction bilaterally Coordination: No dysmetria bilaterally Gait testing deferred at this time NIH stroke scale-0  Labs I have reviewed labs in epic and the results pertinent to this consultation are:  CBC    Component Value Date/Time   WBC 6.3 05/13/2020 1310   RBC 4.91 05/13/2020 1310   HGB 12.7 05/13/2020 1310   HCT 40.8 05/13/2020 1310   PLT 272 05/13/2020 1310   MCV 83.1 05/13/2020 1310   MCV 93.1 01/16/2014 2105   MCH 25.9 (L) 05/13/2020 1310   MCHC 31.1 05/13/2020 1310   RDW 16.9 (H) 05/13/2020 1310   LYMPHSABS 2.3 02/28/2013 1545   MONOABS 1.0  02/28/2013 1545   EOSABS 0.3 02/28/2013 1545   BASOSABS 0.1 02/28/2013 1545    CMP     Component Value Date/Time   NA 141 05/13/2020 1310   NA 142 04/23/2018 0829   K 3.2 (L) 05/13/2020 1310   CL 105 05/13/2020 1310   CO2 23 05/13/2020 1310   GLUCOSE 136 (H) 05/13/2020 1310   BUN 20 05/13/2020 1310   BUN 24 04/23/2018 0829   CREATININE 0.94 05/13/2020 1310   CREATININE 1.45 (H) 11/10/2016 1418   CALCIUM 9.8 05/13/2020 1310   PROT 6.0 03/21/2020 1456   PROT 6.8 04/23/2018 0829   ALBUMIN 3.8 03/21/2020 1456   ALBUMIN 4.2 04/23/2018 0829   AST 23 03/21/2020 1456   ALT 8 03/21/2020 1456   ALKPHOS 116 03/21/2020 1456   BILITOT 0.6 03/21/2020 1456   BILITOT 0.5 04/23/2018 0829   GFRNONAA >60 05/13/2020 1310   GFRAA >60 05/13/2020 1310     Imaging I have reviewed the images obtained  CT head without contrast-acute right cerebellar hemorrhage measuring 1.2 x 1.2 x 1.7 cm with slight surrounding mass-effect and edema with no significant midline shift.  The basilar cisterns remain patent.  Generalized  atrophy and chronic small vessel ischemia noted.  Paranasal sinus disease noted.  Assessment:  69 year old woman with above past medical history presenting for evaluation of sudden onset of dizziness and noted to have a right cerebellar ICH, likely hypertensive in nature. Other differentials include underlying vascular malformation. Not a common site for aneurysmal bleed.  Plan: Cerebellum ICH, nontraumatic Acuity: Acute Laterality: Right cerebellum Current suspected etiology: Likely hypertension Treatment: -Admit to neurological ICU -ICH Score: 1 -ICH Volume: Less than 30 cc -BP control goal SYS<140 -PT/OT/ST  -neuromonitoring -CTA head/neck  CNS Cerebral edema Compression of brain Cerebellar hemorrhage -Hold off on osmolar therapy for now -Repeat imaging tomorrow morning PT OT speech therapy   RESP No active issues  CV Essential hypertension Hypertensive emergency -Systolic blood pressure goal below 140. -Use labetalol IV or hydralazine IV as needed and if needed use Cleviprex drip -Transthoracic echo   GI/GU No acute issues  HEME No acute issues Monitor CBC Transfuse if hemoglobin less than 7 Check PT/INR  ENDO Hyperglycemia Blood sugars 1 40-1 80   Fluid/Electrolyte Disorders Hypokalemia -Replete -Check labs in the morning  ID No active issues   Prophylaxis DVT: Use SCDs.  No antiplatelets or anticoagulants GI: PPI Bowel: Docusate senna  Dispo: Pending PT OT evaluation  Diet: NPO until cleared by speech/bedside swallow screen  Code Status: Full Code    THE FOLLOWING WERE PRESENT ON ADMISSION: ICH, cerebral edema, hypertensive emergency, essential hypertension  -- Amie Portland, MD [facility] Pager: [phone] If 7pm to 7am, please call on call as listed on AMION.   CRITICAL CARE ATTESTATION Performed by: Amie Portland, MD Total critical care time: 35 minutes Critical care time was exclusive of separately billable  procedures and treating other patients and/or supervising APPs/Residents/Students Critical care was necessary to treat or prevent imminent or life-threatening deterioration due to intracerebral hemorrhage, hypertensive emergency This patient is critically ill and at significant risk for neurological worsening and/or death and care requires constant monitoring. Critical care was time spent personally by me on the following activities: development of treatment plan with patient and/or surrogate as well as nursing, discussions with consultants, evaluation of patient's response to treatment, examination of patient, obtaining history from patient or surrogate, ordering and performing treatments and interventions, ordering and review of laboratory studies, ordering  and review of radiographic studies, pulse oximetry, re-evaluation of patient's condition, participation in multidisciplinary rounds and medical decision making of high complexity in the care of this patient.

## 2020-05-14 ENCOUNTER — Inpatient Hospital Stay (HOSPITAL_COMMUNITY): Payer: Medicare Other

## 2020-05-14 DIAGNOSIS — I614 Nontraumatic intracerebral hemorrhage in cerebellum: Principal | ICD-10-CM

## 2020-05-14 LAB — MRSA PCR SCREENING: MRSA by PCR: NEGATIVE

## 2020-05-14 LAB — CBG MONITORING, ED: Glucose-Capillary: 95 mg/dL (ref 70–99)

## 2020-05-14 MED ORDER — LEFLUNOMIDE 20 MG PO TABS
20.0000 mg | ORAL_TABLET | Freq: Every day | ORAL | Status: DC
  Administered 2020-05-14 – 2020-05-17 (×4): 20 mg via ORAL
  Filled 2020-05-14 (×4): qty 1

## 2020-05-14 MED ORDER — POTASSIUM CHLORIDE CRYS ER 20 MEQ PO TBCR
20.0000 meq | EXTENDED_RELEASE_TABLET | Freq: Every day | ORAL | Status: DC
  Administered 2020-05-15 – 2020-05-17 (×3): 20 meq via ORAL
  Filled 2020-05-14 (×3): qty 1

## 2020-05-14 MED ORDER — CHLORHEXIDINE GLUCONATE CLOTH 2 % EX PADS
6.0000 | MEDICATED_PAD | Freq: Every day | CUTANEOUS | Status: DC
  Administered 2020-05-14 – 2020-05-15 (×2): 6 via TOPICAL

## 2020-05-14 MED ORDER — CALCIUM CARBONATE-VITAMIN D 500-200 MG-UNIT PO TABS
1.0000 | ORAL_TABLET | Freq: Every day | ORAL | Status: DC
  Administered 2020-05-14 – 2020-05-17 (×4): 1 via ORAL
  Filled 2020-05-14 (×5): qty 1

## 2020-05-14 MED ORDER — GABAPENTIN 100 MG PO CAPS
100.0000 mg | ORAL_CAPSULE | Freq: Four times a day (QID) | ORAL | Status: DC
  Administered 2020-05-14 – 2020-05-17 (×12): 100 mg via ORAL
  Filled 2020-05-14 (×12): qty 1

## 2020-05-14 MED ORDER — AMLODIPINE BESYLATE 5 MG PO TABS
5.0000 mg | ORAL_TABLET | Freq: Two times a day (BID) | ORAL | Status: DC
  Administered 2020-05-14 – 2020-05-17 (×6): 5 mg via ORAL
  Filled 2020-05-14 (×6): qty 1

## 2020-05-14 MED ORDER — IRBESARTAN 300 MG PO TABS
300.0000 mg | ORAL_TABLET | Freq: Every day | ORAL | Status: DC
  Administered 2020-05-14 – 2020-05-17 (×4): 300 mg via ORAL
  Filled 2020-05-14: qty 1
  Filled 2020-05-14: qty 2
  Filled 2020-05-14 (×2): qty 1

## 2020-05-14 MED ORDER — GADOBUTROL 1 MMOL/ML IV SOLN
6.0000 mL | Freq: Once | INTRAVENOUS | Status: AC | PRN
  Administered 2020-05-14: 6 mL via INTRAVENOUS

## 2020-05-14 MED ORDER — LORATADINE 10 MG PO TABS
10.0000 mg | ORAL_TABLET | Freq: Every day | ORAL | Status: DC
  Administered 2020-05-14 – 2020-05-17 (×4): 10 mg via ORAL
  Filled 2020-05-14 (×4): qty 1

## 2020-05-14 NOTE — ED Notes (Signed)
Pt to CT with SWOT nurse.

## 2020-05-14 NOTE — ED Notes (Signed)
Pt placed on bedpan

## 2020-05-14 NOTE — ED Notes (Signed)
Lunch Tray Ordered @ 1151. 

## 2020-05-14 NOTE — Progress Notes (Signed)
STROKE TEAM PROGRESS NOTE   INTERVAL HISTORY I personally reviewed history of presenting illness with the patient, electronic medical records and imaging films in PACS.  She presented with sudden onset of dizziness and ataxia and CT scan shows a small right cerebellar hemorrhage without intraventricular extension or hydrocephalus.  CT angiogram of the brain and neck showed no large vessel stenosis occlusion or aneurysms.  Echocardiogram and MRI are pending at this time.  Blood pressure adequately controlled on Cleviprex drip. Vitals:   05/14/20 0921 05/14/20 1000 05/14/20 1014 05/14/20 1030  BP: (!) 149/95 (!) 126/87 (!) 142/83 (!) 117/94  Pulse: 85 79 70 72  Resp: 18 15 (!) 9 14  Temp:      TempSrc:      SpO2: 97% 97% 95% 97%   CBC:  Recent Labs  Lab 05/13/20 1310  WBC 6.3  HGB 12.7  HCT 40.8  MCV 83.1  PLT 638   Basic Metabolic Panel:  Recent Labs  Lab 05/13/20 1310  NA 141  K 3.2*  CL 105  CO2 23  GLUCOSE 136*  BUN 20  CREATININE 0.94  CALCIUM 9.8   Lipid Panel:  Recent Labs  Lab 05/13/20 2253  CHOL 106  TRIG 168*  HDL 23*  CHOLHDL 4.6  VLDL 34  LDLCALC 49   HgbA1c:  Recent Labs  Lab 05/13/20 2253  HGBA1C 5.6   Urine Drug Screen: No results for input(s): LABOPIA, COCAINSCRNUR, LABBENZ, AMPHETMU, THCU, LABBARB in the last 168 hours.  Alcohol Level No results for input(s): ETH in the last 168 hours.  IMAGING past 24 hours CT ANGIO HEAD W OR WO CONTRAST  Result Date: 05/13/2020 CLINICAL DATA:  Initial evaluation for difficulty with ambulation, dizziness. EXAM: CT ANGIOGRAPHY HEAD AND NECK TECHNIQUE: Multidetector CT imaging of the head and neck was performed using the standard protocol during bolus administration of intravenous contrast. Multiplanar CT image reconstructions and MIPs were obtained to evaluate the vascular anatomy. Carotid stenosis measurements (when applicable) are obtained utilizing NASCET criteria, using the distal internal carotid diameter  as the denominator. CONTRAST:  26mL OMNIPAQUE IOHEXOL 350 MG/ML SOLN COMPARISON:  Prior head CT from earlier same day. FINDINGS: CTA NECK FINDINGS Aortic arch: Partially visualized aortic arch normal in caliber. Origin of the great vessels incompletely visualized. Mild-to-moderate atheromatous change noted about the visualized arch itself. Right carotid system: Right CCA tortuous proximally but widely patent to the bifurcation. No significant atheromatous narrowing about the right bifurcation. Right ICA tortuous but widely patent without stenosis, dissection or occlusion. Left carotid system: Left CCA tortuous proximally but widely patent to the bifurcation without stenosis. Mild atheromatous change about the left bifurcation without stenosis. Left ICA tortuous but widely patent without stenosis, dissection or occlusion. Vertebral arteries: Both vertebral arteries arise from the subclavian arteries. Left vertebral artery dominant. No hemodynamically significant proximal subclavian artery stenosis. Vertebral arteries tortuous but widely patent within the neck without stenosis, dissection or occlusion. Skeleton: No acute osseous abnormality. No discrete or worrisome osseous lesions. Moderate multilevel cervical spondylosis. Other neck: No other acute soft tissue abnormality within the neck. No mass lesion or adenopathy. Upper chest: Visualized upper chest demonstrates no acute finding. Review of the MIP images confirms the above findings CTA HEAD FINDINGS Anterior circulation: Both internal carotid arteries widely patent to the termini without stenosis. Mild atheromatous change noted within the carotid siphons. A1 segments patent bilaterally. Right A1 hypoplastic, accounting for the diminutive right ICA is compared to the left. Normal anterior communicating artery  complex. Anterior cerebral arteries widely patent to their distal aspects. No M1 stenosis or occlusion. Normal MCA bifurcations. Distal MCA branches well  perfused and symmetric. Posterior circulation: Vertebral arteries widely patent to the vertebrobasilar junction without stenosis. Both picas patent. Basilar widely patent to its distal aspect without stenosis. Superior cerebral arteries patent bilaterally. Both PCAs primarily supplied via the basilar and are well perfused to their distal aspects. Venous sinuses: Grossly patent allowing for timing the contrast bolus. Anatomic variants: Hypoplastic right A1 segment. No intracranial aneurysm. No vascular abnormality seen underlying the acute cerebellar hemorrhage. Review of the MIP images confirms the above findings IMPRESSION: 1. Negative CTA for large vessel occlusion. No vascular abnormality seen underlying the acute cerebellar hemorrhage. 2. Mild atheromatous change about the aortic arch, carotid bifurcations, and carotid siphons without hemodynamically significant stenosis. 3. Diffuse tortuosity of the major arterial vasculature of the head and neck, suggesting chronic underlying hypertension. Electronically Signed   By: Jeannine Boga M.D.   On: 05/13/2020 23:19   CT Head Wo Contrast  Result Date: 05/13/2020 CLINICAL DATA:  Nonspecific dizziness.  Difficulty walking. EXAM: CT HEAD WITHOUT CONTRAST TECHNIQUE: Contiguous axial images were obtained from the base of the skull through the vertex without intravenous contrast. COMPARISON:  None. FINDINGS: Brain: Acute right cerebellar hemorrhage measures 1.2 x 1.2 x 1.7 cm (volume = 1.3 cm^3). Slight surrounding mass effect/edema. No significant midline shift. The basilar cisterns remain patent. There is no subarachnoid or intraventricular extension generalized atrophy and mild chronic small vessel ischemia. No subdural or extra-axial collection. Vascular: Atherosclerosis of skullbase vasculature without hyperdense vessel or abnormal calcification. Skull: No fracture or focal lesion. Sinuses/Orbits: Lobulated mucosal thickening involving the left greater than  right maxillary sinuses. Scattered mucosal thickening of ethmoid air cells and inferior frontal sinuses. Mastoid air cells are clear. Included orbits are unremarkable. Other: None. IMPRESSION: 1. Acute right cerebellar hemorrhage measuring 1.2 x 1.2 x 1.7 cm. Slight surrounding mass effect/edema. No significant midline shift. The basilar cisterns remain patent. 2. Generalized atrophy and chronic small vessel ischemia. 3. Paranasal sinus disease. Critical Value/emergent results were called by telephone at the time of interpretation on 05/13/2020 at 9:11 pm to Dr Varney Biles , who verbally acknowledged these results. Electronically Signed   By: Keith Rake M.D.   On: 05/13/2020 21:11   CT ANGIO NECK W OR WO CONTRAST  Result Date: 05/13/2020 CLINICAL DATA:  Initial evaluation for difficulty with ambulation, dizziness. EXAM: CT ANGIOGRAPHY HEAD AND NECK TECHNIQUE: Multidetector CT imaging of the head and neck was performed using the standard protocol during bolus administration of intravenous contrast. Multiplanar CT image reconstructions and MIPs were obtained to evaluate the vascular anatomy. Carotid stenosis measurements (when applicable) are obtained utilizing NASCET criteria, using the distal internal carotid diameter as the denominator. CONTRAST:  60mL OMNIPAQUE IOHEXOL 350 MG/ML SOLN COMPARISON:  Prior head CT from earlier same day. FINDINGS: CTA NECK FINDINGS Aortic arch: Partially visualized aortic arch normal in caliber. Origin of the great vessels incompletely visualized. Mild-to-moderate atheromatous change noted about the visualized arch itself. Right carotid system: Right CCA tortuous proximally but widely patent to the bifurcation. No significant atheromatous narrowing about the right bifurcation. Right ICA tortuous but widely patent without stenosis, dissection or occlusion. Left carotid system: Left CCA tortuous proximally but widely patent to the bifurcation without stenosis. Mild atheromatous  change about the left bifurcation without stenosis. Left ICA tortuous but widely patent without stenosis, dissection or occlusion. Vertebral arteries: Both vertebral arteries arise from  the subclavian arteries. Left vertebral artery dominant. No hemodynamically significant proximal subclavian artery stenosis. Vertebral arteries tortuous but widely patent within the neck without stenosis, dissection or occlusion. Skeleton: No acute osseous abnormality. No discrete or worrisome osseous lesions. Moderate multilevel cervical spondylosis. Other neck: No other acute soft tissue abnormality within the neck. No mass lesion or adenopathy. Upper chest: Visualized upper chest demonstrates no acute finding. Review of the MIP images confirms the above findings CTA HEAD FINDINGS Anterior circulation: Both internal carotid arteries widely patent to the termini without stenosis. Mild atheromatous change noted within the carotid siphons. A1 segments patent bilaterally. Right A1 hypoplastic, accounting for the diminutive right ICA is compared to the left. Normal anterior communicating artery complex. Anterior cerebral arteries widely patent to their distal aspects. No M1 stenosis or occlusion. Normal MCA bifurcations. Distal MCA branches well perfused and symmetric. Posterior circulation: Vertebral arteries widely patent to the vertebrobasilar junction without stenosis. Both picas patent. Basilar widely patent to its distal aspect without stenosis. Superior cerebral arteries patent bilaterally. Both PCAs primarily supplied via the basilar and are well perfused to their distal aspects. Venous sinuses: Grossly patent allowing for timing the contrast bolus. Anatomic variants: Hypoplastic right A1 segment. No intracranial aneurysm. No vascular abnormality seen underlying the acute cerebellar hemorrhage. Review of the MIP images confirms the above findings IMPRESSION: 1. Negative CTA for large vessel occlusion. No vascular abnormality  seen underlying the acute cerebellar hemorrhage. 2. Mild atheromatous change about the aortic arch, carotid bifurcations, and carotid siphons without hemodynamically significant stenosis. 3. Diffuse tortuosity of the major arterial vasculature of the head and neck, suggesting chronic underlying hypertension. Electronically Signed   By: Jeannine Boga M.D.   On: 05/13/2020 23:19    PHYSICAL EXAM Pleasant elderly Caucasian lady not in distress. . Afebrile. Head is nontraumatic. Neck is supple without bruit.    Cardiac exam no murmur or gallop. Lungs are clear to auscultation. Distal pulses are well felt. Neurological Exam ;  Awake  Alert oriented x 3. Normal speech and language.eye movements full without nystagmus.mild saccadic dysmetria on bilateral lateral gaze.  Fundi were not visualized. Vision acuity and fields appear normal. Hearing is normal. Palatal movements are normal. Face symmetric. Tongue midline. Normal strength, tone, reflexes and coordination. Normal sensation. Gait deferred.  ASSESSMENT/PLAN Morgan Bell is a 69 y.o. female with history of hypertension with questionable compliance to medication, headaches, angina, rheumatoid arthritis, TIA 21 years ago with no residual deficits presenting with dizziness and difficulty walking.   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 Small vessel disease. Atrophy. Sinus dz.  CTA head & neck no vascular abnormality. Mild atherosclerosis. Tortuosity.    MRI w/w/o R cerebellar hemorrhage w/ mild edema. No lesion. R basal ganglia and thalamic chronic microhemorrhages.  2D Echo status post   LDL 49  HgbA1c 5.6  VTE prophylaxis - SCDs   aspirin 81 mg daily prior to admission, now on No antithrombotic given hemorrhage   Therapy recommendations:  pending   Disposition:  pending   Admit to ICU  Hypertension  Home meds:  norvasc 10, avapro  300  Stable . SBP goal < 140 . Resume home meds . Wean cleviprex . Long-term BP goal normotensive  Hyperlipidemia  Home meds:  crestor 40 & fenofibrate 145  LDL 49  Hold statin in setting of acute hemorrhage   Resume statin at discharge  Other Stroke Risk Factors  Advanced age  Hx TIA  21 yrs ago w/o sequelae   Migraines  Other Active Problems  RA on [facility] day [facility] 1 She presented with dizziness and ataxia secondary to small right cerebellar hemorrhage likely of hypertensive etiology.  Recommend close neurological monitoring in the ICU and strict blood pressure control as per brain hemorrhage protocol.  Keep systolic blood pressure below 140 for 24 hours and then below 160.  Start oral blood pressure medications.  Check MRI scan of the brain with and without contrast.  Mobilize out of bed.  Long discussion patient and answered questions. This patient is critically ill and at significant risk of neurological worsening, death and care requires constant monitoring of vital signs, hemodynamics,respiratory and cardiac monitoring, extensive review of multiple databases, frequent neurological assessment, discussion with family, other specialists and medical decision making of high complexity.I have made any additions or clarifications directly to the above note.This critical care time does not reflect procedure time, or teaching time or supervisory time of PA/NP/Med Resident etc but could involve care discussion time.  I spent 32 minutes of neurocritical care time  in the care of  this patient. Antony Contras, MD    To contact Stroke Continuity provider, please refer to [email or web address] After hours, contact General Neurology

## 2020-05-14 NOTE — Progress Notes (Signed)
Attempted echo twice. Will do as schedule permits.

## 2020-05-15 ENCOUNTER — Inpatient Hospital Stay (HOSPITAL_COMMUNITY): Payer: Medicare Other

## 2020-05-15 DIAGNOSIS — I340 Nonrheumatic mitral (valve) insufficiency: Secondary | ICD-10-CM

## 2020-05-15 DIAGNOSIS — I350 Nonrheumatic aortic (valve) stenosis: Secondary | ICD-10-CM

## 2020-05-15 LAB — ECHOCARDIOGRAM COMPLETE
Area-P 1/2: 4.17 cm2
Calc EF: 38.6 %
Height: 60 in
S' Lateral: 3.13 cm
Single Plane A2C EF: 41.9 %
Single Plane A4C EF: 40.8 %

## 2020-05-15 MED ORDER — PANTOPRAZOLE SODIUM 40 MG PO TBEC
40.0000 mg | DELAYED_RELEASE_TABLET | Freq: Every day | ORAL | Status: DC
  Administered 2020-05-15 – 2020-05-16 (×2): 40 mg via ORAL
  Filled 2020-05-15 (×2): qty 1

## 2020-05-15 MED ORDER — POTASSIUM CHLORIDE CRYS ER 20 MEQ PO TBCR
40.0000 meq | EXTENDED_RELEASE_TABLET | Freq: Once | ORAL | Status: AC
  Administered 2020-05-15: 40 meq via ORAL
  Filled 2020-05-15: qty 2

## 2020-05-15 MED ORDER — LABETALOL HCL 5 MG/ML IV SOLN
10.0000 mg | INTRAVENOUS | Status: DC | PRN
  Administered 2020-05-15: 20 mg via INTRAVENOUS
  Filled 2020-05-15: qty 4

## 2020-05-15 NOTE — Progress Notes (Signed)
  Echocardiogram 2D Echocardiogram has been performed.  Morgan Bell 05/15/2020, 10:52 AM

## 2020-05-15 NOTE — Progress Notes (Signed)
Spoke with patient's son Collier Salina at the request of the pt. He was updated on pt's status and plan of care. He asked good questions and was appreciative of the call. We will keep family updated as her care continues. Ariyan Sinnett, Rande Brunt, RN

## 2020-05-15 NOTE — Evaluation (Signed)
Physical Therapy Evaluation Patient Details Name: Morgan Bell MRN: [id number] DOB: Dec 16, 1950 Today's Date: 05/15/2020   History of Present Illness  69 y.o. female past medical history of hypertension, headaches, angina, rheumatoid arthritis, TIA 21 years ago with no residual deficits, posterior decompression with stabilization from T10-L4 secondary to burst fracture from [place] 10/2018, admitted 8/1 for sudden onset of dizziness and difficulty mobilizing. 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  Clinical Impression   Pt presents with generalized weakness, RLE/RUE incoordination, impaired standing balance, and decreased activity tolerance vs baseline. Pt to benefit from acute PT to address deficits. Pt ambulated hallway distance with RW, requiring min assist and presenting with occasional RLE buckling and knee recurvatum. Pt lives alone and states her children and relatives that live closeby cannot help her on d/c, therefore PT recommending SNF level of care vs HHPT pending pt progress with mobility and independence. Frequency reflects home with HHPT to maximize pt recovery and pt wishes to go home if possible. PT to progress mobility as tolerated, and will continue to follow acutely.      Follow Up Recommendations Home health PT;SNF;Supervision for mobility/OOB    Equipment Recommendations  None recommended by PT    Recommendations for Other Services       Precautions / Restrictions Precautions Precautions: Fall Restrictions Weight Bearing Restrictions: No      Mobility  Bed Mobility Overal bed mobility: Needs Assistance Bed Mobility: Supine to Sit     Supine to sit: Min assist     General bed mobility comments: min assist for completion of trunk elevation, scooting to EOB.  Transfers Overall transfer level: Needs assistance Equipment used: Rolling walker (2 wheeled) Transfers: Sit to/from Stand Sit to Stand: Min guard;From elevated surface          General transfer comment: for safety, STS x2 from EOB and from chair in hallway during seated rest break  Ambulation/Gait Ambulation/Gait assistance: +2 safety/equipment;Min assist;Min guard Gait Distance (Feet): 170 Feet (2x85 ft) Assistive device: Rolling walker (2 wheeled) Gait Pattern/deviations: Step-through pattern;Decreased stride length;Narrow base of support;Staggering right Gait velocity: decr   General Gait Details: min guard for safety, with occasional min assist to steady, +2 for lines/leads. Verbal cuing for upright posture, placement in RW, and maintaining straight trajectory in hallway as pt tends to track R. Seateed rest break x1 minute in halway for tachycardia, fatigue.  Stairs            Wheelchair Mobility    Modified Rankin (Stroke Patients Only) Modified Rankin (Stroke Patients Only) Pre-Morbid Rankin Score: No significant disability Modified Rankin: Moderately severe disability     Balance Overall balance assessment: Needs assistance Sitting-balance support: No upper extremity supported;Feet supported Sitting balance-Leahy Scale: Fair     Standing balance support: Bilateral upper extremity supported;During functional activity Standing balance-Leahy Scale: Poor                               Pertinent Vitals/Pain Pain Assessment: No/denies pain    Home Living Family/patient expects to be discharged to:: Private residence Living Arrangements: Alone Available Help at Discharge: Family (son lives 11 ft away, niece and brother in law also live closeby) Type of Home: Mobile home Home Access: Stairs to enter Entrance Stairs-Rails: Psychiatric nurse of Steps: 6 Home Layout: One level Home Equipment: Toilet riser;Walker - 2 wheels;Bedside commode;Tub bench;Cane - single point      Prior Function Level  of Independence: Independent with assistive device(s)         Comments: PTA, pt reports using BSC over the toilet  "half the time", uses RW at night, and uses tub bench all the time. Pt reports using cane during the day for ambulation. Pt working in [employer].     Hand Dominance   Dominant Hand: Right    Extremity/Trunk Assessment   Upper Extremity Assessment Upper Extremity Assessment: Defer to OT evaluation;RUE deficits/detail RUE Coordination: decreased fine motor;decreased gross motor    Lower Extremity Assessment Lower Extremity Assessment: RLE deficits/detail RLE Deficits / Details: heel-shin coordination test + for moderate incoordination, MMT reveals 5/5 knee flex/ext, hip abd/add, but functional weakness noted RLE Coordination: decreased gross motor;decreased fine motor LLE Deficits / Details: heel-shin coordination test WFL, MMT reveals 5/5 knee flex/ext, hip abd/add, but functional weakness noted    Cervical / Trunk Assessment Cervical / Trunk Assessment: Kyphotic  Communication   Communication: No difficulties  Cognition Arousal/Alertness: Awake/alert Behavior During Therapy: WFL for tasks assessed/performed Overall Cognitive Status: Within Functional Limits for tasks assessed                                        General Comments General comments (skin integrity, edema, etc.): BP mid and post-ambulation 139/89 and 147/94, respectively. HRmax 121 bpm during mobility.    Exercises     Assessment/Plan    PT Assessment Patient needs continued PT services  PT Problem List Decreased mobility;Decreased strength;Decreased coordination;Decreased activity tolerance;Decreased balance;Decreased knowledge of use of DME;Cardiopulmonary status limiting activity;Decreased safety awareness       PT Treatment Interventions DME instruction;Therapeutic activities;Gait training;Therapeutic exercise;Patient/family education;Balance training;Stair training;Functional mobility training;Neuromuscular re-education    PT Goals (Current goals can be found in the Care Plan section)   Acute Rehab PT Goals Patient Stated Goal: return home PT Goal Formulation: With patient Time For Goal Achievement: 05/29/20 Potential to Achieve Goals: Good    Frequency Min 4X/week   Barriers to discharge Decreased caregiver support      Co-evaluation               AM-PAC PT "6 Clicks" Mobility  Outcome Measure Help needed turning from your back to your side while in a flat bed without using bedrails?: A Little Help needed moving from lying on your back to sitting on the side of a flat bed without using bedrails?: A Little Help needed moving to and from a bed to a chair (including a wheelchair)?: A Little Help needed standing up from a chair using your arms (e.g., wheelchair or bedside chair)?: A Little Help needed to walk in hospital room?: A Little Help needed climbing 3-5 steps with a railing? : A Lot 6 Click Score: 17    End of Session Equipment Utilized During Treatment: Gait belt Activity Tolerance: Patient tolerated treatment well Patient left: in chair;with chair alarm set;with call bell/phone within reach Nurse Communication: Mobility status PT Visit Diagnosis: Other abnormalities of gait and mobility (R26.89);Muscle weakness (generalized) (M62.81)    Time: 7035-0093 PT Time Calculation (min) (ACUTE ONLY): 25 min   Charges:   PT Evaluation $PT Eval Low Complexity: 1 Low PT Treatments $Gait Training: 8-22 mins      Modene Andy E, PT Acute Rehabilitation Services Pager [phone]  Office [phone]  Rocio Wolak D Kristyne Woodring 05/15/2020, 2:07 PM

## 2020-05-15 NOTE — Evaluation (Addendum)
Speech Language Pathology Evaluation Patient Details Name: Morgan Bell MRN: [id number] DOB: 1951-10-03 Today's Date: 05/15/2020 Time: 0086-7619 SLP Time Calculation (min) (ACUTE ONLY): 21 min  Problem List:  Patient Active Problem List   Diagnosis Date Noted  . ICH (intracerebral hemorrhage) ([place]) 05/13/2020  . Lumbar stenosis with neurogenic claudication 10/18/2019  . Exertional angina ([facility]) 07/16/2018  . Dyslipidemia 10/24/2016  . TIA (transient ischemic attack) 01/02/2015  . Rheumatoid arthritis ([place]) 01/02/2015  . HTN (hypertension) 04/10/2014  . Melanoma of trunk ([place]) 01/31/2013   Past Medical History:  Past Medical History:  Diagnosis Date  . Arthritis    RA  . Exertional angina ([place]) 07/16/2018   CT coronary 09/13/18: Coronary Ca score 62.1, 68% for age/sex matched control, No evidence of obstructive CAD (1-24% LAD, RCA)  . Family history of adverse reaction to anesthesia    son has difficulty waking   . Headache(784.0)    HX MIGRAINES   . Hypertension   . Melanoma ([place])   . Obesity   . Stroke [facility])    MINI STROKE     21 YRS AGO    Past Surgical History:  Past Surgical History:  Procedure Laterality Date  . APPLICATION OF ROBOTIC ASSISTANCE FOR SPINAL PROCEDURE N/A 10/18/2019   Procedure: APPLICATION OF ROBOTIC ASSISTANCE FOR SPINAL PROCEDURE;  Surgeon: Kristeen Miss, MD;  Location: [place];  Service: Neurosurgery;  Laterality: N/A;  . CESAREAN SECTION  11/26/1973  . CESAREAN SECTION  09/22/1975  . CESAREAN SECTION  04/30/1978  . HEMORRHOID SURGERY  06/1987  . MELANOMA EXCISION WITH SENTINEL LYMPH NODE BIOPSY Right 03/02/2013   Procedure: Wide MELANOMA EXCISION Right Abdominal wall WITH SENTINEL LYMPH NODE mapping Right Axilla;  Surgeon: Merrie Roof, MD;  Location: [place];  Service: General;  Laterality: Right;  . NM [place]  2003   negative Bruce protocol exercise stress test with no evidence of perfusion abnormality, EF 66%  . TOTAL HIP  ARTHROPLASTY Right 03/2011   HPI:  69 y.o. female past medical history of hypertension, headaches, angina, rheumatoid arthritis, TIA 21 years ago with no residual deficits, posterior decompression with stabilization from T10-L4 secondary to burst fracture from [place] 10/2018, admitted 8/1 for sudden onset of dizziness and difficulty mobilizing. 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   Assessment / Plan / Recommendation Clinical Impression  Pt scored a 23/30 on the SLUMS, demonstrating mild cognitive impairment. She had difficulty with retrieval in delayed recall task, selective attention, and thought organization with occasional word-finding errors. Her speech is mildly slurred although intelligible in conversation, and pt believes that this may be her baseline. Recommend ongoing SLP f/u for cognition.     SLP Assessment  SLP Recommendation/Assessment: Patient needs continued Speech Lanaguage Pathology Services SLP Visit Diagnosis: Cognitive communication deficit (R41.841)    Follow Up Recommendations  Home health SLP;Other (comment) (intermittent supervision)    Frequency and Duration min 2x/week  2 weeks      SLP Evaluation Cognition  Overall Cognitive Status: Impaired/Different from baseline Arousal/Alertness: Awake/alert Orientation Level: Oriented X4 Attention: Selective Selective Attention: Impaired Selective Attention Impairment: Verbal complex Memory: Impaired Memory Impairment: Retrieval deficit;Decreased short term memory Decreased Short Term Memory: Verbal basic Awareness: Appears intact Problem Solving: Appears intact Safety/Judgment: Appears intact       Comprehension  Auditory Comprehension Overall Auditory Comprehension: Appears within functional limits for tasks assessed    Expression Expression Primary Mode of Expression: Verbal Verbal Expression Overall Verbal Expression:  Appears within functional limits for tasks assessed Written  Expression Dominant Hand: Right   Oral / Motor  Motor Speech Overall Motor Speech:  (slurred to SLP, pt says at baseline)   GO                    Osie Bond., M.A. [place] Acute Rehabilitation Services Pager [phone] Office [phone]  05/15/2020, 2:52 PM

## 2020-05-15 NOTE — Progress Notes (Signed)
STROKE TEAM PROGRESS NOTE   INTERVAL HISTORY Patient is sitting up comfortably in bed.  She has no complaints.  Blood pressure adequately controlled.  MRI scan shows stable small right cerebellar hemorrhage without any underlying lesion, mass-effect or midline shift.  Echocardiogram shows normal ejection fraction without cardiac source of embolism.  Vital signs stable.  Neuro exam unchanged. Vitals:   05/15/20 1330 05/15/20 1400 05/15/20 1500 05/15/20 1600  BP: 130/79 136/71 130/75 (!) 145/103  Pulse: (!) 108 (!) 101 98 (!) 104  Resp: (!) 22 (!) 23 20 20   Temp:    97.7 F (36.5 C)  TempSrc:    Oral  SpO2: 98% 96% 95% 97%  Height:       CBC:  Recent Labs  Lab 05/13/20 1310  WBC 6.3  HGB 12.7  HCT 40.8  MCV 83.1  PLT 440   Basic Metabolic Panel:  Recent Labs  Lab 05/13/20 1310  NA 141  K 3.2*  CL 105  CO2 23  GLUCOSE 136*  BUN 20  CREATININE 0.94  CALCIUM 9.8   Lipid Panel:  Recent Labs  Lab 05/13/20 2253  CHOL 106  TRIG 168*  HDL 23*  CHOLHDL 4.6  VLDL 34  LDLCALC 49   HgbA1c:  Recent Labs  Lab 05/13/20 2253  HGBA1C 5.6   Urine Drug Screen: No results for input(s): LABOPIA, COCAINSCRNUR, LABBENZ, AMPHETMU, THCU, LABBARB in the last 168 hours.  Alcohol Level No results for input(s): ETH in the last 168 hours.  IMAGING past 24 hours ECHOCARDIOGRAM COMPLETE  Result Date: 05/15/2020    ECHOCARDIOGRAM REPORT   Patient Name:   Morgan Bell Date of Exam: 05/15/2020 Medical Rec #:  [id number]        Height:       60.0 in Accession #:    [id number]       Weight:       135.0 lb Date of Birth:  [birth year]         BSA:          1.579 m Patient Age:    69 years         BP:           118/71 mmHg Patient Gender: F                HR:           91 bpm. Exam Location:  Inpatient Procedure: 2D Echo, Cardiac Doppler and Color Doppler Indications:    Stroke 434.91 / I163.9  History:        Patient has no prior history of Echocardiogram examinations.                 Stroke and  TIA, Signs/Symptoms:Chest Pain; Risk                 Factors:Hypertension, Dyslipidemia and Non-Smoker.  Sonographer:    Vickie Epley RDCS Referring Phys: [id number] ASHISH ARORA  Sonographer Comments: Image acquisition challenging due to patient body habitus. IMPRESSIONS  1. Left ventricular ejection fraction, by estimation, is 55 to 60%. The left ventricle has normal function. The left ventricle has no regional wall motion abnormalities. There is mild concentric left ventricular hypertrophy. Left ventricular diastolic parameters are consistent with Grade I diastolic dysfunction (impaired relaxation).  2. Right ventricular systolic function is normal. The right ventricular size is normal. There is normal pulmonary artery systolic pressure.  3. The mitral valve is grossly normal. No evidence of mitral valve  regurgitation.  4. The aortic valve is tricuspid. Aortic valve regurgitation is trivial. Mild aortic valve stenosis. FINDINGS  Left Ventricle: Left ventricular ejection fraction, by estimation, is 55 to 60%. The left ventricle has normal function. The left ventricle has no regional wall motion abnormalities. The left ventricular internal cavity size was normal in size. There is  mild concentric left ventricular hypertrophy. Left ventricular diastolic parameters are consistent with Grade I diastolic dysfunction (impaired relaxation). Indeterminate filling pressures. Right Ventricle: The right ventricular size is normal. No increase in right ventricular wall thickness. Right ventricular systolic function is normal. There is normal pulmonary artery systolic pressure. The tricuspid regurgitant velocity is 2.39 m/s, and  with an assumed right atrial pressure of 8 mmHg, the estimated right ventricular systolic pressure is 53.6 mmHg. Left Atrium: Left atrial size was normal in size. Right Atrium: Right atrial size was normal in size. Pericardium: There is no evidence of pericardial effusion. Mitral Valve: The mitral valve  is grossly normal. No evidence of mitral valve regurgitation. Tricuspid Valve: The tricuspid valve is normal in structure. Tricuspid valve regurgitation is mild. Aortic Valve: The aortic valve is tricuspid. Aortic valve regurgitation is trivial. Mild aortic stenosis is present. Pulmonic Valve: The pulmonic valve was not well visualized. Pulmonic valve regurgitation is not visualized. Aorta: The aortic root is normal in size and structure. IAS/Shunts: No atrial level shunt detected by color flow Doppler.  LEFT VENTRICLE PLAX 2D LVIDd:         4.22 cm     Diastology LVIDs:         3.13 cm     LV e' lateral:   4.03 cm/s LV PW:         1.11 cm     LV E/e' lateral: 11.2 LVOT diam:     1.80 cm     LV e' medial:    3.59 cm/s LV SV:         29          LV E/e' medial:  12.6 LV SV Index:   18 LVOT Area:     2.54 cm  LV Volumes (MOD) LV vol d, MOD A2C: 84.9 ml LV vol d, MOD A4C: 87.8 ml LV vol s, MOD A2C: 49.3 ml LV vol s, MOD A4C: 52.0 ml LV SV MOD A2C:     35.6 ml LV SV MOD A4C:     87.8 ml LV SV MOD BP:      34.1 ml RIGHT VENTRICLE RV S prime:     15.60 cm/s TAPSE (M-mode): 2.1 cm LEFT ATRIUM             Index       RIGHT ATRIUM          Index LA Vol (A2C):   28.7 ml 18.17 ml/m RA Area:     8.92 cm LA Vol (A4C):   21.8 ml 13.80 ml/m RA Volume:   16.70 ml 10.57 ml/m LA Biplane Vol: 26.2 ml 16.59 ml/m  AORTIC VALVE LVOT Vmax:   76.00 cm/s LVOT Vmean:  50.900 cm/s LVOT VTI:    0.114 m  AORTA Ao Root diam: 3.20 cm MITRAL VALVE               TRICUSPID VALVE MV Area (PHT): 4.17 cm    TR Peak grad:   22.8 mmHg MV Decel Time: 182 msec    TR Vmax:        239.00 cm/s MV E velocity: 45.30  cm/s MV A velocity: 66.00 cm/s  SHUNTS MV E/A ratio:  0.69        Systemic VTI:  0.11 m                            Systemic Diam: 1.80 cm [name] MD Electronically signed by Sanda Klein MD Signature Date/Time: 05/15/2020/12:17:08 PM    Final     PHYSICAL EXAM Pleasant elderly Caucasian lady not in distress. . Afebrile. Head is  nontraumatic. Neck is supple without bruit.    Cardiac exam no murmur or gallop. Lungs are clear to auscultation. Distal pulses are well felt. Neurological Exam ;  Awake  Alert oriented x 3. Normal speech and language.eye movements full without nystagmus.mild saccadic dysmetria on bilateral lateral gaze.  Fundi were not visualized. Vision acuity and fields appear normal. Hearing is normal. Palatal movements are normal. Face symmetric. Tongue midline. Normal strength, tone, reflexes and coordination. Normal sensation. Gait deferred.  ASSESSMENT/PLAN Morgan Bell is a 69 y.o. female with history of hypertension with questionable compliance to medication, headaches, angina, rheumatoid arthritis, TIA 21 years ago with no residual deficits presenting with dizziness and difficulty walking.   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 Small vessel disease. Atrophy. Sinus dz.  CTA head & neck no vascular abnormality. Mild atherosclerosis. Tortuosity.    MRI w/w/o R cerebellar hemorrhage w/ mild edema. No lesion. R basal ganglia and thalamic chronic microhemorrhages.  2D Echo status post   LDL 49  HgbA1c 5.6  VTE prophylaxis - SCDs   aspirin 81 mg daily prior to admission, now on No antithrombotic given hemorrhage   Therapy recommendations:  pending   Disposition:  pending   Admit to ICU  Hypertension  Home meds:  norvasc 10, avapro 300  Stable . SBP goal < 140 . Resume home meds . Wean cleviprex . Long-term BP goal normotensive  Hyperlipidemia  Home meds:  crestor 40 & fenofibrate 145  LDL 49  Hold statin in setting of acute hemorrhage   Resume statin at discharge  Other Stroke Risk Factors  Advanced age  Hx TIA  21 yrs ago w/o sequelae   Migraines  Other Active Problems  RA on [facility] day # 2 She presented with dizziness and ataxia secondary to small  right cerebellar hemorrhage likely of hypertensive etiology.  She has neurologically remained stable.[place]  Keep systolic blood pressure below  160.  Mobilize out of bed.  Therapy consults.  Transfer to neurology floor bed when available start oral blood pressure medications.  Long discussion patient and answered questions. This patient is critically ill and at significant risk of neurological worsening, death and care requires constant monitoring of vital signs, hemodynamics,respiratory and cardiac monitoring, extensive review of multiple databases, frequent neurological assessment, discussion with family, other specialists and medical decision making of high complexity.I have made any additions or clarifications directly to the above note.This critical care time does not reflect procedure time, or teaching time or supervisory time of PA/NP/Med Resident etc but could involve care discussion time.  I spent 30 minutes of neurocritical care time  in the care of  this patient. Antony Contras, MD    To contact Stroke Continuity provider, please refer to [email or web address] After hours, contact General Neurology

## 2020-05-15 NOTE — Progress Notes (Signed)
OT Cancellation Note  Patient Details Name: Morgan Bell MRN: [id number] DOB: 13-Oct-1951   Cancelled Treatment:    Reason Eval/Treat Not Completed: Active bedrest order  [place], OT/L   Acute OT Clinical Specialist Acute Rehabilitation Services Pager [phone] Office [phone]  05/15/2020, 7:24 AM

## 2020-05-16 NOTE — Progress Notes (Signed)
Pt lost her cell phone, stated she did not see it since she got transferred to [place] nor NT did not see pt cell phone. Pt transferred from 4 N ICU. Unit called, but they did not have any information about pt cell phone.

## 2020-05-16 NOTE — TOC Initial Note (Signed)
Transition of Care [facility]) - Initial/Assessment Note    Patient Details  Name: Morgan Bell MRN: [id number] Date of Birth: [birth year]  Transition of Care [facility]) CM/SW Contact:    Geralynn Ochs, LCSW Phone Number: 05/16/2020, 12:27 PM  Clinical Narrative:        CSW met with patient to discuss discharge plans. Patient agreeable to SNF at discharge, hopeful for [facility] as she had been there in January after back surgery. CSW sent referral for review. Patient has been fully vaccinated.            Expected Discharge Plan: Skilled Nursing Facility Barriers to Discharge: Insurance Authorization, Continued Medical Work up   Patient Goals and CMS Choice Patient states their goals for this hospitalization and ongoing recovery are:: to return home CMS Medicare.gov Compare Post Acute Care list provided to:: Patient Choice offered to / list presented to : Patient  Expected Discharge Plan and Services Expected Discharge Plan: [place] Choice: [place] arrangements for the past 2 months: Single Family Home                                      Prior Living Arrangements/Services Living arrangements for the past 2 months: Single Family Home Lives with:: Self Patient language and need for interpreter reviewed:: No Do you feel safe going back to the place where you live?: Yes      Need for Family Participation in Patient Care: No (Comment) Care giver support system in place?: No (comment)   Criminal Activity/Legal Involvement Pertinent to Current Situation/Hospitalization: No - Comment as needed  Activities of Daily Living Home Assistive Devices/Equipment: Cane (specify quad or straight), Walker (specify type), Eyeglasses ADL Screening (condition at time of admission) Patient's cognitive ability adequate to safely complete daily activities?: Yes Is the patient deaf or have difficulty hearing?: No Does the patient have  difficulty seeing, even when wearing glasses/contacts?: No Does the patient have difficulty concentrating, remembering, or making decisions?: No Patient able to express need for assistance with ADLs?: Yes Does the patient have difficulty dressing or bathing?: No Independently performs ADLs?: Yes (appropriate for developmental age) Does the patient have difficulty walking or climbing stairs?: Yes Weakness of Legs: Right Weakness of Arms/Hands: None  Permission Sought/Granted Permission sought to share information with : Chartered certified accountant granted to share information with : Yes, Verbal Permission Granted     Permission granted to share info w AGENCY: [facility]        Emotional Assessment Appearance:: Appears stated age Attitude/Demeanor/Rapport: Engaged Affect (typically observed): Appropriate Orientation: : Oriented to Self, Oriented to Place, Oriented to  Time, Oriented to Situation Alcohol / Substance Use: Not Applicable Psych Involvement: No (comment)  Admission diagnosis:  Hemorrhagic stroke ([place]) [I61.9] ICH (intracerebral hemorrhage) ([place]) [I61.9] Cerebellar stroke [facility]) [I63.9] Patient Active Problem List   Diagnosis Date Noted  . ICH (intracerebral hemorrhage) ([place]) 05/13/2020  . Lumbar stenosis with neurogenic claudication 10/18/2019  . Exertional angina ([facility]) 07/16/2018  . Dyslipidemia 10/24/2016  . TIA (transient ischemic attack) 01/02/2015  . Rheumatoid arthritis ([place]) 01/02/2015  . HTN (hypertension) 04/10/2014  . Melanoma of trunk ([place]) 01/31/2013   PCP:  Jearld Fenton, NP Pharmacy:   [facility] [address]  [place] [address] Phone: [phone] Fax: [phone]     Social Determinants of Health (SDOH) Interventions    Readmission Risk Interventions No flowsheet data found.

## 2020-05-16 NOTE — Evaluation (Signed)
Occupational Therapy Evaluation Patient Details Name: Morgan Bell MRN: [id number] DOB: 1950-12-13 Today's Date: 05/16/2020    History of Present Illness 69 y.o. female past medical history of hypertension, headaches, angina, rheumatoid arthritis, TIA 21 years ago with no residual deficits, posterior decompression with stabilization from T10-L4 secondary to burst fracture from [place] 10/2018, admitted 8/1 for sudden onset of dizziness and difficulty mobilizing. 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   Clinical Impression   Pt was ambulating with a cane and independent in ADL and IADL as well as working 32 hours a week prior to admission. Presents with impaired standing balance and weakness, but overall demonstrated ability to mobilize with RW and perform ADL at a min guard assist level. Cognition intact needs ongoing assessment, particularly with regard to IADL.    Follow Up Recommendations  Home health OT;Supervision/Assistance - 24 hour (initially)    Equipment Recommendations  None recommended by OT    Recommendations for Other Services       Precautions / Restrictions Precautions Precautions: Fall Restrictions Weight Bearing Restrictions: No      Mobility Bed Mobility Overal bed mobility: Modified Independent       Supine to sit: HOB elevated     General bed mobility comments: no physical assist  Transfers Overall transfer level: Needs assistance Equipment used: Rolling walker (2 wheeled) Transfers: Sit to/from Stand Sit to Stand: Min guard              Balance Overall balance assessment: Needs assistance   Sitting balance-Leahy Scale: Good       Standing balance-Leahy Scale: Poor Standing balance comment: reliant on B UE support                           ADL either performed or assessed with clinical judgement   ADL Overall ADL's : Needs assistance/impaired Eating/Feeding: Independent   Grooming: Brushing  hair;Sitting;Set up;Oral care;Supervision/safety;Standing   Upper Body Bathing: Set up;Sitting   Lower Body Bathing: Min guard;Sit to/from stand   Upper Body Dressing : Set up;Sitting   Lower Body Dressing: Min guard;Sit to/from stand   Toilet Transfer: Min guard;Ambulation;RW   Toileting- Water quality scientist and Hygiene: Min guard;Sit to/from stand       Functional mobility during ADLs: Min guard;Rolling walker       Vision Patient Visual Report: No change from baseline       Perception     Praxis      Pertinent Vitals/Pain Pain Assessment: Faces Faces Pain Scale: No hurt     Hand Dominance Right   Extremity/Trunk Assessment Upper Extremity Assessment Upper Extremity Assessment: RUE deficits/detail;LUE deficits/detail RUE Deficits / Details: arthritic changes in hand LUE Deficits / Details: arthritic changes in hand       Cervical / Trunk Assessment Cervical / Trunk Assessment: Kyphotic;Other exceptions (hx of multiple back sxs)   Communication Communication Communication: No difficulties   Cognition Arousal/Alertness: Awake/alert Behavior During Therapy: WFL for tasks assessed/performed Overall Cognitive Status: Impaired/Different from baseline Area of Impairment: Problem solving                             Problem Solving: Slow processing     General Comments       Exercises     Shoulder Instructions      Home Living Family/patient expects to be discharged to:: Private residence Living Arrangements: Alone Available Help  at Discharge: Family;Available PRN/intermittently Type of Home: Mobile home Home Access: Stairs to enter Entrance Stairs-Number of Steps: 6 Entrance Stairs-Rails: Right;Left Home Layout: One level     Bathroom Shower/Tub: Teacher, early years/pre: Standard     Home Equipment: Toilet riser;Walker - 2 wheels;Bedside commode;Tub bench;Cane - single point;Adaptive equipment Adaptive Equipment:  Reacher    Lives With: Alone    Prior Functioning/Environment Level of Independence: Independent with assistive device(s)        Comments: PTA, pt reports using BSC over the toilet "half the time", uses RW at night, and uses tub bench all the time. Pt reports using cane during the day for ambulation. Pt working in [employer].        OT Problem List: Decreased strength;Decreased activity tolerance;Impaired balance (sitting and/or standing);Decreased knowledge of use of DME or AE      OT Treatment/Interventions: Self-care/ADL training;DME and/or AE instruction;Patient/family education;Balance training;Therapeutic activities;Cognitive remediation/compensation    OT Goals(Current goals can be found in the care plan section) Acute Rehab OT Goals Patient Stated Goal: return home OT Goal Formulation: With patient Time For Goal Achievement: 05/30/20 Potential to Achieve Goals: Good ADL Goals Pt Will Perform Grooming: with modified independence;standing Pt Will Perform Lower Body Bathing: with modified independence;sit to/from stand Pt Will Perform Lower Body Dressing: with modified independence;sit to/from stand Pt Will Transfer to Toilet: with modified independence;ambulating;bedside commode (over toilet) Pt Will Perform Toileting - Clothing Manipulation and hygiene: with modified independence;sit to/from stand Pt Will Perform Tub/Shower Transfer: Tub transfer;ambulating;rolling walker;with modified independence  OT Frequency: Min 2X/week   Barriers to D/C:            Co-evaluation              AM-PAC OT "6 Clicks" Daily Activity     Outcome Measure Help from another person eating meals?: None Help from another person taking care of personal grooming?: A Little Help from another person toileting, which includes using toliet, bedpan, or urinal?: A Little Help from another person bathing (including washing, rinsing, drying)?: A Little Help from another person to put on and  taking off regular upper body clothing?: None Help from another person to put on and taking off regular lower body clothing?: A Little 6 Click Score: 20   End of Session Equipment Utilized During Treatment: Rolling walker  Activity Tolerance: Patient tolerated treatment well Patient left: in chair;with call bell/phone within reach;with chair alarm set  OT Visit Diagnosis: Other abnormalities of gait and mobility (R26.89);Unsteadiness on feet (R26.81)                Time: 7482-7078 OT Time Calculation (min): 31 min Charges:  OT General Charges $OT Visit: 1 Visit OT Evaluation $OT Eval Moderate Complexity: 1 Mod OT Treatments $Self Care/Home Management : 8-22 mins  Nestor Lewandowsky, OTR/L Acute Rehabilitation Services Pager: [phone] Office: [phone]  [place] So 05/16/2020, 10:26 AM

## 2020-05-16 NOTE — Progress Notes (Signed)
Physical Therapy Treatment Patient Details Name: Morgan Bell MRN: [id number] DOB: Jan 02, 1951 Today's Date: 05/16/2020    History of Present Illness 69 y.o. female past medical history of hypertension, headaches, angina, rheumatoid arthritis, TIA 21 years ago with no residual deficits, posterior decompression with stabilization from T10-L4 secondary to burst fracture from [place] 10/2018, admitted 8/1 for sudden onset of dizziness and difficulty mobilizing. 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    PT Comments    Patient received in bed, very pleasant and willing to participate. Continues to require min guard for safety as well as cues for safety due to some ongoing impulsivity and impaired safety awareness. SpO2 did drop to 85% on room air with gait distance but recovered back to 90s on room air with seated rest and PLB in her room. Continues to demonstrate significant functional activity tolerance deficit as well. Left sitting up in her chair with all needs met and chair alarm active. Will definitely benefit from SNF to maximize safety and function prior to return home with limited support from family available.     Follow Up Recommendations  SNF;Supervision for mobility/OOB     Equipment Recommendations  None recommended by PT    Recommendations for Other Services       Precautions / Restrictions Precautions Precautions: Fall Restrictions Weight Bearing Restrictions: No    Mobility  Bed Mobility Overal bed mobility: Modified Independent Bed Mobility: Supine to Sit     Supine to sit: HOB elevated     General bed mobility comments: no physical assist  Transfers Overall transfer level: Needs assistance Equipment used: Rolling walker (2 wheeled) Transfers: Sit to/from Stand Sit to Stand: Min guard         General transfer comment: min guard for safety, cues for hand placement and no physical assist given  Ambulation/Gait Ambulation/Gait assistance:  Min guard Gait Distance (Feet): 140 Feet (one brief standing rest break) Assistive device: Rolling walker (2 wheeled) Gait Pattern/deviations: Step-through pattern;Decreased stride length;Narrow base of support;Drifts right/left;Trunk flexed Gait velocity: decr   General Gait Details: min guard for safety but no physical assist given; one standing rest break in hallway to check O2 which was at 95% on room air but desatted to 85% on room air with return trip to her room    Stairs             Wheelchair Mobility    Modified Rankin (Stroke Patients Only) Modified Rankin (Stroke Patients Only) Pre-Morbid Rankin Score: No significant disability Modified Rankin: Moderate disability     Balance Overall balance assessment: Needs assistance Sitting-balance support: No upper extremity supported;Feet supported Sitting balance-Leahy Scale: Good     Standing balance support: Bilateral upper extremity supported;During functional activity Standing balance-Leahy Scale: Fair Standing balance comment: reliant on B UE support                            Cognition Arousal/Alertness: Awake/alert Behavior During Therapy: WFL for tasks assessed/performed Overall Cognitive Status: Impaired/Different from baseline Area of Impairment: Problem solving                             Problem Solving: Slow processing General Comments: slightly slow processing, slightly impulsive but nothing too wild      Exercises      General Comments General comments (skin integrity, edema, etc.): SpO2 95% mid gait distance on room air  however dropped to 85% by the time we had returned to the room, able to recover well with PLB and came back up to the 90s      Pertinent Vitals/Pain Pain Assessment: Faces Faces Pain Scale: No hurt    Home Living Family/patient expects to be discharged to:: Private residence Living Arrangements: Alone Available Help at Discharge: Family;Available  PRN/intermittently Type of Home: Mobile home Home Access: Stairs to enter Entrance Stairs-Rails: Right;Left Home Layout: One level Home Equipment: Toilet riser;Walker - 2 wheels;Bedside commode;Tub bench;Cane - single point;Adaptive equipment      Prior Function Level of Independence: Independent with assistive device(s)      Comments: PTA, pt reports using BSC over the toilet "half the time", uses RW at night, and uses tub bench all the time. Pt reports using cane during the day for ambulation. Pt working in [employer].   PT Goals (current goals can now be found in the care plan section) Acute Rehab PT Goals Patient Stated Goal: return home PT Goal Formulation: With patient Time For Goal Achievement: 05/29/20 Potential to Achieve Goals: Good Progress towards PT goals: Progressing toward goals    Frequency    Min 4X/week      PT Plan Discharge plan needs to be updated    Co-evaluation              AM-PAC PT "6 Clicks" Mobility   Outcome Measure  Help needed turning from your back to your side while in a flat bed without using bedrails?: A Little Help needed moving from lying on your back to sitting on the side of a flat bed without using bedrails?: A Little Help needed moving to and from a bed to a chair (including a wheelchair)?: A Little Help needed standing up from a chair using your arms (e.g., wheelchair or bedside chair)?: A Little Help needed to walk in hospital room?: A Little Help needed climbing 3-5 steps with a railing? : A Little 6 Click Score: 18    End of Session Equipment Utilized During Treatment: Gait belt Activity Tolerance: Patient tolerated treatment well Patient left: in chair;with chair alarm set;with call bell/phone within reach   PT Visit Diagnosis: Other abnormalities of gait and mobility (R26.89);Muscle weakness (generalized) (M62.81)     Time: 1610-9604 PT Time Calculation (min) (ACUTE ONLY): 13 min  Charges:  $Gait Training: 8-22  mins                     Windell Norfolk, DPT, PN1   Supplemental Physical Therapist [place]    Pager [phone] Acute Rehab Office [phone]

## 2020-05-16 NOTE — NC FL2 (Addendum)
[place] LEVEL OF CARE SCREENING TOOL     IDENTIFICATION  Patient Name: Morgan Bell Birthdate: [birth year] Sex: female Admission Date (Current Location): 05/13/2020  [facility] and [place] Number:  Herbalist and Address:  [facility] [place] [address] [place], [place] [address]      Provider Number: [id number]  Attending Physician Name and Address:  Garvin Fila, MD  Relative Name and Phone Number:       Current Level of Care: Hospital Recommended Level of Care: [place] Prior Approval Number:    Date Approved/Denied:   PASRR Number: [id number]  Discharge Plan: SNF    Current Diagnoses: Patient Active Problem List   Diagnosis Date Noted   ICH (intracerebral hemorrhage) ([place]) 05/13/2020   Lumbar stenosis with neurogenic claudication 10/18/2019   Exertional angina ([place]) 07/16/2018   Dyslipidemia 10/24/2016   TIA (transient ischemic attack) 01/02/2015   Rheumatoid arthritis ([place]) 01/02/2015   HTN (hypertension) 04/10/2014   Melanoma of trunk ([place]) 01/31/2013    Orientation RESPIRATION BLADDER Height & Weight     Self, Time, Situation, Place  Normal Continent Weight:   Height:  5' (152.4 cm)  BEHAVIORAL SYMPTOMS/MOOD NEUROLOGICAL BOWEL NUTRITION STATUS      Continent Diet (heart healthy)  AMBULATORY STATUS COMMUNICATION OF NEEDS Skin   Limited Assist Verbally Normal                       Personal Care Assistance Level of Assistance  Bathing, Feeding, Dressing Bathing Assistance: Limited assistance Feeding assistance: Independent Dressing Assistance: Limited assistance     Functional Limitations Info             [place]  PT (By licensed PT), OT (By licensed OT)     PT Frequency: 5x/wk OT Frequency: 5x/wk            Contractures Contractures Info: Not present    Additional Factors Info  Code Status, Allergies Code Status Info: Full Allergies Info:  Sulfa Antibiotics           Current Medications (05/16/2020):  This is the current hospital active medication list Current Facility-Administered Medications  Medication Dose Route Frequency Provider Last Rate Last Admin    stroke: mapping our early stages of recovery book   Does not apply Once Donzetta Starch, NP       acetaminophen (TYLENOL) tablet 650 mg  650 mg Oral Q4H PRN Donzetta Starch, NP       Or   acetaminophen (TYLENOL) 160 MG/5ML solution 650 mg  650 mg Per Tube Q4H PRN Donzetta Starch, NP       Or   acetaminophen (TYLENOL) suppository 650 mg  650 mg Rectal Q4H PRN Donzetta Starch, NP       amLODipine (NORVASC) tablet 5 mg  5 mg Oral BID Burnetta Sabin L, NP   5 mg at 05/16/20 0834   calcium-vitamin D (OSCAL WITH D) 500-200 MG-UNIT per tablet 1 tablet  1 tablet Oral Daily Donzetta Starch, NP   1 tablet at 05/16/20 0834   Chlorhexidine Gluconate Cloth 2 % PADS 6 each  6 each Topical Daily Donzetta Starch, NP   6 each at 05/15/20 1153   clevidipine (CLEVIPREX) infusion 0.5 mg/mL  0-21 mg/hr Intravenous Continuous Donzetta Starch, NP   Paused at 05/15/20 1504   gabapentin (NEURONTIN) capsule 100 mg  100 mg Oral QID  Donzetta Starch, NP   100 mg at 05/16/20 0834   irbesartan (AVAPRO) tablet 300 mg  300 mg Oral Daily Burnetta Sabin L, NP   300 mg at 05/16/20 0834   labetalol (NORMODYNE) injection 10-40 mg  10-40 mg Intravenous Q2H PRN Donzetta Starch, NP   20 mg at 05/15/20 1717   leflunomide (ARAVA) tablet 20 mg  20 mg Oral Daily Burnetta Sabin L, NP   20 mg at 05/16/20 0834   loratadine (CLARITIN) tablet 10 mg  10 mg Oral Daily Donzetta Starch, NP   10 mg at 05/16/20 0835   pantoprazole (PROTONIX) EC tablet 40 mg  40 mg Oral QHS Burnetta Sabin L, NP   40 mg at 05/15/20 2232   potassium chloride SA (KLOR-CON) CR tablet 20 mEq  20 mEq Oral Daily Donzetta Starch, NP   20 mEq at 05/16/20 3202   senna-docusate (Senokot-S) tablet 1 tablet  1 tablet Oral BID Donzetta Starch, NP   1 tablet at 05/16/20 3343      Discharge Medications: Please see discharge summary for a list of discharge medications.  Relevant Imaging Results:  Relevant Lab Results:   Additional Information SS#: [id number]  Geralynn Ochs, LCSW   I have personally obtained history,examined this patient, reviewed notes, independently viewed imaging studies, participated in medical decision making and plan of care.ROS completed by me personally and pertinent positives fully documented  I have made any additions or clarifications directly to the above note. Agree with note above.    Antony Contras, MD Medical Director [facility] Pager: [phone] 05/16/2020 3:35 PM

## 2020-05-17 DIAGNOSIS — Z8669 Personal history of other diseases of the nervous system and sense organs: Secondary | ICD-10-CM | POA: Diagnosis not present

## 2020-05-17 DIAGNOSIS — R278 Other lack of coordination: Secondary | ICD-10-CM | POA: Diagnosis not present

## 2020-05-17 DIAGNOSIS — Z8673 Personal history of transient ischemic attack (TIA), and cerebral infarction without residual deficits: Secondary | ICD-10-CM | POA: Diagnosis not present

## 2020-05-17 DIAGNOSIS — E559 Vitamin D deficiency, unspecified: Secondary | ICD-10-CM | POA: Diagnosis not present

## 2020-05-17 DIAGNOSIS — R2681 Unsteadiness on feet: Secondary | ICD-10-CM | POA: Diagnosis not present

## 2020-05-17 DIAGNOSIS — M48062 Spinal stenosis, lumbar region with neurogenic claudication: Secondary | ICD-10-CM | POA: Diagnosis not present

## 2020-05-17 DIAGNOSIS — M069 Rheumatoid arthritis, unspecified: Secondary | ICD-10-CM | POA: Diagnosis not present

## 2020-05-17 DIAGNOSIS — J309 Allergic rhinitis, unspecified: Secondary | ICD-10-CM | POA: Diagnosis not present

## 2020-05-17 DIAGNOSIS — D649 Anemia, unspecified: Secondary | ICD-10-CM | POA: Diagnosis not present

## 2020-05-17 DIAGNOSIS — R41841 Cognitive communication deficit: Secondary | ICD-10-CM | POA: Diagnosis not present

## 2020-05-17 DIAGNOSIS — M6281 Muscle weakness (generalized): Secondary | ICD-10-CM | POA: Diagnosis not present

## 2020-05-17 DIAGNOSIS — E785 Hyperlipidemia, unspecified: Secondary | ICD-10-CM | POA: Diagnosis not present

## 2020-05-17 DIAGNOSIS — Z7401 Bed confinement status: Secondary | ICD-10-CM | POA: Diagnosis not present

## 2020-05-17 DIAGNOSIS — G459 Transient cerebral ischemic attack, unspecified: Secondary | ICD-10-CM | POA: Diagnosis not present

## 2020-05-17 DIAGNOSIS — Z79899 Other long term (current) drug therapy: Secondary | ICD-10-CM | POA: Diagnosis not present

## 2020-05-17 DIAGNOSIS — M810 Age-related osteoporosis without current pathological fracture: Secondary | ICD-10-CM | POA: Diagnosis not present

## 2020-05-17 DIAGNOSIS — I10 Essential (primary) hypertension: Secondary | ICD-10-CM | POA: Diagnosis not present

## 2020-05-17 DIAGNOSIS — M2550 Pain in unspecified joint: Secondary | ICD-10-CM | POA: Diagnosis not present

## 2020-05-17 DIAGNOSIS — I614 Nontraumatic intracerebral hemorrhage in cerebellum: Secondary | ICD-10-CM | POA: Diagnosis not present

## 2020-05-17 DIAGNOSIS — C4359 Malignant melanoma of other part of trunk: Secondary | ICD-10-CM | POA: Diagnosis not present

## 2020-05-17 MED ORDER — POTASSIUM CHLORIDE CRYS ER 20 MEQ PO TBCR: 20.0000 meq | EXTENDED_RELEASE_TABLET | Freq: Every day | ORAL | Status: DC

## 2020-05-17 NOTE — Care Management Important Message (Signed)
Important Message  Patient Details  Name: Morgan Bell MRN: [id number] Date of Birth: 13-Mar-1951   Medicare Important Message Given:  Yes  Mailed to the patient home address.   Sudie Bandel 05/17/2020, 3:26 PM

## 2020-05-17 NOTE — Progress Notes (Signed)
Physical Therapy Treatment Patient Details Name: Morgan Bell MRN: [id number] DOB: 1951-07-09 Today's Date: 05/17/2020    History of Present Illness 69 y.o. female past medical history of hypertension, headaches, angina, rheumatoid arthritis, TIA 21 years ago with no residual deficits, posterior decompression with stabilization from T10-L4 secondary to burst fracture from [place] 10/2018, admitted 8/1 for sudden onset of dizziness and difficulty mobilizing. 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    PT Comments    Pt making good progress.  Needs min guard for safety and min cues for gait and transfer technique.  Pt lives alone and remains a fall risk.  Continue to recommend SNF.     Follow Up Recommendations  SNF;Supervision for mobility/OOB     Equipment Recommendations  None recommended by PT    Recommendations for Other Services       Precautions / Restrictions Precautions Precautions: Fall Restrictions Weight Bearing Restrictions: No    Mobility  Bed Mobility Overal bed mobility: Needs Assistance Bed Mobility: Supine to Sit     Supine to sit: Supervision;HOB elevated Sit to supine: Modified independent (Device/Increase time)   General bed mobility comments: Supervision to sit EOB with increased time and great effort noted on pt part  Transfers Overall transfer level: Needs assistance Equipment used: Rolling walker (2 wheeled) Transfers: Sit to/from Stand Sit to Stand: Min guard Stand pivot transfers: Min guard       General transfer comment: min guard for safety, cues for hand placement and no physical assist given  Ambulation/Gait Ambulation/Gait assistance: Min guard Gait Distance (Feet): 100 Feet (100'x2 with standing rest break) Assistive device: Rolling walker (2 wheeled) Gait Pattern/deviations: Step-through pattern;Decreased stride length;Narrow base of support;Drifts right/left;Trunk flexed Gait velocity: decr   General Gait  Details: Min guard for safety with 1 standing rest break.  Cues for posture and RW use.   Stairs             Wheelchair Mobility    Modified Rankin (Stroke Patients Only) Modified Rankin (Stroke Patients Only) Pre-Morbid Rankin Score: No significant disability Modified Rankin: Moderate disability     Balance Overall balance assessment: Needs assistance Sitting-balance support: No upper extremity supported;Feet supported Sitting balance-Leahy Scale: Good Sitting balance - Comments: Sat EOB for pill box test   Standing balance support: Bilateral upper extremity supported;During functional activity;No upper extremity supported Standing balance-Leahy Scale: Fair Standing balance comment: fair static standing balance, benefits from UE support during dynamic tasks                            Cognition Arousal/Alertness: Awake/alert Behavior During Therapy: WFL for tasks assessed/performed Overall Cognitive Status: Impaired/Different from baseline Area of Impairment: Problem solving                             Problem Solving: Slow processing General Comments: slow problem solving skills, but functional. Able to complete and pass pill box test in 8.5 minutes      Exercises      General Comments General comments (skin integrity, edema, etc.): Administered pill box test during this time for assessment of executive functioning and ability to appropriately manage medications. Pt required increased time to complete task (8.5 min), but able to complete without errors.      Pertinent Vitals/Pain Pain Assessment: No/denies pain    Home Living  Prior Function            PT Goals (current goals can now be found in the care plan section) Acute Rehab PT Goals Patient Stated Goal: return home PT Goal Formulation: With patient Time For Goal Achievement: 05/29/20 Potential to Achieve Goals: Good Progress towards PT goals:  Progressing toward goals    Frequency    Min 3X/week      PT Plan Frequency needs to be updated    Co-evaluation              AM-PAC PT "6 Clicks" Mobility   Outcome Measure  Help needed turning from your back to your side while in a flat bed without using bedrails?: A Little Help needed moving from lying on your back to sitting on the side of a flat bed without using bedrails?: A Little Help needed moving to and from a bed to a chair (including a wheelchair)?: A Little Help needed standing up from a chair using your arms (e.g., wheelchair or bedside chair)?: A Little Help needed to walk in hospital room?: A Little Help needed climbing 3-5 steps with a railing? : A Little 6 Click Score: 18    End of Session Equipment Utilized During Treatment: Gait belt Activity Tolerance: Patient tolerated treatment well;Other (comment) (Further treatment limited due to pt's lunch arrived) Patient left: in chair;with chair alarm set;with call bell/phone within reach Nurse Communication: Mobility status PT Visit Diagnosis: Other abnormalities of gait and mobility (R26.89);Muscle weakness (generalized) (M62.81)     Time: 3536-1443 PT Time Calculation (min) (ACUTE ONLY): 18 min  Charges:  $Gait Training: 8-22 mins                     Abran Richard, PT Acute Rehab Services Pager [phone] [facility] [place] 05/17/2020, 12:15 PM

## 2020-05-17 NOTE — Discharge Summary (Addendum)
Stroke Discharge Summary  Patient ID: Morgan Bell   MRN: [id number]      DOB: 1951/08/10  Date of Admission: 05/13/2020 Date of Discharge: 05/17/2020  Attending Physician:  Garvin Fila, MD, Stroke MD Consultant(s):    None  Patient's PCP:  Jearld Fenton, NP  DISCHARGE DIAGNOSIS:  Principal Problem:   ICH (intracerebral hemorrhage) ([place]) - R cerebellar ICH d/t HTN Active Problems:   Melanoma of trunk ([place])   HTN (hypertension)   Rheumatoid arthritis ([place])   Dyslipidemia   Hx of migraines   Allergies as of 05/17/2020       Reactions   Sulfa Antibiotics Rash   SULFA DRUGS        Medication List     STOP taking these medications    aspirin EC 81 MG tablet   HYDROcodone-acetaminophen 5-325 MG tablet Commonly known as: NORCO/VICODIN       TAKE these medications    amLODipine 10 MG tablet Commonly known as: NORVASC Take 0.5 tablets (5 mg total) by mouth 2 (two) times daily.   calcium-vitamin D 500-200 MG-UNIT tablet Commonly known as: OSCAL WITH D Take 1 tablet by mouth daily.   cetirizine 10 MG tablet Commonly known as: ZYRTEC Take 10 mg by mouth daily.   fenofibrate 145 MG tablet Commonly known as: TRICOR TAKE 1 TABLET BY MOUTH DAILY   gabapentin 100 MG capsule Commonly known as: NEURONTIN Take 100 mg by mouth 4 (four) times daily.   irbesartan 300 MG tablet Commonly known as: AVAPRO TAKE 1 TABLET BY MOUTH EVERY DAY   leflunomide 20 MG tablet Commonly known as: ARAVA Take 20 mg daily by mouth.   OVER THE COUNTER MEDICATION Take 1 tablet by mouth in the morning, at noon, in the evening, and at bedtime. Medication: Kscurminin   potassium chloride SA 20 MEQ tablet Commonly known as: KLOR-CON Take 1 tablet (20 mEq total) by mouth daily.   rosuvastatin 40 MG tablet Commonly known as: CRESTOR TAKE 1 TABLET BY MOUTH EVERY DAY        LABORATORY STUDIES CBC    Component Value Date/Time   WBC 6.3 05/13/2020 1310   RBC 4.91  05/13/2020 1310   HGB 12.7 05/13/2020 1310   HCT 40.8 05/13/2020 1310   PLT 272 05/13/2020 1310   MCV 83.1 05/13/2020 1310   MCV 93.1 01/16/2014 2105   MCH 25.9 (L) 05/13/2020 1310   MCHC 31.1 05/13/2020 1310   RDW 16.9 (H) 05/13/2020 1310   LYMPHSABS 2.3 02/28/2013 1545   MONOABS 1.0 02/28/2013 1545   EOSABS 0.3 02/28/2013 1545   BASOSABS 0.1 02/28/2013 1545   CMP    Component Value Date/Time   NA 141 05/13/2020 1310   NA 142 04/23/2018 0829   K 3.2 (L) 05/13/2020 1310   CL 105 05/13/2020 1310   CO2 23 05/13/2020 1310   GLUCOSE 136 (H) 05/13/2020 1310   BUN 20 05/13/2020 1310   BUN 24 04/23/2018 0829   CREATININE 0.94 05/13/2020 1310   CREATININE 1.45 (H) 11/10/2016 1418   CALCIUM 9.8 05/13/2020 1310   PROT 6.0 03/21/2020 1456   PROT 6.8 04/23/2018 0829   ALBUMIN 3.8 03/21/2020 1456   ALBUMIN 4.2 04/23/2018 0829   AST 23 03/21/2020 1456   ALT 8 03/21/2020 1456   ALKPHOS 116 03/21/2020 1456   BILITOT 0.6 03/21/2020 1456   BILITOT 0.5 04/23/2018 0829   GFRNONAA >60 05/13/2020 1310   GFRAA >60 05/13/2020 1310  COAGS Lab Results  Component Value Date   INR 1.0 05/13/2020   INR 1.57 (H) 03/20/2011   INR 1.37 03/19/2011   Lipid Panel    Component Value Date/Time   CHOL 106 05/13/2020 2253   CHOL 120 04/23/2018 0829   TRIG 168 (H) 05/13/2020 2253   HDL 23 (L) 05/13/2020 2253   HDL 17 (L) 04/23/2018 0829   CHOLHDL 4.6 05/13/2020 2253   VLDL 34 05/13/2020 2253   LDLCALC 49 05/13/2020 2253   LDLCALC 41 04/23/2018 0829   HgbA1C  Lab Results  Component Value Date   HGBA1C 5.6 05/13/2020   Urinalysis    Component Value Date/Time   COLORURINE YELLOW 05/13/2020 1322   APPEARANCEUR CLEAR 05/13/2020 1322   LABSPEC 1.013 05/13/2020 1322   PHURINE 7.0 05/13/2020 1322   GLUCOSEU NEGATIVE 05/13/2020 1322   HGBUR NEGATIVE 05/13/2020 1322   BILIRUBINUR NEGATIVE 05/13/2020 1322   KETONESUR NEGATIVE 05/13/2020 1322   PROTEINUR 100 (A) 05/13/2020 1322    UROBILINOGEN 0.2 03/12/2011 1030   NITRITE NEGATIVE 05/13/2020 1322   LEUKOCYTESUR NEGATIVE 05/13/2020 1322    SIGNIFICANT DIAGNOSTIC STUDIES CT ANGIO HEAD W OR WO CONTRAST  Result Date: 05/13/2020 CLINICAL DATA:  Initial evaluation for difficulty with ambulation, dizziness. EXAM: CT ANGIOGRAPHY HEAD AND NECK TECHNIQUE: Multidetector CT imaging of the head and neck was performed using the standard protocol during bolus administration of intravenous contrast. Multiplanar CT image reconstructions and MIPs were obtained to evaluate the vascular anatomy. Carotid stenosis measurements (when applicable) are obtained utilizing NASCET criteria, using the distal internal carotid diameter as the denominator. CONTRAST:  9mL OMNIPAQUE IOHEXOL 350 MG/ML SOLN COMPARISON:  Prior head CT from earlier same day. FINDINGS: CTA NECK FINDINGS Aortic arch: Partially visualized aortic arch normal in caliber. Origin of the great vessels incompletely visualized. Mild-to-moderate atheromatous change noted about the visualized arch itself. Right carotid system: Right CCA tortuous proximally but widely patent to the bifurcation. No significant atheromatous narrowing about the right bifurcation. Right ICA tortuous but widely patent without stenosis, dissection or occlusion. Left carotid system: Left CCA tortuous proximally but widely patent to the bifurcation without stenosis. Mild atheromatous change about the left bifurcation without stenosis. Left ICA tortuous but widely patent without stenosis, dissection or occlusion. Vertebral arteries: Both vertebral arteries arise from the subclavian arteries. Left vertebral artery dominant. No hemodynamically significant proximal subclavian artery stenosis. Vertebral arteries tortuous but widely patent within the neck without stenosis, dissection or occlusion. Skeleton: No acute osseous abnormality. No discrete or worrisome osseous lesions. Moderate multilevel cervical spondylosis. Other neck:  No other acute soft tissue abnormality within the neck. No mass lesion or adenopathy. Upper chest: Visualized upper chest demonstrates no acute finding. Review of the MIP images confirms the above findings CTA HEAD FINDINGS Anterior circulation: Both internal carotid arteries widely patent to the termini without stenosis. Mild atheromatous change noted within the carotid siphons. A1 segments patent bilaterally. Right A1 hypoplastic, accounting for the diminutive right ICA is compared to the left. Normal anterior communicating artery complex. Anterior cerebral arteries widely patent to their distal aspects. No M1 stenosis or occlusion. Normal MCA bifurcations. Distal MCA branches well perfused and symmetric. Posterior circulation: Vertebral arteries widely patent to the vertebrobasilar junction without stenosis. Both picas patent. Basilar widely patent to its distal aspect without stenosis. Superior cerebral arteries patent bilaterally. Both PCAs primarily supplied via the basilar and are well perfused to their distal aspects. Venous sinuses: Grossly patent allowing for timing the contrast bolus. Anatomic variants: Hypoplastic  right A1 segment. No intracranial aneurysm. No vascular abnormality seen underlying the acute cerebellar hemorrhage. Review of the MIP images confirms the above findings IMPRESSION: 1. Negative CTA for large vessel occlusion. No vascular abnormality seen underlying the acute cerebellar hemorrhage. 2. Mild atheromatous change about the aortic arch, carotid bifurcations, and carotid siphons without hemodynamically significant stenosis. 3. Diffuse tortuosity of the major arterial vasculature of the head and neck, suggesting chronic underlying hypertension. Electronically Signed   By: Jeannine Boga M.D.   On: 05/13/2020 23:19   CT Head Wo Contrast  Result Date: 05/13/2020 CLINICAL DATA:  Nonspecific dizziness.  Difficulty walking. EXAM: CT HEAD WITHOUT CONTRAST TECHNIQUE: Contiguous axial  images were obtained from the base of the skull through the vertex without intravenous contrast. COMPARISON:  None. FINDINGS: Brain: Acute right cerebellar hemorrhage measures 1.2 x 1.2 x 1.7 cm (volume = 1.3 cm^3). Slight surrounding mass effect/edema. No significant midline shift. The basilar cisterns remain patent. There is no subarachnoid or intraventricular extension generalized atrophy and mild chronic small vessel ischemia. No subdural or extra-axial collection. Vascular: Atherosclerosis of skullbase vasculature without hyperdense vessel or abnormal calcification. Skull: No fracture or focal lesion. Sinuses/Orbits: Lobulated mucosal thickening involving the left greater than right maxillary sinuses. Scattered mucosal thickening of ethmoid air cells and inferior frontal sinuses. Mastoid air cells are clear. Included orbits are unremarkable. Other: None. IMPRESSION: 1. Acute right cerebellar hemorrhage measuring 1.2 x 1.2 x 1.7 cm. Slight surrounding mass effect/edema. No significant midline shift. The basilar cisterns remain patent. 2. Generalized atrophy and chronic small vessel ischemia. 3. Paranasal sinus disease. Critical Value/emergent results were called by telephone at the time of interpretation on 05/13/2020 at 9:11 pm to Dr Varney Biles , who verbally acknowledged these results. Electronically Signed   By: Keith Rake M.D.   On: 05/13/2020 21:11   CT ANGIO NECK W OR WO CONTRAST  Result Date: 05/13/2020 CLINICAL DATA:  Initial evaluation for difficulty with ambulation, dizziness. EXAM: CT ANGIOGRAPHY HEAD AND NECK TECHNIQUE: Multidetector CT imaging of the head and neck was performed using the standard protocol during bolus administration of intravenous contrast. Multiplanar CT image reconstructions and MIPs were obtained to evaluate the vascular anatomy. Carotid stenosis measurements (when applicable) are obtained utilizing NASCET criteria, using the distal internal carotid diameter as the  denominator. CONTRAST:  55mL OMNIPAQUE IOHEXOL 350 MG/ML SOLN COMPARISON:  Prior head CT from earlier same day. FINDINGS: CTA NECK FINDINGS Aortic arch: Partially visualized aortic arch normal in caliber. Origin of the great vessels incompletely visualized. Mild-to-moderate atheromatous change noted about the visualized arch itself. Right carotid system: Right CCA tortuous proximally but widely patent to the bifurcation. No significant atheromatous narrowing about the right bifurcation. Right ICA tortuous but widely patent without stenosis, dissection or occlusion. Left carotid system: Left CCA tortuous proximally but widely patent to the bifurcation without stenosis. Mild atheromatous change about the left bifurcation without stenosis. Left ICA tortuous but widely patent without stenosis, dissection or occlusion. Vertebral arteries: Both vertebral arteries arise from the subclavian arteries. Left vertebral artery dominant. No hemodynamically significant proximal subclavian artery stenosis. Vertebral arteries tortuous but widely patent within the neck without stenosis, dissection or occlusion. Skeleton: No acute osseous abnormality. No discrete or worrisome osseous lesions. Moderate multilevel cervical spondylosis. Other neck: No other acute soft tissue abnormality within the neck. No mass lesion or adenopathy. Upper chest: Visualized upper chest demonstrates no acute finding. Review of the MIP images confirms the above findings CTA HEAD FINDINGS Anterior circulation: Both  internal carotid arteries widely patent to the termini without stenosis. Mild atheromatous change noted within the carotid siphons. A1 segments patent bilaterally. Right A1 hypoplastic, accounting for the diminutive right ICA is compared to the left. Normal anterior communicating artery complex. Anterior cerebral arteries widely patent to their distal aspects. No M1 stenosis or occlusion. Normal MCA bifurcations. Distal MCA branches well perfused  and symmetric. Posterior circulation: Vertebral arteries widely patent to the vertebrobasilar junction without stenosis. Both picas patent. Basilar widely patent to its distal aspect without stenosis. Superior cerebral arteries patent bilaterally. Both PCAs primarily supplied via the basilar and are well perfused to their distal aspects. Venous sinuses: Grossly patent allowing for timing the contrast bolus. Anatomic variants: Hypoplastic right A1 segment. No intracranial aneurysm. No vascular abnormality seen underlying the acute cerebellar hemorrhage. Review of the MIP images confirms the above findings IMPRESSION: 1. Negative CTA for large vessel occlusion. No vascular abnormality seen underlying the acute cerebellar hemorrhage. 2. Mild atheromatous change about the aortic arch, carotid bifurcations, and carotid siphons without hemodynamically significant stenosis. 3. Diffuse tortuosity of the major arterial vasculature of the head and neck, suggesting chronic underlying hypertension. Electronically Signed   By: Jeannine Boga M.D.   On: 05/13/2020 23:19   MR BRAIN W WO CONTRAST  Result Date: 05/14/2020 CLINICAL DATA:  Cerebellar hemorrhage EXAM: MRI HEAD WITHOUT AND WITH CONTRAST TECHNIQUE: Multiplanar, multiecho pulse sequences of the brain and surrounding structures were obtained without and with intravenous contrast. CONTRAST:  10mL GADAVIST GADOBUTROL 1 MMOL/ML IV SOLN COMPARISON:  Correlation made with recent CT imaging FINDINGS: Brain: Acute hemorrhage of the right cerebellum is again identified and is likely similar in size to the prior CT. There is mild mass effect on the adjacent fourth ventricle without hydrocephalus. There is no apparent intraventricular extension. Mild surrounding edema is present. No abnormal enhancement to suggest underlying lesion. No acute infarction. Two small foci of susceptibility hypointensity are identified at the lateral aspect of the right thalamus and posterior  aspect of the right lentiform nucleus likely reflecting chronic microhemorrhages. Prominence of the ventricles and sulci reflects generalized parenchymal volume loss. Patchy foci of T2 hyperintensity in the supratentorial white matter are nonspecific but may reflect mild chronic microvascular ischemic changes. Vascular: Major vessel flow voids at the skull base are preserved. Skull and upper cervical spine: Normal marrow signal is preserved. Sinuses/Orbits: Moderate left maxillary sinus circumferential mucosal thickening. Right maxillary retention cyst or polyp. Mild mucosal thickening elsewhere. Orbits are unremarkable. Other: Sella is unremarkable.  Mastoid air cells are clear. IMPRESSION: Right cerebellar hemorrhage with mild edema and mass effect. No apparent intraventricular extension. No evidence of underlying lesion. Two foci of likely hypertensive chronic microhemorrhage involving right basal ganglia and thalamus. Electronically Signed   By: Macy Mis M.D.   On: 05/14/2020 12:57   ECHOCARDIOGRAM COMPLETE  Result Date: 05/15/2020    ECHOCARDIOGRAM REPORT   Patient Name:   MARTIN SMEAL Date of Exam: 05/15/2020 Medical Rec #:  [id number]        Height:       60.0 in Accession #:    [id number]       Weight:       135.0 lb Date of Birth:  [birth year]         BSA:          1.579 m Patient Age:    10 years         BP:  118/71 mmHg Patient Gender: F                HR:           91 bpm. Exam Location:  Inpatient Procedure: 2D Echo, Cardiac Doppler and Color Doppler Indications:    Stroke 434.91 / I163.9  History:        Patient has no prior history of Echocardiogram examinations.                 Stroke and TIA, Signs/Symptoms:Chest Pain; Risk                 Factors:Hypertension, Dyslipidemia and Non-Smoker.  Sonographer:    Vickie Epley RDCS Referring Phys: [id number] ASHISH ARORA  Sonographer Comments: Image acquisition challenging due to patient body habitus. IMPRESSIONS  1. Left ventricular ejection  fraction, by estimation, is 55 to 60%. The left ventricle has normal function. The left ventricle has no regional wall motion abnormalities. There is mild concentric left ventricular hypertrophy. Left ventricular diastolic parameters are consistent with Grade I diastolic dysfunction (impaired relaxation).  2. Right ventricular systolic function is normal. The right ventricular size is normal. There is normal pulmonary artery systolic pressure.  3. The mitral valve is grossly normal. No evidence of mitral valve regurgitation.  4. The aortic valve is tricuspid. Aortic valve regurgitation is trivial. Mild aortic valve stenosis. FINDINGS  Left Ventricle: Left ventricular ejection fraction, by estimation, is 55 to 60%. The left ventricle has normal function. The left ventricle has no regional wall motion abnormalities. The left ventricular internal cavity size was normal in size. There is  mild concentric left ventricular hypertrophy. Left ventricular diastolic parameters are consistent with Grade I diastolic dysfunction (impaired relaxation). Indeterminate filling pressures. Right Ventricle: The right ventricular size is normal. No increase in right ventricular wall thickness. Right ventricular systolic function is normal. There is normal pulmonary artery systolic pressure. The tricuspid regurgitant velocity is 2.39 m/s, and  with an assumed right atrial pressure of 8 mmHg, the estimated right ventricular systolic pressure is 46.9 mmHg. Left Atrium: Left atrial size was normal in size. Right Atrium: Right atrial size was normal in size. Pericardium: There is no evidence of pericardial effusion. Mitral Valve: The mitral valve is grossly normal. No evidence of mitral valve regurgitation. Tricuspid Valve: The tricuspid valve is normal in structure. Tricuspid valve regurgitation is mild. Aortic Valve: The aortic valve is tricuspid. Aortic valve regurgitation is trivial. Mild aortic stenosis is present. Pulmonic Valve: The  pulmonic valve was not well visualized. Pulmonic valve regurgitation is not visualized. Aorta: The aortic root is normal in size and structure. IAS/Shunts: No atrial level shunt detected by color flow Doppler.  LEFT VENTRICLE PLAX 2D LVIDd:         4.22 cm     Diastology LVIDs:         3.13 cm     LV e' lateral:   4.03 cm/s LV PW:         1.11 cm     LV E/e' lateral: 11.2 LVOT diam:     1.80 cm     LV e' medial:    3.59 cm/s LV SV:         29          LV E/e' medial:  12.6 LV SV Index:   18 LVOT Area:     2.54 cm  LV Volumes (MOD) LV vol d, MOD A2C: 84.9 ml LV vol d, MOD A4C: 87.8  ml LV vol s, MOD A2C: 49.3 ml LV vol s, MOD A4C: 52.0 ml LV SV MOD A2C:     35.6 ml LV SV MOD A4C:     87.8 ml LV SV MOD BP:      34.1 ml RIGHT VENTRICLE RV S prime:     15.60 cm/s TAPSE (M-mode): 2.1 cm LEFT ATRIUM             Index       RIGHT ATRIUM          Index LA Vol (A2C):   28.7 ml 18.17 ml/m RA Area:     8.92 cm LA Vol (A4C):   21.8 ml 13.80 ml/m RA Volume:   16.70 ml 10.57 ml/m LA Biplane Vol: 26.2 ml 16.59 ml/m  AORTIC VALVE LVOT Vmax:   76.00 cm/s LVOT Vmean:  50.900 cm/s LVOT VTI:    0.114 m  AORTA Ao Root diam: 3.20 cm MITRAL VALVE               TRICUSPID VALVE MV Area (PHT): 4.17 cm    TR Peak grad:   22.8 mmHg MV Decel Time: 182 msec    TR Vmax:        239.00 cm/s MV E velocity: 45.30 cm/s MV A velocity: 66.00 cm/s  SHUNTS MV E/A ratio:  0.69        Systemic VTI:  0.11 m                            Systemic Diam: 1.80 cm Mihai Croitoru MD Electronically signed by Sanda Klein MD Signature Date/Time: 05/15/2020/12:17:08 PM    Final       HISTORY OF PRESENT ILLNESS Morgan Bell is a 69 y.o. female past medical history of hypertension with questionable compliance to medication, headaches, angina, rheumatoid arthritis, TIA 21 years ago with no residual deficits, presenting to the emergency room for sudden onset of dizziness and difficulty getting out of bed and walking. She reports that she woke up normally on  the morning of Saturday, May 12, 2020 at 0800 and as she was waiting to go to the bathroom and watching some TV, she had sudden onset of dizziness-which she describes initially as lightheadedness followed by a sensation of room spinning that would not go away and would make walking difficult.  She stayed in bed and did not take her medications.  When this did not improve over the next day, she called her son who brought her to the emergency room.  She was evaluated in the emergency room and a noncontrast head CT revealed a intraparenchymal bleed in the right cerebellum with mild surrounding mass-effect with no significant midline shift.  Basilar cisterns patent. Continues to have dizziness and mild head discomfort but no severe headaches. Denies any visual changes or diplopia associated with the symptom onset.  Denies any speech problems specifically no dysarthria associated with the other symptoms. Denies any tingling numbness or weakness.  Reports gait instability while walking.  Denies chest pain shortness of breath nausea vomiting.  Reports chronic back pain. Systolic blood pressures greater than 140s on arrival, then running in 170s. Premorbid modified Rankin scale (mRS): 1. ICH score: 1    HOSPITAL COURSE Morgan Bell is a 69 y.o. female with history of hypertension with questionable compliance to medication, headaches, angina, rheumatoid arthritis, TIA 21 years ago with no residual deficits presenting with dizziness and difficulty walking.  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 Small vessel disease. Atrophy. Sinus dz. CTA head & neck no vascular abnormality. Mild atherosclerosis. Tortuosity.   MRI w/w/o R cerebellar hemorrhage w/ mild edema. No lesion. R basal ganglia and thalamic chronic microhemorrhages. 2D Echo EF 55-60%. No source of embolus   LDL 49 HgbA1c 5.6 aspirin 81 mg daily prior to admission,  now on No antithrombotic given hemorrhage  Therapy recommendations:  HH PT, OT, SLP -> agree w/ SNF as assist no available Disposition: short term SNF - lived alone PTA. Son can stay with her on the weekend   Hypertension Home meds:  norvasc 10, avapro 300 Treated w/ cleviprex, now off SBP goal < 160 Resumed home meds Stable BP goal normotensive   Hyperlipidemia Home meds:  crestor 40 & fenofibrate 145 LDL 49 Hold statin in setting of acute hemorrhage  Resume statin at discharge   Other Stroke Risk Factors Advanced age Hx TIA 21 yrs ago w/o sequelae  Migraines   Other Active Problems RA on Arava Melanoma   DISCHARGE EXAM Blood pressure (!) 141/99, pulse 90, temperature 98 F (36.7 C), temperature source Oral, resp. rate 18, height 5' (1.524 m), SpO2 95 %. Pleasant elderly Caucasian lady not in distress. . Afebrile. Head is nontraumatic. Neck is supple without bruit.    Cardiac exam no murmur or gallop. Lungs are clear to auscultation. Distal pulses are well felt. Neurological Exam ;  Awake  Alert oriented x 3. Normal speech and language.eye movements full without nystagmus.mild saccadic dysmetria on bilateral lateral gaze.  Fundi were not visualized. Vision acuity and fields appear normal. Hearing is normal. Palatal movements are normal. Face symmetric. Tongue midline. Normal strength, tone, reflexes and coordination. Normal sensation. Gait deferred.  Discharge Diet   Heart healthy / carb modified thin liquids  DISCHARGE PLAN Disposition:  Skilled nursing facility for ongoing PT, OT and ST. ([facility]) Due to hemorrhage and risk of bleeding, do not take aspirin, aspirin-containing medications, or ibuprofen products  Follow-up PCP Jearld Fenton, NP in 2 weeks or provider at SNF Follow-up in [facility] in 4 weeks, office to schedule an appointment.   32 minutes were spent preparing discharge.  Burnetta Sabin, MSN, APRN, ANVP-BC,  AGPCNP-BC Advanced Practice Stroke Nurse [place] for Schedule & Pager information 05/17/2020 12:23 PM   I have personally obtained history,examined this patient, reviewed notes, independently viewed imaging studies, participated in medical decision making and plan of care.ROS completed by me personally and pertinent positives fully documented  I have made any additions or clarifications directly to the above note. Agree with note above.    Antony Contras, MD Medical Director [facility] Pager: [phone] 05/17/2020 1:21 PM

## 2020-05-17 NOTE — TOC Transition Note (Signed)
Transition of Care [facility]) - CM/SW Discharge Note   Patient Details  Name: Morgan Bell MRN: [id number] Date of Birth: 09-27-1951  [facility]) CM/SW Contact:  Joanne Chars, LCSW Phone Number: 05/17/2020, 2:27 PM   Clinical Narrative:   RN please call report to [phone].  PT going to [facility], Room 202.    Final next level of care: Skilled Nursing Facility Barriers to Discharge: Barriers Resolved   Patient Goals and CMS Choice Patient states their goals for this hospitalization and ongoing recovery are:: to return home CMS Medicare.gov Compare Post Acute Care list provided to:: Patient Choice offered to / list presented to : Patient  Discharge Placement              Patient chooses bed at: [place] Patient to be transferred to facility by: [place] Name of family member notified: pt Patient and family notified of of transfer: 05/17/20  Discharge Plan and Services     Post Acute Care Choice: [name]                               Social Determinants of Health (SDOH) Interventions     Readmission Risk Interventions No flowsheet data found.

## 2020-05-17 NOTE — Progress Notes (Signed)
Occupational Therapy Treatment Patient Details Name: Morgan Bell MRN: [id number] DOB: 1951-05-12 Today's Date: 05/17/2020    History of present illness 69 y.o. female past medical history of hypertension, headaches, angina, rheumatoid arthritis, TIA 21 years ago with no residual deficits, posterior decompression with stabilization from T10-L4 secondary to burst fracture from [place] 10/2018, admitted 8/1 for sudden onset of dizziness and difficulty mobilizing. 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   OT comments  Pt progressing with OT goals demonstrating ability to complete grooming tasks standing at sink and toileting tasks without physical assistance. Pt SpO2 >95% on RA throughout duration of these tasks. Pt continues to benefit from min guard for mobility tasks with intermittent cueing on appropriate RW use. Administered pill box test during session to assess higher level executive functioning for safe completion of IADL tasks, such as medication mgmt. Pt slow to complete pill box test (8.5 minutes), but demonstrated ability to problem solve, self monitor, and attend to task successfully and without errors. Pt did demonstrate some difficulty with fine motor coordination in opening medication containers, but with increased time, able to complete. Plan to further address activity tolerance and higher level ADL/IADL tasks.    Follow Up Recommendations  Home health OT;Supervision/Assistance - 24 hour;Other (comment) (SNF if 24/7 assist not available)    Equipment Recommendations  None recommended by OT    Recommendations for Other Services      Precautions / Restrictions Precautions Precautions: Fall Restrictions Weight Bearing Restrictions: No       Mobility Bed Mobility Overal bed mobility: Needs Assistance Bed Mobility: Supine to Sit;Sit to Supine     Supine to sit: Supervision;HOB elevated Sit to supine: Modified independent (Device/Increase time)   General  bed mobility comments: Supervision to sit EOB with increased time and great effort noted on pt part  Transfers Overall transfer level: Needs assistance Equipment used: Rolling walker (2 wheeled) Transfers: Sit to/from [facility] Sit to Stand: Supervision Stand pivot transfers: Min guard       General transfer comment: min guard for safety, cues for hand placement and no physical assist given    Balance Overall balance assessment: Needs assistance Sitting-balance support: No upper extremity supported;Feet supported Sitting balance-Leahy Scale: Good Sitting balance - Comments: Sat EOB for pill box test   Standing balance support: Bilateral upper extremity supported;During functional activity Standing balance-Leahy Scale: Fair Standing balance comment: fair static standing balance, benefits from UE support during dynamic tasks                           ADL either performed or assessed with clinical judgement   ADL Overall ADL's : Needs assistance/impaired     Grooming: Supervision/safety;Standing;Wash/dry hands;Wash/dry face;Oral care;Brushing hair Grooming Details (indicate cue type and reason): Supervision for all grooming tasks in standing with RW, no LOB noted                 Toilet Transfer: Min guard;Ambulation;RW Toilet Transfer Details (indicate cue type and reason): min guard for mobility to/from bathroom with RW Toileting- Clothing Manipulation and Hygiene: Supervision/safety;Sit to/from stand Toileting - Clothing Manipulation Details (indicate cue type and reason): Close supervision for hygiene and sit to stand from toilet     Functional mobility during ADLs: Min guard;Rolling walker General ADL Comments: Pt with improving steadiness on feet. SpO2 97% after activity (HR 102bpm) on RA.      Vision   Vision Assessment?: No apparent  visual deficits   Perception     Praxis      Cognition Arousal/Alertness: Awake/alert Behavior  During Therapy: WFL for tasks assessed/performed Overall Cognitive Status: Impaired/Different from baseline Area of Impairment: Problem solving                             Problem Solving: Slow processing General Comments: slow problem solving skills, but functional. Able to complete and pass pill box test in 8.5 minutes        Exercises     Shoulder Instructions       General Comments Administered pill box test during this time for assessment of executive functioning and ability to appropriately manage medications. Pt required increased time to complete task (8.5 min), but able to complete without errors.    Pertinent Vitals/ Pain       Pain Assessment: No/denies pain  Home Living                                          Prior Functioning/Environment              Frequency  Min 2X/week        Progress Toward Goals  OT Goals(current goals can now be found in the care plan section)  Progress towards OT goals: Progressing toward goals  Acute Rehab OT Goals Patient Stated Goal: return home OT Goal Formulation: With patient Time For Goal Achievement: 05/30/20 Potential to Achieve Goals: Good ADL Goals Pt Will Perform Grooming: with modified independence;standing Pt Will Perform Lower Body Bathing: with modified independence;sit to/from stand Pt Will Perform Lower Body Dressing: with modified independence;sit to/from stand Pt Will Transfer to Toilet: with modified independence;ambulating;bedside commode Pt Will Perform Toileting - Clothing Manipulation and hygiene: with modified independence;sit to/from stand Pt Will Perform Tub/Shower Transfer: Tub transfer;ambulating;rolling walker;with modified independence Additional ADL Goal #1: Pt will completed pill box test without errors.  Plan Discharge plan needs to be updated    Co-evaluation                 AM-PAC OT "6 Clicks" Daily Activity     Outcome Measure   Help from  another person eating meals?: None Help from another person taking care of personal grooming?: A Little Help from another person toileting, which includes using toliet, bedpan, or urinal?: A Little Help from another person bathing (including washing, rinsing, drying)?: A Little Help from another person to put on and taking off regular upper body clothing?: None Help from another person to put on and taking off regular lower body clothing?: A Little 6 Click Score: 20    End of Session Equipment Utilized During Treatment: Rolling walker  OT Visit Diagnosis: Other abnormalities of gait and mobility (R26.89);Unsteadiness on feet (R26.81)   Activity Tolerance Patient tolerated treatment well   Patient Left in bed;with call bell/phone within reach;with bed alarm set   Nurse Communication Mobility status        Time: 1601-0932 OT Time Calculation (min): 30 min  Charges: OT General Charges $OT Visit: 1 Visit OT Treatments $Self Care/Home Management : 23-37 mins  Layla Maw, OTR/L   Layla Maw 05/17/2020, 10:53 AM

## 2020-05-17 NOTE — Plan of Care (Signed)
Pt is adequate for D/C. °

## 2020-05-17 NOTE — Discharge Instructions (Signed)
Due to hemorrhage and risk of bleeding, do not take aspirin, aspirin-containing medications, or ibuprofen products (for example, Goody Powders, Ibuprofen, Anacin, Alka-Seltzer, Bufferin, Ecotrin, Dristan, Sine-Off, Midol, Nuprin, Aleve, Thera-Flu, Advil). You may take Tylenol (acetaminophen).

## 2020-05-19 DIAGNOSIS — M810 Age-related osteoporosis without current pathological fracture: Secondary | ICD-10-CM | POA: Diagnosis not present

## 2020-05-19 DIAGNOSIS — I614 Nontraumatic intracerebral hemorrhage in cerebellum: Secondary | ICD-10-CM | POA: Diagnosis not present

## 2020-05-19 DIAGNOSIS — I10 Essential (primary) hypertension: Secondary | ICD-10-CM | POA: Diagnosis not present

## 2020-05-19 DIAGNOSIS — E785 Hyperlipidemia, unspecified: Secondary | ICD-10-CM | POA: Diagnosis not present

## 2020-05-30 DIAGNOSIS — I10 Essential (primary) hypertension: Secondary | ICD-10-CM | POA: Diagnosis not present

## 2020-05-30 DIAGNOSIS — R41841 Cognitive communication deficit: Secondary | ICD-10-CM | POA: Diagnosis not present

## 2020-05-30 DIAGNOSIS — R42 Dizziness and giddiness: Secondary | ICD-10-CM | POA: Diagnosis not present

## 2020-05-30 DIAGNOSIS — C4359 Malignant melanoma of other part of trunk: Secondary | ICD-10-CM | POA: Diagnosis not present

## 2020-05-30 DIAGNOSIS — I209 Angina pectoris, unspecified: Secondary | ICD-10-CM | POA: Diagnosis not present

## 2020-05-30 DIAGNOSIS — Z9181 History of falling: Secondary | ICD-10-CM | POA: Diagnosis not present

## 2020-05-30 DIAGNOSIS — M069 Rheumatoid arthritis, unspecified: Secondary | ICD-10-CM | POA: Diagnosis not present

## 2020-05-30 DIAGNOSIS — R2689 Other abnormalities of gait and mobility: Secondary | ICD-10-CM | POA: Diagnosis not present

## 2020-05-30 DIAGNOSIS — M4800 Spinal stenosis, site unspecified: Secondary | ICD-10-CM | POA: Diagnosis not present

## 2020-05-30 DIAGNOSIS — G8929 Other chronic pain: Secondary | ICD-10-CM | POA: Diagnosis not present

## 2020-05-30 DIAGNOSIS — G43909 Migraine, unspecified, not intractable, without status migrainosus: Secondary | ICD-10-CM | POA: Diagnosis not present

## 2020-05-30 DIAGNOSIS — E785 Hyperlipidemia, unspecified: Secondary | ICD-10-CM | POA: Diagnosis not present

## 2020-05-30 DIAGNOSIS — I082 Rheumatic disorders of both aortic and tricuspid valves: Secondary | ICD-10-CM | POA: Diagnosis not present

## 2020-05-30 DIAGNOSIS — I69198 Other sequelae of nontraumatic intracerebral hemorrhage: Secondary | ICD-10-CM | POA: Diagnosis not present

## 2020-06-05 ENCOUNTER — Ambulatory Visit (INDEPENDENT_AMBULATORY_CARE_PROVIDER_SITE_OTHER): Payer: Medicare Other | Admitting: Internal Medicine

## 2020-06-05 ENCOUNTER — Encounter: Payer: Self-pay | Admitting: Internal Medicine

## 2020-06-05 ENCOUNTER — Other Ambulatory Visit: Payer: Self-pay

## 2020-06-05 VITALS — BP 130/84 | HR 83 | Temp 98.3°F | Wt 132.0 lb

## 2020-06-05 DIAGNOSIS — I614 Nontraumatic intracerebral hemorrhage in cerebellum: Secondary | ICD-10-CM | POA: Diagnosis not present

## 2020-06-05 DIAGNOSIS — E876 Hypokalemia: Secondary | ICD-10-CM | POA: Diagnosis not present

## 2020-06-05 DIAGNOSIS — N289 Disorder of kidney and ureter, unspecified: Secondary | ICD-10-CM

## 2020-06-05 LAB — BASIC METABOLIC PANEL
BUN: 25 mg/dL — ABNORMAL HIGH (ref 6–23)
CO2: 26 mEq/L (ref 19–32)
Calcium: 11 mg/dL — ABNORMAL HIGH (ref 8.4–10.5)
Chloride: 104 mEq/L (ref 96–112)
Creatinine, Ser: 1.07 mg/dL (ref 0.40–1.20)
GFR: 50.47 mL/min — ABNORMAL LOW (ref >60.00)
Glucose, Bld: 94 mg/dL (ref 70–99)
Potassium: 3.4 mEq/L — ABNORMAL LOW (ref 3.5–5.1)
Sodium: 139 mEq/L (ref 135–145)

## 2020-06-05 NOTE — Patient Instructions (Signed)

## 2020-06-05 NOTE — Addendum Note (Signed)
Addended by: Jearld Fenton on: 06/05/2020 09:20 PM   Modules accepted: Orders

## 2020-06-05 NOTE — Progress Notes (Signed)
Subjective:    Patient ID: Morgan Bell, female    DOB: August 12, 1951, 69 y.o.   MRN: [id number]  HPI  Pt presents to the clinic today for hospital follow up. She went to the ER 8/1 with c/o dizziness and difficulty walking.  CT of the head was concerning for a intraparenchymal bleed of the cerebellum without shift or mass-effect.  MRI brain confirmed this.  CTA of the head and neck was negative for any acute findings.  Echocardiogram was normal.  It was felt this was possibly secondary to who medication noncompliant with her antihypertensive medication.  Her aspirin was discontinued.  She was advised to continue Amlodipine and Irbesartan for a SBP goal less than 160.  She did receive PT/OT while inpatient.  She was discharged on 8/5 to a SNF. She reports, while she was in the SNF, they had her on HCTZ 12.5 mg but she has not been on this in the last 2 dasy. Since discharge from the SNF, she reports she continues to feel weak and unsteady but denies headaches, dizziness or visual changes. She is getting home health at home. She lives alone, did drive herself today. She has a son nearby. Her BP today is 130/84. She reports her neurologist appt is 06/14/20.  Review of Systems      Past Medical History:  Diagnosis Date  . Arthritis    RA  . Exertional angina ([place]) 07/16/2018   CT coronary 09/13/18: Coronary Ca score 62.1, 68% for age/sex matched control, No evidence of obstructive CAD (1-24% LAD, RCA)  . Family history of adverse reaction to anesthesia    son has difficulty waking   . Headache(784.0)    HX MIGRAINES   . Hypertension   . Melanoma ([place])   . Obesity   . Stroke [facility])    MINI STROKE     21 YRS AGO     Current Outpatient Medications  Medication Sig Dispense Refill  . amLODipine (NORVASC) 10 MG tablet Take 0.5 tablets (5 mg total) by mouth 2 (two) times daily. 90 tablet 1  . calcium-vitamin D (OSCAL WITH D) 500-200 MG-UNIT tablet Take 1 tablet by mouth daily.    . cetirizine  (ZYRTEC) 10 MG tablet Take 10 mg by mouth daily.    . fenofibrate (TRICOR) 145 MG tablet TAKE 1 TABLET BY MOUTH DAILY (Patient taking differently: Take 145 mg by mouth daily. ) 30 tablet 11  . gabapentin (NEURONTIN) 100 MG capsule Take 100 mg by mouth 4 (four) times daily.    . irbesartan (AVAPRO) 300 MG tablet TAKE 1 TABLET BY MOUTH EVERY DAY (Patient taking differently: Take 300 mg by mouth daily. ) 30 tablet 0  . leflunomide (ARAVA) 20 MG tablet Take 20 mg daily by mouth.    Marland Kitchen OVER THE COUNTER MEDICATION Take 1 tablet by mouth in the morning, at noon, in the evening, and at bedtime. Medication: Kscurminin    . potassium chloride SA (KLOR-CON) 20 MEQ tablet Take 1 tablet (20 mEq total) by mouth daily.    . rosuvastatin (CRESTOR) 40 MG tablet TAKE 1 TABLET BY MOUTH EVERY DAY (Patient taking differently: Take 40 mg by mouth daily. ) 30 tablet 5   No current facility-administered medications for this visit.    Allergies  Allergen Reactions  . Sulfa Antibiotics Rash    SULFA DRUGS    Family History  Problem Relation Age of Onset  . Alzheimer's disease Mother   . Heart disease Father  MI, died at 30  . Alzheimer's disease Maternal Grandmother   . Cancer Maternal Grandfather        lung  . Heart attack Paternal Grandfather   . Tuberculosis Sister     Social History   Socioeconomic History  . Marital status: Divorced    Spouse name: Not on file  . Number of children: 3  . Years of education: 32  . Highest education level: Not on file  Occupational History    Employer: [employer]    Comment: Network engineer; raises service dogs.   Tobacco Use  . Smoking status: Never Smoker  . Smokeless tobacco: Never Used  Substance and Sexual Activity  . Alcohol use: No  . Drug use: No  . Sexual activity: Never  Other Topics Concern  . Not on file  Social History Narrative  . Not on file   Social Determinants of Health   Financial Resource Strain:   . Difficulty of  Paying Living Expenses: Not on file  Food Insecurity:   . Worried About Charity fundraiser in the Last Year: Not on file  . Ran Out of Food in the Last Year: Not on file  Transportation Needs:   . Lack of Transportation (Medical): Not on file  . Lack of Transportation (Non-Medical): Not on file  Physical Activity:   . Days of Exercise per Week: Not on file  . Minutes of Exercise per Session: Not on file  Stress:   . Feeling of Stress : Not on file  Social Connections:   . Frequency of Communication with Friends and Family: Not on file  . Frequency of Social Gatherings with Friends and Family: Not on file  . Attends Religious Services: Not on file  . Active Member of Clubs or Organizations: Not on file  . Attends Archivist Meetings: Not on file  . Marital Status: Not on file  Intimate Partner Violence:   . Fear of Current or Ex-Partner: Not on file  . Emotionally Abused: Not on file  . Physically Abused: Not on file  . Sexually Abused: Not on file     Constitutional: Pt reports fatigue. Denies fever, malaise, headache or abrupt weight changes.  Respiratory: Denies difficulty breathing, shortness of breath, cough or sputum production.   Cardiovascular: Denies chest pain, chest tightness, palpitations or swelling in the hands or feet.  Musculoskeletal: Denies decrease in range of motion, difficulty with gait, muscle pain or joint pain and swelling.  Skin: Denies redness, rashes, lesions or ulcercations.  Neurological: Pt reports difficulty with balance and coordination. Denies dizziness, difficulty with memory, difficulty with speech.    No other specific complaints in a complete review of systems (except as listed in HPI above).  Objective:   Physical Exam BP 130/84   Pulse 83   Temp 98.3 F (36.8 C) (Temporal)   Wt 132 lb (59.9 kg)   SpO2 97%   BMI 25.78 kg/m   Wt Readings from Last 3 Encounters:  03/21/20 135 lb (61.2 kg)  10/18/19 120 lb (54.4 kg)    10/12/19 120 lb 12.8 oz (54.8 kg)    General: Appears her stated age, well developed, well nourished in NAD. Skin: Warm, dry and intact. No rashesnoted. HEENT: Head: normal shape and size; Eyes: sclera white, no icterus, conjunctiva pink, PERRLA and EOMs intact; Cardiovascular: Normal rate and rhythm. S1,S2 noted.  No murmur, rubs or gallops noted. No JVD or BLE edema. No carotid bruits noted. Pulmonary/Chest: Normal effort  and positive vesicular breath sounds. No respiratory distress. No wheezes, rales or ronchi noted.  Musculoskeletal: Gait unsteady with use of 4 prong cane.  Neurological: Alert and oriented. Fine motor coordination decreased. Ataxic gait.   BMET    Component Value Date/Time   NA 141 05/13/2020 1310   NA 142 04/23/2018 0829   K 3.2 (L) 05/13/2020 1310   CL 105 05/13/2020 1310   CO2 23 05/13/2020 1310   GLUCOSE 136 (H) 05/13/2020 1310   BUN 20 05/13/2020 1310   BUN 24 04/23/2018 0829   CREATININE 0.94 05/13/2020 1310   CREATININE 1.45 (H) 11/10/2016 1418   CALCIUM 9.8 05/13/2020 1310   GFRNONAA >60 05/13/2020 1310   GFRAA >60 05/13/2020 1310    Lipid Panel     Component Value Date/Time   CHOL 106 05/13/2020 2253   CHOL 120 04/23/2018 0829   TRIG 168 (H) 05/13/2020 2253   HDL 23 (L) 05/13/2020 2253   HDL 17 (L) 04/23/2018 0829   CHOLHDL 4.6 05/13/2020 2253   VLDL 34 05/13/2020 2253   LDLCALC 49 05/13/2020 2253   LDLCALC 41 04/23/2018 0829    CBC    Component Value Date/Time   WBC 6.3 05/13/2020 1310   RBC 4.91 05/13/2020 1310   HGB 12.7 05/13/2020 1310   HCT 40.8 05/13/2020 1310   PLT 272 05/13/2020 1310   MCV 83.1 05/13/2020 1310   MCV 93.1 01/16/2014 2105   MCH 25.9 (L) 05/13/2020 1310   MCHC 31.1 05/13/2020 1310   RDW 16.9 (H) 05/13/2020 1310   LYMPHSABS 2.3 02/28/2013 1545   MONOABS 1.0 02/28/2013 1545   EOSABS 0.3 02/28/2013 1545   BASOSABS 0.1 02/28/2013 1545    Hgb A1C Lab Results  Component Value Date   HGBA1C 5.6 05/13/2020            Assessment & Plan:   Hospital follow-up for Hemorrhage of Cerebellum, Hypokalemia, Decreased Kidney Function:  ER notes, labs and imaging reviewed Discussed the importance of adequate BP control Continue Amlodipine and Irbesartan Reinforced DASH diet and exercise for weight loss She will follow up with neurology as an outpatient BMET today  Return precautions discussed Webb Silversmith, NP This visit occurred during the SARS-CoV-2 public health emergency.  Safety protocols were in place, including screening questions prior to the visit, additional usage of staff PPE, and extensive cleaning of exam room while observing appropriate contact time as indicated for disinfecting solutions.

## 2020-06-08 ENCOUNTER — Encounter: Payer: Self-pay | Admitting: Internal Medicine

## 2020-06-14 ENCOUNTER — Ambulatory Visit: Payer: Medicare Other | Admitting: Adult Health

## 2020-06-14 ENCOUNTER — Encounter: Payer: Self-pay | Admitting: Adult Health

## 2020-06-14 ENCOUNTER — Telehealth: Payer: Self-pay | Admitting: Adult Health

## 2020-06-14 ENCOUNTER — Other Ambulatory Visit: Payer: Self-pay

## 2020-06-14 VITALS — BP 135/91 | HR 112 | Ht 59.0 in | Wt 130.0 lb

## 2020-06-14 DIAGNOSIS — I69398 Other sequelae of cerebral infarction: Secondary | ICD-10-CM | POA: Diagnosis not present

## 2020-06-14 DIAGNOSIS — R2689 Other abnormalities of gait and mobility: Secondary | ICD-10-CM

## 2020-06-14 DIAGNOSIS — I614 Nontraumatic intracerebral hemorrhage in cerebellum: Secondary | ICD-10-CM

## 2020-06-14 DIAGNOSIS — I10 Essential (primary) hypertension: Secondary | ICD-10-CM

## 2020-06-14 DIAGNOSIS — E785 Hyperlipidemia, unspecified: Secondary | ICD-10-CM | POA: Diagnosis not present

## 2020-06-14 NOTE — Telephone Encounter (Signed)
[employer] medicare order sent to [facility]. No auth they will reach out to the patient to schedule.  

## 2020-06-14 NOTE — Progress Notes (Signed)
I agree with the above plan 

## 2020-06-14 NOTE — Progress Notes (Signed)
[facility] [address] [place] [address] [phone]       HOSPITAL FOLLOW UP NOTE  [name] Date of Birth:  January 19, 1951 Medical Record Number:  [id number]   Reason for Referral:  hospital stroke follow up    SUBJECTIVE:   CHIEF COMPLAINT:  Chief Complaint  Patient presents with  . Hospitalization Follow-up    tx rm  . Cerebrovascular Accident    Pt said she in not having any sx. Pt still has riht side weakness    HPI:   Morgan Bell a 69 y.o.femalewith history of hypertension with questionable compliance to medication, headaches, angina, rheumatoid arthritis, TIA 21 years ago with no residual deficits who presented on 05/13/2020 with dizziness and difficulty walking.  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  Aspirin PTA discontinued in setting of ICH. Hx of HTN initially on Cleviprex stabilize during admission and resumed home meds of amlodipine and Avapro. Of note, questionable medication noncompliance.  Hx of HLD with LDL 49 and recommended resuming home dose Crestor and fenofibrate at discharge due to [place] (held during admission).  Other stroke risk factors include advanced age, history of TIA and migraines.  Other active problems include RA on Arava and melanoma.  Evaluated by therapy initially recommending home health services but due to lack of assistance at home, discharge to SNF for functional decline and further therapy needs.  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 Small vessel disease. Atrophy. Sinus dz.  CTA head &neck no vascular abnormality. Mild atherosclerosis. Tortuosity.   MRI w/w/oR cerebellar hemorrhage w/ mild edema. No lesion. R basal ganglia and thalamic chronic microhemorrhages.  2D EchoEF55-60%. No source of embolus  LDL49  HgbA1c5.6  aspirin 81 mg dailyprior to  admission, now on No antithromboticgiven hemorrhage   Therapy recommendations:HH PT, OT, SLP->agreew/ SNF asassist no available  Disposition:short term SNF- lived alone PTA. Son can stay with her on the weekend  Today, 06/14/2020, Morgan Bell is being seen for hospital follow-up She has since returned home from SNF stay  Reports continued deficits imbalance, and right hand weakness  She does report improvement but not yet to baseline She is working with [facility] PT/SLP  and nurse.  Reports SLP will be continued as she previously was experiencing cognitive impairment with delayed recall but this has since improved Ambulates long distance with RW but is able to ambulate short distance without assistive device Currently living alone maintaining all ADLs and majority of IADLs without difficulty  Her son lives nearby and assist as needed Denies new or worsening stroke/TIA symptoms  Continues on Crestor and fenofibrate without side effects Blood pressure today 135/91.  Routinely monitors at home and typically 130s/90s.  Reports compliance with amlodipine 5 mg daily and Avapro 300 mg daily.   She has had follow-up with PCP since discharge  No further concerns at this time     ROS:   14 system review of systems performed and negative with exception of imbalance and weakness  PMH:  Past Medical History:  Diagnosis Date  . Arthritis    RA  . Exertional angina ([place]) 07/16/2018   CT coronary 09/13/18: Coronary Ca score 62.1, 68% for age/sex matched control, No evidence of obstructive CAD (1-24% LAD, RCA)  . Family history of adverse reaction to anesthesia    son has difficulty waking   . Headache(784.0)    HX MIGRAINES   .  Hypertension   . Melanoma ([place])   . Obesity   . Stroke [facility])    MINI STROKE     21 YRS AGO     PSH:  Past Surgical History:  Procedure Laterality Date  . APPLICATION OF ROBOTIC ASSISTANCE FOR SPINAL PROCEDURE N/A 10/18/2019   Procedure: APPLICATION OF ROBOTIC  ASSISTANCE FOR SPINAL PROCEDURE;  Surgeon: Kristeen Miss, MD;  Location: [facility];  Service: Neurosurgery;  Laterality: N/A;  . CESAREAN SECTION  11/26/1973  . CESAREAN SECTION  09/22/1975  . CESAREAN SECTION  04/30/1978  . HEMORRHOID SURGERY  06/1987  . MELANOMA EXCISION WITH SENTINEL LYMPH NODE BIOPSY Right 03/02/2013   Procedure: Wide MELANOMA EXCISION Right Abdominal wall WITH SENTINEL LYMPH NODE mapping Right Axilla;  Surgeon: Merrie Roof, MD;  Location: [facility];  Service: General;  Laterality: Right;  . NM [place]  2003   negative Bruce protocol exercise stress test with no evidence of perfusion abnormality, EF 66%  . TOTAL HIP ARTHROPLASTY Right 03/2011    Social History:  Social History   Socioeconomic History  . Marital status: Divorced    Spouse name: Not on file  . Number of children: 3  . Years of education: 68  . Highest education level: Not on file  Occupational History    Employer: [employer]    Comment: Network engineer; raises service dogs.   Tobacco Use  . Smoking status: Never Smoker  . Smokeless tobacco: Never Used  Substance and Sexual Activity  . Alcohol use: No  . Drug use: No  . Sexual activity: Never  Other Topics Concern  . Not on file  Social History Narrative  . Not on file   Social Determinants of Health   Financial Resource Strain:   . Difficulty of Paying Living Expenses: Not on file  Food Insecurity:   . Worried About Charity fundraiser in the Last Year: Not on file  . Ran Out of Food in the Last Year: Not on file  Transportation Needs:   . Lack of Transportation (Medical): Not on file  . Lack of Transportation (Non-Medical): Not on file  Physical Activity:   . Days of Exercise per Week: Not on file  . Minutes of Exercise per Session: Not on file  Stress:   . Feeling of Stress : Not on file  Social Connections:   . Frequency of Communication with Friends and Family: Not on file  . Frequency of Social Gatherings  with Friends and Family: Not on file  . Attends Religious Services: Not on file  . Active Member of Clubs or Organizations: Not on file  . Attends Archivist Meetings: Not on file  . Marital Status: Not on file  Intimate Partner Violence:   . Fear of Current or Ex-Partner: Not on file  . Emotionally Abused: Not on file  . Physically Abused: Not on file  . Sexually Abused: Not on file    Family History:  Family History  Problem Relation Age of Onset  . Alzheimer's disease Mother   . Heart disease Father        MI, died at 3  . Alzheimer's disease Maternal Grandmother   . Cancer Maternal Grandfather        lung  . Heart attack Paternal Grandfather   . Tuberculosis Sister     Medications:   Current Outpatient Medications on File Prior to Visit  Medication Sig Dispense Refill  . amLODipine (NORVASC) 10  MG tablet Take 0.5 tablets (5 mg total) by mouth 2 (two) times daily. 90 tablet 1  . calcium-vitamin D (OSCAL WITH D) 500-200 MG-UNIT tablet Take 1 tablet by mouth daily.    . cetirizine (ZYRTEC) 10 MG tablet Take 10 mg by mouth daily.    . fenofibrate (TRICOR) 145 MG tablet TAKE 1 TABLET BY MOUTH DAILY (Patient taking differently: Take 145 mg by mouth daily. ) 30 tablet 11  . gabapentin (NEURONTIN) 100 MG capsule Take 100 mg by mouth 4 (four) times daily.    . irbesartan (AVAPRO) 300 MG tablet TAKE 1 TABLET BY MOUTH EVERY DAY (Patient taking differently: Take 300 mg by mouth daily. ) 30 tablet 0  . leflunomide (ARAVA) 20 MG tablet Take 20 mg daily by mouth.    Marland Kitchen OVER THE COUNTER MEDICATION Take 1 tablet by mouth in the morning, at noon, in the evening, and at bedtime. Medication: Kscurminin    . potassium chloride SA (KLOR-CON) 20 MEQ tablet Take 1 tablet (20 mEq total) by mouth daily.    . rosuvastatin (CRESTOR) 40 MG tablet TAKE 1 TABLET BY MOUTH EVERY DAY (Patient taking differently: Take 40 mg by mouth daily. ) 30 tablet 5   No current facility-administered  medications on file prior to visit.    Allergies:   Allergies  Allergen Reactions  . Sulfa Antibiotics Rash    SULFA DRUGS      OBJECTIVE:  Physical Exam  Vitals:   06/14/20 1425  BP: (!) 135/91  Pulse: (!) 112  Weight: 130 lb (59 kg)  Height: 4\' 11"  (1.499 m)   Body mass index is 26.26 kg/m.   General: well developed, well nourished, very pleasant elderly Caucasian female, seated, in no evident distress Head: head normocephalic and atraumatic.   Neck: supple with no carotid or supraclavicular bruits Cardiovascular: regular rate and rhythm, no murmurs Musculoskeletal: no deformity Skin:  no rash/petichiae Vascular:  Normal pulses all extremities   Neurologic Exam Mental Status: Awake and fully alert.   Fluent speech and language.  Oriented to place and time. Recent and remote memory intact. Attention span, concentration and fund of knowledge appropriate. Mood and affect appropriate.  Cranial Nerves: Fundoscopic exam reveals sharp disc margins. Pupils equal, briskly reactive to light. Extraocular movements full without nystagmus. Visual fields full to confrontation. Hearing intact. Facial sensation intact. Face, tongue, palate moves normally and symmetrically.  Motor: Normal bulk and tone. Normal strength in all tested extremity muscles except slightly decreased right hand dexterity. Sensory.: intact to touch , pinprick , position and vibratory sensation.  Coordination: Rapid alternating movements normal in all extremities except slightly decreased right hand dexterity. Finger-to-nose and heel-to-shin performed accurately bilaterally. Gait and Station: Arises from chair without difficulty. Stance is slightly hunched. Gait demonstrates normal stride length and balance with use of rolling walker Reflexes: 1+ and symmetric. Toes downgoing.     NIHSS  0 Modified [place]  2     ASSESSMENT: Morgan Bell is a 69 y.o. 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 Vascular risk factors include HTN, HLD, advanced age, history of TIA (21 years ago) and migraine headaches.      PLAN:  1. Hypertensive R cerebellar ICH:  a. Residual deficit: Imbalance with gait impairment and decreased right hand dexterity.   i. Continue participation with home health therapy and transition to outpatient therapy if indicated once completed b.  Repeat CT head and if resolution or near resolution of prior ICH, would recommend restarting aspirin 81 mg daily for secondary stroke prevention with history of TIA.   c. Continue Crestor 40 mg daily and fenofibrate for secondary stroke prevention.  d. Close PCP follow up for aggressive stroke risk factor management  2. HTN:  a. BP goal <130/90.  b. Stable c. Discussed importance of medication compliance and routine follow-up with PCP for management 3. HLD:  a. LDL goal <70. Recent LDL 49.  b. Continue Crestor and fenofibrate management prescriber PCP    Follow up in 3 months or call earlier if needed   I spent 45 minutes of face-to-face and non-face-to-face time with patient.  This included previsit chart review, lab review, study review, order entry, electronic health record documentation, patient education regarding recent stroke, residual deficits, importance of managing stroke risk factors, indication for repeating CT scan and answered all questions to patient satisfaction     Frann Rider, [facility]  [facility] [address] [place], [place] [address]  Phone [phone] Fax [phone] Note: This document was prepared with digital dictation and possible smart phrase technology. Any transcriptional errors that result from this process are unintentional.

## 2020-06-14 NOTE — Patient Instructions (Signed)
We will repeat CT head and if hemorrhage resolved, will likely recommend restarting aspirin 81mg  daily for stroke prevention  Continue working with therapies for ongoing improvement of your balance and strength   Continue Crestor and fenofibrate for secondary stroke prevention  Continue to follow up with PCP regarding cholesterol and blood pressure management  Maintain strict control of hypertension with blood pressure goal below 130/90 and cholesterol with LDL cholesterol (bad cholesterol) goal below 70 mg/dL.       Followup in the future with me in 3 months or call earlier if needed       Thank you for coming to see [place] at [facility]. I hope we have been able to provide you high quality care today.  You may receive a patient satisfaction survey over the next few weeks. We would appreciate your feedback and comments so that we may continue to improve ourselves and the health of our patients.

## 2020-06-22 ENCOUNTER — Ambulatory Visit
Admission: RE | Admit: 2020-06-22 | Discharge: 2020-06-22 | Disposition: A | Discharge status: Home / Self Care | Payer: Medicare Other | Source: Ambulatory Visit | Attending: Adult Health | Admitting: Adult Health

## 2020-06-22 DIAGNOSIS — I614 Nontraumatic intracerebral hemorrhage in cerebellum: Secondary | ICD-10-CM

## 2020-06-25 ENCOUNTER — Other Ambulatory Visit: Payer: Self-pay | Admitting: Adult Health

## 2020-06-25 ENCOUNTER — Telehealth: Payer: Self-pay

## 2020-06-25 MED ORDER — ASPIRIN EC 81 MG PO TBEC: 81.0000 mg | DELAYED_RELEASE_TABLET | Freq: Every day | ORAL | 11 refills | Status: AC

## 2020-06-25 NOTE — Telephone Encounter (Signed)
Attempted to call the patient re: message below. There was no VM setup for me to leave a message. Will try again tomorrow

## 2020-06-25 NOTE — Telephone Encounter (Signed)
-----   Message from Frann Rider, NP sent at 06/25/2020 12:03 PM EDT ----- Please advise patient that recent imaging showed resolution of prior ICH and recommend restarting aspirin 81 mg daily for secondary stroke prevention

## 2020-06-27 ENCOUNTER — Other Ambulatory Visit: Payer: Self-pay | Admitting: Internal Medicine

## 2020-06-27 ENCOUNTER — Telehealth: Payer: Self-pay

## 2020-06-27 NOTE — Telephone Encounter (Signed)
Attempted to call the patient w/o success. No VM was setup.  Spoke to pts son Collier [place] on the [place]. He was notified of the message below

## 2020-06-27 NOTE — Telephone Encounter (Signed)
-----   Message from Frann Rider, NP sent at 06/25/2020 12:03 PM EDT ----- Please advise patient that recent imaging showed resolution of prior ICH and recommend restarting aspirin 81 mg daily for secondary stroke prevention

## 2020-06-29 ENCOUNTER — Telehealth: Payer: Self-pay

## 2020-06-29 NOTE — Telephone Encounter (Signed)
Ok for [facility] speech therapy

## 2020-06-29 NOTE — Telephone Encounter (Signed)
Rx has been filled.... need to talk to pt about lab results, she needs to schedule lab only appt

## 2020-06-29 NOTE — Telephone Encounter (Signed)
Patient called and says her pharmacy has been trying for 2 days to get her potassium refilled but has not rec'd a response. [facility] [address] of [address] and [address] Please advise. Pt is out of medication.

## 2020-06-29 NOTE — Telephone Encounter (Signed)
[place] therapist with [facility] left v/m requesting to continue [facility] speech therapy 1 x a wk for 2 wks.

## 2020-07-02 ENCOUNTER — Other Ambulatory Visit: Payer: Self-pay | Admitting: Cardiovascular Disease

## 2020-07-02 ENCOUNTER — Telehealth: Payer: Self-pay

## 2020-07-02 NOTE — Telephone Encounter (Signed)
Attempted to call the patient without success.  LM on the VM for the patient to call back re: recent results.  **If the patient calls back please relay the message below from Frann Rider, NP

## 2020-07-02 NOTE — Telephone Encounter (Signed)
-----   Message from Frann Rider, NP sent at 06/25/2020 12:03 PM EDT ----- Please advise patient that recent imaging showed resolution of prior ICH and recommend restarting aspirin 81 mg daily for secondary stroke prevention

## 2020-07-02 NOTE — Telephone Encounter (Signed)
[place] Night - Client TELEPHONE ADVICE RECORD [facility] Patient Name: Morgan Bell Gender: Female DOB: 05/31/1951 Age: 69 Y 61 M 12 D Return Phone Number: [phone] (Primary) Address: City/State/Zip: [place] [address] Client [facility] Night - Client Client Site [place] Physician Webb Silversmith - NP Contact Type Call Who Is Calling Patient / Member / Family / Caregiver Call Type Triage / Clinical Relationship To Patient Self Return Phone Number [phone] (Primary) Chief Complaint Prescription Refill or Medication Request (non symptomatic) Reason for Call Medication Question / Request Initial Comment Caller is out of potassium chloride and it hasn't been called in yet. CBWN: Jenell Milliner says she is still waiting and was never informed how long the wait would be, nothing has changed, sending chart back to follow up [place] No Nurse Assessment Nurse: Hassell Done, RN, Mateo Flow Date/Time (Eastern Time): 06/29/2020 8:13:09 PM Confirm and document reason for call. If symptomatic, describe symptoms. ---Caller is out of potassium chloride and it hasn't been called in yet. [facility] on [address]. She is not having any new or worsening symptoms. [facility] to [facility], they advised it had already been called in and was ready for pickup. Has the patient had close contact with a person known or suspected to have the novel coronavirus illness OR traveled / lives in area with major community spread (including international travel) in the last 14 days from the onset of symptoms? * If Asymptomatic, screen for exposure and travel within the last 14 days. ---No Does the patient have any new or worsening symptoms? ---No Disp. Time Eilene Ghazi Time) Disposition Final User 06/29/2020 8:12:05 PM Send To RN Personal Stephanie Coup, RN, Barnetta Chapel 06/29/2020 8:25:49 PM Pharmacy Call  Hassell Done, RN, Mateo Flow Reason: Called to verify Rx, and pharmacist let me know it had a refill waiting on her. 06/29/2020 8:26:09 PM Clinical Call Yes Hassell Done, RN, Mateo Flow

## 2020-07-03 ENCOUNTER — Telehealth: Payer: Self-pay

## 2020-07-03 NOTE — Telephone Encounter (Signed)
[place] therapist with [facility] left v/m requesting verbal orders for [facility] speech therapy 1 x a wk for 2 wks.

## 2020-07-03 NOTE — Telephone Encounter (Signed)
Ok for [facility] speech therapy orders as requested.

## 2020-07-03 NOTE — Telephone Encounter (Signed)
VO given.

## 2020-07-05 DIAGNOSIS — M069 Rheumatoid arthritis, unspecified: Secondary | ICD-10-CM | POA: Diagnosis not present

## 2020-07-05 DIAGNOSIS — C4359 Malignant melanoma of other part of trunk: Secondary | ICD-10-CM | POA: Diagnosis not present

## 2020-07-05 DIAGNOSIS — R2689 Other abnormalities of gait and mobility: Secondary | ICD-10-CM | POA: Diagnosis not present

## 2020-07-05 DIAGNOSIS — I10 Essential (primary) hypertension: Secondary | ICD-10-CM | POA: Diagnosis not present

## 2020-07-05 DIAGNOSIS — I082 Rheumatic disorders of both aortic and tricuspid valves: Secondary | ICD-10-CM | POA: Diagnosis not present

## 2020-07-05 DIAGNOSIS — R41841 Cognitive communication deficit: Secondary | ICD-10-CM | POA: Diagnosis not present

## 2020-07-05 DIAGNOSIS — I69198 Other sequelae of nontraumatic intracerebral hemorrhage: Secondary | ICD-10-CM | POA: Diagnosis not present

## 2020-07-05 DIAGNOSIS — Z9181 History of falling: Secondary | ICD-10-CM | POA: Diagnosis not present

## 2020-07-05 DIAGNOSIS — I209 Angina pectoris, unspecified: Secondary | ICD-10-CM | POA: Diagnosis not present

## 2020-07-05 DIAGNOSIS — G8929 Other chronic pain: Secondary | ICD-10-CM | POA: Diagnosis not present

## 2020-07-05 DIAGNOSIS — R42 Dizziness and giddiness: Secondary | ICD-10-CM | POA: Diagnosis not present

## 2020-07-05 DIAGNOSIS — M4800 Spinal stenosis, site unspecified: Secondary | ICD-10-CM | POA: Diagnosis not present

## 2020-07-05 DIAGNOSIS — G43909 Migraine, unspecified, not intractable, without status migrainosus: Secondary | ICD-10-CM | POA: Diagnosis not present

## 2020-07-05 DIAGNOSIS — E785 Hyperlipidemia, unspecified: Secondary | ICD-10-CM | POA: Diagnosis not present

## 2020-07-13 ENCOUNTER — Other Ambulatory Visit: Payer: Self-pay | Admitting: Cardiovascular Disease

## 2020-07-14 ENCOUNTER — Other Ambulatory Visit: Payer: Self-pay | Admitting: Cardiovascular Disease

## 2020-07-26 ENCOUNTER — Other Ambulatory Visit: Payer: Self-pay | Admitting: Cardiovascular Disease

## 2020-07-30 ENCOUNTER — Other Ambulatory Visit: Payer: Self-pay | Admitting: Cardiovascular Disease

## 2020-07-30 ENCOUNTER — Other Ambulatory Visit: Payer: Self-pay | Admitting: Internal Medicine

## 2020-08-01 MED ORDER — POTASSIUM CHLORIDE CRYS ER 20 MEQ PO TBCR: 20.0000 meq | EXTENDED_RELEASE_TABLET | Freq: Two times a day (BID) | ORAL | 0 refills | Status: DC

## 2020-08-01 NOTE — Telephone Encounter (Signed)
Called pt received refill request, Pt is overdue for repeat labs and never saw lab results released to mychart--- pt needs lab only appt ASAP to recheck labs  No answer and VM not set up

## 2020-08-02 ENCOUNTER — Telehealth: Payer: Self-pay | Admitting: Cardiovascular Disease

## 2020-08-02 ENCOUNTER — Telehealth: Payer: Self-pay

## 2020-08-02 NOTE — Telephone Encounter (Signed)
*  STAT* If patient is at the pharmacy, call can be transferred to refill team.   1. Which medications need to be refilled? (please list name of each medication and dose if known) amLODipine (NORVASC) 10 MG tablet  2. Which pharmacy/location (including street and city if local pharmacy) is medication to be sent to? [facility]ORE [place], [place]  3. Do they need a 30 day or 90 day supply? 90 day   Patient is out of medication. Patient states she read the instructions wrong and has been taking 1 tablet twice a day in stead of half a tablet so she is now out.

## 2020-08-02 NOTE — Telephone Encounter (Signed)
15 tabs of amlodipine 10mg  was sent in to pharmacy 07/31/20. Pt was last seen on 02/02/19, needs appt for further refills.Marland KitchenMarland KitchenMarland Kitchen

## 2020-08-16 ENCOUNTER — Other Ambulatory Visit: Payer: Self-pay

## 2020-08-16 DIAGNOSIS — M5136 Other intervertebral disc degeneration, lumbar region: Secondary | ICD-10-CM | POA: Diagnosis not present

## 2020-08-16 DIAGNOSIS — M150 Primary generalized (osteo)arthritis: Secondary | ICD-10-CM | POA: Diagnosis not present

## 2020-08-16 DIAGNOSIS — M0589 Other rheumatoid arthritis with rheumatoid factor of multiple sites: Secondary | ICD-10-CM | POA: Diagnosis not present

## 2020-08-16 DIAGNOSIS — Z79899 Other long term (current) drug therapy: Secondary | ICD-10-CM | POA: Diagnosis not present

## 2020-08-16 MED ORDER — AMLODIPINE BESYLATE 10 MG PO TABS: ORAL_TABLET | ORAL | 0 refills | Status: DC

## 2020-08-31 ENCOUNTER — Other Ambulatory Visit: Payer: Self-pay | Admitting: Cardiovascular Disease

## 2020-09-03 ENCOUNTER — Telehealth: Payer: Self-pay | Admitting: Cardiovascular Disease

## 2020-09-03 ENCOUNTER — Telehealth: Payer: Self-pay | Admitting: Adult Health

## 2020-09-03 MED ORDER — AMLODIPINE BESYLATE 10 MG PO TABS: ORAL_TABLET | ORAL | 0 refills | Status: DC

## 2020-09-03 NOTE — Telephone Encounter (Signed)
Pt called, checking on the status of disability forms faxed over by [employer]. Would like a call from the nurse.

## 2020-09-03 NOTE — Telephone Encounter (Signed)
*  STAT* If patient is at the pharmacy, call can be transferred to refill team.   1. Which medications need to be refilled? (please list name of each medication and dose if known)  amLODipine (NORVASC) 10 MG tablet  2. Which pharmacy/location (including street and city if local pharmacy) is medication to be sent to? [facility] STORE [place], [place]  3. Do they need a 30 day or 90 day supply? 90 day supply  Pt is scheduled for an appt 11/06/19.

## 2020-09-03 NOTE — Telephone Encounter (Signed)
Called pt to let her know that I have refilled her amlodipine through her next visit with Dr. Oval Linsey. Advised pt to let the physician know about any medications that need refills at that visit. The patient verbalizes understanding and agreement with plan.

## 2020-09-07 ENCOUNTER — Other Ambulatory Visit: Payer: Self-pay | Admitting: Internal Medicine

## 2020-10-11 ENCOUNTER — Ambulatory Visit: Payer: Medicare Other | Admitting: Adult Health

## 2020-10-18 ENCOUNTER — Ambulatory Visit (INDEPENDENT_AMBULATORY_CARE_PROVIDER_SITE_OTHER): Payer: Medicare Other | Admitting: Adult Health

## 2020-10-18 ENCOUNTER — Encounter: Payer: Self-pay | Admitting: Adult Health

## 2020-10-18 ENCOUNTER — Other Ambulatory Visit: Payer: Self-pay

## 2020-10-18 VITALS — BP 127/84 | HR 96 | Ht 59.0 in | Wt 135.2 lb

## 2020-10-18 DIAGNOSIS — E785 Hyperlipidemia, unspecified: Secondary | ICD-10-CM | POA: Diagnosis not present

## 2020-10-18 DIAGNOSIS — I10 Essential (primary) hypertension: Secondary | ICD-10-CM

## 2020-10-18 DIAGNOSIS — I614 Nontraumatic intracerebral hemorrhage in cerebellum: Secondary | ICD-10-CM | POA: Diagnosis not present

## 2020-10-18 MED ORDER — GABAPENTIN 300 MG PO CAPS: 300.0000 mg | ORAL_CAPSULE | Freq: Three times a day (TID) | ORAL | 3 refills | Status: DC

## 2020-10-18 NOTE — Patient Instructions (Addendum)
Continue aspirin 81 mg daily  and Crestor and fenofibrate for secondary stroke prevention  Continue gabapentin - new rx provided for gabapentin 300mg  three times daily   Continue to follow-up with cardiology as scheduled  Continue to follow up with PCP regarding cholesterol and blood pressure management  Maintain strict control of hypertension with blood pressure goal below 130/90, diabetes with hemoglobin A1c goal below 6.5% and cholesterol with LDL cholesterol (bad cholesterol) goal below 70 mg/dL.       Followup in the future with me in 6 months or call earlier if needed       Thank you for coming to see at [facility]. I hope we have been able to provide you high quality care today.  You may receive a patient satisfaction survey over the next few weeks. We would appreciate your feedback and comments so that we may continue to improve ourselves and the health of our patients.

## 2020-10-18 NOTE — Progress Notes (Signed)
[facility] [address] [place]. [place] [address] [phone]       STROKE FOLLOW UP NOTE  [name] Date of Birth:  1950/12/13 Medical Record Number:  [id number]   Reason for Referral: stroke follow up    SUBJECTIVE:   CHIEF COMPLAINT:  Chief Complaint  Patient presents with  . Follow-up    Three month follow up - CVA. She walks slowly and uses a cane for assistance. Denies any falls. She has noticed worsening back pain. She was taking gabapentin 100mg , one capsule QID. She increased the dose on her own. She is now taking three capsules in the am, two capsules midday and three capsules QHS. It is prescribed by her rheumatologist.     HPI:   Today, 10/18/2020, [name] returns for prolonged 11-month stroke follow-up with prior visit on 06/14/2020.  Residual gait impairment with some improvement and right hand decreased dexterity stable without worsening. Currently using a cane for ambulation but has recently been experiencing worsening back pain.  History of back pain undergoing surgical procedure approximately 1 year ago.  She was unsure pain related to prior back procedure or poststroke due to gait abnormality.  Previously prescribed gabapentin 100 mg 4 times daily but self increased currently taking 300/200/300 with great benefit.  She has been able to increase activity and denies any side effects. Denies new stroke/TIA symptoms.  Repeat CT head 06/2020 showed resolution of prior ICH therefore restarted aspirin 81 mg daily for secondary stroke prevention.  She has remained on aspirin 81 mg daily, Crestor and fenofibrate without side effects.  Blood pressure today 127/84. Monitors at home which has been stable.  No further concerns at this time.    History provided for reference purposes only Initial visit 06/14/2020 JM: [name] is being seen for hospital follow-up She has since returned home from SNF stay Reports continued deficits imbalance, and  right hand weakness  She does report improvement but not yet to baseline She is working with [facility] PT/SLP  and nurse.  Reports SLP will be continued as she previously was experiencing cognitive impairment with delayed recall but this has since improved Ambulates long distance with RW but is able to ambulate short distance without assistive device Currently living alone maintaining all ADLs and majority of IADLs without difficulty  Her son lives nearby and assist as needed Denies new or worsening stroke/TIA symptoms Continues on Crestor and fenofibrate without side effects Blood pressure today 135/91.  Routinely monitors at home and typically 130s/90s.  Reports compliance with amlodipine 5 mg daily and Avapro 300 mg daily.   She has had follow-up with PCP since discharge No further concerns at this time  Stroke admission 05/13/2020 Morgan L Bradleyis a 69 y.o.femalewith history of hypertension with questionable compliance to medication, headaches, angina, rheumatoid arthritis, TIA 21 years ago with no residual deficits who presented on 05/13/2020 with dizziness and difficulty walking.  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  Aspirin PTA discontinued in setting of ICH. Hx of HTN initially on Cleviprex stabilize during admission and resumed home meds of amlodipine and Avapro. Of note, questionable medication noncompliance.  Hx of HLD with LDL 49 and recommended resuming home dose Crestor and fenofibrate at discharge due to ICH (held during admission).  Other stroke risk factors include advanced age, history of TIA and migraines.  Other active problems include RA on Arava and melanoma.  Evaluated by therapy initially recommending home health services but due  to lack of assistance at home, discharge to SNF for functional decline and further therapy needs.  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 Small vessel disease. Atrophy. Sinus dz.  CTA head &neck no vascular abnormality. Mild atherosclerosis. Tortuosity.   MRI w/w/oR cerebellar hemorrhage w/ mild edema. No lesion. R basal ganglia and thalamic chronic microhemorrhages.  2D EchoEF55-60%. No source of embolus  LDL49  HgbA1c5.6  aspirin 81 mg dailyprior to admission, now on No antithromboticgiven hemorrhage   Therapy recommendations:[facility] PT, OT, SLP->agreew/ SNF asassist no available  Disposition:short term SNF- lived alone PTA. Son can stay with her on the weekend    ROS:   14 system review of systems performed and negative with exception of those listed in HPI  PMH:  Past Medical History:  Diagnosis Date  . Arthritis    RA  . Exertional angina ([place]) 07/16/2018   CT coronary 09/13/18: Coronary Ca score 62.1, 68% for age/sex matched control, No evidence of obstructive CAD (1-24% LAD, RCA)  . Family history of adverse reaction to anesthesia    son has difficulty waking   . Headache(784.0)    HX MIGRAINES   . Hypertension   . Melanoma ([place])   . Obesity   . Stroke [facility])    MINI STROKE     21 YRS AGO     PSH:  Past Surgical History:  Procedure Laterality Date  . APPLICATION OF ROBOTIC ASSISTANCE FOR SPINAL PROCEDURE N/A 10/18/2019   Procedure: APPLICATION OF ROBOTIC ASSISTANCE FOR SPINAL PROCEDURE;  Surgeon: Kristeen Miss, MD;  Location: [place];  Service: Neurosurgery;  Laterality: N/A;  . CESAREAN SECTION  11/26/1973  . CESAREAN SECTION  09/22/1975  . CESAREAN SECTION  04/30/1978  . HEMORRHOID SURGERY  06/1987  . MELANOMA EXCISION WITH SENTINEL LYMPH NODE BIOPSY Right 03/02/2013   Procedure: Wide MELANOMA EXCISION Right Abdominal wall WITH SENTINEL LYMPH NODE mapping Right Axilla;  Surgeon: Merrie Roof, MD;  Location: [place];  Service: General;  Laterality: Right;  . NM [place]  2003   negative Bruce protocol exercise stress test with no evidence of perfusion  abnormality, EF 66%  . TOTAL HIP ARTHROPLASTY Right 03/2011    Social History:  Social History   Socioeconomic History  . Marital status: Divorced    Spouse name: Not on file  . Number of children: 3  . Years of education: 74  . Highest education level: Not on file  Occupational History    Employer: [employer]    Comment: Network engineer; raises service dogs.   Tobacco Use  . Smoking status: Never Smoker  . Smokeless tobacco: Never Used  Substance and Sexual Activity  . Alcohol use: No  . Drug use: No  . Sexual activity: Never  Other Topics Concern  . Not on file  Social History Narrative  . Not on file   Social Determinants of Health   Financial Resource Strain: Not on file  Food Insecurity: Not on file  Transportation Needs: Not on file  Physical Activity: Not on file  Stress: Not on file  Social Connections: Not on file  Intimate Partner Violence: Not on file    Family History:  Family History  Problem Relation Age of Onset  . Alzheimer's disease Mother   . Heart disease Father        MI, died at 79  . Alzheimer's disease Maternal Grandmother   .  Cancer Maternal Grandfather        lung  . Heart attack Paternal Grandfather   . Tuberculosis Sister     Medications:   Current Outpatient Medications on File Prior to Visit  Medication Sig Dispense Refill  . amLODipine (NORVASC) 10 MG tablet TAKE 1/2 TABLET(5 MG) BY MOUTH TWICE DAILY 70 tablet 0  . aspirin EC 81 MG tablet Take 1 tablet (81 mg total) by mouth daily. Swallow whole. 30 tablet 11  . calcium-vitamin D (OSCAL WITH D) 500-200 MG-UNIT tablet Take 1 tablet by mouth daily.    . cetirizine (ZYRTEC) 10 MG tablet Take 10 mg by mouth daily.    . fenofibrate (TRICOR) 145 MG tablet TAKE 1 TABLET BY MOUTH DAILY (Patient taking differently: Take 145 mg by mouth daily.) 30 tablet 11  . gabapentin (NEURONTIN) 100 MG capsule Take 100 mg by mouth 4 (four) times daily.    . irbesartan (AVAPRO) 300 MG tablet  TAKE 1 TABLET BY MOUTH EVERY DAY 30 tablet 8  . leflunomide (ARAVA) 20 MG tablet Take 20 mg daily by mouth.    Marland Kitchen OVER THE COUNTER MEDICATION Take 1 tablet by mouth in the morning, at noon, in the evening, and at bedtime. Medication: Kscurminin    . potassium chloride SA (KLOR-CON) 20 MEQ tablet TAKE 1 TABLET(20 MEQ) BY MOUTH TWICE DAILY 60 tablet 0  . rosuvastatin (CRESTOR) 40 MG tablet TAKE 1 TABLET BY MOUTH EVERY DAY 30 tablet 5   No current facility-administered medications on file prior to visit.    Allergies:   Allergies  Allergen Reactions  . Sulfa Antibiotics Rash    SULFA DRUGS      OBJECTIVE:  Physical Exam  Vitals:   10/18/20 1351  BP: 127/84  Pulse: 96  Weight: 135 lb 3.2 oz (61.3 kg)  Height: 4\' 11"  (1.499 m)   Body mass index is 27.31 kg/m.  General: well developed, well nourished, very pleasant elderly Caucasian female, seated, in no evident distress Head: head normocephalic and atraumatic.   Neck: supple with no carotid or supraclavicular bruits Cardiovascular: regular rate and rhythm, no murmurs Musculoskeletal: no deformity Skin:  no rash/petichiae Vascular:  Normal pulses all extremities   Neurologic Exam Mental Status: Awake and fully alert.  Fluent speech and language. Oriented to place and time. Recent and remote memory intact. Attention span, concentration and fund of knowledge appropriate. Mood and affect appropriate.  Cranial Nerves: Pupils equal, briskly reactive to light. Extraocular movements full without nystagmus. Visual fields full to confrontation. Hearing intact. Facial sensation intact. Face, tongue, palate moves normally and symmetrically.  Motor: Normal bulk and tone. Normal strength in all tested extremity muscles except slightly decreased right hand dexterity. Sensory.: intact to touch , pinprick , position and vibratory sensation.  Coordination: Rapid alternating movements normal in all extremities except slightly decreased right hand  dexterity. Finger-to-nose and heel-to-shin performed accurately bilaterally. Gait and Station: Arises from chair without difficulty. Stance is slightly hunched. Gait demonstrates  broad-based gait with normal stride length and use of cane. Reflexes: 1+ and symmetric. Toes downgoing.        ASSESSMENT: PANG GODINHO is a 70 y.o. 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 Vascular risk factors include HTN, HLD, advanced age, history of TIA (21 years ago) and migraine headaches.      PLAN:  1. Hypertensive R cerebellar ICH:  a. Residual deficit: gait  impairment and decreased right hand dexterity.   i. Discussed continued exercises at home and use of cane at all times for fall prevention ii. Lower back pain: Possibly poststroke in setting of gait impairment but also chronic history of lower back pain.  Continue gabapentin and recommend 300 mg 3 times daily -new refill provided b. Repeat CT head 06/22/2020 resolution of ICH c. Continue aspirin 81 mg daily and Crestor 40 mg daily and fenofibrate for secondary stroke prevention.  d. Discussed secondary stroke prevention measures and importance of close PCP follow up for aggressive stroke risk factor management  2. HTN:  a. BP goal <130/90. Stable on amlodipine and irbesartan per PCP/cardiology 3. HLD:  a. LDL goal <70. Recent LDL 49.  On Crestor 40 mg daily and fenofibrate 145 mg daily per cardiology.     Follow up in 6 months or call earlier if needed   CC:  [facility] provider: Dr. Lemmie Evens, Coralie Keens, NP    I spent 30 minutes of face-to-face and non-face-to-face time with patient.  This included previsit chart review, lab review, study review, order entry, electronic health record documentation, patient education regarding prior stroke and residual deficits, lower back pain, importance of managing stroke risk factors, and  answered all other questions to patient satisfaction   Frann Rider, AGNP-BC  [facility] [address] [place], [place] [address]  Phone [phone] Fax [phone] Note: This document was prepared with digital dictation and possible smart phrase technology. Any transcriptional errors that result from this process are unintentional.

## 2020-10-18 NOTE — Progress Notes (Signed)
I agree with the above plan 

## 2020-10-22 DIAGNOSIS — Z0289 Encounter for other administrative examinations: Secondary | ICD-10-CM

## 2020-10-23 ENCOUNTER — Telehealth: Payer: Self-pay | Admitting: *Deleted

## 2020-10-23 NOTE — Telephone Encounter (Signed)
Received [employer] form (initial disability statement).  First saw 06-14-20 for stroke 05-13-20.

## 2020-10-25 NOTE — Telephone Encounter (Signed)
Signed and placed in outbox.  Thank you. ?

## 2020-11-05 ENCOUNTER — Ambulatory Visit: Payer: Medicare Other | Admitting: Cardiovascular Disease

## 2020-11-05 ENCOUNTER — Other Ambulatory Visit: Payer: Self-pay

## 2020-11-05 ENCOUNTER — Encounter: Payer: Self-pay | Admitting: Cardiovascular Disease

## 2020-11-05 VITALS — BP 138/100 | HR 90 | Ht 60.0 in | Wt 133.6 lb

## 2020-11-05 DIAGNOSIS — G459 Transient cerebral ischemic attack, unspecified: Secondary | ICD-10-CM | POA: Diagnosis not present

## 2020-11-05 DIAGNOSIS — E785 Hyperlipidemia, unspecified: Secondary | ICD-10-CM

## 2020-11-05 DIAGNOSIS — I10 Essential (primary) hypertension: Secondary | ICD-10-CM

## 2020-11-05 DIAGNOSIS — I614 Nontraumatic intracerebral hemorrhage in cerebellum: Secondary | ICD-10-CM

## 2020-11-05 MED ORDER — AMLODIPINE BESYLATE 10 MG PO TABS: ORAL_TABLET | ORAL | 3 refills | Status: DC

## 2020-11-05 NOTE — Telephone Encounter (Signed)
Pt returned RN's call. Please call back.

## 2020-11-05 NOTE — Patient Instructions (Signed)
Medication Instructions:  Your physician recommends that you continue on your current medications as directed. Please refer to the Current Medication list given to you today.  *If you need a refill on your cardiac medications before your next appointment, please call your pharmacy*  Lab Work: NONE  Testing/Procedures: NONE  Follow-Up: At [facility], you and your health needs are our priority.  As part of our continuing mission to provide you with exceptional heart care, we have created designated Provider Care Teams.  These Care Teams include your primary Cardiologist (physician) and Advanced Practice Providers (APPs -  Physician Assistants and Nurse Practitioners) who all work together to provide you with the care you need, when you need it.  We recommend signing up for the patient portal called "MyChart".  Sign up information is provided on this After Visit Summary.  MyChart is used to connect with patients for Virtual Visits (Telemedicine).  Patients are able to view lab/test results, encounter notes, upcoming appointments, etc.  Non-urgent messages can be sent to your provider as well.   To learn more about what you can do with MyChart, go to [email or web address].    Your next appointment:   12 month(s)  The format for your next appointment:   In Person  Provider:   You may see Tiffany Midway, MD or one of the following Advanced Practice Providers on your designated Care Team:    Luke Kilroy, PA-C  Callie Goodrich, PA-C  Jesse Cleaver, FNP     

## 2020-11-05 NOTE — Progress Notes (Signed)
Cardiology Office Note    Evaluation Performed:  Follow-up visit  Date:  11/06/2020   ID:  Morgan Bell 1950/12/25, MRN [id number]  PCP:  Morgan Fenton, NP  Cardiologist:  Morgan Latch, MD  Electrophysiologist:  None   Chief Complaint:  Follow up  History of Present Illness:    Morgan Bell is a 70 y.o. female with mild, non-obstructive CAD, hypertension, hyperlipiemia, RA, prior stroke (age 58, no residual deficits) and obesity who presents for follow up.  She was previously a patient of Morgan Bell.  She last saw Morgan Bell 10/2016 and reported mild exertional dyspnea that was overall stable. She also noted mild lower extremity edema that was attributed to amlodipine.  She was noted to have severely elevated triglycerides and was started on fenofibrate with an improvement from 1118 to 384.  Her blood pressure was poorly-controlled.  Enalapril was switched to irbesartan.  She was also started on rosuvastatin given her hypertriglyceridemia and Rheumatoid arthritis.  She had repeat labs 01/2017 that showed her triglycerides had increased from 384 to 586 despite taking her medication as prescribed.  Morgan Bell reported feeling exhausted after exercise and had some exertional chest pressure. She recalls having a stress test with Dr. Rex Bell when she was much younger and in better shape.  At that time it was exhausting for her and she is afraid of repeating her stress test.  She is also afraid of pharmacologic stress testing.    She was referred for coronary CT-A that revealed mild (1 to 25%) disease in the LAD and RCA but was otherwise unremarkable.  Coronary calcium score was 68th percentile.  She also continues to complain of lower extremity edema and had lower extremity Dopplers that were negative for DVT.  Since then she saw her PCP and thought that her edema was due to amlodipine.  She continued to take amlodipine but started taking 7 mushrooms and CoQ-10 and no longer has edema.   At her last appointment her BP was well-controlled.   Since her last appointment she had a stroke 05/2020.  She had an ICH.  She presented with dizziness.  She has residual shaking and weakness in her R hand.  She has no chest pain and her breathing is stable.  She has no LE edema, orthopnea or PND.  Lately her BP has been in the 120s-130s/70-80.  Today she is in pain from missing her gabapentin dose.  She thinks this is why her blood pressure is higher.  She has been otherwise well.  She is not getting much exercise.  She has no chest pain or shortness of breath.  She denies lower extremity edema, orthopnea, or PND.   Past Medical History:  Diagnosis Date  . Arthritis    RA  . Exertional angina ([place]) 07/16/2018   CT coronary 09/13/18: Coronary Ca score 62.1, 68% for age/sex matched control, No evidence of obstructive CAD (1-24% LAD, RCA)  . Family history of adverse reaction to anesthesia    son has difficulty waking   . Headache(784.0)    HX MIGRAINES   . Hypertension   . Melanoma ([place])   . Obesity   . Stroke [facility])    MINI STROKE     21 YRS AGO    Past Surgical History:  Procedure Laterality Date  . APPLICATION OF ROBOTIC ASSISTANCE FOR SPINAL PROCEDURE N/A 10/18/2019   Procedure: APPLICATION OF ROBOTIC ASSISTANCE FOR SPINAL PROCEDURE;  Surgeon: Kristeen Miss, MD;  Location: [facility]  OR;  Service: Neurosurgery;  Laterality: N/A;  . CESAREAN SECTION  11/26/1973  . CESAREAN SECTION  09/22/1975  . CESAREAN SECTION  04/30/1978  . HEMORRHOID SURGERY  06/1987  . MELANOMA EXCISION WITH SENTINEL LYMPH NODE BIOPSY Right 03/02/2013   Procedure: Wide MELANOMA EXCISION Right Abdominal wall WITH SENTINEL LYMPH NODE mapping Right Axilla;  Surgeon: Merrie Roof, MD;  Location: [place];  Service: General;  Laterality: Right;  . NM [place]  2003   negative Bruce protocol exercise stress test with no evidence of perfusion abnormality, EF 66%  . TOTAL HIP ARTHROPLASTY Right 03/2011      Current Meds  Medication Sig  . aspirin EC 81 MG tablet Take 1 tablet (81 mg total) by mouth daily. Swallow whole.  . calcium-vitamin D (OSCAL WITH D) 500-200 MG-UNIT tablet Take 1 tablet by mouth daily.  . cetirizine (ZYRTEC) 10 MG tablet Take 10 mg by mouth daily.  . fenofibrate (TRICOR) 145 MG tablet TAKE 1 TABLET BY MOUTH DAILY  . gabapentin (NEURONTIN) 300 MG capsule Take 1 capsule (300 mg total) by mouth 3 (three) times daily.  . irbesartan (AVAPRO) 300 MG tablet TAKE 1 TABLET BY MOUTH EVERY DAY  . leflunomide (ARAVA) 20 MG tablet Take 20 mg daily by mouth.  Marland Kitchen OVER THE COUNTER MEDICATION Take 1 tablet by mouth in the morning, at noon, in the evening, and at bedtime. Medication: Kscurminin  . potassium chloride SA (KLOR-CON) 20 MEQ tablet TAKE 1 TABLET(20 MEQ) BY MOUTH TWICE DAILY  . rosuvastatin (CRESTOR) 40 MG tablet TAKE 1 TABLET BY MOUTH EVERY DAY  . [DISCONTINUED] amLODipine (NORVASC) 10 MG tablet TAKE 1/2 TABLET(5 MG) BY MOUTH TWICE DAILY     Allergies:   Sulfa antibiotics   Social History   Tobacco Use  . Smoking status: Never Smoker  . Smokeless tobacco: Never Used  Substance Use Topics  . Alcohol use: No  . Drug use: No     Family Hx: The patient's family history includes Alzheimer's disease in her maternal grandmother and mother; Cancer in her maternal grandfather; Heart attack in her paternal grandfather; Heart disease in her father; Tuberculosis in her sister.  ROS:   Please see the history of present illness.     All other systems reviewed and are negative.   Prior CV studies:   The following studies were reviewed today:  Coronary CT-A 09/2018:  Minimal RCA and LAD disease.  IMPRESSION: 1. Coronary calcium score of 62.1. This was 68th percentile for age and sex matched control. 2. Normal coronary origin with right dominance. 3. No evidence of obstructive CAD.  LE Doppler 07/2018: Negative for DVT  Labs/Other Tests and Data Reviewed:    EKG:   An ECG dated 11/05/20 was personally reviewed today and demonstrated:  sinus rhythm.  rate 90 bpm.  LVH with secondary repolarization abnormality  Recent Labs: 03/21/2020: ALT 8 05/13/2020: Hemoglobin 12.7; Platelets 272 06/05/2020: BUN 25; Creatinine, Ser 1.07; Potassium 3.4; Sodium 139   Recent Lipid Panel Lab Results  Component Value Date/Time   CHOL 106 05/13/2020 10:53 PM   CHOL 120 04/23/2018 08:29 AM   TRIG 168 (H) 05/13/2020 10:53 PM   HDL 23 (L) 05/13/2020 10:53 PM   HDL 17 (L) 04/23/2018 08:29 AM   CHOLHDL 4.6 05/13/2020 10:53 PM   LDLCALC 49 05/13/2020 10:53 PM   LDLCALC 41 04/23/2018 08:29 AM   LDLDIRECT 57.0 09/06/2018 12:42 PM    Wt Readings from Last 3  Encounters:  11/05/20 133 lb 9.6 oz (60.6 kg)  10/18/20 135 lb 3.2 oz (61.3 kg)  06/14/20 130 lb (59 kg)     Objective:    VS:  BP (!) 138/100   Pulse 90   Ht 5' (1.524 m)   Wt 133 lb 9.6 oz (60.6 kg)   BMI 26.09 kg/m  , BMI Body mass index is 26.09 kg/m. GENERAL:  Chronically ill-appearing.  No acute distress HEENT: Pupils equal round and reactive, fundi not visualized, oral mucosa unremarkable NECK:  No jugular venous distention, waveform within normal limits, carotid upstroke brisk and symmetric, no bruits, no thyromegaly LYMPHATICS:  No cervical adenopathy LUNGS:  Clear to auscultation bilaterally HEART:  RRR.  PMI not displaced or sustained,S1 and S2 within normal limits, no S3, no S4, no clicks, no rubs, no murmurs ABD:  Flat, positive bowel sounds normal in frequency in pitch, no bruits, no rebound, no guarding, no midline pulsatile mass, no hepatomegaly, no splenomegaly EXT:  2 plus pulses throughout, no edema, no cyanosis no clubbing SKIN:  No rashes no nodules NEURO:  Cranial nerves II through XII grossly intact.  Right upper extremity weakness. PSYCH:  Cognitively intact, oriented to person place and time    ASSESSMENT & PLAN:    # Hypertriglyceridemia:  # Mixed hyperlipidemia:   Lipids are  better controlled.  LDL was 49 and triglycerides were 168 on 05/2020.  Continue rosuvastatin and fenofibrate.  Consider switching to Vascepa in the future.  # Non-obstructive CAD: Minimal disease on CT.  Continue lipid management as above and aspirin.  # Hypertension: Blood pressure was elevated today, but she attributes that to being in a lot of pain.  In general it has been well have been controlled.  She will continue to monitor at home.  Continue amlodipine and irbesartan.    # LLE edema: Resolved.   # ICH:  It is unclear what caused her intracerebral hemorrhage.  In general her blood pressures been well-controlled.  Continue to monitor for now.    Medication Adjustments/Labs and Tests Ordered: Current medicines are reviewed at length with the patient today.  Concerns regarding medicines are outlined above.   Tests Ordered: Orders Placed This Encounter  Procedures  . EKG 12-Lead    Medication Changes: Meds ordered this encounter  Medications  . amLODipine (NORVASC) 10 MG tablet    Sig: TAKE 1/2 TABLET(5 MG) BY MOUTH TWICE DAILY    Dispense:  90 tablet    Refill:  3    Disposition:  Follow up in 1 year(s)  Signed, Morgan Latch, MD  11/06/2020 8:04 AM    [place]

## 2020-11-05 NOTE — Telephone Encounter (Signed)
Went back to JM/NP about returning to work question.  Per JM/NP call pt and ask, never dicussed in the office.  Pt may need appt to discuss.  I tried to call and was not able to LVM. (VM not set up).

## 2020-11-06 NOTE — Telephone Encounter (Signed)
Pt called again wanting to speak to RN. Please call back as soon as available.

## 2020-11-06 NOTE — Telephone Encounter (Signed)
I called pt and relayed that since JM/NP has not discussed disability with pt previously she will need her to come in , so appt made tomorrow 11-07-20 at [place].  She works at [employer].  She has not been back to work since 05-13-20.

## 2020-11-07 ENCOUNTER — Encounter: Payer: Self-pay | Admitting: Adult Health

## 2020-11-07 ENCOUNTER — Ambulatory Visit: Payer: Medicare Other | Admitting: Adult Health

## 2020-11-07 VITALS — BP 138/87 | HR 94 | Ht 60.0 in | Wt 132.8 lb

## 2020-11-07 DIAGNOSIS — R269 Unspecified abnormalities of gait and mobility: Secondary | ICD-10-CM | POA: Diagnosis not present

## 2020-11-07 DIAGNOSIS — I69398 Other sequelae of cerebral infarction: Secondary | ICD-10-CM

## 2020-11-07 DIAGNOSIS — I614 Nontraumatic intracerebral hemorrhage in cerebellum: Secondary | ICD-10-CM | POA: Diagnosis not present

## 2020-11-07 DIAGNOSIS — R2689 Other abnormalities of gait and mobility: Secondary | ICD-10-CM | POA: Diagnosis not present

## 2020-11-07 NOTE — Progress Notes (Signed)
[facility] [address] [place]. [place] [address] [phone]       STROKE FOLLOW UP NOTE  [name] Date of Birth:  1951/07/02 Medical Record Number:  [id number]   Reason for Referral: stroke follow up    SUBJECTIVE:   CHIEF COMPLAINT:  Chief Complaint  Patient presents with  . Follow-up    RM 14 alone PT is doing well.     HPI:   Today, 11/07/2020, [name] returns to discuss disability paperwork.  She has not returned back to work since her stroke on 05/13/2020 due to residual gait impairment, imbalance and decreased right hand dexterity.  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  She is currently ambulating with a four-point cane and has difficulty with longer distance ambulation.  She was previously using single-point cane after her lumbar laminectomy in 10/2019 but was able to ambulate longer distance without difficulty.  She does not feel as though she would be able to return back to work at this point and be able to maintain current job requirements.  She is hopeful that she will be able to return to work at some point and wishes to wait for at least 1 full year after her stroke for max potential recovery.   History provided for reference purposes only Update 10/18/2020 JM: [name] returns for prolonged 93-month stroke follow-up with prior visit on 06/14/2020.  Residual gait impairment with some improvement and right hand decreased dexterity stable without worsening. Currently using a cane for ambulation but has recently been experiencing worsening back pain.  History of back pain undergoing surgical procedure approximately 1 year ago.  She was unsure pain related to prior back procedure or poststroke due to gait abnormality.  Previously prescribed gabapentin 100 mg 4 times daily but self increased currently taking 300/200/300 with great benefit.  She has been able to increase activity and denies  any side effects. Denies new stroke/TIA symptoms.  Repeat CT head 06/2020 showed resolution of prior ICH therefore restarted aspirin 81 mg daily for secondary stroke prevention.  She has remained on aspirin 81 mg daily, Crestor and fenofibrate without side effects.  Blood pressure today 127/84. Monitors at home which has been stable.  No further concerns at this time.  Initial visit 06/14/2020 JM: [name] is being seen for hospital follow-up She has since returned home from SNF stay Reports continued deficits imbalance, and right hand weakness  She does report improvement but not yet to baseline She is working with [facility] PT/SLP  and nurse.  Reports SLP will be continued as she previously was experiencing cognitive impairment with delayed recall but this has since improved Ambulates long distance with RW but is able to ambulate short distance without assistive device Currently living alone maintaining all ADLs and majority of IADLs without difficulty  Her son lives nearby and assist as needed Denies new or worsening stroke/TIA symptoms Continues on Crestor and fenofibrate without side effects Blood pressure today 135/91.  Routinely monitors at home and typically 130s/90s.  Reports compliance with amlodipine 5 mg daily and Avapro 300 mg daily.   She has had follow-up with PCP since discharge No further concerns at this time  Stroke admission 05/13/2020 Ms.Morgan L Bradleyis a 70 y.o.femalewith history of hypertension with questionable compliance to medication, headaches, angina, rheumatoid arthritis, TIA 21 years ago with no residual deficits who presented on 05/13/2020 with dizziness and difficulty walking.  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  Aspirin PTA discontinued in setting of ICH. Hx of HTN initially on Cleviprex stabilize during admission and resumed home meds of amlodipine and Avapro. Of note, questionable medication noncompliance.  Hx of HLD with LDL  49 and recommended resuming home dose Crestor and fenofibrate at discharge due to [place] (held during admission).  Other stroke risk factors include advanced age, history of TIA and migraines.  Other active problems include RA on Arava and melanoma.  Evaluated by therapy initially recommending home health services but due to lack of assistance at home, discharge to SNF for functional decline and further therapy needs.  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 Small vessel disease. Atrophy. Sinus dz.  CTA head &neck no vascular abnormality. Mild atherosclerosis. Tortuosity.   MRI w/w/oR cerebellar hemorrhage w/ mild edema. No lesion. R basal ganglia and thalamic chronic microhemorrhages.  2D EchoEF55-60%. No source of embolus  LDL49  HgbA1c5.6  aspirin 81 mg dailyprior to admission, now on No antithromboticgiven hemorrhage   Therapy recommendations:HH PT, OT, SLP->agreew/ SNF asassist no available  Disposition:short term SNF- lived alone PTA. Son can stay with her on the weekend    ROS:   14 system review of systems performed and negative with exception of those listed in HPI  PMH:  Past Medical History:  Diagnosis Date  . Arthritis    RA  . Exertional angina ([place]) 07/16/2018   CT coronary 09/13/18: Coronary Ca score 62.1, 68% for age/sex matched control, No evidence of obstructive CAD (1-24% LAD, RCA)  . Family history of adverse reaction to anesthesia    son has difficulty waking   . Headache(784.0)    HX MIGRAINES   . Hypertension   . Melanoma ([place])   . Obesity   . Stroke [facility])    MINI STROKE     21 YRS AGO     PSH:  Past Surgical History:  Procedure Laterality Date  . APPLICATION OF ROBOTIC ASSISTANCE FOR SPINAL PROCEDURE N/A 10/18/2019   Procedure: APPLICATION OF ROBOTIC ASSISTANCE FOR SPINAL PROCEDURE;  Surgeon: Kristeen Miss, MD;  Location: [facility];  Service: Neurosurgery;   Laterality: N/A;  . CESAREAN SECTION  11/26/1973  . CESAREAN SECTION  09/22/1975  . CESAREAN SECTION  04/30/1978  . HEMORRHOID SURGERY  06/1987  . MELANOMA EXCISION WITH SENTINEL LYMPH NODE BIOPSY Right 03/02/2013   Procedure: Wide MELANOMA EXCISION Right Abdominal wall WITH SENTINEL LYMPH NODE mapping Right Axilla;  Surgeon: Merrie Roof, MD;  Location: [facility];  Service: General;  Laterality: Right;  . NM [place]  2003   negative Bruce protocol exercise stress test with no evidence of perfusion abnormality, EF 66%  . TOTAL HIP ARTHROPLASTY Right 03/2011    Social History:  Social History   Socioeconomic History  . Marital status: Divorced    Spouse name: Not on file  . Number of children: 3  . Years of education: 33  . Highest education level: Not on file  Occupational History    Employer:  [employer]    Comment: Network engineer; raises service dogs.   Tobacco Use  . Smoking status: Never Smoker  . Smokeless tobacco: Never Used  Substance and Sexual Activity  . Alcohol use: No  . Drug use: No  . Sexual activity: Never  Other Topics Concern  . Not on file  Social History Narrative  . Not on file  Social Determinants of Health   Financial Resource Strain: Not on file  Food Insecurity: Not on file  Transportation Needs: Not on file  Physical Activity: Not on file  Stress: Not on file  Social Connections: Not on file  Intimate Partner Violence: Not on file    Family History:  Family History  Problem Relation Age of Onset  . Alzheimer's disease Mother   . Heart disease Father        MI, died at 90  . Alzheimer's disease Maternal Grandmother   . Cancer Maternal Grandfather        lung  . Heart attack Paternal Grandfather   . Tuberculosis Sister     Medications:   Current Outpatient Medications on File Prior to Visit  Medication Sig Dispense Refill  . amLODipine (NORVASC) 10 MG tablet TAKE 1/2 TABLET(5 MG) BY MOUTH TWICE DAILY 90 tablet  3  . aspirin EC 81 MG tablet Take 1 tablet (81 mg total) by mouth daily. Swallow whole. 30 tablet 11  . calcium-vitamin D (OSCAL WITH D) 500-200 MG-UNIT tablet Take 1 tablet by mouth daily.    . cetirizine (ZYRTEC) 10 MG tablet Take 10 mg by mouth daily.    . fenofibrate (TRICOR) 145 MG tablet TAKE 1 TABLET BY MOUTH DAILY 30 tablet 11  . gabapentin (NEURONTIN) 300 MG capsule Take 1 capsule (300 mg total) by mouth 3 (three) times daily. 270 capsule 3  . irbesartan (AVAPRO) 300 MG tablet TAKE 1 TABLET BY MOUTH EVERY DAY 30 tablet 8  . leflunomide (ARAVA) 20 MG tablet Take 20 mg daily by mouth.    Marland Kitchen OVER THE COUNTER MEDICATION Take 1 tablet by mouth in the morning, at noon, in the evening, and at bedtime. Medication: Kscurminin    . potassium chloride SA (KLOR-CON) 20 MEQ tablet TAKE 1 TABLET(20 MEQ) BY MOUTH TWICE DAILY 60 tablet 0  . rosuvastatin (CRESTOR) 40 MG tablet TAKE 1 TABLET BY MOUTH EVERY DAY 30 tablet 5   No current facility-administered medications on file prior to visit.    Allergies:   Allergies  Allergen Reactions  . Sulfa Antibiotics Rash    SULFA DRUGS      OBJECTIVE:  Physical Exam  Vitals:   11/07/20 0858  BP: 138/87  Pulse: 94  Weight: 132 lb 12.8 oz (60.2 kg)  Height: 5' (1.524 m)   Body mass index is 25.94 kg/m.  General: well developed, well nourished, very pleasant elderly Caucasian female, seated, in no evident distress Head: head normocephalic and atraumatic.   Neck: supple with no carotid or supraclavicular bruits Cardiovascular: regular rate and rhythm, no murmurs Musculoskeletal: no deformity Skin:  no rash/petichiae Vascular:  Normal pulses all extremities   Neurologic Exam Mental Status: Awake and fully alert.  Fluent speech and language. Oriented to place and time. Recent and remote memory intact. Attention span, concentration and fund of knowledge appropriate. Mood and affect appropriate.  Cranial Nerves: Pupils equal, briskly reactive to  light. Extraocular movements full without nystagmus. Visual fields full to confrontation. Hearing intact. Facial sensation intact. Face, tongue, palate moves normally and symmetrically.  Motor: Normal bulk and tone. Normal strength in all tested extremity muscles except slightly decreased right hand dexterity and fine motor control (dominant hand). Sensory.: intact to touch , pinprick , position and vibratory sensation.  Coordination: Rapid alternating movements normal in all extremities except slightly decreased right hand dexterity. Finger-to-nose and heel-to-shin performed accurately bilaterally. Gait and Station: Arises from chair without difficulty. Stance  is slightly hunched. Gait demonstrates  broad-based gait with decreased stride length and mild imbalance/unsteadiness and use of four-point cane. Reflexes: 1+ and symmetric. Toes downgoing.        ASSESSMENT: Morgan Bell is a 70 y.o. 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 Vascular risk factors include HTN, HLD, advanced age, history of TIA (21 years ago) and migraine headaches.      PLAN:  1. Hypertensive R cerebellar ICH:  a. Residual deficit: gait impairment with imbalance and decreased right hand dexterity.   i. Will assist patient with completion of short-term disability paperwork.  We will potentially need to transition to long-term disability but she will contact her HR department to discuss this further ii. Lower back pain: Possibly poststroke in setting of gait impairment but also chronic history of lower back pain.  Continue gabapentin and recommend 300 mg 3 times daily as this has been beneficial in pain b. Repeat CT head 06/22/2020 resolution of ICH c. Continue aspirin 81 mg daily and Crestor 40 mg daily and fenofibrate for secondary stroke prevention.  d. Discussed secondary stroke prevention  measures and importance of close PCP follow up for aggressive stroke risk factor management  2. HTN:  a. BP goal <130/90. Stable on amlodipine and irbesartan per PCP/cardiology 3. HLD:  a. LDL goal <70. Recent LDL 49.  On Crestor 40 mg daily and fenofibrate 145 mg daily per cardiology.     Follow-up as scheduled in July for previously planned 52-month follow-up and to discuss potential return back to work   CC:  [place] provider: Dr. Lemmie Evens, Coralie Keens, NP    I spent 20 minutes of face-to-face and non-face-to-face time with patient.  This included previsit chart review, lab review, study review, order entry, electronic health record documentation, patient education regarding prior stroke with residual deficits and indication for short-term disability   Frann Rider, [facility]  [facility] [address] [place], [place] [address]  Phone [phone] Fax [phone] Note: This document was prepared with digital dictation and possible smart phrase technology. Any transcriptional errors that result from this process are unintentional.

## 2020-11-07 NOTE — Telephone Encounter (Signed)
Completed the disability p/w and signed.  To MR.

## 2020-11-12 NOTE — Progress Notes (Signed)
I agree with the above plan 

## 2020-11-13 ENCOUNTER — Telehealth: Payer: Self-pay | Admitting: *Deleted

## 2020-11-13 NOTE — Telephone Encounter (Signed)
I faxed patient physicians statement [phone]

## 2020-11-15 ENCOUNTER — Other Ambulatory Visit: Payer: Self-pay | Admitting: Internal Medicine

## 2020-11-21 ENCOUNTER — Other Ambulatory Visit: Payer: Self-pay | Admitting: Cardiovascular Disease

## 2020-11-29 ENCOUNTER — Telehealth: Payer: Self-pay | Admitting: Adult Health

## 2020-11-29 NOTE — Telephone Encounter (Signed)
As she was purposely taking extra capsules, I am not able to refill early with either 300 mg capsules or 100 mg capsules.  She will have to further decrease the dosage to taking 1 or 2 capsules daily as needed as we are not able to refill until April.

## 2020-11-29 NOTE — Telephone Encounter (Signed)
I called pt and she states that she started taking 1200mg  po daily after she was seen here 11-07-20, then for the last 2-3 wks is taking 900mg  daily.  Asking for 100mg  caps. She says she has a weeks worth left.  I relayed that I would send message to Jm/NP. This may be addressed Monday.

## 2020-11-29 NOTE — Telephone Encounter (Signed)
Pt request refill gabapentin (NEURONTIN) 300 MG capsule at [facility] #[id number]

## 2020-11-29 NOTE — Telephone Encounter (Signed)
At recent visit, she confirmed taking 300mg  3 times daily. She was previously taking 300mg  AM, 200mg  afternoon and 300mg  PM with 100mg  capsules per rheumatology but at prior visit, I prescribed her 300 mg capsules taking 3 times daily.  We did specifically discuss new dosage of capsule and will only need to take 1 capsule 3 times daily -please confirm that she did not get this confused and has been taking 3 300mg  capsules 3 times daily.

## 2020-11-29 NOTE — Telephone Encounter (Signed)
Prescription was sent 10-18-2020 for one year at [facility].  Tried calling pt and no VM set up.i called her again.  Pt stated she needed refill on her gabapentin.  She then stated her rhuematologist Dr. Leafy Kindle (on [place]) prescribed 100mg   Qid received #30 10-02-20.  Her last fill with [place] was 300mg  po tid #270 10-19-2020.  Please advise if ok. She said was taking 300mg  po TID.  (It was confusing talking to her udnerstanding what she is taking).

## 2020-12-03 NOTE — Telephone Encounter (Signed)
I called pt and let her know that we cannot refill until the allotted time.  She was gvien #270 caps of 300mg  TID dosing, if she was taking more then prescribed then she will have to wait.  Are there other bottles misplaced or person living with her help her find them?  She may need to reach out to rheumatology/pcp that was giving her 100mg  caps if she needs them as we cannot refill early.  She said ok.

## 2020-12-11 DIAGNOSIS — M0589 Other rheumatoid arthritis with rheumatoid factor of multiple sites: Secondary | ICD-10-CM | POA: Diagnosis not present

## 2021-01-12 ENCOUNTER — Emergency Department (HOSPITAL_COMMUNITY): Payer: Medicare Other

## 2021-01-12 ENCOUNTER — Inpatient Hospital Stay (HOSPITAL_COMMUNITY)
Admission: EM | Admit: 2021-01-12 | Discharge: 2021-01-22 | DRG: 519 | Disposition: A | Discharge status: Skilled Nursing Facility | Payer: Medicare Other | Attending: Neurological Surgery | Admitting: Neurological Surgery

## 2021-01-12 ENCOUNTER — Ambulatory Visit (HOSPITAL_COMMUNITY)
Admission: EM | Admit: 2021-01-12 | Discharge: 2021-01-12 | Payer: Medicare Other | Attending: Medical Oncology | Admitting: Medical Oncology

## 2021-01-12 ENCOUNTER — Encounter (HOSPITAL_COMMUNITY): Payer: Self-pay

## 2021-01-12 ENCOUNTER — Other Ambulatory Visit: Payer: Self-pay

## 2021-01-12 ENCOUNTER — Ambulatory Visit (INDEPENDENT_AMBULATORY_CARE_PROVIDER_SITE_OTHER): Payer: Medicare Other

## 2021-01-12 ENCOUNTER — Encounter (HOSPITAL_COMMUNITY): Payer: Self-pay | Admitting: *Deleted

## 2021-01-12 DIAGNOSIS — M25572 Pain in left ankle and joints of left foot: Secondary | ICD-10-CM | POA: Diagnosis not present

## 2021-01-12 DIAGNOSIS — Z981 Arthrodesis status: Secondary | ICD-10-CM

## 2021-01-12 DIAGNOSIS — G959 Disease of spinal cord, unspecified: Secondary | ICD-10-CM | POA: Diagnosis present

## 2021-01-12 DIAGNOSIS — W19XXXA Unspecified fall, initial encounter: Secondary | ICD-10-CM | POA: Diagnosis not present

## 2021-01-12 DIAGNOSIS — R296 Repeated falls: Secondary | ICD-10-CM | POA: Diagnosis not present

## 2021-01-12 DIAGNOSIS — R41841 Cognitive communication deficit: Secondary | ICD-10-CM | POA: Diagnosis not present

## 2021-01-12 DIAGNOSIS — Z96641 Presence of right artificial hip joint: Secondary | ICD-10-CM | POA: Diagnosis not present

## 2021-01-12 DIAGNOSIS — S92351D Displaced fracture of fifth metatarsal bone, right foot, subsequent encounter for fracture with routine healing: Secondary | ICD-10-CM | POA: Diagnosis not present

## 2021-01-12 DIAGNOSIS — R279 Unspecified lack of coordination: Secondary | ICD-10-CM | POA: Diagnosis not present

## 2021-01-12 DIAGNOSIS — R29818 Other symptoms and signs involving the nervous system: Secondary | ICD-10-CM | POA: Diagnosis not present

## 2021-01-12 DIAGNOSIS — Z20822 Contact with and (suspected) exposure to covid-19: Secondary | ICD-10-CM | POA: Diagnosis not present

## 2021-01-12 DIAGNOSIS — E785 Hyperlipidemia, unspecified: Secondary | ICD-10-CM | POA: Diagnosis not present

## 2021-01-12 DIAGNOSIS — M25561 Pain in right knee: Secondary | ICD-10-CM | POA: Diagnosis not present

## 2021-01-12 DIAGNOSIS — Z7982 Long term (current) use of aspirin: Secondary | ICD-10-CM | POA: Diagnosis not present

## 2021-01-12 DIAGNOSIS — R531 Weakness: Secondary | ICD-10-CM | POA: Diagnosis not present

## 2021-01-12 DIAGNOSIS — Z7401 Bed confinement status: Secondary | ICD-10-CM | POA: Diagnosis not present

## 2021-01-12 DIAGNOSIS — S22069A Unspecified fracture of T7-T8 vertebra, initial encounter for closed fracture: Secondary | ICD-10-CM | POA: Diagnosis not present

## 2021-01-12 DIAGNOSIS — Z8673 Personal history of transient ischemic attack (TIA), and cerebral infarction without residual deficits: Secondary | ICD-10-CM | POA: Diagnosis not present

## 2021-01-12 DIAGNOSIS — M25571 Pain in right ankle and joints of right foot: Secondary | ICD-10-CM | POA: Diagnosis not present

## 2021-01-12 DIAGNOSIS — Z85820 Personal history of malignant melanoma of skin: Secondary | ICD-10-CM

## 2021-01-12 DIAGNOSIS — M069 Rheumatoid arthritis, unspecified: Secondary | ICD-10-CM | POA: Diagnosis not present

## 2021-01-12 DIAGNOSIS — G319 Degenerative disease of nervous system, unspecified: Secondary | ICD-10-CM | POA: Diagnosis not present

## 2021-01-12 DIAGNOSIS — R32 Unspecified urinary incontinence: Secondary | ICD-10-CM | POA: Diagnosis present

## 2021-01-12 DIAGNOSIS — M2550 Pain in unspecified joint: Secondary | ICD-10-CM | POA: Diagnosis not present

## 2021-01-12 DIAGNOSIS — G43909 Migraine, unspecified, not intractable, without status migrainosus: Secondary | ICD-10-CM | POA: Diagnosis not present

## 2021-01-12 DIAGNOSIS — M25562 Pain in left knee: Secondary | ICD-10-CM

## 2021-01-12 DIAGNOSIS — M6281 Muscle weakness (generalized): Secondary | ICD-10-CM | POA: Diagnosis not present

## 2021-01-12 DIAGNOSIS — Z882 Allergy status to sulfonamides status: Secondary | ICD-10-CM

## 2021-01-12 DIAGNOSIS — R52 Pain, unspecified: Secondary | ICD-10-CM | POA: Diagnosis not present

## 2021-01-12 DIAGNOSIS — E669 Obesity, unspecified: Secondary | ICD-10-CM | POA: Diagnosis present

## 2021-01-12 DIAGNOSIS — M4854XA Collapsed vertebra, not elsewhere classified, thoracic region, initial encounter for fracture: Secondary | ICD-10-CM | POA: Diagnosis present

## 2021-01-12 DIAGNOSIS — Z79899 Other long term (current) drug therapy: Secondary | ICD-10-CM

## 2021-01-12 DIAGNOSIS — Z6825 Body mass index (BMI) 25.0-25.9, adult: Secondary | ICD-10-CM | POA: Diagnosis not present

## 2021-01-12 DIAGNOSIS — G9529 Other cord compression: Secondary | ICD-10-CM | POA: Diagnosis not present

## 2021-01-12 DIAGNOSIS — S22060A Wedge compression fracture of T7-T8 vertebra, initial encounter for closed fracture: Secondary | ICD-10-CM | POA: Diagnosis not present

## 2021-01-12 DIAGNOSIS — Z4789 Encounter for other orthopedic aftercare: Secondary | ICD-10-CM | POA: Diagnosis not present

## 2021-01-12 DIAGNOSIS — M48062 Spinal stenosis, lumbar region with neurogenic claudication: Secondary | ICD-10-CM | POA: Diagnosis not present

## 2021-01-12 DIAGNOSIS — R209 Unspecified disturbances of skin sensation: Secondary | ICD-10-CM

## 2021-01-12 DIAGNOSIS — R2681 Unsteadiness on feet: Secondary | ICD-10-CM | POA: Diagnosis not present

## 2021-01-12 DIAGNOSIS — Z9181 History of falling: Secondary | ICD-10-CM | POA: Diagnosis not present

## 2021-01-12 DIAGNOSIS — G9520 Unspecified cord compression: Secondary | ICD-10-CM | POA: Diagnosis not present

## 2021-01-12 DIAGNOSIS — S92811A Other fracture of right foot, initial encounter for closed fracture: Secondary | ICD-10-CM | POA: Diagnosis not present

## 2021-01-12 DIAGNOSIS — S22079A Unspecified fracture of T9-T10 vertebra, initial encounter for closed fracture: Secondary | ICD-10-CM | POA: Diagnosis not present

## 2021-01-12 DIAGNOSIS — M4804 Spinal stenosis, thoracic region: Secondary | ICD-10-CM | POA: Diagnosis not present

## 2021-01-12 DIAGNOSIS — M5124 Other intervertebral disc displacement, thoracic region: Secondary | ICD-10-CM | POA: Diagnosis not present

## 2021-01-12 DIAGNOSIS — I639 Cerebral infarction, unspecified: Secondary | ICD-10-CM | POA: Diagnosis not present

## 2021-01-12 DIAGNOSIS — M7989 Other specified soft tissue disorders: Secondary | ICD-10-CM | POA: Diagnosis not present

## 2021-01-12 DIAGNOSIS — M545 Low back pain, unspecified: Secondary | ICD-10-CM | POA: Diagnosis not present

## 2021-01-12 DIAGNOSIS — I10 Essential (primary) hypertension: Secondary | ICD-10-CM | POA: Diagnosis present

## 2021-01-12 DIAGNOSIS — Z8249 Family history of ischemic heart disease and other diseases of the circulatory system: Secondary | ICD-10-CM | POA: Diagnosis not present

## 2021-01-12 DIAGNOSIS — R262 Difficulty in walking, not elsewhere classified: Secondary | ICD-10-CM

## 2021-01-12 DIAGNOSIS — C4359 Malignant melanoma of other part of trunk: Secondary | ICD-10-CM | POA: Diagnosis not present

## 2021-01-12 DIAGNOSIS — S92354A Nondisplaced fracture of fifth metatarsal bone, right foot, initial encounter for closed fracture: Secondary | ICD-10-CM

## 2021-01-12 DIAGNOSIS — M5104 Intervertebral disc disorders with myelopathy, thoracic region: Principal | ICD-10-CM | POA: Diagnosis present

## 2021-01-12 DIAGNOSIS — M810 Age-related osteoporosis without current pathological fracture: Secondary | ICD-10-CM | POA: Diagnosis present

## 2021-01-12 DIAGNOSIS — Z419 Encounter for procedure for purposes other than remedying health state, unspecified: Secondary | ICD-10-CM

## 2021-01-12 DIAGNOSIS — S92351A Displaced fracture of fifth metatarsal bone, right foot, initial encounter for closed fracture: Secondary | ICD-10-CM | POA: Diagnosis not present

## 2021-01-12 DIAGNOSIS — I499 Cardiac arrhythmia, unspecified: Secondary | ICD-10-CM | POA: Diagnosis not present

## 2021-01-12 LAB — CBC WITH DIFFERENTIAL/PLATELET
Abs Immature Granulocytes: 0.04 10*3/uL (ref 0.00–0.07)
Basophils Absolute: 0.1 10*3/uL (ref 0.0–0.1)
Basophils Relative: 1 %
Eosinophils Absolute: 0.1 10*3/uL (ref 0.0–0.5)
Eosinophils Relative: 1 %
HCT: 41 % (ref 36.0–46.0)
Hemoglobin: 13.2 g/dL (ref 12.0–15.0)
Immature Granulocytes: 1 %
Lymphocytes Relative: 8 %
Lymphs Abs: 0.6 10*3/uL — ABNORMAL LOW (ref 0.7–4.0)
MCH: 29.6 pg (ref 26.0–34.0)
MCHC: 32.2 g/dL (ref 30.0–36.0)
MCV: 91.9 fL (ref 80.0–100.0)
Monocytes Absolute: 1 10*3/uL (ref 0.1–1.0)
Monocytes Relative: 13 %
Neutro Abs: 5.5 10*3/uL (ref 1.7–7.7)
Neutrophils Relative %: 76 %
Platelets: 139 10*3/uL — ABNORMAL LOW (ref 150–400)
RBC: 4.46 MIL/uL (ref 3.87–5.11)
RDW: 17.8 % — ABNORMAL HIGH (ref 11.5–15.5)
WBC: 7.3 10*3/uL (ref 4.0–10.5)
nRBC: 0 % (ref 0.0–0.2)

## 2021-01-12 LAB — COMPREHENSIVE METABOLIC PANEL
ALT: 30 U/L (ref 0–44)
AST: 61 U/L — ABNORMAL HIGH (ref 15–41)
Albumin: 3.9 g/dL (ref 3.5–5.0)
Alkaline Phosphatase: 107 U/L (ref 38–126)
Anion gap: 12 (ref 5–15)
BUN: 26 mg/dL — ABNORMAL HIGH (ref 8–23)
CO2: 21 mmol/L — ABNORMAL LOW (ref 22–32)
Calcium: 9.2 mg/dL (ref 8.9–10.3)
Chloride: 107 mmol/L (ref 98–111)
Creatinine, Ser: 1.26 mg/dL — ABNORMAL HIGH (ref 0.44–1.00)
GFR, Estimated: 45 mL/min — ABNORMAL LOW (ref >60)
Glucose, Bld: 97 mg/dL (ref 70–99)
Potassium: 3.7 mmol/L (ref 3.5–5.1)
Sodium: 140 mmol/L (ref 135–145)
Total Bilirubin: 1.2 mg/dL (ref 0.3–1.2)
Total Protein: 7.2 g/dL (ref 6.5–8.1)

## 2021-01-12 LAB — URINALYSIS, ROUTINE W REFLEX MICROSCOPIC
Bacteria, UA: NONE SEEN
Bilirubin Urine: NEGATIVE
Glucose, UA: NEGATIVE mg/dL
Hgb urine dipstick: NEGATIVE
Ketones, ur: 5 mg/dL — AB
Nitrite: NEGATIVE
Protein, ur: 30 mg/dL — AB
Specific Gravity, Urine: 1.029 (ref 1.005–1.030)
pH: 5 (ref 5.0–8.0)

## 2021-01-12 LAB — RESP PANEL BY RT-PCR (FLU A&B, COVID) ARPGX2
Influenza A by PCR: NEGATIVE
Influenza B by PCR: NEGATIVE
SARS Coronavirus 2 by RT PCR: NEGATIVE

## 2021-01-12 MED ORDER — GADOBUTROL 1 MMOL/ML IV SOLN
6.0000 mL | Freq: Once | INTRAVENOUS | Status: AC | PRN
  Administered 2021-01-12: 6 mL via INTRAVENOUS

## 2021-01-12 NOTE — ED Notes (Signed)
Provider spoke to [facility].  Paperwork provided.

## 2021-01-12 NOTE — Discharge Instructions (Addendum)
EM[facility] will transport to the hospital

## 2021-01-12 NOTE — ED Provider Notes (Addendum)
[place]    CSN: [id number] Arrival date & time: 01/12/21  1233      History   Chief Complaint Chief Complaint  Patient presents with  . Ankle Pain  . Fall  . Knee Pain    Lt/RT    HPI Morgan Bell is a 70 y.o. female.   HPI   Pain: Patient with a complex medical history states that last Saturday she fell as she was trying to get her puppy up onto her bed.  She had to call her son to come get her to help her up.  She states that during this accident she had her knees her ankles and her feet.  She states that since this time she has been falling at least 3 times per day.  Prior to this she had not fallen in a long time.  She is having significant weakness of the legs and loss of control of motor function of the legs, she has had decreased sensation.  He has had pain of her knees and ankles since the falls.  She reports that none of the falls have involved her head nor has she had any loss of consciousness.  Notes no other motor function weakness other than from her lower legs.  She does have some numbness of her groin but denies any incontinence episodes.  Has not tried anything for pain or symptoms.   Past Medical History:  Diagnosis Date  . Arthritis    RA  . Exertional angina ([place]) 07/16/2018   CT coronary 09/13/18: Coronary Ca score 62.1, 68% for age/sex matched control, No evidence of obstructive CAD (1-24% LAD, RCA)  . Family history of adverse reaction to anesthesia    son has difficulty waking   . Headache(784.0)    HX MIGRAINES   . Hypertension   . Melanoma ([place])   . Obesity   . Stroke [facility])    MINI STROKE     21 YRS AGO     Patient Active Problem List   Diagnosis Date Noted  . Hx of migraines 05/17/2020  . 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 Lumbar stenosis with neurogenic claudication 10/18/2019  . Exertional angina ([facility]) 07/16/2018  . Dyslipidemia 10/24/2016  . TIA (transient ischemic attack)  01/02/2015  . Rheumatoid arthritis ([place]) 01/02/2015  . HTN (hypertension) 04/10/2014  . Melanoma of trunk ([place]) 01/31/2013    Past Surgical History:  Procedure Laterality Date  . APPLICATION OF ROBOTIC ASSISTANCE FOR SPINAL PROCEDURE N/A 10/18/2019   Procedure: APPLICATION OF ROBOTIC ASSISTANCE FOR SPINAL PROCEDURE;  Surgeon: Kristeen Miss, MD;  Location: [facility];  Service: Neurosurgery;  Laterality: N/A;  . CESAREAN SECTION  11/26/1973  . CESAREAN SECTION  09/22/1975  . CESAREAN SECTION  04/30/1978  . HEMORRHOID SURGERY  06/1987  . MELANOMA EXCISION WITH SENTINEL LYMPH NODE BIOPSY Right 03/02/2013   Procedure: Wide MELANOMA EXCISION Right Abdominal wall WITH SENTINEL LYMPH NODE mapping Right Axilla;  Surgeon: Merrie Roof, MD;  Location: [facility];  Service: General;  Laterality: Right;  . NM [place]  2003   negative Bruce protocol exercise stress test with no evidence of perfusion abnormality, EF 66%  . TOTAL HIP ARTHROPLASTY Right 03/2011    OB History   No obstetric history on file.      Home Medications    Prior to Admission medications   Medication Sig Start Date End Date Taking? Authorizing Provider  amLODipine (  NORVASC) 10 MG tablet TAKE 1/2 TABLET(5 MG) BY MOUTH TWICE DAILY 11/05/20   Skeet Latch, MD  aspirin EC 81 MG tablet Take 1 tablet (81 mg total) by mouth daily. Swallow whole. 06/25/20   Frann Rider, NP  calcium-vitamin D (OSCAL WITH D) 500-200 MG-UNIT tablet Take 1 tablet by mouth daily.    [provider]  cetirizine (ZYRTEC) 10 MG tablet Take 10 mg by mouth daily.    [provider]  fenofibrate (TRICOR) 145 MG tablet TAKE 1 TABLET BY MOUTH DAILY 11/21/20   Skeet Latch, MD  gabapentin (NEURONTIN) 300 MG capsule Take 1 capsule (300 mg total) by mouth 3 (three) times daily. 10/18/20   Frann Rider, NP  irbesartan (AVAPRO) 300 MG tablet TAKE 1 TABLET BY MOUTH EVERY DAY 07/16/20   Skeet Latch, MD  leflunomide (ARAVA) 20  MG tablet Take 20 mg daily by mouth.    [provider]  OVER THE COUNTER MEDICATION Take 1 tablet by mouth in the morning, at noon, in the evening, and at bedtime. Medication: Kscurminin    [provider]  potassium chloride SA (KLOR-CON) 20 MEQ tablet Take 1 tablet (20 mEq total) by mouth 2 (two) times daily. SCHEDULE PHYSICAL 11/16/20   Jearld Fenton, NP  rosuvastatin (CRESTOR) 40 MG tablet TAKE 1 TABLET BY MOUTH EVERY DAY 07/26/20   Skeet Latch, MD    Family History Family History  Problem Relation Age of Onset  . Alzheimer's disease Mother   . Heart disease Father        MI, died at 9  . Alzheimer's disease Maternal Grandmother   . Cancer Maternal Grandfather        lung  . Heart attack Paternal Grandfather   . Tuberculosis Sister     Social History Social History   Tobacco Use  . Smoking status: Never Smoker  . Smokeless tobacco: Never Used  Substance Use Topics  . Alcohol use: No  . Drug use: No     Allergies   Sulfa antibiotics   Review of Systems Review of Systems  As stated above in HPI Physical Exam Triage Vital Signs ED Triage Vitals  Enc Vitals Group     BP 01/12/21 1240 (!) 132/48     Pulse Rate 01/12/21 1240 (!) 101     Resp 01/12/21 1240 20     Temp 01/12/21 1240 98.1 F (36.7 C)     Temp Source 01/12/21 1240 Oral     SpO2 01/12/21 1240 96 %     Weight --      Height --      Head Circumference --      Peak Flow --      Pain Score 01/12/21 1237 8     Pain Loc --      Pain Edu? --      Excl. in [place]? --    No data found.  Updated Vital Signs BP (!) 132/48 (BP Location: Right Arm)   Pulse (!) 101   Temp 98.1 F (36.7 C) (Oral)   Resp 20   SpO2 96%   Physical Exam Vitals and nursing note reviewed.  Constitutional:      Comments: pallor  HENT:     Head: Normocephalic and atraumatic.  Eyes:     Extraocular Movements: Extraocular movements intact.     Pupils: Pupils are equal, round, and reactive to light.   Cardiovascular:     Rate and Rhythm: Normal rate and regular rhythm.  Heart sounds: Normal heart sounds.  Pulmonary:     Effort: Pulmonary effort is normal.     Breath sounds: Normal breath sounds.  Musculoskeletal:     Cervical back: Neck supple.  Skin:    General: Skin is warm.     Capillary Refill: Capillary refill takes less than 2 seconds.     Findings: Bruising (lower legs throughout) present.  Neurological:     Mental Status: She is alert and oriented to person, place, and time.     Cranial Nerves: No cranial nerve deficit.     Sensory: Sensory deficit present.     Motor: Weakness present.     Coordination: Coordination abnormal.     Gait: Gait abnormal.     Deep Tendon Reflexes: Reflexes abnormal.     Comments: Patient is able to accurately sense sharp stimulus 50% of the time of the lower legs but is unable to differentiate between sharp and dull stimulus of bilateral legs      UC Treatments / Results  Labs (all labs ordered are listed, but only abnormal results are displayed) Labs Reviewed - No data to display  EKG   Radiology DG Ankle Complete Left  Result Date: 01/12/2021 CLINICAL DATA:  Pain after fall EXAM: LEFT ANKLE COMPLETE - 3+ VIEW COMPARISON:  None. FINDINGS: Bilateral soft tissue swelling. No acute ankle fracture or dislocation. Severe degenerative changes in the hindfoot. Absence of cuneiform bones seen laterally, likely from a previous surgery. No other acute abnormalities. IMPRESSION: 1. Bilateral soft tissue swelling. 2. No acute fractures identified. 3. Probable postoperative changes in the midfoot laterally. Recommend clinical correlation. 4. Degenerative changes. Electronically Signed   By: Dorise Bullion III M.D   On: 01/12/2021 13:58   DG Ankle Complete Right  Result Date: 01/12/2021 CLINICAL DATA:  Pain after fall EXAM: RIGHT ANKLE - COMPLETE 3+ VIEW COMPARISON:  None. FINDINGS: Severe degenerative changes in the hindfoot. Probable  postoperative changes in the region of the lateral cuneiform with probable resection. Bilateral soft tissue swelling. No acute fractures identified in the ankle. There appears to be a cortical break in the distal fifth metacarpal on the lateral view and probably the middle fifth metacarpal on the oblique view. No other acute fractures or abnormalities identified. IMPRESSION: 1. Suggested fractures of the fifth metacarpal distally and in its midportion, incompletely evaluated. Recommend dedicated imaging of the right foot. 2. Severe degenerative changes in the hindfoot. 3. No ankle fractures identified.  Bilateral soft tissue swelling. 4. Suspected postsurgical changes in the region of the lateral cuneiforms. Electronically Signed   By: Dorise Bullion III M.D   On: 01/12/2021 14:02   DG Knee AP/LAT W/[place] Left  Result Date: 01/12/2021 CLINICAL DATA:  Pain after fall EXAM: LEFT KNEE 3 VIEWS COMPARISON:  January 02, 2020 FINDINGS: A mild contour abnormality of the proximal left fibula is consistent with previous injury, better seen on the January 02, 2020 study. No acute fibular fracture noted. Mild tricompartmental degenerative changes. No joint effusion or acute fracture. IMPRESSION: Mild degenerative changes.  No acute fracture or effusion. Electronically Signed   By: Dorise Bullion III M.D   On: 01/12/2021 13:55   DG Knee AP/LAT W/Sun[place] Right  Result Date: 01/12/2021 CLINICAL DATA:  Pain after fall EXAM: RIGHT KNEE 3 VIEWS COMPARISON:  None. FINDINGS: Mild anterior soft tissue swelling. Mild tricompartmental degenerative changes. No fracture or effusion. IMPRESSION: Mild anterior soft tissue swelling.  No other acute abnormalities. Electronically Signed   By: Shanon Brow  Jimmye Norman III M.D   On: 01/12/2021 13:56    Procedures Procedures (including critical care time)  Medications Ordered in UC Medications - No data to display  Initial Impression / Assessment and Plan / UC Course  I have reviewed the  triage vital signs and the nursing notes.  Pertinent labs & imaging results that were available during my care of the patient were reviewed by me and considered in my medical decision making (see chart for details).     New.  As this occurred last Saturday I am not going to call code stroke but I am significantly concerned about her neurological function.  I discussed this with patient who agrees.  EMS transport to the hospital for further evaluation as I suspect she either has a potential spinal cord injury versus stroke.  We also discussed the likely fracture seen on her right foot which will need further evaluation and management in emergency room.  Final Clinical Impressions(s) / UC Diagnoses   Final diagnoses:  None   Discharge Instructions   None    ED Prescriptions    None     PDMP not reviewed this encounter.   Hughie Closs, PA-C 01/12/21 [address], PA-C 01/12/21 1429

## 2021-01-12 NOTE — ED Notes (Signed)
Pt in MRI at this time, will obtain urine when pt returns

## 2021-01-12 NOTE — ED Notes (Signed)
Patient is being discharged from the Urgent Care and sent to the Emergency Department via EMS . Per provider Nelwyn Salisbury, patient is in need of higher level of care due to multiple unstable injuries from fall. Patient is aware and verbalizes understanding of plan of care.   Vitals:   01/12/21 1240  BP: (!) 132/48  Pulse: (!) 101  Resp: 20  Temp: 98.1 F (36.7 C)  SpO2: 96%

## 2021-01-12 NOTE — ED Notes (Signed)
Patient transported to MRI 

## 2021-01-12 NOTE — ED Triage Notes (Signed)
Pt drover to UC alone but needed 2 person assist and a cane  to ambulate to triage room. Pt reports multiple falls over last week and now has pain to bil ankles and bil knees.

## 2021-01-12 NOTE — ED Notes (Signed)
Pt in ct 

## 2021-01-12 NOTE — ED Notes (Signed)
Per Radiology tech, pt to MRI @1900 .

## 2021-01-12 NOTE — ED Triage Notes (Signed)
Pt BIB EMS from UC. Pt reports fall x1 week ago and fractured right ankle. Pt has fell 3x since then. Pt has limited ROM to left ankle.

## 2021-01-12 NOTE — ED Provider Notes (Signed)
[facility] DEPT Provider Note   CSN: [id number] Arrival date & time: 01/12/21  1509     History Chief Complaint  Patient presents with  . Fall    Morgan Bell is a 70 y.o. female presenting for evaluation after a fall.   Pt states she fell 1 wk ago, and since then she has been having frequent falls. She tells me she fell due to losing her balance while unhooking her dogs leash. Since then she has fallen multiple times a day. She was seen at [facility], and sent to the ED. She states she is falling because her legs are giving out and buckling under her.  She has not hit her head or lost consciousness since the falls.  She is not on a blood thinner other than a baby aspirin a day due to a history of ICH in August 2022.  She does have a history of spinal surgery with Dr. Ellene Route, most recent surgery in January 2021.  She denies fevers, chills, chest pain, shortness of breath, cough, nausea, vomiting, abd pain, loss of bowel bladder control.  HPI     Past Medical History:  Diagnosis Date  . Arthritis    RA  . Exertional angina ([place]) 07/16/2018   CT coronary 09/13/18: Coronary Ca score 62.1, 68% for age/sex matched control, No evidence of obstructive CAD (1-24% LAD, RCA)  . Family history of adverse reaction to anesthesia    son has difficulty waking   . Headache(784.0)    HX MIGRAINES   . Hypertension   . Melanoma ([place])   . Obesity   . Stroke [facility])    MINI STROKE     21 YRS AGO     Patient Active Problem List   Diagnosis Date Noted  . Hx of migraines 05/17/2020  . 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 Lumbar stenosis with neurogenic claudication 10/18/2019  . Exertional angina ([facility]) 07/16/2018  . Dyslipidemia 10/24/2016  . TIA (transient ischemic attack) 01/02/2015  . Rheumatoid arthritis ([place]) 01/02/2015  . HTN (hypertension) 04/10/2014  . Melanoma of trunk ([place]) 01/31/2013    Past Surgical History:   Procedure Laterality Date  . APPLICATION OF ROBOTIC ASSISTANCE FOR SPINAL PROCEDURE N/A 10/18/2019   Procedure: APPLICATION OF ROBOTIC ASSISTANCE FOR SPINAL PROCEDURE;  Surgeon: Kristeen Miss, MD;  Location: [facility];  Service: Neurosurgery;  Laterality: N/A;  . CESAREAN SECTION  11/26/1973  . CESAREAN SECTION  09/22/1975  . CESAREAN SECTION  04/30/1978  . HEMORRHOID SURGERY  06/1987  . MELANOMA EXCISION WITH SENTINEL LYMPH NODE BIOPSY Right 03/02/2013   Procedure: Wide MELANOMA EXCISION Right Abdominal wall WITH SENTINEL LYMPH NODE mapping Right Axilla;  Surgeon: Merrie Roof, MD;  Location: [facility];  Service: General;  Laterality: Right;  . NM [place]  2003   negative Bruce protocol exercise stress test with no evidence of perfusion abnormality, EF 66%  . TOTAL HIP ARTHROPLASTY Right 03/2011     OB History   No obstetric history on file.     Family History  Problem Relation Age of Onset  . Alzheimer's disease Mother   . Heart disease Father        MI, died at 21  . Alzheimer's disease Maternal Grandmother   . Cancer Maternal Grandfather        lung  . Heart attack Paternal Grandfather   . Tuberculosis Sister     Social History  Tobacco Use  . Smoking status: Never Smoker  . Smokeless tobacco: Never Used  Substance Use Topics  . Alcohol use: No  . Drug use: No    Home Medications Prior to Admission medications   Medication Sig Start Date End Date Taking? Authorizing Provider  amLODipine (NORVASC) 10 MG tablet TAKE 1/2 TABLET(5 MG) BY MOUTH TWICE DAILY 11/05/20   Skeet Latch, MD  aspirin EC 81 MG tablet Take 1 tablet (81 mg total) by mouth daily. Swallow whole. 06/25/20   Frann Rider, NP  calcium-vitamin D (OSCAL WITH D) 500-200 MG-UNIT tablet Take 1 tablet by mouth daily.    [provider]  cetirizine (ZYRTEC) 10 MG tablet Take 10 mg by mouth daily.    [provider]  fenofibrate (TRICOR) 145 MG tablet TAKE 1 TABLET BY MOUTH  DAILY 11/21/20   Skeet Latch, MD  gabapentin (NEURONTIN) 300 MG capsule Take 1 capsule (300 mg total) by mouth 3 (three) times daily. 10/18/20   Frann Rider, NP  irbesartan (AVAPRO) 300 MG tablet TAKE 1 TABLET BY MOUTH EVERY DAY 07/16/20   Skeet Latch, MD  leflunomide (ARAVA) 20 MG tablet Take 20 mg daily by mouth.    [provider]  OVER THE COUNTER MEDICATION Take 1 tablet by mouth in the morning, at noon, in the evening, and at bedtime. Medication: Kscurminin    [provider]  potassium chloride SA (KLOR-CON) 20 MEQ tablet Take 1 tablet (20 mEq total) by mouth 2 (two) times daily. SCHEDULE PHYSICAL 11/16/20   Jearld Fenton, NP  rosuvastatin (CRESTOR) 40 MG tablet TAKE 1 TABLET BY MOUTH EVERY DAY 07/26/20   Skeet Latch, MD    Allergies    Sulfa antibiotics  Review of Systems   Review of Systems  Neurological: Positive for weakness.  All other systems reviewed and are negative.   Physical Exam Updated Vital Signs BP (!) 138/96   Pulse 86   Temp 98.1 F (36.7 C) (Oral)   Resp 18   Ht 5' (1.524 m)   Wt 60.2 kg   SpO2 94%   BMI 25.92 kg/m   Physical Exam Vitals and nursing note reviewed. Exam conducted with a chaperone present.  Constitutional:      General: She is not in acute distress.    Appearance: She is well-developed.     Comments: nontoxic  HENT:     Head: Normocephalic and atraumatic.  Eyes:     Extraocular Movements: Extraocular movements intact.     Conjunctiva/sclera: Conjunctivae normal.     Pupils: Pupils are equal, round, and reactive to light.  Cardiovascular:     Rate and Rhythm: Normal rate and regular rhythm.     Pulses: Normal pulses.  Pulmonary:     Effort: Pulmonary effort is normal. No respiratory distress.     Breath sounds: Normal breath sounds. No wheezing.  Abdominal:     General: There is no distension.     Palpations: Abdomen is soft. There is no mass.     Tenderness: There is no abdominal tenderness.  There is no guarding or rebound.  Genitourinary:    Rectum: Normal anal tone.     Comments: Normal rectal tone and sensation Musculoskeletal:        General: Swelling and tenderness present.     Cervical back: Normal range of motion and neck supple.     Comments: Bruising and swelling noted of the right foot, mostly over the great toe and the lateral right  foot.  Pedal pulses 2+. Bruises noted bilateral knees and shins. No focal tenderness palpation over back or midline spine.  Surgical scars noted. Strength in lower extremities equal bilaterally. When attempted to walk, patient able to take a few ataxic steps before almost falling to the ground due to weakness.  Assisted back to the chair.  Skin:    General: Skin is warm and dry.     Capillary Refill: Capillary refill takes less than 2 seconds.  Neurological:     Mental Status: She is alert and oriented to person, place, and time.     ED Results / Procedures / Treatments   Labs (all labs ordered are listed, but only abnormal results are displayed) Labs Reviewed  CBC WITH DIFFERENTIAL/PLATELET - Abnormal; Notable for the following components:      Result Value   RDW 17.8 (*)    Platelets 139 (*)    Lymphs Abs 0.6 (*)    All other components within normal limits  COMPREHENSIVE METABOLIC PANEL - Abnormal; Notable for the following components:   CO2 21 (*)    BUN 26 (*)    Creatinine, Ser 1.26 (*)    AST 61 (*)    GFR, Estimated 45 (*)    All other components within normal limits  URINALYSIS, ROUTINE W REFLEX MICROSCOPIC - Abnormal; Notable for the following components:   Ketones, ur 5 (*)    Protein, ur 30 (*)    Leukocytes,Ua TRACE (*)    All other components within normal limits  RESP PANEL BY RT-PCR (FLU A&B, COVID) [id number]    EKG None  Radiology DG Chest 2 View  Result Date: 01/12/2021 CLINICAL DATA:  Weakness.  Multiple falls. EXAM: CHEST - 2 VIEW COMPARISON:  Mar 08, 2020 FINDINGS: The heart, hila, and mediastinum  are unremarkable. No pneumothorax. No nodules or masses. Stable cardiomediastinal silhouette. Pedicle rods and screws are seen in the lumbar spine. Multilevel kyphoplasties identified. Mild anterior wedging of T10 is stable since March 28, 2020. No acute fractures are seen. IMPRESSION: No acute abnormalities identified. Electronically Signed   By: Dorise Bullion III M.D   On: 01/12/2021 16:30   DG Ankle Complete Left  Result Date: 01/12/2021 CLINICAL DATA:  Pain after fall EXAM: LEFT ANKLE COMPLETE - 3+ VIEW COMPARISON:  None. FINDINGS: Bilateral soft tissue swelling. No acute ankle fracture or dislocation. Severe degenerative changes in the hindfoot. Absence of cuneiform bones seen laterally, likely from a previous surgery. No other acute abnormalities. IMPRESSION: 1. Bilateral soft tissue swelling. 2. No acute fractures identified. 3. Probable postoperative changes in the midfoot laterally. Recommend clinical correlation. 4. Degenerative changes. Electronically Signed   By: Dorise Bullion III M.D   On: 01/12/2021 13:58   DG Ankle Complete Right  Result Date: 01/12/2021 CLINICAL DATA:  Pain after fall EXAM: RIGHT ANKLE - COMPLETE 3+ VIEW COMPARISON:  None. FINDINGS: Severe degenerative changes in the hindfoot. Probable postoperative changes in the region of the lateral cuneiform with probable resection. Bilateral soft tissue swelling. No acute fractures identified in the ankle. There appears to be a cortical break in the distal fifth metacarpal on the lateral view and probably the middle fifth metacarpal on the oblique view. No other acute fractures or abnormalities identified. IMPRESSION: 1. Suggested fractures of the fifth metacarpal distally and in its midportion, incompletely evaluated. Recommend dedicated imaging of the right foot. 2. Severe degenerative changes in the hindfoot. 3. No ankle fractures identified.  Bilateral soft tissue swelling. 4. Suspected postsurgical  changes in the region of the  lateral cuneiforms. Electronically Signed   By: Dorise Bullion III M.D   On: 01/12/2021 14:02   CT Head Wo Contrast  Result Date: 01/12/2021 CLINICAL DATA:  Neuro deficit, acute stroke suspected. Multiple falls. EXAM: CT HEAD WITHOUT CONTRAST TECHNIQUE: Contiguous axial images were obtained from the base of the skull through the vertex without intravenous contrast. COMPARISON:  06/22/2020. FINDINGS: Brain: No evidence of acute large vascular territory infarction, hemorrhage, hydrocephalus, extra-axial collection or mass lesion/mass effect. Patchy areas of white matter hypoattenuation appears similar to prior and most likely represent the sequela of chronic microvascular ischemic disease. Vascular: No hyperdense vessel identified. Calcific atherosclerosis. Skull: No acute fracture Sinuses/Orbits: Retention cyst in bilateral maxillary sinuses with areas of mucosal thickening in the maxillary sinuses and ethmoid air cells. Other: No mastoid effusions. IMPRESSION: 1. No evidence of acute intracranial abnormality. 2. Similar presumed chronic microvascular ischemic disease. 3. Paranasal sinus disease, as detailed above. Electronically Signed   By: Margaretha Sheffield MD   On: 01/12/2021 16:27   CT Lumbar Spine Wo Contrast  Result Date: 01/12/2021 CLINICAL DATA:  Low back pain. Progressive neurologic deficit. Multiple falls over the last week. EXAM: CT LUMBAR SPINE WITHOUT CONTRAST TECHNIQUE: Multidetector CT imaging of the lumbar spine was performed without intravenous contrast administration. Multiplanar CT image reconstructions were also generated. COMPARISON:  Lumbar MRI 08/15/2019, thoracic MRI 02/28/2020, radiographs 12/09/2019 and lumbar myelogram CT 09/19/2019. FINDINGS: Segmentation: There are 5 lumbar type vertebral bodies. Alignment: Mild convex right scoliosis and stable grade 1 degenerative anterolisthesis at L5-S1. Vertebrae: Patient is status post posterolateral fusion from T11 through L4. The superior  extent of the hardware is not included on this examination which extends to the level of the T11-12 disc. The visualized hardware is intact without loosening. Patient has undergone multilevel spinal augmentation, visualized from T12 through L4 on this examination. The chronic L2 burst fracture and associated osseous retropulsion are grossly stable. Methylmethacrylate within the L2 vertebral body extends into the L1-2 disc, unchanged. There is a new superior endplate compression fracture at L5, resulting in approximately 25% loss of vertebral body height and fragmentation of the superior endplate asymmetric to the right. No other acute fractures are seen. The sacroiliac joints are degenerated, but intact. Paraspinal and other soft tissues: No acute paraspinal abnormalities. There is fatty atrophy of the erector spinae musculature. Aortoiliac atherosclerosis and bilateral renal cysts are noted. Disc levels: L1-2: Stable disc bulging, endplate osteophytes and osseous retropulsion at L2. The spinal canal is decompressed by previous laminectomies. Mild foraminal narrowing bilaterally. Stable asymmetric narrowing of the right lateral recess. L2-3: Stable disc bulging and endplate osteophytes. The spinal canal is decompressed by the previous laminectomies. No significant foraminal narrowing. L3-4: Disc bulging and endplate osteophytes asymmetric to the left. Stable mild asymmetric narrowing of the left lateral recess and left foramen. L4-5: Interval superior endplate compression fracture at L5 with progressive annular disc bulging and endplate osteophytes. Moderate facet and ligamentous hypertrophy. Resulting mild spinal stenosis with mild to moderate lateral recess and foraminal narrowing bilaterally. L5-S1: Stable chronic degenerative disc disease with loss of disc height, annular disc bulging and endplate osteophytes. Moderate facet hypertrophy asymmetric the left. Mild chronic foraminal narrowing bilaterally.  IMPRESSION: 1. Compared with the most recent radiographs of 5 weeks ago, there is a new superior endplate compression fracture at L5 with approximately 25% loss of vertebral body height and superior endplate fragmentation. 2. No other acute osseous findings status post multilevel spinal augmentation.  Grossly stable chronic burst fracture at L2. 3. Intact hardware and stable alignment status post T11 through L4 posterior fusion. The superior extent of the fusion is not imaged on this examination. 4. Multilevel spondylosis as described. The spinal canal remains adequately decompressed by the previous laminectomies at L1-2 and L2-3. There is mildly progressive spinal stenosis with lateral recess and foraminal narrowing bilaterally at L4-5. Electronically Signed   By: Richardean Sale M.D.   On: 01/12/2021 16:45   MR BRAIN W WO CONTRAST  Result Date: 01/12/2021 CLINICAL DATA:  Initial evaluation for neuro deficit, stroke suspected. EXAM: MRI HEAD WITHOUT AND WITH CONTRAST TECHNIQUE: Multiplanar, multiecho pulse sequences of the brain and surrounding structures were obtained without and with intravenous contrast. CONTRAST:  16mL GADAVIST GADOBUTROL 1 MMOL/ML IV SOLN COMPARISON:  Prior CT from earlier the same day. FINDINGS: Brain: Mild diffuse prominence of the CSF containing spaces compatible generalized cerebral atrophy. Patchy T2/FLAIR hyperintensity within the periventricular deep white matter both cerebral hemispheres, most consistent with chronic small vessel ischemic disease, mild in nature. No abnormal foci of restricted diffusion to suggest acute or subacute ischemia. Gray-white matter differentiation maintained. No encephalomalacia to suggest chronic cortical infarction. No acute intracranial hemorrhage. Focus of chronic hemosiderin staining present at the right cerebellum related to previously identified right cerebellar hemorrhage. Two additional punctate chronic micro hemorrhages noted at the right thalamus  and right basal ganglia. No other visible evidence for chronic intracranial hemorrhage. No mass lesion, midline shift or mass effect. No hydrocephalus or extra-axial fluid collection. Pituitary gland suprasellar region within normal limits. Midline structures intact. No abnormal enhancement. Vascular: Major intracranial vascular flow voids are maintained at the skull base. Skull and upper cervical spine: Craniocervical junction within normal limits. Bone marrow signal intensity normal. No focal marrow replacing lesion. No scalp soft tissue abnormality. Sinuses/Orbits: Globes orbital soft tissues demonstrate no acute finding. Scattered mucosal thickening noted within the ethmoidal air cells and maxillary sinuses. Superimposed right maxillary sinus retention cyst. Trace right mastoid effusion noted, of doubtful significance. Inner ear structures grossly normal. Other: None. IMPRESSION: 1. No acute intracranial abnormality. 2. 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 3. Underlying mild chronic microvascular ischemic disease. Electronically Signed   By: Jeannine Boga M.D.   On: 01/12/2021 22:25   MR THORACIC SPINE W WO CONTRAST  Result Date: 01/12/2021 CLINICAL DATA:  Initial evaluation for cord compression. EXAM: MRI THORACIC AND LUMBAR SPINE WITHOUT AND WITH CONTRAST TECHNIQUE: Multiplanar and multiecho pulse sequences of the thoracic and lumbar spine were obtained without and with intravenous contrast. CONTRAST:  67mL GADAVIST GADOBUTROL 1 MMOL/ML IV SOLN COMPARISON:  Prior CT from earlier the same day. FINDINGS: MRI THORACIC SPINE FINDINGS Alignment: Mild scoliosis with exaggeration of the normal lower thoracic kyphosis. Trace anterolisthesis of T3 on T4. Mild retrolisthesis of up to 3 mm at T11-12 and T12-L1. Vertebrae: Susceptibility artifact related to prior posterior fusion at T11 through the upper lumbar spine. Compression  deformities with sequelae of prior vertebral augmentation present at T8 and T9. Residual marrow edema and enhancement with extension into the posterior elements. Additional mild compression deformity extends through the inferior endplate of T7, chronic in appearance without significant residual marrow edema. Otherwise, vertebral body height maintained, with no other acute fracture identified. Underlying bone marrow signal intensity within normal limits. No discrete osseous lesions. No other abnormal marrow edema. Cord: Patchy signal abnormality seen within the thoracic spinal cord at the level of T8-9 related  to compressive myelopathy (series 35, image 12). Signal intensity within the thoracic spinal cord otherwise within normal limits. Paraspinal and other soft tissues: Persistent paraspinous edema adjacent to the T8 and T9 fractures. Paraspinous soft tissues demonstrate no other acute finding. Probable large hiatal hernia partially visualized. Multiple benign appearing cyst noted about the visualized kidneys, with a large 8.6 cm cyst extending from the right kidney. Disc levels: T1-2: Central disc protrusion indents the ventral thecal sac. Bilateral facet hypertrophy. Mild cord flattening without significant spinal stenosis. Foramina remain patent. T2-3: Disc bulge with facet hypertrophy. No significant spinal stenosis. Mild foraminal narrowing. T3-4: Anterolisthesis. Disc bulge with superimposed small right central disc protrusion. Posterior element hypertrophy. No significant spinal stenosis. Mild bilateral foraminal narrowing. T4-5: Minimal disc bulge with posterior element hypertrophy. No stenosis. T5-6: Minimal disc bulge with posterior element hypertrophy. No significant spinal stenosis. Mild left foraminal narrowing. T6-7: Mild disc bulge with posterior element hypertrophy. No stenosis. T7-8: Mild disc bulge with prominent posterior facet hypertrophy. No significant spinal stenosis. Moderate right foraminal  narrowing. T8-9: T8 compression fracture with sequelae of prior vertebral augmentation. T1/T2 hypointense material posterior to the T8 vertebral body measures 1.5 x 0.7 x 1.1 cm, favored to reflect extruded disc material. Compression and effacement of the ventral thecal sac. Superimposed advanced bilateral facet hypertrophy. Resultant severe spinal stenosis with compression of the thoracic cord at this level. Thecal sac measures 3 mm in AP diameter at its most narrow point. Associated cord signal changes concerning for edema and/or myelomalacia. T9-10: Tiny central disc protrusion minimally indents the ventral thecal sac. No significant spinal stenosis. Foramina remain patent. T10-11: Mild disc bulge. No significant spinal stenosis. Foramina remain patent. T11-12:  Prior posterior fusion.  No residual stenosis. T12-L1: Prior posterior fusion. Mild right eccentric disc bulge. No significant stenosis. MRI LUMBAR SPINE FINDINGS Segmentation: Standard. Lowest well-formed disc space labeled the L5-S1 level. Alignment: Mild dextroscoliosis with stable mild grade 1 anterolisthesis of L5 on S1. Vertebrae: Prior posterior fusion at T11 through L4. Sequelae of prior vertebral augmentation at T11 through L4. Severe chronic L2 burst fracture is stable and chronic in appearance. Additional compression deformities involving the inferior endplate of L1 also chronic in appearance. Mild compression deformity involving the superior endplate of L5 with up to 25% height loss and trace 4 mm bony retropulsion is late subacute in appearance with relatively mild residual marrow edema and enhancement. Signal change at the adjacent inferior endplate of L4 favored to be discogenic in nature. No other acute or subacute compression fracture within the lumbar spine. Underlying bone marrow signal intensity within normal limits. No worrisome osseous lesions. Conus medullaris: Extends to the L1-2 level and appears normal. Paraspinal and other soft  tissues: Disc levels: L1-2: Disc bulge with up to 7 mm bony retropulsion related to the chronic L2 compression fracture. Prior posterior decompression with fusion. Residual mild narrowing of the right lateral recess. Spinal canal self remains widely patent. Foramina appear patent as well. L2-3: Disc bulge with reactive endplate spurring. Prior posterior decompression with fusion. Residual facet hypertrophy. No residual spinal stenosis. Superimposed right foraminal disc protrusion closely approximates the exiting right L2 nerve root with associated mild right foraminal stenosis (series 19, image 6). Left neural foramina remains patent. No residual spinal stenosis. L3-4: Mild disc bulge with reactive endplate change. Prior posterior fusion. Moderate facet hypertrophy. No significant residual spinal stenosis. Mild left L3 foraminal narrowing. Right neural foramen remains patent. L4-5: Disc bulge with reactive endplate change. Advanced bilateral facet arthrosis.  Resultant moderate canal with bilateral lateral recess stenosis, worse on the right. Moderate right worse than left L4 foraminal narrowing. L5-S1: Anterolisthesis. Mild disc bulge with reactive endplate spurring. Moderate facet hypertrophy. No significant spinal stenosis. Moderate right worse than left L5 foraminal narrowing. IMPRESSION: MRI THORACIC SPINE IMPRESSION: 1. Multifactorial degenerative changes at T8-9 with resultant severe spinal stenosis and cord impingement. 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 Soft tissue density within the ventral epidural space at this level favored to reflect extruded disc material, although correlation with CT could be performed to ensure no extruded cement is contributing could be performed as clinically desired. 2. Compression fractures at T8 and T9 with sequelae of prior vertebral augmentation, with persistent marrow edema and enhancement. 3. Large hiatal hernia. MRI LUMBAR SPINE  IMPRESSION: 1. Late subacute compression fracture at the superior endplate of L5 with mild 25% height loss and 4 mm bony retropulsion. 2. No other acute abnormality within the lumbar spine. No evidence for cord compression. 3. Sequelae of prior posterior decompression at fusion at T11 through L4 with underlying multilevel degenerative spondylosis as above. Resultant moderate spinal stenosis at L4-5, with moderate bilateral L4 and L5 foraminal narrowing. Critical Value/emergent results were called by telephone at the time of interpretation on 01/12/2021 at 11:07 pm to provider Dr. Tamera Punt, Who verbally acknowledged these results. Electronically Signed   By: Jeannine Boga M.D.   On: 01/12/2021 23:14   MR Lumbar Spine W Wo Contrast  Result Date: 01/12/2021 CLINICAL DATA:  Initial evaluation for cord compression. EXAM: MRI THORACIC AND LUMBAR SPINE WITHOUT AND WITH CONTRAST TECHNIQUE: Multiplanar and multiecho pulse sequences of the thoracic and lumbar spine were obtained without and with intravenous contrast. CONTRAST:  43mL GADAVIST GADOBUTROL 1 MMOL/ML IV SOLN COMPARISON:  Prior CT from earlier the same day. FINDINGS: MRI THORACIC SPINE FINDINGS Alignment: Mild scoliosis with exaggeration of the normal lower thoracic kyphosis. Trace anterolisthesis of T3 on T4. Mild retrolisthesis of up to 3 mm at T11-12 and T12-L1. Vertebrae: Susceptibility artifact related to prior posterior fusion at T11 through the upper lumbar spine. Compression deformities with sequelae of prior vertebral augmentation present at T8 and T9. Residual marrow edema and enhancement with extension into the posterior elements. Additional mild compression deformity extends through the inferior endplate of T7, chronic in appearance without significant residual marrow edema. Otherwise, vertebral body height maintained, with no other acute fracture identified. Underlying bone marrow signal intensity within normal limits. No discrete osseous lesions.  No other abnormal marrow edema. 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 Signal intensity within the thoracic spinal cord otherwise within normal limits. Paraspinal and other soft tissues: Persistent paraspinous edema adjacent to the T8 and T9 fractures. Paraspinous soft tissues demonstrate no other acute finding. Probable large hiatal hernia partially visualized. Multiple benign appearing cyst noted about the visualized kidneys, with a large 8.6 cm cyst extending from the right kidney. Disc levels: T1-2: Central disc protrusion indents the ventral thecal sac. Bilateral facet hypertrophy. Mild cord flattening without significant spinal stenosis. Foramina remain patent. T2-3: Disc bulge with facet hypertrophy. No significant spinal stenosis. Mild foraminal narrowing. T3-4: Anterolisthesis. Disc bulge with superimposed small right central disc protrusion. Posterior element hypertrophy. No significant spinal stenosis. Mild bilateral foraminal narrowing. T4-5: Minimal disc bulge with posterior element hypertrophy. No stenosis. T5-6: Minimal disc bulge with posterior element hypertrophy. No significant spinal stenosis. Mild left foraminal narrowing. T6-7:  Mild disc bulge with posterior element hypertrophy. No stenosis. T7-8: Mild disc bulge with prominent posterior facet hypertrophy. No significant spinal stenosis. Moderate right foraminal narrowing. T8-9: T8 compression fracture with sequelae of prior vertebral augmentation. T1/T2 hypointense material posterior to the T8 vertebral body measures 1.5 x 0.7 x 1.1 cm, favored to reflect extruded disc material. Compression and effacement of the ventral thecal sac. Superimposed advanced bilateral facet hypertrophy. Resultant severe spinal stenosis with compression of the thoracic cord at this level. Thecal sac measures 3 mm in AP diameter at its most narrow point.  Associated cord signal changes concerning for edema and/or myelomalacia. T9-10: Tiny central disc protrusion minimally indents the ventral thecal sac. No significant spinal stenosis. Foramina remain patent. T10-11: Mild disc bulge. No significant spinal stenosis. Foramina remain patent. T11-12:  Prior posterior fusion.  No residual stenosis. T12-L1: Prior posterior fusion. Mild right eccentric disc bulge. No significant stenosis. MRI LUMBAR SPINE FINDINGS Segmentation: Standard. Lowest well-formed disc space labeled the L5-S1 level. Alignment: Mild dextroscoliosis with stable mild grade 1 anterolisthesis of L5 on S1. Vertebrae: Prior posterior fusion at T11 through L4. Sequelae of prior vertebral augmentation at T11 through L4. Severe chronic L2 burst fracture is stable and chronic in appearance. Additional compression deformities involving the inferior endplate of L1 also chronic in appearance. Mild compression deformity involving the superior endplate of L5 with up to 25% height loss and trace 4 mm bony retropulsion is late subacute in appearance with relatively mild residual marrow edema and enhancement. Signal change at the adjacent inferior endplate of L4 favored to be discogenic in nature. No other acute or subacute compression fracture within the lumbar spine. Underlying bone marrow signal intensity within normal limits. No worrisome osseous lesions. Conus medullaris: Extends to the L1-2 level and appears normal. Paraspinal and other soft tissues: Disc levels: L1-2: Disc bulge with up to 7 mm bony retropulsion related to the chronic L2 compression fracture. Prior posterior decompression with fusion. Residual mild narrowing of the right lateral recess. Spinal canal self remains widely patent. Foramina appear patent as well. L2-3: Disc bulge with reactive endplate spurring. Prior posterior decompression with fusion. Residual facet hypertrophy. No residual spinal stenosis. Superimposed right foraminal disc  protrusion closely approximates the exiting right L2 nerve root with associated mild right foraminal stenosis (series 19, image 6). Left neural foramina remains patent. No residual spinal stenosis. L3-4: Mild disc bulge with reactive endplate change. Prior posterior fusion. Moderate facet hypertrophy. No significant residual spinal stenosis. Mild left L3 foraminal narrowing. Right neural foramen remains patent. L4-5: Disc bulge with reactive endplate change. Advanced bilateral facet arthrosis. Resultant moderate canal with bilateral lateral recess stenosis, worse on the right. Moderate right worse than left L4 foraminal narrowing. L5-S1: Anterolisthesis. Mild disc bulge with reactive endplate spurring. Moderate facet hypertrophy. No significant spinal stenosis. Moderate right worse than left L5 foraminal narrowing. IMPRESSION: MRI THORACIC SPINE IMPRESSION: 1. Multifactorial degenerative changes at T8-9 with resultant severe spinal stenosis and cord impingement. 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 Soft tissue density within the ventral epidural space at this level favored to reflect extruded disc material, although correlation with CT could be performed to ensure no extruded cement is contributing could be performed as clinically desired. 2. Compression fractures at T8 and T9 with sequelae of prior vertebral augmentation, with persistent marrow edema and enhancement. 3. Large hiatal hernia. MRI LUMBAR SPINE IMPRESSION: 1. Late subacute compression fracture at the superior endplate of L5 with mild 25% height loss and 4  mm bony retropulsion. 2. No other acute abnormality within the lumbar spine. No evidence for cord compression. 3. Sequelae of prior posterior decompression at fusion at T11 through L4 with underlying multilevel degenerative spondylosis as above. Resultant moderate spinal stenosis at L4-5, with moderate bilateral L4 and L5 foraminal narrowing. Critical Value/emergent  results were called by telephone at the time of interpretation on 01/12/2021 at 11:07 pm to provider Dr. Tamera Punt, Who verbally acknowledged these results. Electronically Signed   By: Jeannine Boga M.D.   On: 01/12/2021 23:14   DG Foot Complete Right  Result Date: 01/12/2021 CLINICAL DATA:  Multiple falls over the last week with pain in the bilateral ankles and knees. Patient broke her right foot last week by report. EXAM: RIGHT FOOT COMPLETE - 3+ VIEW COMPARISON:  None. FINDINGS: There is a fracture through the distal fifth metatarsal. A subtle fracture through the proximal aspect of the proximal fifth phalanx is not excluded. No other fractures are identified. IMPRESSION: Definite fracture through the distal fifth metatarsal. Possible subtle fracture through the proximal fifth phalanx. Electronically Signed   By: Dorise Bullion III M.D   On: 01/12/2021 16:28   DG Knee AP/LAT W/Sunrise Left  Result Date: 01/12/2021 CLINICAL DATA:  Pain after fall EXAM: LEFT KNEE 3 VIEWS COMPARISON:  January 02, 2020 FINDINGS: A mild contour abnormality of the proximal left fibula is consistent with previous injury, better seen on the January 02, 2020 study. No acute fibular fracture noted. Mild tricompartmental degenerative changes. No joint effusion or acute fracture. IMPRESSION: Mild degenerative changes.  No acute fracture or effusion. Electronically Signed   By: Dorise Bullion III M.D   On: 01/12/2021 13:55   DG Knee AP/LAT W/Sunrise Right  Result Date: 01/12/2021 CLINICAL DATA:  Pain after fall EXAM: RIGHT KNEE 3 VIEWS COMPARISON:  None. FINDINGS: Mild anterior soft tissue swelling. Mild tricompartmental degenerative changes. No fracture or effusion. IMPRESSION: Mild anterior soft tissue swelling.  No other acute abnormalities. Electronically Signed   By: Dorise Bullion III M.D   On: 01/12/2021 13:56    Procedures Procedures   Medications Ordered in ED Medications  gadobutrol (GADAVIST) 1 MMOL/ML injection 6  mL (6 mLs Intravenous Contrast Given 01/12/21 2042)    ED Course  I have reviewed the triage vital signs and the nursing notes.  Pertinent labs & imaging results that were available during my care of the patient were reviewed by me and considered in my medical decision making (see chart for details).    MDM Rules/Calculators/A&P                          Pt presenting for evaluation of frequent falls since a fall last week. On exam, pt with decreased sensation of proximal inner thighs. No decreased sensation elsewhere. No hyperreflexia or abnormal rectal tone. However pt not able to ambulate due to weakness of the legs. Concern for fx vs compression. Consider tumor due to pt's age. Consider repeat ich, although less likely without HA.   CT head negative for acute findings. CT lumbar spine shows L5 fx. Will order MRI brain, t and L spine. Xray of the foot shows metacarpal fx, will place in splint. Labs overall reassuring, and likely not the cause of pts sxs.   MRI concerning for T8 fx with subsequent cord compression. Will consult with neurosurgery.   Discussed with Birdena Jubilee NP from neurosurgery who recommends admission to [facility]. request hospitalist consult.   Discussed  with Dr. Posey Pronto who feels neurosurgery should be primary, and [facility] can consult as needed.   Discussed with K Meyran, pt to be admitted by neurosurgery.   Final Clinical Impression(s) / ED Diagnoses Final diagnoses:  Compression fracture of T8 vertebra, initial encounter [facility])  Cord compression ([address])  Fall, initial encounter  Closed nondisplaced fracture of fifth metatarsal bone of right foot, initial encounter    Rx / DC Orders ED Discharge Orders    None       Franchot Heidelberg, PA-C 01/13/21 0040    Malvin Johns, MD 01/13/21 1507

## 2021-01-13 ENCOUNTER — Encounter (HOSPITAL_COMMUNITY): Payer: Self-pay | Admitting: Student

## 2021-01-13 DIAGNOSIS — S92354A Nondisplaced fracture of fifth metatarsal bone, right foot, initial encounter for closed fracture: Secondary | ICD-10-CM | POA: Diagnosis present

## 2021-01-13 DIAGNOSIS — R32 Unspecified urinary incontinence: Secondary | ICD-10-CM | POA: Diagnosis present

## 2021-01-13 DIAGNOSIS — R296 Repeated falls: Secondary | ICD-10-CM | POA: Diagnosis present

## 2021-01-13 DIAGNOSIS — W19XXXA Unspecified fall, initial encounter: Secondary | ICD-10-CM | POA: Diagnosis present

## 2021-01-13 DIAGNOSIS — Z8249 Family history of ischemic heart disease and other diseases of the circulatory system: Secondary | ICD-10-CM | POA: Diagnosis not present

## 2021-01-13 DIAGNOSIS — Z85820 Personal history of malignant melanoma of skin: Secondary | ICD-10-CM | POA: Diagnosis not present

## 2021-01-13 DIAGNOSIS — M810 Age-related osteoporosis without current pathological fracture: Secondary | ICD-10-CM | POA: Diagnosis present

## 2021-01-13 DIAGNOSIS — Z7982 Long term (current) use of aspirin: Secondary | ICD-10-CM | POA: Diagnosis not present

## 2021-01-13 DIAGNOSIS — Z981 Arthrodesis status: Secondary | ICD-10-CM | POA: Diagnosis not present

## 2021-01-13 DIAGNOSIS — G959 Disease of spinal cord, unspecified: Secondary | ICD-10-CM | POA: Diagnosis present

## 2021-01-13 DIAGNOSIS — I10 Essential (primary) hypertension: Secondary | ICD-10-CM | POA: Diagnosis present

## 2021-01-13 DIAGNOSIS — E785 Hyperlipidemia, unspecified: Secondary | ICD-10-CM | POA: Diagnosis present

## 2021-01-13 DIAGNOSIS — M5104 Intervertebral disc disorders with myelopathy, thoracic region: Secondary | ICD-10-CM | POA: Diagnosis present

## 2021-01-13 DIAGNOSIS — Z6825 Body mass index (BMI) 25.0-25.9, adult: Secondary | ICD-10-CM | POA: Diagnosis not present

## 2021-01-13 DIAGNOSIS — E669 Obesity, unspecified: Secondary | ICD-10-CM | POA: Diagnosis present

## 2021-01-13 DIAGNOSIS — Z20822 Contact with and (suspected) exposure to covid-19: Secondary | ICD-10-CM | POA: Diagnosis present

## 2021-01-13 DIAGNOSIS — Z79899 Other long term (current) drug therapy: Secondary | ICD-10-CM | POA: Diagnosis not present

## 2021-01-13 DIAGNOSIS — Z8673 Personal history of transient ischemic attack (TIA), and cerebral infarction without residual deficits: Secondary | ICD-10-CM | POA: Diagnosis not present

## 2021-01-13 DIAGNOSIS — Z96641 Presence of right artificial hip joint: Secondary | ICD-10-CM | POA: Diagnosis present

## 2021-01-13 DIAGNOSIS — M4854XA Collapsed vertebra, not elsewhere classified, thoracic region, initial encounter for fracture: Secondary | ICD-10-CM | POA: Diagnosis present

## 2021-01-13 DIAGNOSIS — Z882 Allergy status to sulfonamides status: Secondary | ICD-10-CM | POA: Diagnosis not present

## 2021-01-13 DIAGNOSIS — M069 Rheumatoid arthritis, unspecified: Secondary | ICD-10-CM | POA: Diagnosis present

## 2021-01-13 MED ORDER — SENNA 8.6 MG PO TABS
1.0000 | ORAL_TABLET | Freq: Two times a day (BID) | ORAL | Status: DC
  Administered 2021-01-13 – 2021-01-22 (×17): 8.6 mg via ORAL
  Filled 2021-01-13 (×20): qty 1

## 2021-01-13 MED ORDER — HYDROCODONE-ACETAMINOPHEN 5-325 MG PO TABS
1.0000 | ORAL_TABLET | ORAL | Status: DC | PRN
  Administered 2021-01-13: 1 via ORAL
  Administered 2021-01-13: 2 via ORAL
  Administered 2021-01-13: 1 via ORAL
  Administered 2021-01-14 (×2): 2 via ORAL
  Administered 2021-01-15: 1 via ORAL
  Administered 2021-01-15: 2 via ORAL
  Filled 2021-01-13: qty 2
  Filled 2021-01-13: qty 1
  Filled 2021-01-13 (×2): qty 2
  Filled 2021-01-13 (×2): qty 1
  Filled 2021-01-13: qty 2

## 2021-01-13 MED ORDER — ACETAMINOPHEN 650 MG RE SUPP: 650.0000 mg | Freq: Four times a day (QID) | RECTAL | Status: DC | PRN

## 2021-01-13 MED ORDER — ROSUVASTATIN CALCIUM 20 MG PO TABS
40.0000 mg | ORAL_TABLET | Freq: Every day | ORAL | Status: DC
  Administered 2021-01-14 – 2021-01-22 (×9): 40 mg via ORAL
  Filled 2021-01-13 (×10): qty 2

## 2021-01-13 MED ORDER — ONDANSETRON HCL 4 MG/2ML IJ SOLN
4.0000 mg | Freq: Four times a day (QID) | INTRAMUSCULAR | Status: DC | PRN
  Administered 2021-01-13: 4 mg via INTRAVENOUS
  Filled 2021-01-13: qty 2

## 2021-01-13 MED ORDER — SODIUM CHLORIDE 0.9 % IV SOLN: INTRAVENOUS | Status: DC

## 2021-01-13 MED ORDER — CALCIUM CARBONATE-VITAMIN D 500-200 MG-UNIT PO TABS
1.0000 | ORAL_TABLET | Freq: Every day | ORAL | Status: DC
  Administered 2021-01-14 – 2021-01-22 (×9): 1 via ORAL
  Filled 2021-01-13 (×10): qty 1

## 2021-01-13 MED ORDER — ONDANSETRON HCL 4 MG PO TABS: 4.0000 mg | ORAL_TABLET | Freq: Four times a day (QID) | ORAL | Status: DC | PRN

## 2021-01-13 MED ORDER — METHOCARBAMOL 1000 MG/10ML IJ SOLN
500.0000 mg | Freq: Four times a day (QID) | INTRAVENOUS | Status: DC | PRN
  Administered 2021-01-13: 500 mg via INTRAVENOUS
  Filled 2021-01-13: qty 500
  Filled 2021-01-13: qty 5

## 2021-01-13 MED ORDER — POTASSIUM CHLORIDE CRYS ER 20 MEQ PO TBCR
20.0000 meq | EXTENDED_RELEASE_TABLET | Freq: Two times a day (BID) | ORAL | Status: DC
  Administered 2021-01-13 – 2021-01-22 (×20): 20 meq via ORAL
  Filled 2021-01-13 (×3): qty 1
  Filled 2021-01-13: qty 2
  Filled 2021-01-13 (×14): qty 1
  Filled 2021-01-13: qty 2
  Filled 2021-01-13: qty 1

## 2021-01-13 MED ORDER — AMLODIPINE BESYLATE 5 MG PO TABS
5.0000 mg | ORAL_TABLET | Freq: Two times a day (BID) | ORAL | Status: DC
  Administered 2021-01-13 – 2021-01-22 (×20): 5 mg via ORAL
  Filled 2021-01-13 (×20): qty 1

## 2021-01-13 MED ORDER — IRBESARTAN 300 MG PO TABS
300.0000 mg | ORAL_TABLET | Freq: Every day | ORAL | Status: DC
  Administered 2021-01-14 – 2021-01-22 (×9): 300 mg via ORAL
  Filled 2021-01-13 (×10): qty 1

## 2021-01-13 MED ORDER — POLYETHYLENE GLYCOL 3350 17 G PO PACK: 17.0000 g | PACK | Freq: Every day | ORAL | Status: DC | PRN

## 2021-01-13 MED ORDER — FENOFIBRATE 54 MG PO TABS
54.0000 mg | ORAL_TABLET | Freq: Every day | ORAL | Status: DC
  Administered 2021-01-14 – 2021-01-22 (×9): 54 mg via ORAL
  Filled 2021-01-13 (×10): qty 1

## 2021-01-13 MED ORDER — LORATADINE 10 MG PO TABS
10.0000 mg | ORAL_TABLET | Freq: Every day | ORAL | Status: DC
  Administered 2021-01-13 – 2021-01-22 (×10): 10 mg via ORAL
  Filled 2021-01-13 (×10): qty 1

## 2021-01-13 MED ORDER — DOCUSATE SODIUM 100 MG PO CAPS
100.0000 mg | ORAL_CAPSULE | Freq: Two times a day (BID) | ORAL | Status: DC
  Administered 2021-01-13 – 2021-01-22 (×17): 100 mg via ORAL
  Filled 2021-01-13 (×20): qty 1

## 2021-01-13 MED ORDER — GABAPENTIN 300 MG PO CAPS
300.0000 mg | ORAL_CAPSULE | Freq: Three times a day (TID) | ORAL | Status: DC
  Administered 2021-01-13 – 2021-01-22 (×27): 300 mg via ORAL
  Filled 2021-01-13 (×27): qty 1

## 2021-01-13 MED ORDER — LEFLUNOMIDE 20 MG PO TABS
20.0000 mg | ORAL_TABLET | Freq: Every day | ORAL | Status: DC
  Administered 2021-01-14 – 2021-01-22 (×9): 20 mg via ORAL
  Filled 2021-01-13 (×10): qty 1

## 2021-01-13 MED ORDER — ACETAMINOPHEN 325 MG PO TABS
650.0000 mg | ORAL_TABLET | Freq: Four times a day (QID) | ORAL | Status: DC | PRN
  Administered 2021-01-15: 650 mg via ORAL
  Filled 2021-01-13: qty 2

## 2021-01-13 MED ORDER — ASPIRIN EC 81 MG PO TBEC
81.0000 mg | DELAYED_RELEASE_TABLET | Freq: Every day | ORAL | Status: DC
  Administered 2021-01-13 – 2021-01-22 (×10): 81 mg via ORAL
  Filled 2021-01-13 (×10): qty 1

## 2021-01-13 NOTE — Progress Notes (Signed)
Orthopedic Tech Progress Note Patient Details:  VAUNDA GUTTERMAN 02/12/1951 [id number]  Ortho Devices Type of Ortho Device: Post (short leg) splint Ortho Device/Splint Location: rle Ortho Device/Splint Interventions: Ordered,Application,Adjustment   Post Interventions Patient Tolerated: Well Instructions Provided: Care of device,Adjustment of device   Karolee Stamps 01/13/2021, 1:51 AM

## 2021-01-13 NOTE — Progress Notes (Signed)
Orthopedic Tech Progress Note Patient Details:  Morgan Bell 08/02/1951 [id number]  Patient ID: [name], female   DOB: [birth year], 70 y.o.   MRN: [id number] Called order into [facility]  Karolee Stamps 01/13/2021, 12:34 AM

## 2021-01-13 NOTE — ED Notes (Signed)
[facility] called for transport 

## 2021-01-13 NOTE — ED Notes (Signed)
Ortho at bedside.

## 2021-01-13 NOTE — H&P (Addendum)
Morgan Bell is an 70 y.o. female.   HPI:  70 year old female presented to the ED tonight after sustaining a fall last Saturday.  States that ever since her fall on Saturday her legs have progressively gotten weaker with each fall.  She is fallen at least twice a day since Saturday.  Her son has had to come over and help her get up off the floor.  She is unable to walk.  She does have a walker at home that she ambulates with.  States that her walking is very robotic.  Has moderate mid back pain with no numbness tingling in her legs.  Sensation is intact.  She does have pain in both of her knees.  Denies any change in her bowel bladder habits.  Dr. Ellene Route has performed several spine surgeries on her.  Her last one was last May and she thinks that this was a thoracic kyphoplasty.  She does have a history of lumbar fusion.  Denies any blood thinners.  It does look like she saw Dr. Ellene Route last May for compression fracture of T8 and T9.  She lives at home alone.  States that she does have osteoporosis but has not gotten any treatment.  According to her imaging she has had multiple spinal fractures.  Past Medical History:  Diagnosis Date  . Arthritis    RA  . Exertional angina ([place]) 07/16/2018   CT coronary 09/13/18: Coronary Ca score 62.1, 68% for age/sex matched control, No evidence of obstructive CAD (1-24% LAD, RCA)  . Family history of adverse reaction to anesthesia    son has difficulty waking   . Headache(784.0)    HX MIGRAINES   . Hypertension   . Melanoma ([place])   . Obesity   . Stroke [facility])    MINI STROKE     21 YRS AGO     Past Surgical History:  Procedure Laterality Date  . APPLICATION OF ROBOTIC ASSISTANCE FOR SPINAL PROCEDURE N/A 10/18/2019   Procedure: APPLICATION OF ROBOTIC ASSISTANCE FOR SPINAL PROCEDURE;  Surgeon: Kristeen Miss, MD;  Location: [facility];  Service: Neurosurgery;  Laterality: N/A;  . CESAREAN SECTION  11/26/1973  . CESAREAN SECTION  09/22/1975  . CESAREAN SECTION   04/30/1978  . HEMORRHOID SURGERY  06/1987  . MELANOMA EXCISION WITH SENTINEL LYMPH NODE BIOPSY Right 03/02/2013   Procedure: Wide MELANOMA EXCISION Right Abdominal wall WITH SENTINEL LYMPH NODE mapping Right Axilla;  Surgeon: Merrie Roof, MD;  Location: [facility];  Service: General;  Laterality: Right;  . NM [place]  2003   negative Bruce protocol exercise stress test with no evidence of perfusion abnormality, EF 66%  . TOTAL HIP ARTHROPLASTY Right 03/2011    Allergies  Allergen Reactions  . Sulfa Antibiotics Rash    SULFA DRUGS    Social History   Tobacco Use  . Smoking status: Never Smoker  . Smokeless tobacco: Never Used  Substance Use Topics  . Alcohol use: No    Family History  Problem Relation Age of Onset  . Alzheimer's disease Mother   . Heart disease Father        MI, died at 68  . Alzheimer's disease Maternal Grandmother   . Cancer Maternal Grandfather        lung  . Heart attack Paternal Grandfather   . Tuberculosis Sister      Review of Systems  Positive ROS: As above  All other systems have been reviewed and were otherwise negative  with the exception of those mentioned in the HPI and as above.  Objective: Vital signs in last 24 hours: Temp:  [98 F (36.7 C)-98.1 F (36.7 C)] 98.1 F (36.7 C) (04/02 1639) Pulse Rate:  [86-101] 86 (04/02 2330) Resp:  [13-20] 18 (04/02 2330) BP: (132-171)/(48-118) 138/96 (04/02 2330) SpO2:  [94 %-100 %] 94 % (04/02 2330) Weight:  [60.2 kg] 60.2 kg (04/02 1639)  General Appearance: Alert, cooperative, no distress, appears stated age Head: Normocephalic, without obvious abnormality, atraumatic Eyes: PERRL, conjunctiva/corneas clear, EOM's intact, fundi benign, both eyes      Back: Symmetric, no curvature, ROM normal, no CVA tenderness Lungs:  respirations unlabored Heart: Regular rate and rhythm  NEUROLOGIC:   Mental status: A&O x4, no aphasia, good attention span, Memory and fund of knowledge Motor  Exam - grossly normal, normal tone and bulk Sensory Exam - grossly normal Reflexes: symmetric, no pathologic reflexes, No Hoffman's, No clonus Coordination - grossly normal Gait -not tested Balance -not tested Cranial Nerves: I: smell Not tested  II: visual acuity  OS: na    OD: na  II: visual fields Full to confrontation  II: pupils Equal, round, reactive to light  III,VII: ptosis None  III,IV,VI: extraocular muscles  Full ROM  V: mastication   V: facial light touch sensation    V,VII: corneal reflex    VII: facial muscle function - upper    VII: facial muscle function - lower   VIII: hearing   IX: soft palate elevation    IX,X: gag reflex   XI: trapezius strength    XI: sternocleidomastoid strength   XI: neck flexion strength    XII: tongue strength      Data Review Lab Results  Component Value Date   WBC 7.3 01/12/2021   HGB 13.2 01/12/2021   HCT 41.0 01/12/2021   MCV 91.9 01/12/2021   PLT 139 (L) 01/12/2021   Lab Results  Component Value Date   NA 140 01/12/2021   K 3.7 01/12/2021   CL 107 01/12/2021   CO2 21 (L) 01/12/2021   BUN 26 (H) 01/12/2021   CREATININE 1.26 (H) 01/12/2021   GLUCOSE 97 01/12/2021   Lab Results  Component Value Date   INR 1.0 05/13/2020    Radiology: DG Chest 2 View  Result Date: 01/12/2021 CLINICAL DATA:  Weakness.  Multiple falls. EXAM: CHEST - 2 VIEW COMPARISON:  Mar 08, 2020 FINDINGS: The heart, hila, and mediastinum are unremarkable. No pneumothorax. No nodules or masses. Stable cardiomediastinal silhouette. Pedicle rods and screws are seen in the lumbar spine. Multilevel kyphoplasties identified. Mild anterior wedging of T10 is stable since March 28, 2020. No acute fractures are seen. IMPRESSION: No acute abnormalities identified. Electronically Signed   By: Dorise Bullion III M.D   On: 01/12/2021 16:30   DG Ankle Complete Left  Result Date: 01/12/2021 CLINICAL DATA:  Pain after fall EXAM: LEFT ANKLE COMPLETE - 3+ VIEW  COMPARISON:  None. FINDINGS: Bilateral soft tissue swelling. No acute ankle fracture or dislocation. Severe degenerative changes in the hindfoot. Absence of cuneiform bones seen laterally, likely from a previous surgery. No other acute abnormalities. IMPRESSION: 1. Bilateral soft tissue swelling. 2. No acute fractures identified. 3. Probable postoperative changes in the midfoot laterally. Recommend clinical correlation. 4. Degenerative changes. Electronically Signed   By: Dorise Bullion III M.D   On: 01/12/2021 13:58   DG Ankle Complete Right  Result Date: 01/12/2021 CLINICAL DATA:  Pain after fall EXAM: RIGHT ANKLE -  COMPLETE 3+ VIEW COMPARISON:  None. FINDINGS: Severe degenerative changes in the hindfoot. Probable postoperative changes in the region of the lateral cuneiform with probable resection. Bilateral soft tissue swelling. No acute fractures identified in the ankle. There appears to be a cortical break in the distal fifth metacarpal on the lateral view and probably the middle fifth metacarpal on the oblique view. No other acute fractures or abnormalities identified. IMPRESSION: 1. Suggested fractures of the fifth metacarpal distally and in its midportion, incompletely evaluated. Recommend dedicated imaging of the right foot. 2. Severe degenerative changes in the hindfoot. 3. No ankle fractures identified.  Bilateral soft tissue swelling. 4. Suspected postsurgical changes in the region of the lateral cuneiforms. Electronically Signed   By: Dorise Bullion III M.D   On: 01/12/2021 14:02   CT Head Wo Contrast  Result Date: 01/12/2021 CLINICAL DATA:  Neuro deficit, acute stroke suspected. Multiple falls. EXAM: CT HEAD WITHOUT CONTRAST TECHNIQUE: Contiguous axial images were obtained from the base of the skull through the vertex without intravenous contrast. COMPARISON:  06/22/2020. FINDINGS: Brain: No evidence of acute large vascular territory infarction, hemorrhage, hydrocephalus, extra-axial collection  or mass lesion/mass effect. Patchy areas of white matter hypoattenuation appears similar to prior and most likely represent the sequela of chronic microvascular ischemic disease. Vascular: No hyperdense vessel identified. Calcific atherosclerosis. Skull: No acute fracture Sinuses/Orbits: Retention cyst in bilateral maxillary sinuses with areas of mucosal thickening in the maxillary sinuses and ethmoid air cells. Other: No mastoid effusions. IMPRESSION: 1. No evidence of acute intracranial abnormality. 2. Similar presumed chronic microvascular ischemic disease. 3. Paranasal sinus disease, as detailed above. Electronically Signed   By: Margaretha Sheffield MD   On: 01/12/2021 16:27   CT Lumbar Spine Wo Contrast  Result Date: 01/12/2021 CLINICAL DATA:  Low back pain. Progressive neurologic deficit. Multiple falls over the last week. EXAM: CT LUMBAR SPINE WITHOUT CONTRAST TECHNIQUE: Multidetector CT imaging of the lumbar spine was performed without intravenous contrast administration. Multiplanar CT image reconstructions were also generated. COMPARISON:  Lumbar MRI 08/15/2019, thoracic MRI 02/28/2020, radiographs 12/09/2019 and lumbar myelogram CT 09/19/2019. FINDINGS: Segmentation: There are 5 lumbar type vertebral bodies. Alignment: Mild convex right scoliosis and stable grade 1 degenerative anterolisthesis at L5-S1. Vertebrae: Patient is status post posterolateral fusion from T11 through L4. The superior extent of the hardware is not included on this examination which extends to the level of the T11-12 disc. The visualized hardware is intact without loosening. Patient has undergone multilevel spinal augmentation, visualized from T12 through L4 on this examination. The chronic L2 burst fracture and associated osseous retropulsion are grossly stable. Methylmethacrylate within the L2 vertebral body extends into the L1-2 disc, unchanged. There is a new superior endplate compression fracture at L5, resulting in  approximately 25% loss of vertebral body height and fragmentation of the superior endplate asymmetric to the right. No other acute fractures are seen. The sacroiliac joints are degenerated, but intact. Paraspinal and other soft tissues: No acute paraspinal abnormalities. There is fatty atrophy of the erector spinae musculature. Aortoiliac atherosclerosis and bilateral renal cysts are noted. Disc levels: L1-2: Stable disc bulging, endplate osteophytes and osseous retropulsion at L2. The spinal canal is decompressed by previous laminectomies. Mild foraminal narrowing bilaterally. Stable asymmetric narrowing of the right lateral recess. L2-3: Stable disc bulging and endplate osteophytes. The spinal canal is decompressed by the previous laminectomies. No significant foraminal narrowing. L3-4: Disc bulging and endplate osteophytes asymmetric to the left. Stable mild asymmetric narrowing of the left lateral recess  and left foramen. L4-5: Interval superior endplate compression fracture at L5 with progressive annular disc bulging and endplate osteophytes. Moderate facet and ligamentous hypertrophy. Resulting mild spinal stenosis with mild to moderate lateral recess and foraminal narrowing bilaterally. L5-S1: Stable chronic degenerative disc disease with loss of disc height, annular disc bulging and endplate osteophytes. Moderate facet hypertrophy asymmetric the left. Mild chronic foraminal narrowing bilaterally. IMPRESSION: 1. Compared with the most recent radiographs of 5 weeks ago, there is a new superior endplate compression fracture at L5 with approximately 25% loss of vertebral body height and superior endplate fragmentation. 2. No other acute osseous findings status post multilevel spinal augmentation. Grossly stable chronic burst fracture at L2. 3. Intact hardware and stable alignment status post T11 through L4 posterior fusion. The superior extent of the fusion is not imaged on this examination. 4. Multilevel  spondylosis as described. The spinal canal remains adequately decompressed by the previous laminectomies at L1-2 and L2-3. There is mildly progressive spinal stenosis with lateral recess and foraminal narrowing bilaterally at L4-5. Electronically Signed   By: Richardean Sale M.D.   On: 01/12/2021 16:45   MR BRAIN W WO CONTRAST  Result Date: 01/12/2021 CLINICAL DATA:  Initial evaluation for neuro deficit, stroke suspected. EXAM: MRI HEAD WITHOUT AND WITH CONTRAST TECHNIQUE: Multiplanar, multiecho pulse sequences of the brain and surrounding structures were obtained without and with intravenous contrast. CONTRAST:  57mL GADAVIST GADOBUTROL 1 MMOL/ML IV SOLN COMPARISON:  Prior CT from earlier the same day. FINDINGS: Brain: Mild diffuse prominence of the CSF containing spaces compatible generalized cerebral atrophy. Patchy T2/FLAIR hyperintensity within the periventricular deep white matter both cerebral hemispheres, most consistent with chronic small vessel ischemic disease, mild in nature. No abnormal foci of restricted diffusion to suggest acute or subacute ischemia. Gray-white matter differentiation maintained. No encephalomalacia to suggest chronic cortical infarction. No acute intracranial hemorrhage. Focus of chronic hemosiderin staining present at the right cerebellum related to previously identified right cerebellar hemorrhage. Two additional punctate chronic micro hemorrhages noted at the right thalamus and right basal ganglia. No other visible evidence for chronic intracranial hemorrhage. No mass lesion, midline shift or mass effect. No hydrocephalus or extra-axial fluid collection. Pituitary gland suprasellar region within normal limits. Midline structures intact. No abnormal enhancement. Vascular: Major intracranial vascular flow voids are maintained at the skull base. Skull and upper cervical spine: Craniocervical junction within normal limits. Bone marrow signal intensity normal. No focal marrow  replacing lesion. No scalp soft tissue abnormality. Sinuses/Orbits: Globes orbital soft tissues demonstrate no acute finding. Scattered mucosal thickening noted within the ethmoidal air cells and maxillary sinuses. Superimposed right maxillary sinus retention cyst. Trace right mastoid effusion noted, of doubtful significance. Inner ear structures grossly normal. Other: None. IMPRESSION: 1. No acute intracranial abnormality. 2. 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 3. Underlying mild chronic microvascular ischemic disease. Electronically Signed   By: Jeannine Boga M.D.   On: 01/12/2021 22:25   MR THORACIC SPINE W WO CONTRAST  Result Date: 01/12/2021 CLINICAL DATA:  Initial evaluation for cord compression. EXAM: MRI THORACIC AND LUMBAR SPINE WITHOUT AND WITH CONTRAST TECHNIQUE: Multiplanar and multiecho pulse sequences of the thoracic and lumbar spine were obtained without and with intravenous contrast. CONTRAST:  7mL GADAVIST GADOBUTROL 1 MMOL/ML IV SOLN COMPARISON:  Prior CT from earlier the same day. FINDINGS: MRI THORACIC SPINE FINDINGS Alignment: Mild scoliosis with exaggeration of the normal lower thoracic kyphosis. Trace anterolisthesis of T3 on T4. Mild retrolisthesis  of up to 3 mm at T11-12 and T12-L1. Vertebrae: Susceptibility artifact related to prior posterior fusion at T11 through the upper lumbar spine. Compression deformities with sequelae of prior vertebral augmentation present at T8 and T9. Residual marrow edema and enhancement with extension into the posterior elements. Additional mild compression deformity extends through the inferior endplate of T7, chronic in appearance without significant residual marrow edema. Otherwise, vertebral body height maintained, with no other acute fracture identified. Underlying bone marrow signal intensity within normal limits. No discrete osseous lesions. No other abnormal  marrow edema. 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 Signal intensity within the thoracic spinal cord otherwise within normal limits. Paraspinal and other soft tissues: Persistent paraspinous edema adjacent to the T8 and T9 fractures. Paraspinous soft tissues demonstrate no other acute finding. Probable large hiatal hernia partially visualized. Multiple benign appearing cyst noted about the visualized kidneys, with a large 8.6 cm cyst extending from the right kidney. Disc levels: T1-2: Central disc protrusion indents the ventral thecal sac. Bilateral facet hypertrophy. Mild cord flattening without significant spinal stenosis. Foramina remain patent. T2-3: Disc bulge with facet hypertrophy. No significant spinal stenosis. Mild foraminal narrowing. T3-4: Anterolisthesis. Disc bulge with superimposed small right central disc protrusion. Posterior element hypertrophy. No significant spinal stenosis. Mild bilateral foraminal narrowing. T4-5: Minimal disc bulge with posterior element hypertrophy. No stenosis. T5-6: Minimal disc bulge with posterior element hypertrophy. No significant spinal stenosis. Mild left foraminal narrowing. T6-7: Mild disc bulge with posterior element hypertrophy. No stenosis. T7-8: Mild disc bulge with prominent posterior facet hypertrophy. No significant spinal stenosis. Moderate right foraminal narrowing. T8-9: T8 compression fracture with sequelae of prior vertebral augmentation. T1/T2 hypointense material posterior to the T8 vertebral body measures 1.5 x 0.7 x 1.1 cm, favored to reflect extruded disc material. Compression and effacement of the ventral thecal sac. Superimposed advanced bilateral facet hypertrophy. Resultant severe spinal stenosis with compression of the thoracic cord at this level. Thecal sac measures 3 mm in AP diameter at its most narrow point. Associated cord signal  changes concerning for edema and/or myelomalacia. T9-10: Tiny central disc protrusion minimally indents the ventral thecal sac. No significant spinal stenosis. Foramina remain patent. T10-11: Mild disc bulge. No significant spinal stenosis. Foramina remain patent. T11-12:  Prior posterior fusion.  No residual stenosis. T12-L1: Prior posterior fusion. Mild right eccentric disc bulge. No significant stenosis. MRI LUMBAR SPINE FINDINGS Segmentation: Standard. Lowest well-formed disc space labeled the L5-S1 level. Alignment: Mild dextroscoliosis with stable mild grade 1 anterolisthesis of L5 on S1. Vertebrae: Prior posterior fusion at T11 through L4. Sequelae of prior vertebral augmentation at T11 through L4. Severe chronic L2 burst fracture is stable and chronic in appearance. Additional compression deformities involving the inferior endplate of L1 also chronic in appearance. Mild compression deformity involving the superior endplate of L5 with up to 25% height loss and trace 4 mm bony retropulsion is late subacute in appearance with relatively mild residual marrow edema and enhancement. Signal change at the adjacent inferior endplate of L4 favored to be discogenic in nature. No other acute or subacute compression fracture within the lumbar spine. Underlying bone marrow signal intensity within normal limits. No worrisome osseous lesions. Conus medullaris: Extends to the L1-2 level and appears normal. Paraspinal and other soft tissues: Disc levels: L1-2: Disc bulge with up to 7 mm bony retropulsion related to the chronic L2 compression fracture. Prior posterior decompression with  fusion. Residual mild narrowing of the right lateral recess. Spinal canal self remains widely patent. Foramina appear patent as well. L2-3: Disc bulge with reactive endplate spurring. Prior posterior decompression with fusion. Residual facet hypertrophy. No residual spinal stenosis. Superimposed right foraminal disc protrusion closely  approximates the exiting right L2 nerve root with associated mild right foraminal stenosis (series 19, image 6). Left neural foramina remains patent. No residual spinal stenosis. L3-4: Mild disc bulge with reactive endplate change. Prior posterior fusion. Moderate facet hypertrophy. No significant residual spinal stenosis. Mild left L3 foraminal narrowing. Right neural foramen remains patent. L4-5: Disc bulge with reactive endplate change. Advanced bilateral facet arthrosis. Resultant moderate canal with bilateral lateral recess stenosis, worse on the right. Moderate right worse than left L4 foraminal narrowing. L5-S1: Anterolisthesis. Mild disc bulge with reactive endplate spurring. Moderate facet hypertrophy. No significant spinal stenosis. Moderate right worse than left L5 foraminal narrowing. IMPRESSION: MRI THORACIC SPINE IMPRESSION: 1. Multifactorial degenerative changes at T8-9 with resultant severe spinal stenosis and cord impingement. 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 Soft tissue density within the ventral epidural space at this level favored to reflect extruded disc material, although correlation with CT could be performed to ensure no extruded cement is contributing could be performed as clinically desired. 2. Compression fractures at T8 and T9 with sequelae of prior vertebral augmentation, with persistent marrow edema and enhancement. 3. Large hiatal hernia. MRI LUMBAR SPINE IMPRESSION: 1. Late subacute compression fracture at the superior endplate of L5 with mild 25% height loss and 4 mm bony retropulsion. 2. No other acute abnormality within the lumbar spine. No evidence for cord compression. 3. Sequelae of prior posterior decompression at fusion at T11 through L4 with underlying multilevel degenerative spondylosis as above. Resultant moderate spinal stenosis at L4-5, with moderate bilateral L4 and L5 foraminal narrowing. Critical Value/emergent results were called  by telephone at the time of interpretation on 01/12/2021 at 11:07 pm to provider Dr. Tamera Punt, Who verbally acknowledged these results. Electronically Signed   By: Jeannine Boga M.D.   On: 01/12/2021 23:14   MR Lumbar Spine W Wo Contrast  Result Date: 01/12/2021 CLINICAL DATA:  Initial evaluation for cord compression. EXAM: MRI THORACIC AND LUMBAR SPINE WITHOUT AND WITH CONTRAST TECHNIQUE: Multiplanar and multiecho pulse sequences of the thoracic and lumbar spine were obtained without and with intravenous contrast. CONTRAST:  39mL GADAVIST GADOBUTROL 1 MMOL/ML IV SOLN COMPARISON:  Prior CT from earlier the same day. FINDINGS: MRI THORACIC SPINE FINDINGS Alignment: Mild scoliosis with exaggeration of the normal lower thoracic kyphosis. Trace anterolisthesis of T3 on T4. Mild retrolisthesis of up to 3 mm at T11-12 and T12-L1. Vertebrae: Susceptibility artifact related to prior posterior fusion at T11 through the upper lumbar spine. Compression deformities with sequelae of prior vertebral augmentation present at T8 and T9. Residual marrow edema and enhancement with extension into the posterior elements. Additional mild compression deformity extends through the inferior endplate of T7, chronic in appearance without significant residual marrow edema. Otherwise, vertebral body height maintained, with no other acute fracture identified. Underlying bone marrow signal intensity within normal limits. No discrete osseous lesions. No other abnormal marrow edema. 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 Signal intensity within the thoracic spinal cord otherwise within normal limits. Paraspinal and other soft tissues: Persistent paraspinous edema adjacent to the T8 and T9 fractures. Paraspinous soft tissues demonstrate no other acute finding. Probable  large hiatal hernia partially visualized. Multiple benign appearing  cyst noted about the visualized kidneys, with a large 8.6 cm cyst extending from the right kidney. Disc levels: T1-2: Central disc protrusion indents the ventral thecal sac. Bilateral facet hypertrophy. Mild cord flattening without significant spinal stenosis. Foramina remain patent. T2-3: Disc bulge with facet hypertrophy. No significant spinal stenosis. Mild foraminal narrowing. T3-4: Anterolisthesis. Disc bulge with superimposed small right central disc protrusion. Posterior element hypertrophy. No significant spinal stenosis. Mild bilateral foraminal narrowing. T4-5: Minimal disc bulge with posterior element hypertrophy. No stenosis. T5-6: Minimal disc bulge with posterior element hypertrophy. No significant spinal stenosis. Mild left foraminal narrowing. T6-7: Mild disc bulge with posterior element hypertrophy. No stenosis. T7-8: Mild disc bulge with prominent posterior facet hypertrophy. No significant spinal stenosis. Moderate right foraminal narrowing. T8-9: T8 compression fracture with sequelae of prior vertebral augmentation. T1/T2 hypointense material posterior to the T8 vertebral body measures 1.5 x 0.7 x 1.1 cm, favored to reflect extruded disc material. Compression and effacement of the ventral thecal sac. Superimposed advanced bilateral facet hypertrophy. Resultant severe spinal stenosis with compression of the thoracic cord at this level. Thecal sac measures 3 mm in AP diameter at its most narrow point. Associated cord signal changes concerning for edema and/or myelomalacia. T9-10: Tiny central disc protrusion minimally indents the ventral thecal sac. No significant spinal stenosis. Foramina remain patent. T10-11: Mild disc bulge. No significant spinal stenosis. Foramina remain patent. T11-12:  Prior posterior fusion.  No residual stenosis. T12-L1: Prior posterior fusion. Mild right eccentric disc bulge. No significant stenosis. MRI LUMBAR SPINE FINDINGS Segmentation: Standard. Lowest well-formed  disc space labeled the L5-S1 level. Alignment: Mild dextroscoliosis with stable mild grade 1 anterolisthesis of L5 on S1. Vertebrae: Prior posterior fusion at T11 through L4. Sequelae of prior vertebral augmentation at T11 through L4. Severe chronic L2 burst fracture is stable and chronic in appearance. Additional compression deformities involving the inferior endplate of L1 also chronic in appearance. Mild compression deformity involving the superior endplate of L5 with up to 25% height loss and trace 4 mm bony retropulsion is late subacute in appearance with relatively mild residual marrow edema and enhancement. Signal change at the adjacent inferior endplate of L4 favored to be discogenic in nature. No other acute or subacute compression fracture within the lumbar spine. Underlying bone marrow signal intensity within normal limits. No worrisome osseous lesions. Conus medullaris: Extends to the L1-2 level and appears normal. Paraspinal and other soft tissues: Disc levels: L1-2: Disc bulge with up to 7 mm bony retropulsion related to the chronic L2 compression fracture. Prior posterior decompression with fusion. Residual mild narrowing of the right lateral recess. Spinal canal self remains widely patent. Foramina appear patent as well. L2-3: Disc bulge with reactive endplate spurring. Prior posterior decompression with fusion. Residual facet hypertrophy. No residual spinal stenosis. Superimposed right foraminal disc protrusion closely approximates the exiting right L2 nerve root with associated mild right foraminal stenosis (series 19, image 6). Left neural foramina remains patent. No residual spinal stenosis. L3-4: Mild disc bulge with reactive endplate change. Prior posterior fusion. Moderate facet hypertrophy. No significant residual spinal stenosis. Mild left L3 foraminal narrowing. Right neural foramen remains patent. L4-5: Disc bulge with reactive endplate change. Advanced bilateral facet arthrosis. Resultant  moderate canal with bilateral lateral recess stenosis, worse on the right. Moderate right worse than left L4 foraminal narrowing. L5-S1: Anterolisthesis. Mild disc bulge with reactive endplate spurring. Moderate facet hypertrophy. No significant spinal stenosis. Moderate right worse than left  L5 foraminal narrowing. IMPRESSION: MRI THORACIC SPINE IMPRESSION: 1. Multifactorial degenerative changes at T8-9 with resultant severe spinal stenosis and cord impingement. 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 Soft tissue density within the ventral epidural space at this level favored to reflect extruded disc material, although correlation with CT could be performed to ensure no extruded cement is contributing could be performed as clinically desired. 2. Compression fractures at T8 and T9 with sequelae of prior vertebral augmentation, with persistent marrow edema and enhancement. 3. Large hiatal hernia. MRI LUMBAR SPINE IMPRESSION: 1. Late subacute compression fracture at the superior endplate of L5 with mild 25% height loss and 4 mm bony retropulsion. 2. No other acute abnormality within the lumbar spine. No evidence for cord compression. 3. Sequelae of prior posterior decompression at fusion at T11 through L4 with underlying multilevel degenerative spondylosis as above. Resultant moderate spinal stenosis at L4-5, with moderate bilateral L4 and L5 foraminal narrowing. Critical Value/emergent results were called by telephone at the time of interpretation on 01/12/2021 at 11:07 pm to provider Dr. Tamera Punt, Who verbally acknowledged these results. Electronically Signed   By: Jeannine Boga M.D.   On: 01/12/2021 23:14   DG Foot Complete Right  Result Date: 01/12/2021 CLINICAL DATA:  Multiple falls over the last week with pain in the bilateral ankles and knees. Patient broke her right foot last week by report. EXAM: RIGHT FOOT COMPLETE - 3+ VIEW COMPARISON:  None. FINDINGS: There is a fracture  through the distal fifth metatarsal. A subtle fracture through the proximal aspect of the proximal fifth phalanx is not excluded. No other fractures are identified. IMPRESSION: Definite fracture through the distal fifth metatarsal. Possible subtle fracture through the proximal fifth phalanx. Electronically Signed   By: Dorise Bullion III M.D   On: 01/12/2021 16:28   DG Knee AP/LAT W/Sunrise Left  Result Date: 01/12/2021 CLINICAL DATA:  Pain after fall EXAM: LEFT KNEE 3 VIEWS COMPARISON:  January 02, 2020 FINDINGS: A mild contour abnormality of the proximal left fibula is consistent with previous injury, better seen on the January 02, 2020 study. No acute fibular fracture noted. Mild tricompartmental degenerative changes. No joint effusion or acute fracture. IMPRESSION: Mild degenerative changes.  No acute fracture or effusion. Electronically Signed   By: Dorise Bullion III M.D   On: 01/12/2021 13:55   DG Knee AP/LAT W/Sunrise Right  Result Date: 01/12/2021 CLINICAL DATA:  Pain after fall EXAM: RIGHT KNEE 3 VIEWS COMPARISON:  None. FINDINGS: Mild anterior soft tissue swelling. Mild tricompartmental degenerative changes. No fracture or effusion. IMPRESSION: Mild anterior soft tissue swelling.  No other acute abnormalities. Electronically Signed   By: Dorise Bullion III M.D   On: 01/12/2021 13:56    Assessment/Plan: 70 year old female presented to the ED tonight after sustaining a fall last Saturday with progressive leg weakness and difficulty ambulating.  MRI thoracic spine shows posterior fixation from T11-L4.  Evidence of kyphoplasty at T8 and T9, she does appear to have what may be disc fragment at T8-T9 causing severe spinal stenosis and cord compression.  Her strength is actually quite good when she is lying in bed, 4+ out of 5 bilaterally. She is not hyperreflexic and does not have clonus. We will admit her to [facility] for surgical planning.   Ocie Cornfield [facility] 01/13/2021 12:32 AM

## 2021-01-14 ENCOUNTER — Other Ambulatory Visit: Payer: Self-pay | Admitting: Neurological Surgery

## 2021-01-14 LAB — SURGICAL PCR SCREEN
MRSA, PCR: NEGATIVE
Staphylococcus aureus: NEGATIVE

## 2021-01-14 NOTE — Progress Notes (Signed)
Patient ID: Morgan Bell, female   DOB: 1951-10-06, 70 y.o.   MRN: [id number] Vital signs are stable Motor function appears to be good in the lower extremities She is uncomfortable with her back Expressed the need for surgery and discussed the surgery with her We will plan this for tomorrow

## 2021-01-15 ENCOUNTER — Inpatient Hospital Stay (HOSPITAL_COMMUNITY): Payer: Medicare Other | Admitting: Certified Registered"

## 2021-01-15 ENCOUNTER — Encounter (HOSPITAL_COMMUNITY)
Admission: EM | Disposition: A | Discharge status: Skilled Nursing Facility | Payer: Self-pay | Source: Home / Self Care | Attending: Neurological Surgery

## 2021-01-15 ENCOUNTER — Inpatient Hospital Stay (HOSPITAL_COMMUNITY): Payer: Medicare Other

## 2021-01-15 ENCOUNTER — Encounter (HOSPITAL_COMMUNITY): Payer: Self-pay | Admitting: Certified Registered"

## 2021-01-15 HISTORY — PX: LUMBAR LAMINECTOMY/DECOMPRESSION MICRODISCECTOMY: SHX5026

## 2021-01-15 SURGERY — LUMBAR LAMINECTOMY/DECOMPRESSION MICRODISCECTOMY 1 LEVEL
Anesthesia: General | Site: Back

## 2021-01-15 MED ORDER — DEXAMETHASONE SODIUM PHOSPHATE 10 MG/ML IJ SOLN
INTRAMUSCULAR | Status: DC | PRN
  Administered 2021-01-15: 10 mg via INTRAVENOUS

## 2021-01-15 MED ORDER — MENTHOL 3 MG MT LOZG: 1.0000 | LOZENGE | OROMUCOSAL | Status: DC | PRN

## 2021-01-15 MED ORDER — BISACODYL 10 MG RE SUPP: 10.0000 mg | Freq: Every day | RECTAL | Status: DC | PRN

## 2021-01-15 MED ORDER — KETOROLAC TROMETHAMINE 15 MG/ML IJ SOLN
7.5000 mg | Freq: Four times a day (QID) | INTRAMUSCULAR | Status: AC
  Administered 2021-01-15 – 2021-01-16 (×4): 7.5 mg via INTRAVENOUS
  Filled 2021-01-15 (×4): qty 1

## 2021-01-15 MED ORDER — LACTATED RINGERS IV SOLN: INTRAVENOUS | Status: DC | PRN

## 2021-01-15 MED ORDER — PHENYLEPHRINE HCL-NACL 10-0.9 MG/250ML-% IV SOLN
INTRAVENOUS | Status: DC | PRN
  Administered 2021-01-15: 50 ug/min via INTRAVENOUS

## 2021-01-15 MED ORDER — SENNA 8.6 MG PO TABS: 1.0000 | ORAL_TABLET | Freq: Two times a day (BID) | ORAL | Status: DC

## 2021-01-15 MED ORDER — CEFAZOLIN SODIUM-DEXTROSE 2-4 GM/100ML-% IV SOLN
2.0000 g | Freq: Three times a day (TID) | INTRAVENOUS | Status: AC
  Administered 2021-01-16 (×2): 2 g via INTRAVENOUS
  Filled 2021-01-15 (×2): qty 100

## 2021-01-15 MED ORDER — LIDOCAINE 2% (20 MG/ML) 5 ML SYRINGE
INTRAMUSCULAR | Status: DC | PRN
  Administered 2021-01-15: 60 mg via INTRAVENOUS

## 2021-01-15 MED ORDER — PHENOL 1.4 % MT LIQD: 1.0000 | OROMUCOSAL | Status: DC | PRN

## 2021-01-15 MED ORDER — THROMBIN 5000 UNITS EX SOLR
OROMUCOSAL | Status: DC | PRN
  Administered 2021-01-15: 5 mL via TOPICAL

## 2021-01-15 MED ORDER — DEXAMETHASONE SODIUM PHOSPHATE 10 MG/ML IJ SOLN
INTRAMUSCULAR | Status: AC
  Filled 2021-01-15: qty 1

## 2021-01-15 MED ORDER — FENTANYL CITRATE (PF) 250 MCG/5ML IJ SOLN
INTRAMUSCULAR | Status: AC
  Filled 2021-01-15: qty 5

## 2021-01-15 MED ORDER — SODIUM CHLORIDE 0.9 % IV SOLN: 250.0000 mL | INTRAVENOUS | Status: DC

## 2021-01-15 MED ORDER — ROCURONIUM BROMIDE 10 MG/ML (PF) SYRINGE
PREFILLED_SYRINGE | INTRAVENOUS | Status: AC
  Filled 2021-01-15: qty 10

## 2021-01-15 MED ORDER — PROPOFOL 10 MG/ML IV BOLUS
INTRAVENOUS | Status: AC
  Filled 2021-01-15: qty 20

## 2021-01-15 MED ORDER — SODIUM CHLORIDE 0.9% FLUSH: 3.0000 mL | INTRAVENOUS | Status: DC | PRN

## 2021-01-15 MED ORDER — THROMBIN 5000 UNITS EX SOLR
CUTANEOUS | Status: AC
  Filled 2021-01-15: qty 5000

## 2021-01-15 MED ORDER — ORAL CARE MOUTH RINSE: 15.0000 mL | Freq: Once | OROMUCOSAL | Status: AC

## 2021-01-15 MED ORDER — HYDROCODONE-ACETAMINOPHEN 5-325 MG PO TABS
1.0000 | ORAL_TABLET | ORAL | Status: DC | PRN
  Administered 2021-01-16 – 2021-01-18 (×5): 2 via ORAL
  Administered 2021-01-19 (×2): 1 via ORAL
  Administered 2021-01-20 (×3): 2 via ORAL
  Administered 2021-01-20: 1 via ORAL
  Administered 2021-01-21: 2 via ORAL
  Administered 2021-01-21 – 2021-01-22 (×2): 1 via ORAL
  Administered 2021-01-22: 2 via ORAL
  Filled 2021-01-15 (×2): qty 2
  Filled 2021-01-15: qty 1
  Filled 2021-01-15: qty 2
  Filled 2021-01-15: qty 1
  Filled 2021-01-15: qty 2
  Filled 2021-01-15 (×2): qty 1
  Filled 2021-01-15 (×8): qty 2

## 2021-01-15 MED ORDER — METHOCARBAMOL 1000 MG/10ML IJ SOLN
500.0000 mg | Freq: Four times a day (QID) | INTRAVENOUS | Status: DC | PRN
  Filled 2021-01-15: qty 5

## 2021-01-15 MED ORDER — SUGAMMADEX SODIUM 200 MG/2ML IV SOLN
INTRAVENOUS | Status: DC | PRN
  Administered 2021-01-15: 200 mg via INTRAVENOUS

## 2021-01-15 MED ORDER — ACETAMINOPHEN 500 MG PO TABS
1000.0000 mg | ORAL_TABLET | Freq: Once | ORAL | Status: AC
  Administered 2021-01-15: 1000 mg via ORAL
  Filled 2021-01-15: qty 2

## 2021-01-15 MED ORDER — ONDANSETRON HCL 4 MG/2ML IJ SOLN
INTRAMUSCULAR | Status: AC
  Filled 2021-01-15: qty 2

## 2021-01-15 MED ORDER — OXYCODONE HCL 5 MG PO TABS
5.0000 mg | ORAL_TABLET | Freq: Once | ORAL | Status: DC | PRN
Start: 2021-01-15 — End: 2021-01-15

## 2021-01-15 MED ORDER — ALUM & MAG HYDROXIDE-SIMETH 200-200-20 MG/5ML PO SUSP: 30.0000 mL | Freq: Four times a day (QID) | ORAL | Status: DC | PRN

## 2021-01-15 MED ORDER — FENTANYL CITRATE (PF) 100 MCG/2ML IJ SOLN: 25.0000 ug | INTRAMUSCULAR | Status: DC | PRN

## 2021-01-15 MED ORDER — FLEET ENEMA 7-19 GM/118ML RE ENEM: 1.0000 | ENEMA | Freq: Once | RECTAL | Status: DC | PRN

## 2021-01-15 MED ORDER — LIDOCAINE 2% (20 MG/ML) 5 ML SYRINGE
INTRAMUSCULAR | Status: AC
  Filled 2021-01-15: qty 5

## 2021-01-15 MED ORDER — ACETAMINOPHEN 325 MG PO TABS
650.0000 mg | ORAL_TABLET | ORAL | Status: DC | PRN
  Administered 2021-01-18 – 2021-01-21 (×2): 650 mg via ORAL
  Filled 2021-01-15 (×2): qty 2

## 2021-01-15 MED ORDER — MORPHINE SULFATE (PF) 2 MG/ML IV SOLN
2.0000 mg | INTRAVENOUS | Status: DC | PRN
Start: 2021-01-15 — End: 2021-01-22

## 2021-01-15 MED ORDER — BUPIVACAINE HCL (PF) 0.5 % IJ SOLN
INTRAMUSCULAR | Status: AC
  Filled 2021-01-15: qty 30

## 2021-01-15 MED ORDER — EPHEDRINE SULFATE-NACL 50-0.9 MG/10ML-% IV SOSY
PREFILLED_SYRINGE | INTRAVENOUS | Status: DC | PRN
  Administered 2021-01-15: 5 mg via INTRAVENOUS

## 2021-01-15 MED ORDER — ROCURONIUM BROMIDE 100 MG/10ML IV SOLN
INTRAVENOUS | Status: DC | PRN
  Administered 2021-01-15: 10 mg via INTRAVENOUS
  Administered 2021-01-15: 50 mg via INTRAVENOUS
  Administered 2021-01-15: 20 mg via INTRAVENOUS

## 2021-01-15 MED ORDER — ACETAMINOPHEN 650 MG RE SUPP: 650.0000 mg | RECTAL | Status: DC | PRN

## 2021-01-15 MED ORDER — FENTANYL CITRATE (PF) 250 MCG/5ML IJ SOLN
INTRAMUSCULAR | Status: DC | PRN
  Administered 2021-01-15: 50 ug via INTRAVENOUS
  Administered 2021-01-15: 75 ug via INTRAVENOUS
  Administered 2021-01-15: 25 ug via INTRAVENOUS

## 2021-01-15 MED ORDER — PROMETHAZINE HCL 25 MG/ML IJ SOLN: 6.2500 mg | INTRAMUSCULAR | Status: DC | PRN

## 2021-01-15 MED ORDER — DOCUSATE SODIUM 100 MG PO CAPS: 100.0000 mg | ORAL_CAPSULE | Freq: Two times a day (BID) | ORAL | Status: DC

## 2021-01-15 MED ORDER — 0.9 % SODIUM CHLORIDE (POUR BTL) OPTIME
TOPICAL | Status: DC | PRN
  Administered 2021-01-15: 1000 mL

## 2021-01-15 MED ORDER — POLYETHYLENE GLYCOL 3350 17 G PO PACK: 17.0000 g | PACK | Freq: Every day | ORAL | Status: DC | PRN

## 2021-01-15 MED ORDER — CEFAZOLIN SODIUM-DEXTROSE 2-3 GM-%(50ML) IV SOLR
INTRAVENOUS | Status: DC | PRN
  Administered 2021-01-15: 2 g via INTRAVENOUS

## 2021-01-15 MED ORDER — CHLORHEXIDINE GLUCONATE 0.12 % MT SOLN: 15.0000 mL | Freq: Once | OROMUCOSAL | Status: AC

## 2021-01-15 MED ORDER — OXYCODONE HCL 5 MG/5ML PO SOLN
5.0000 mg | Freq: Once | ORAL | Status: DC | PRN
Start: 2021-01-15 — End: 2021-01-15

## 2021-01-15 MED ORDER — ONDANSETRON HCL 4 MG/2ML IJ SOLN: 4.0000 mg | Freq: Four times a day (QID) | INTRAMUSCULAR | Status: DC | PRN

## 2021-01-15 MED ORDER — LIDOCAINE-EPINEPHRINE 1 %-1:100000 IJ SOLN
INTRAMUSCULAR | Status: DC | PRN
  Administered 2021-01-15: 5 mL

## 2021-01-15 MED ORDER — ONDANSETRON HCL 4 MG/2ML IJ SOLN
INTRAMUSCULAR | Status: DC | PRN
  Administered 2021-01-15: 4 mg via INTRAVENOUS

## 2021-01-15 MED ORDER — CEFAZOLIN SODIUM-DEXTROSE 2-4 GM/100ML-% IV SOLN
INTRAVENOUS | Status: AC
  Filled 2021-01-15: qty 100

## 2021-01-15 MED ORDER — PROPOFOL 10 MG/ML IV BOLUS
INTRAVENOUS | Status: DC | PRN
  Administered 2021-01-15: 100 mg via INTRAVENOUS

## 2021-01-15 MED ORDER — METHOCARBAMOL 500 MG PO TABS
500.0000 mg | ORAL_TABLET | Freq: Four times a day (QID) | ORAL | Status: DC | PRN
  Administered 2021-01-15 – 2021-01-20 (×3): 500 mg via ORAL
  Filled 2021-01-15 (×3): qty 1

## 2021-01-15 MED ORDER — ONDANSETRON HCL 4 MG PO TABS: 4.0000 mg | ORAL_TABLET | Freq: Four times a day (QID) | ORAL | Status: DC | PRN

## 2021-01-15 MED ORDER — PHENYLEPHRINE 40 MCG/ML (10ML) SYRINGE FOR IV PUSH (FOR BLOOD PRESSURE SUPPORT)
PREFILLED_SYRINGE | INTRAVENOUS | Status: DC | PRN
  Administered 2021-01-15: 80 ug via INTRAVENOUS
  Administered 2021-01-15: 120 ug via INTRAVENOUS
  Administered 2021-01-15: 80 ug via INTRAVENOUS
  Administered 2021-01-15: 120 ug via INTRAVENOUS

## 2021-01-15 MED ORDER — CHLORHEXIDINE GLUCONATE 0.12 % MT SOLN
OROMUCOSAL | Status: AC
  Administered 2021-01-15: 15 mL via OROMUCOSAL
  Filled 2021-01-15: qty 15

## 2021-01-15 MED ORDER — LACTATED RINGERS IV SOLN: INTRAVENOUS | Status: DC

## 2021-01-15 MED ORDER — SODIUM CHLORIDE 0.9% FLUSH
3.0000 mL | Freq: Two times a day (BID) | INTRAVENOUS | Status: DC
  Administered 2021-01-15 – 2021-01-22 (×14): 3 mL via INTRAVENOUS

## 2021-01-15 MED ORDER — LIDOCAINE-EPINEPHRINE 1 %-1:100000 IJ SOLN
INTRAMUSCULAR | Status: AC
  Filled 2021-01-15: qty 1

## 2021-01-15 MED ORDER — BUPIVACAINE HCL (PF) 0.5 % IJ SOLN
INTRAMUSCULAR | Status: DC | PRN
  Administered 2021-01-15: 5 mL

## 2021-01-15 SURGICAL SUPPLY — 48 items
ADH SKN CLS APL DERMABOND .7 (GAUZE/BANDAGES/DRESSINGS) ×1
BAND INSRT 18 STRL LF DISP RB (MISCELLANEOUS)
BAND RUBBER #18 3X1/16 STRL (MISCELLANEOUS) IMPLANT
BLADE CLIPPER SURG (BLADE) IMPLANT
BUR ACORN 6.0 (BURR) IMPLANT
BUR MATCHSTICK NEURO 3.0 LAGG (BURR) ×2 IMPLANT
CANISTER SUCT 3000ML PPV (MISCELLANEOUS) ×2 IMPLANT
COVER WAND RF STERILE (DRAPES) ×1 IMPLANT
DECANTER SPIKE VIAL GLASS SM (MISCELLANEOUS) ×2 IMPLANT
DERMABOND ADVANCED (GAUZE/BANDAGES/DRESSINGS) ×1
DERMABOND ADVANCED .7 DNX12 (GAUZE/BANDAGES/DRESSINGS) ×1 IMPLANT
DEVICE DISSECT PLASMABLAD 3.0S (MISCELLANEOUS) IMPLANT
DRAPE HALF SHEET 40X57 (DRAPES) IMPLANT
DRAPE LAPAROTOMY T 102X78X121 (DRAPES) ×2 IMPLANT
DRAPE MICROSCOPE LEICA (MISCELLANEOUS) ×1 IMPLANT
DURAPREP 26ML APPLICATOR (WOUND CARE) ×2 IMPLANT
ELECT REM PT RETURN 9FT ADLT (ELECTROSURGICAL) ×2
ELECTRODE REM PT RTRN 9FT ADLT (ELECTROSURGICAL) ×1 IMPLANT
GAUZE 4X4 16PLY RFD (DISPOSABLE) IMPLANT
GAUZE SPONGE 4X4 12PLY STRL (GAUZE/BANDAGES/DRESSINGS) ×2 IMPLANT
GLOVE BIOGEL PI IND STRL 8.5 (GLOVE) ×1 IMPLANT
GLOVE BIOGEL PI INDICATOR 8.5 (GLOVE) ×1
GLOVE ECLIPSE 8.5 STRL (GLOVE) ×2 IMPLANT
GOWN STRL REUS W/ TWL LRG LVL3 (GOWN DISPOSABLE) IMPLANT
GOWN STRL REUS W/ TWL XL LVL3 (GOWN DISPOSABLE) IMPLANT
GOWN STRL REUS W/TWL 2XL LVL3 (GOWN DISPOSABLE) ×2 IMPLANT
GOWN STRL REUS W/TWL LRG LVL3 (GOWN DISPOSABLE)
GOWN STRL REUS W/TWL XL LVL3 (GOWN DISPOSABLE)
HEMOSTAT POWDER KIT SURGIFOAM (HEMOSTASIS) ×2 IMPLANT
KIT BASIN OR (CUSTOM PROCEDURE TRAY) ×2 IMPLANT
KIT TURNOVER KIT B (KITS) ×2 IMPLANT
NEEDLE HYPO 22GX1.5 SAFETY (NEEDLE) ×2 IMPLANT
NEEDLE SPNL 20GX3.5 QUINCKE YW (NEEDLE) IMPLANT
NS IRRIG 1000ML POUR BTL (IV SOLUTION) ×2 IMPLANT
PACK LAMINECTOMY NEURO (CUSTOM PROCEDURE TRAY) ×2 IMPLANT
PAD ARMBOARD 7.5X6 YLW CONV (MISCELLANEOUS) ×6 IMPLANT
PATTIES SURGICAL .5 X1 (DISPOSABLE) ×2 IMPLANT
PLASMABLADE 3.0S (MISCELLANEOUS)
SPONGE SURGIFOAM ABS GEL SZ50 (HEMOSTASIS) ×1 IMPLANT
SUT VIC AB 1 CT1 18XBRD ANBCTR (SUTURE) ×1 IMPLANT
SUT VIC AB 1 CT1 8-18 (SUTURE) ×2
SUT VIC AB 2-0 CP2 18 (SUTURE) ×2 IMPLANT
SUT VIC AB 3-0 SH 8-18 (SUTURE) ×2 IMPLANT
SUT VIC AB 4-0 RB1 18 (SUTURE) ×2 IMPLANT
TOWEL GREEN STERILE (TOWEL DISPOSABLE) ×2 IMPLANT
TOWEL GREEN STERILE FF (TOWEL DISPOSABLE) ×2 IMPLANT
TRAY FOLEY MTR SLVR 16FR STAT (SET/KITS/TRAYS/PACK) ×2 IMPLANT
WATER STERILE IRR 1000ML POUR (IV SOLUTION) ×2 IMPLANT

## 2021-01-15 NOTE — Plan of Care (Signed)
  Problem: Education: Goal: Knowledge of General Education information will improve Description: Including pain rating scale, medication(s)/side effects and non-pharmacologic comfort measures Outcome: Progressing   Problem: Clinical Measurements: Goal: Will remain free from infection Outcome: Progressing   Problem: Clinical Measurements: Goal: Respiratory complications will improve Outcome: Progressing   Problem: Activity: Goal: Risk for activity intolerance will decrease Outcome: Progressing   Problem: Nutrition: Goal: Adequate nutrition will be maintained Outcome: Progressing   Problem: Pain Managment: Goal: General experience of comfort will improve Outcome: Progressing   Problem: Safety: Goal: Ability to remain free from injury will improve Outcome: Progressing   Problem: Skin Integrity: Goal: Risk for impaired skin integrity will decrease Outcome: Progressing

## 2021-01-15 NOTE — Progress Notes (Signed)
Hand off report called to short stay RN, Judson Roch. Informed of elevated BP, manuel BP check reading is 158/98 using left arm. CHG bath performed prior to transport to surgery.

## 2021-01-15 NOTE — Progress Notes (Signed)
Patient ID: Morgan Bell, female   DOB: [birth year], 70 y.o.   MRN: [id number] Vital signs are stable Patient's legs still remain strong She is for discectomy at T8 today using Metrix technique.

## 2021-01-15 NOTE — Progress Notes (Signed)
Elevated BP, MD made aware, no PRN meds ordered at this time.

## 2021-01-15 NOTE — Op Note (Signed)
Date of surgery: 01/15/2021 Preoperative diagnosis: Herniated nucleus pulposus with spinal cord compression at T8 Postoperative diagnosis: Same Procedure: Left far lateral discectomy via transpedicular approach at T8 using Metrix retractor operating microscope microdissection technique Surgeon: Ignacio Assistant: Elwin Sleight, DO Anesthesia: General endotracheal Indications: Morgan Bell is a 70 year old individual who has had significant back and leg pain and a recent falls that included a fracture of her right metatarsal.  She had a fall this past weekend and came to the hospital complaining of severe back pain and MRI of the thoracic spine demonstrates the presence of a large disc herniation in the thoracic spine at the level of T8.  This is at a vertebrae where she has had previous compression fracture and acrylic balloon kyphoplasty.  She was advised regarding the need for surgical decompression.  This is to be done via a far lateral approach using Metrix retractor.  Procedure: Patient was brought to the operating room supine on the stretcher.  After the smooth induction of general endotracheal anesthesia a Foley catheter was placed.  She was turned prone onto the operating table.  Her back was prepped with alcohol DuraPrep and draped in a sterile fashion in the thoracic region.  Then using fluoroscopic imaging we localized the T8 vertebrae and particularly the left lateral far lateral approach approximately 6 cm off the midline.  The area was marked and infiltrated with lidocaine with epinephrine mixed 50-50 with half percent Marcaine and a small vertical incision measuring an inch in size was created in this region.  A K wire was passed down to the lateral aspect of the lamina at the transverse process junction.  A series of dilators were passed over the wire and gradually the area was dilated out to allow placement of an 18 mm wide Metrix retractor tube.  The tube was then secured to the  operating table with a clamp and the inner tubes were removed and the dissection of the soft tissues was undertaken.  The bone that was identified was ultimately identified as laminar arch and part of the laminar arch was removed to identify the underlying dura.  The dissection was carried lateral to exposed pedicle and then a panniculectomy was performed for the T8 vertebrae.  As the pedicle was taken down the lateral aspect of the dura was exposed more and in this region then we were able to identify the lateral aspect of the dura in the epidural space and also the T8 nerve root.  By dissecting underneath the nerve root we entered a mass that was a considerable amount of disc material and there is a number of free fragments of disc that were removed from this region.  Further dissection yielded other fragments of disc which were gradually removed both by Dr. Reatha Armour and myself each inspecting the others working being careful to irrigate and provide suction cautery as necessary.  The procedure continued from both above and below the T8 nerve root until no other disc material could be retrieved from this method.  The dissection was carried cephalad a bit to dissect more in the epidural space no other disc material was identified there in the far lateral aspect or inferiorly.  Once this was accomplished hemostasis was established and then the microscope was removed the retractor was removed 20 cc of half percent Marcaine was injected into the paraspinous fascia and then the fascia was closed with 3-0 Vicryl in interrupted fashion and 3-0 Vicryl was used in a subcuticular tissue and  Dermabond was placed on the skin.  Blood loss for the procedure was estimated at less than 50 cc.

## 2021-01-15 NOTE — Anesthesia Preprocedure Evaluation (Addendum)
Anesthesia Evaluation  Patient identified by MRN, date of birth, ID band Patient awake    Reviewed: Allergy & Precautions, Patient's Chart, lab work & pertinent test results  History of Anesthesia Complications Negative for: history of anesthetic complications  Airway Mallampati: II  TM Distance: >3 FB Neck ROM: Full    Dental  (+) Teeth Intact   Pulmonary neg pulmonary ROS,    Pulmonary exam normal        Cardiovascular hypertension, Pt. on medications  Rhythm:Regular Rate:Normal  TTE 05/2020: EF 55-60%, mild LVH, grade I DD, mild AS   Neuro/Psych  Headaches, Thoracic herniated disc TIACVA negative psych ROS   GI/Hepatic negative GI ROS, Neg liver ROS,   Endo/Other  negative endocrine ROS  Renal/GU negative Renal ROS  negative genitourinary   Musculoskeletal  (+) Arthritis ,   Abdominal (+)  Abdomen: soft. Bowel sounds: normal.  Peds  Hematology negative hematology ROS (+)   Anesthesia Other Findings Day of surgery medications reviewed with patient.  Reproductive/Obstetrics negative OB ROS                            Anesthesia Physical Anesthesia Plan  ASA: II  Anesthesia Plan: General   Post-op Pain Management:    Induction: Intravenous  PONV Risk Score and Plan: 3 and Treatment may vary due to age or medical condition, Ondansetron, Dexamethasone and Midazolam  Airway Management Planned: Oral ETT  Additional Equipment: None  Intra-op Plan:   Post-operative Plan: Extubation in OR  Informed Consent: I have reviewed the patients History and Physical, chart, labs and discussed the procedure including the risks, benefits and alternatives for the proposed anesthesia with the patient or authorized representative who has indicated his/her understanding and acceptance.     Dental advisory given  Plan Discussed with:   Anesthesia Plan Comments:        Anesthesia Quick  Evaluation

## 2021-01-15 NOTE — Progress Notes (Signed)
Patient returned to 3W-room 37, vital signs stable on 2L [place], A&O x 4, reports right sided mid back pain 4/10, medication given, see MAR.

## 2021-01-15 NOTE — Anesthesia Procedure Notes (Signed)
Procedure Name: Intubation Date/Time: 01/15/2021 3:26 PM Performed by: Janene Harvey, CRNA Pre-anesthesia Checklist: Patient identified, Emergency Drugs available, Suction available and Patient being monitored Patient Re-evaluated:Patient Re-evaluated prior to induction Oxygen Delivery Method: Circle system utilized Preoxygenation: Pre-oxygenation with 100% oxygen Induction Type: IV induction Ventilation: Mask ventilation without difficulty and Oral airway inserted - appropriate to patient size Laryngoscope Size: Mac and 4 Grade View: Grade I Tube type: Oral Tube size: 7.0 mm Number of attempts: 1 Airway Equipment and Method: Stylet and Oral airway Placement Confirmation: ETT inserted through vocal cords under direct vision,  positive ETCO2 and breath sounds checked- equal and bilateral Secured at: 22 cm Tube secured with: Tape Dental Injury: Teeth and Oropharynx as per pre-operative assessment

## 2021-01-15 NOTE — Transfer of Care (Signed)
Immediate Anesthesia Transfer of Care Note  Patient: Morgan Bell  Procedure(s) Performed: Thoracic Seven-Eight Laminectomy with transpedicular discectomy (N/A Back)  Patient Location: PACU  Anesthesia Type:General  Level of Consciousness: awake and alert   Airway & Oxygen Therapy: Patient Spontanous Breathing and Patient connected to face mask oxygen  Post-op Assessment: Report given to RN, Post -op Vital signs reviewed and stable and Patient moving all extremities X 4  Post vital signs: Reviewed and stable  Last Vitals:  Vitals Value Taken Time  BP 161/83 01/15/21 1753  Temp    Pulse 84 01/15/21 1756  Resp 14 01/15/21 1756  SpO2 93 % 01/15/21 1756  Vitals shown include unvalidated device data.  Last Pain:  Vitals:   01/15/21 1343  TempSrc:   PainSc: 2       Patients Stated Pain Goal: 0 (32/76/14 7092)  Complications: No complications documented.

## 2021-01-16 ENCOUNTER — Encounter (HOSPITAL_COMMUNITY): Payer: Self-pay | Admitting: Neurological Surgery

## 2021-01-16 MED ORDER — GERHARDT'S BUTT CREAM
TOPICAL_CREAM | Freq: Two times a day (BID) | CUTANEOUS | Status: DC
  Administered 2021-01-20: 1 via TOPICAL
  Filled 2021-01-16: qty 1

## 2021-01-16 NOTE — Consult Note (Signed)
Reason for Consult:Right 5th MT fx Referring Physician: Koleen Distance Time called: 2423 Time at bedside: [place] is an 70 y.o. female.  HPI: Morgan Bell has had a recent hx/o falls beginning about 10d on Saturday. When Morgan Bell presented to the ED on 4/2 Morgan Bell was apparently c/o right foot pain though pt denies this and it is not well documented in the chart. In any event x-rays showed a 5th MT fx and a possible 5th prox phalanx fx and orthopedic surgery was consulted. Morgan Bell underwent laminectomy for her BLE weakness yesterday by Dr. Ellene Route. Morgan Bell denies and foot or ankle pain this morning.  Past Medical History:  Diagnosis Date  . Arthritis    RA  . Exertional angina ([place]) 07/16/2018   CT coronary 09/13/18: Coronary Ca score 62.1, 68% for age/sex matched control, No evidence of obstructive CAD (1-24% LAD, RCA)  . Family history of adverse reaction to anesthesia    son has difficulty waking   . Headache(784.0)    HX MIGRAINES   . Hypertension   . Melanoma ([place])   . Obesity   . Stroke [facility])    MINI STROKE     21 YRS AGO     Past Surgical History:  Procedure Laterality Date  . APPLICATION OF ROBOTIC ASSISTANCE FOR SPINAL PROCEDURE N/A 10/18/2019   Procedure: APPLICATION OF ROBOTIC ASSISTANCE FOR SPINAL PROCEDURE;  Surgeon: Kristeen Miss, MD;  Location: [facility];  Service: Neurosurgery;  Laterality: N/A;  . CESAREAN SECTION  11/26/1973  . CESAREAN SECTION  09/22/1975  . CESAREAN SECTION  04/30/1978  . HEMORRHOID SURGERY  06/1987  . LUMBAR LAMINECTOMY/DECOMPRESSION MICRODISCECTOMY N/A 01/15/2021   Procedure: Thoracic Seven-Eight Laminectomy with transpedicular discectomy;  Surgeon: Kristeen Miss, MD;  Location: [facility];  Service: Neurosurgery;  Laterality: N/A;  Thoracic Seven-Eight Laminectomy with transpedicular discectomy  . MELANOMA EXCISION WITH SENTINEL LYMPH NODE BIOPSY Right 03/02/2013   Procedure: Wide MELANOMA EXCISION Right Abdominal wall WITH SENTINEL LYMPH NODE mapping Right Axilla;   Surgeon: Merrie Roof, MD;  Location: [facility];  Service: General;  Laterality: Right;  . NM [place]  2003   negative Bruce protocol exercise stress test with no evidence of perfusion abnormality, EF 66%  . TOTAL HIP ARTHROPLASTY Right 03/2011    Family History  Problem Relation Age of Onset  . Alzheimer's disease Mother   . Heart disease Father        MI, died at 9  . Alzheimer's disease Maternal Grandmother   . Cancer Maternal Grandfather        lung  . Heart attack Paternal Grandfather   . Tuberculosis Sister     Social History:  reports that Morgan Bell has never smoked. Morgan Bell has never used smokeless tobacco. Morgan Bell reports that Morgan Bell does not drink alcohol and does not use drugs.  Allergies:  Allergies  Allergen Reactions  . Sulfa Antibiotics Rash    SULFA DRUGS    Medications: I have reviewed the patient's current medications.  No results found for this or any previous visit (from the past 48 hour(s)).  DG Thoracic Spine 2 View  Result Date: 01/15/2021 CLINICAL DATA:  T7-8 laminectomy. EXAM: THORACIC SPINE 2 VIEWS; DG C-ARM 1-60 MIN COMPARISON:  MRI 01/12/2021 FINDINGS: Two intraoperative spot images demonstrate localizing instrument directed toward the superior most vertebral body from prior vertebroplasty. By prior MRI, this is the T8 level. IMPRESSION: Intraoperative localization as above. Electronically Signed   By: Rolm Baptise M.D.  On: 01/15/2021 18:03   DG C-Arm 1-60 Min  Result Date: 01/15/2021 CLINICAL DATA:  T7-8 laminectomy. EXAM: THORACIC SPINE 2 VIEWS; DG C-ARM 1-60 MIN COMPARISON:  MRI 01/12/2021 FINDINGS: Two intraoperative spot images demonstrate localizing instrument directed toward the superior most vertebral body from prior vertebroplasty. By prior MRI, this is the T8 level. IMPRESSION: Intraoperative localization as above. Electronically Signed   By: Rolm Baptise M.D.   On: 01/15/2021 18:03    Review of Systems  HENT: Negative for ear discharge,  ear pain, hearing loss and tinnitus.   Eyes: Negative for photophobia and pain.  Respiratory: Negative for cough and shortness of breath.   Cardiovascular: Negative for chest pain.  Gastrointestinal: Negative for abdominal pain, nausea and vomiting.  Genitourinary: Negative for dysuria, flank pain, frequency and urgency.  Musculoskeletal: Positive for back pain. Negative for arthralgias, myalgias and neck pain.  Neurological: Negative for dizziness and headaches.  Hematological: Does not bruise/bleed easily.  Psychiatric/Behavioral: The patient is not nervous/anxious.    Blood pressure (!) 145/95, pulse 90, temperature (!) 97.5 F (36.4 C), temperature source Oral, resp. rate 18, height 5' (1.524 m), weight 60.2 kg, SpO2 96 %. Physical Exam Constitutional:      General: Morgan Bell is not in acute distress.    Appearance: Morgan Bell is well-developed. Morgan Bell is not diaphoretic.  HENT:     Head: Normocephalic and atraumatic.  Eyes:     General: No scleral icterus.       Right eye: No discharge.        Left eye: No discharge.     Conjunctiva/sclera: Conjunctivae normal.  Cardiovascular:     Rate and Rhythm: Normal rate and regular rhythm.  Pulmonary:     Effort: Pulmonary effort is normal. No respiratory distress.  Musculoskeletal:     Cervical back: Normal range of motion.     Comments: RLE No traumatic wounds, ecchymosis, or rash  Short leg splint in place, no TTP lateral foot or ankle.  No knee or ankle effusion  Knee stable to varus/ valgus and anterior/posterior stress  Sens DPN, SPN, TN intact  Motor EHL, ext, flex, evers 5/5  DP 1+, PT 0, No significant edema  Skin:    General: Skin is warm and dry.  Neurological:     Mental Status: Morgan Bell is alert.  Psychiatric:        Behavior: Behavior normal.     Assessment/Plan: Right 5th MT fx -- I'm not sure if this is old or if Morgan Bell's having trouble feeling it. In any event, Morgan Bell may WBAT in a post-op shoe. F/u with Dr. Erlinda Hong in 2 weeks for  f/u.    Lisette Abu, PA-C Orthopedic Surgery [phone] 01/16/2021, 10:30 AM

## 2021-01-16 NOTE — Consult Note (Signed)
[facility] Nurse Consult Note: Patient is lyingin bed. She is able to turn from her back to her right side with minimal assist from me.  Bed mobility appears satisfactory.  I have encouraged her to turn and reposition at least every two hours.  She agrees.  Reason for Consult:Nonblanchable erythema noted yesterday during surgery.  Today, sacrum is red and nonblanchable.  No redness noted along spine. Patient is incontinent and in a brief.  Would recommend Purewick and no disposable briefs or underpads while in bed.  Brief is fine when up and ambulating.  Wound type:pressure and moisture.  Pressure Injury POA: No Measurement: sacrococcygeal region:  4 cm x 3 cm nonblanchable erythema. Area frequently moist  Wound [id number] Drainage (amount, consistency, odor) none Periwound:moist Dressing procedure/placement/frequency:Gerhardts butt paste twice daily to sacrum and buttocks.  Will not follow at this time.  Please re-consult if needed.  Domenic Moras MSN, RN, FNP-BC CWON Wound, Ostomy, Continence Nurse Pager [phone]

## 2021-01-16 NOTE — Progress Notes (Signed)
Orthopedic Tech Progress Note Patient Details:  Morgan Bell [birth year] [id number] Left at bedside Ortho Devices Type of Ortho Device: Postop shoe/boot Ortho Device/Splint Location: RLE Ortho Device/Splint Interventions: Ordered   Post Interventions Patient Tolerated: Well Instructions Provided: Care of device,Adjustment of device   Morgan Bell 01/16/2021, 12:45 PM

## 2021-01-16 NOTE — Anesthesia Postprocedure Evaluation (Signed)
Anesthesia Post Note  Patient: Morgan Bell  Procedure(s) Performed: Thoracic Seven-Eight Laminectomy with transpedicular discectomy (N/A Back)     Patient location during evaluation: PACU Anesthesia Type: General Level of consciousness: awake and alert Pain management: pain level controlled Vital Signs Assessment: post-procedure vital signs reviewed and stable Respiratory status: spontaneous breathing, nonlabored ventilation, respiratory function stable and patient connected to nasal cannula oxygen Cardiovascular status: blood pressure returned to baseline and stable Postop Assessment: no apparent nausea or vomiting Anesthetic complications: no   No complications documented.  Last Vitals:  Vitals:   01/15/21 2319 01/16/21 0324  BP: (!) 143/92 (!) 144/82  Pulse: 86 89  Resp: 18 18  Temp: 36.5 C 36.8 C  SpO2: 95% 97%    Last Pain:  Vitals:   01/16/21 0324  TempSrc: Oral  PainSc: Asleep                 [name]

## 2021-01-16 NOTE — Progress Notes (Signed)
Physical Therapy Treatment Patient Details Name: Morgan Bell MRN: [id number] DOB: May 30, 1951 Today's Date: 01/16/2021    History of Present Illness Pt is a 70 y.o. female who presented 4/2 after sustaining a fall 3/26 in which her legs have progressively gotten weaker and she has fallen several times a day since then. 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  This is at a vertebrae where she has had previous compression fracture and acrylic balloon kyphoplasty. S/p lateral L T8 discectomy and decompression 4/5. Imaging revealed fxs of R 5th distal and midportion metatarsal and possible subtle fx through R 5th proximal phalanx. PMH: CVA, obese, melanoma, HTN, exertional angina, arthritis, and osteoporosis.    PT Comments    Returned with OT to address pt's safety and independence with transfers. Able to come to full stand this time with bil knees blocked, bil UE support on therapists, and maxAx2 to facilitate knee and hip extension. Progressed to only needing modAx2 for static standing balance but up to maxAx2 to take steps laterally to the R to transfer to chair. Pt demonstrating poor motor planning, strength, and balance. Will continue to follow acutely. Current recommendations remain appropriate.    Follow Up Recommendations  CIR     Equipment Recommendations  None recommended by PT    Recommendations for Other Services Rehab consult     Precautions / Restrictions Precautions Precautions: Fall;Back Precaution Booklet Issued: Yes (comment) Precaution Comments: Reviewed BLT; R post-op shoe Required Braces or Orthoses:  (orders for no brace needed and re-confirmed verbally, but TLSO present in room) Spinal Brace:  (no brace needed) Restrictions Weight Bearing Restrictions: No Other Position/Activity Restrictions: Verbal orders for WBAT R leg and R post-op shoe    Mobility  Bed Mobility Overal bed  mobility: Needs Assistance Bed Mobility: Rolling;Sidelying to Sit Rolling: Min guard Sidelying to sit: Min assist       General bed mobility comments: Cued pt to maintain spinal precautions via log roll to L using bed rail, min guard. Pt quick to fling legs off EOB even though cued to lift trunk simultaneously, minA to complete.    Transfers Overall transfer level: Needs assistance Equipment used: 2 person hand held assist Transfers: Sit to/from Stand Sit to Stand: +2 physical assistance;+2 safety/equipment;Max assist         General transfer comment: Bil knees blocked and bil UE support on therapists, cuing pt to scoot anteriorly and push up to stand. MaxAx2 initially to facilitate knee and hip extension, progressed to modAx2 for static standing once fully upright.  Ambulation/Gait Ambulation/Gait assistance: Max assist;+2 physical assistance;+2 safety/equipment Gait Distance (Feet): 3 Feet Assistive device: 2 person hand held assist Gait Pattern/deviations: Decreased stride length;Decreased step length - left;Decreased step length - right;Decreased weight shift to right;Decreased weight shift to left;Shuffle Gait velocity: reduced Gait velocity interpretation: <1.31 ft/sec, indicative of household ambulator General Gait Details: Pt with bil UE support on therapists and bil knees blocked during either stance phase, needing cues to shift weight and advance legs. MaxAx2 for stability and coordinating leg movement to step towards R to transfer bed > chair.   Stairs             Wheelchair Mobility    Modified Rankin (Stroke Patients Only)       Balance Overall balance assessment: Needs assistance Sitting-balance support: No upper extremity supported Sitting balance-Leahy Scale: Fair Sitting balance - Comments: Able  to sit statically without UE support but needs UE support for dynamic tasks.   Standing balance support: Bilateral upper extremity supported Standing  balance-Leahy Scale: Poor Standing balance comment: When standing statically with bil knees blocked pt needing modAx2. When trying to take steps pt needs maxAx2 for stability with bil UEs supported.                            Cognition Arousal/Alertness: Awake/alert Behavior During Therapy: WFL for tasks assessed/performed Overall Cognitive Status: Within Functional Limits for tasks assessed                                 General Comments: A&Ox4. Pt can be imulsive and need cues to sequence tasks to maintain spinal precautions at times, but this may be her baseline.      Exercises      General Comments       Pertinent Vitals/Pain Pain Assessment: No/denies pain    Home Living Family/patient expects to be discharged to:: Private residence Living Arrangements: Alone Available Help at Discharge: Family;Available PRN/intermittently Type of Home: Mobile home Home Access: Stairs to enter Entrance Stairs-Rails: Can reach both Home Layout: One level Home Equipment: Toilet riser;Walker - 2 wheels;Bedside commode;Tub bench;Adaptive equipment;Cane - quad;Cane - single point Additional Comments: Per pt, her son lives down the road.    Prior Function Level of Independence: Independent with assistive device(s)      Comments: Pt uses RW within home and quad-cane outside. Pt drives.   PT Goals (current goals can now be found in the care plan section) Acute Rehab PT Goals Patient Stated Goal: to improve PT Goal Formulation: With patient Time For Goal Achievement: 01/30/21 Potential to Achieve Goals: Good Progress towards PT goals: Progressing toward goals    Frequency    Min 5X/week      PT Plan Current plan remains appropriate    Co-evaluation PT/OT/SLP Co-Evaluation/Treatment: Yes Reason for Co-Treatment: For patient/therapist safety;To address functional/ADL transfers PT goals addressed during session: Mobility/safety with mobility;Balance         AM-PAC PT "6 Clicks" Mobility   Outcome Measure  Help needed turning from your back to your side while in a flat bed without using bedrails?: A Little Help needed moving from lying on your back to sitting on the side of a flat bed without using bedrails?: A Little Help needed moving to and from a bed to a chair (including a wheelchair)?: A Lot Help needed standing up from a chair using your arms (e.g., wheelchair or bedside chair)?: A Lot Help needed to walk in hospital room?: A Lot Help needed climbing 3-5 steps with a railing? : Total 6 Click Score: 13    End of Session Equipment Utilized During Treatment: Gait belt;Oxygen Activity Tolerance: Patient tolerated treatment well Patient left: with call bell/phone within reach;in chair;with chair alarm set Nurse Communication: Mobility status;Need for lift equipment (use stedy for return to bed) PT Visit Diagnosis: Repeated falls (R29.6);Muscle weakness (generalized) (M62.81);History of falling (Z91.81);Difficulty in walking, not elsewhere classified (R26.2);Unsteadiness on feet (R26.81);Other abnormalities of gait and mobility (R26.89)     Time: 1200-1210 PT Time Calculation (min) (ACUTE ONLY): 10 min  Charges:  $Therapeutic Activity: 8-22 mins                     Moishe Spice, PT, DPT Acute Rehabilitation Services  Pager:  [phone] Office: [place] 01/16/2021, 2:00 PM

## 2021-01-16 NOTE — Progress Notes (Signed)
Physical Therapy Evaluation Patient Details Name: Morgan Bell MRN: [id number] DOB: 1951-04-07 Today's Date: 01/16/2021   History of Present Illness  Pt is a 70 y.o. female who presented 4/2 after sustaining a fall 3/26 in which her legs have progressively gotten weaker and she has fallen several times a day since then. 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  This is at a vertebrae where she has had previous compression fracture and acrylic balloon kyphoplasty. S/p lateral L T8 discectomy and decompression 4/5. Imaging revealed fxs of R 5th distal and midportion metatarsal and possible subtle fx through R 5th proximal phalanx. PMH: CVA, obese, melanoma, HTN, exertional angina, arthritis, and osteoporosis.  Clinical Impression  Pt presents with condition above and deficits mentioned below, see PT Problem List. PTA, she was living in a mobile home with 6 STE alone with her son living down the road. Pt was mod I using a quad-cane outside but a RW within her home. Currently, pt demonstrates significant bilateral lower extremity weakness and incoordination that places her at high risk for further falls. While she demonstrated good strength in her legs via MMT scores of 4-5 in bil legs grossly prior to trying to stand she displayed incoordination and weakness in activating her muscles when performing functional mobility. This resulted in her needing TA to be returned safely to the bed as she was unable to come to stand this date. As her current level of function is significantly less than her PLOF recommending intensive therapies in the CIR setting to maximize her independence and safety with all functional mobility prior to return home. Will continue to follow acutely.     Follow Up Recommendations CIR    Equipment Recommendations  None recommended by PT    Recommendations for Other Services Rehab consult      Precautions / Restrictions Precautions Precautions: Fall;Back Precaution Booklet Issued: Yes (comment) Precaution Comments: Reviewed BLT; R post-op shoe Required Braces or Orthoses:  (orders for no brace needed and re-confirmed verbally, but TLSO present in room) Spinal Brace:  (no brace needed) Restrictions Weight Bearing Restrictions: No Other Position/Activity Restrictions: Verbal orders for WBAT R leg and R post-op shoe      Mobility  Bed Mobility Overal bed mobility: Needs Assistance Bed Mobility: Rolling;Sidelying to Sit Rolling: Min guard Sidelying to sit: Min assist       General bed mobility comments: Cued pt to maintain spinal precautions via log roll to L using bed rail, min guard. Pt quick to fling legs off EOB even though cued to lift trunk simultaneously, minA to complete.    Transfers Overall transfer level: Needs assistance Equipment used: Rolling walker (2 wheeled) Transfers: Sit to/from Stand Sit to Stand: Total assist         General transfer comment: Pt attempting to come to stand after being cued to scoot anteriorly and push off bed with at least 1 UE for safety. However, min-no activation noted through pt's legs resulting in her beginning to slide down off the bed. TA to prevent fall and return pt to bed. Knees did not touch ground which pt confirmed. Pt confirmed no pain following transfer attempt.  Ambulation/Gait             General Gait Details: NT due to unsafe without +2  Stairs            Wheelchair Mobility    Modified Rankin (  Stroke Patients Only)       Balance Overall balance assessment: Needs assistance Sitting-balance support: No upper extremity supported Sitting balance-Leahy Scale: Fair Sitting balance - Comments: Able to sit statically without UE support but needs UE support for dynamic tasks.   Standing balance support: Bilateral upper extremity supported Standing balance-Leahy Scale: Zero Standing balance  comment: TA to return pt safely to bed as she was unable to hold herself up.                             Pertinent Vitals/Pain Pain Assessment: No/denies pain    Home Living Family/patient expects to be discharged to:: Private residence Living Arrangements: Alone Available Help at Discharge: Family;Available PRN/intermittently Type of Home: Mobile home Home Access: Stairs to enter Entrance Stairs-Rails: Can reach both Entrance Stairs-Number of Steps: 6 Home Layout: One level Home Equipment: Toilet riser;Walker - 2 wheels;Bedside commode;Tub bench;Adaptive equipment;Cane - quad;Cane - single point Additional Comments: Per pt, her son lives down the road.    Prior Function Level of Independence: Independent with assistive device(s)         Comments: Pt uses RW within home and quad-cane outside. Pt drives.     Hand Dominance   Dominant Hand: Right (August 2st, 2021 pt had hemorrhagic stroke affecting R side so now does things with L hand, per pt)    Extremity/Trunk Assessment   Upper Extremity Assessment Upper Extremity Assessment: Defer to OT evaluation    Lower Extremity Assessment Lower Extremity Assessment: Generalized weakness (MMT scores of 4 to 5 grossly in bil legs, tested prior to attempt at transfers; denies numbness/tingling; gross incoordination with functional mobility noted)    Cervical / Trunk Assessment Cervical / Trunk Assessment: Kyphotic;Other exceptions Cervical / Trunk Exceptions: spinal procedure  Communication   Communication: No difficulties  Cognition Arousal/Alertness: Awake/alert Behavior During Therapy: WFL for tasks assessed/performed Overall Cognitive Status: Within Functional Limits for tasks assessed                                 General Comments: A&Ox4. Pt can be imulsive and need cues to sequence tasks to maintain spinal precautions at times, but this may be her baseline.      General Comments General  comments (skin integrity, edema, etc.): SpO2 95% on RA at end of session thus kept [place] doffed, RN made aware    Exercises     Assessment/Plan    PT Assessment Patient needs continued PT services  PT Problem List Decreased strength;Decreased range of motion;Decreased activity tolerance;Decreased balance;Decreased mobility;Decreased coordination;Decreased knowledge of use of DME;Decreased safety awareness;Decreased knowledge of precautions;Cardiopulmonary status limiting activity       PT Treatment Interventions DME instruction;Gait training;Stair training;Functional mobility training;Therapeutic activities;Therapeutic exercise;Balance training;Neuromuscular re-education;Patient/family education    PT Goals (Current goals can be found in the Care Plan section)  Acute Rehab PT Goals Patient Stated Goal: to improve PT Goal Formulation: With patient Time For Goal Achievement: 01/30/21 Potential to Achieve Goals: Good    Frequency Min 5X/week   Barriers to discharge        Co-evaluation               AM-PAC PT "6 Clicks" Mobility  Outcome Measure Help needed turning from your back to your side while in a flat bed without using bedrails?: A Little Help needed moving from lying on your back to  sitting on the side of a flat bed without using bedrails?: A Little Help needed moving to and from a bed to a chair (including a wheelchair)?: Total Help needed standing up from a chair using your arms (e.g., wheelchair or bedside chair)?: Total Help needed to walk in hospital room?: Total Help needed climbing 3-5 steps with a railing? : Total 6 Click Score: 10    End of Session Equipment Utilized During Treatment: Gait belt;Oxygen Activity Tolerance: Patient tolerated treatment well Patient left: in bed;with call bell/phone within reach;with bed alarm set Nurse Communication: Mobility status;Other (comment) (sats) PT Visit Diagnosis: Unsteadiness on feet (R26.81);Repeated falls  (R29.6);Muscle weakness (generalized) (M62.81);History of falling (Z91.81);Difficulty in walking, not elsewhere classified (R26.2)    Time: 0921-1001 PT Time Calculation (min) (ACUTE ONLY): 40 min   Charges:   PT Evaluation $PT Eval Moderate Complexity: 1 Mod PT Treatments $Therapeutic Activity: 23-37 mins        Moishe Spice, PT, DPT Acute Rehabilitation Services  Pager: [phone] Office: [phone]   Orvan Falconer 01/16/2021, 1:45 PM

## 2021-01-16 NOTE — Evaluation (Signed)
Occupational Therapy Evaluation Patient Details Name: Morgan Bell MRN: [id number] DOB: 1951/10/04 Today's Date: 01/16/2021    History of Present Illness Pt is a 70 y.o. female who presented 4/2 after sustaining a fall 3/26 in which her legs have progressively gotten weaker and she has fallen several times a day since then. 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  This is at a vertebrae where she has had previous compression fracture and acrylic balloon kyphoplasty. S/p lateral L T8 discectomy and decompression 4/5. Imaging revealed fxs of R 5th distal and midportion metatarsal and possible subtle fx through R 5th proximal phalanx. PMH: CVA, obese, melanoma, HTN, exertional angina, arthritis, and osteoporosis.   Clinical Impression   PTA patient independent and driving, reports 1 week history of progressive weakness.  Admitted for above and limited by problem list below, including generalized weakness, impaired balance, decreased activity tolerance and impaired cognition.  She is oriented and follows commands with increased time, but difficulty sequencing multiple step commands and requires increased time for processing- unclear if this is baseline but known hx of CVA.  She currently requires min assist for UB ADLs, max-total assist +2 for LB ADls and max assist +2 for transfers. Believe she will best benefit from CIR level rehab to optimize independence and return to PLOF with ADLs and functional mobility.       Follow Up Recommendations  CIR;Supervision/Assistance - 24 hour    Equipment Recommendations  Other (comment) (TBD)    Recommendations for Other Services Rehab consult     Precautions / Restrictions Precautions Precautions: Fall;Back Precaution Booklet Issued: Yes (comment) Precaution Comments: Reviewed BLT; R post-op shoe Required Braces or Orthoses: Other Brace Spinal Brace:  (no brace needed  orders) Other Brace: post op shoe R LE Restrictions Weight Bearing Restrictions: No Other Position/Activity Restrictions: Verbal orders for WBAT R leg and R post-op shoe      Mobility Bed Mobility Overal bed mobility: Needs Assistance Bed Mobility: Rolling;Sidelying to Sit Rolling: Min guard Sidelying to sit: Min assist       General bed mobility comments: requires cueing for log roll technique and min assist to redirect full log roll prior to transition to legs off bed, min assist for trunk to ascend    Transfers Overall transfer level: Needs assistance Equipment used: 2 person hand held assist Transfers: Sit to/from [facility] Sit to Stand: Max assist;+2 physical assistance;+2 safety/equipment Stand pivot transfers: Max assist;+2 physical assistance;+2 safety/equipment       General transfer comment: max assist +2 with B hand held support to power up and steady, blocking B knees. Pivot to recliner with max assist +2, blocking knees as stepping towards chair with poor coordination noted    Balance Overall balance assessment: Needs assistance Sitting-balance support: No upper extremity supported Sitting balance-Leahy Scale: Fair Sitting balance - Comments: Able to sit statically without UE support but needs UE support for dynamic tasks.   Standing balance support: Bilateral upper extremity supported;During functional activity Standing balance-Leahy Scale: Poor Standing balance comment: relies on BUE and external support                           ADL either performed or assessed with clinical judgement   ADL Overall ADL's : Needs assistance/impaired     Grooming: Set up;Sitting   Upper Body Bathing: Minimal assistance;Sitting   Lower  Body Bathing: Maximal assistance;+2 for physical assistance;+2 for safety/equipment;Sit to/from stand   Upper Body Dressing : Minimal assistance;Sitting   Lower Body Dressing: Total assistance;+2 for  physical assistance;+2 for safety/equipment;Sit to/from stand   Toilet Transfer: Maximal assistance;+2 for physical assistance;+2 for safety/equipment;Stand-pivot Toilet Transfer Details (indicate cue type and reason): simulated to recliner         Functional mobility during ADLs: Maximal assistance;+2 for physical assistance;+2 for safety/equipment General ADL Comments: pt limited by strength, balance, coordination and tolerance     Vision   Vision Assessment?: No apparent visual deficits     Perception     Praxis      Pertinent Vitals/Pain Pain Assessment: No/denies pain     Hand Dominance Right (no brace needed orders)   Extremity/Trunk Assessment Upper Extremity Assessment Upper Extremity Assessment: RUE deficits/detail;Generalized weakness RUE Deficits / Details: defictis from prior CVA with decreased shoulder flexion to 90, decreased GMC/FMC; noted edema in hand and pt reports this is new RUE Coordination: decreased fine motor;decreased gross motor   Lower Extremity Assessment Lower Extremity Assessment: Defer to PT evaluation   Cervical / Trunk Assessment Cervical / Trunk Assessment: Kyphotic;Other exceptions Cervical / Trunk Exceptions: s/p back surgery   Communication Communication Communication: No difficulties   Cognition Arousal/Alertness: Awake/alert Behavior During Therapy: WFL for tasks assessed/performed Overall Cognitive Status: No family/caregiver present to determine baseline cognitive functioning Area of Impairment: Following commands;Awareness;Problem solving                       Following Commands: Follows one step commands consistently;Follows one step commands with increased time;Follows multi-step commands inconsistently   Awareness: Emergent Problem Solving: Slow processing;Requires verbal cues;Difficulty sequencing General Comments: patient oriented and following simple commands, good recall of precautions but requires cueing to  sequence and follow mulitple step commands.  Slow processing at times.  Noted labile during session voicing "I'm sorry for being so difficult".   General Comments  pt on 2L [place], lightheaded at EOB and donned for activity as 90% on RA statically sitting    Exercises     Shoulder Instructions      Home Living Family/patient expects to be discharged to:: Private residence Living Arrangements: Alone Available Help at Discharge: Family;Available PRN/intermittently Type of Home: Mobile home Home Access: Stairs to enter Entrance Stairs-Number of Steps: 6 Entrance Stairs-Rails: Can reach both Home Layout: One level     Bathroom Shower/Tub: Teacher, early years/pre: Handicapped height     Home Equipment: Toilet riser;Walker - 2 wheels;Bedside commode;Tub bench;Adaptive equipment;Cane - quad;Cane - single point Adaptive Equipment: Reacher Additional Comments: Per pt, her son lives down the road.      Prior Functioning/Environment Level of Independence: Independent with assistive device(s)        Comments: Pt uses RW within home and quad-cane outside. Pt drives.        OT Problem List: Decreased strength;Decreased activity tolerance;Impaired balance (sitting and/or standing);Decreased coordination;Decreased knowledge of use of DME or AE;Decreased knowledge of precautions;Increased edema      OT Treatment/Interventions: Self-care/ADL training;DME and/or AE instruction;Therapeutic activities;Patient/family education;Balance training    OT Goals(Current goals can be found in the care plan section) Acute Rehab OT Goals Patient Stated Goal: to improve OT Goal Formulation: With patient Time For Goal Achievement: 01/30/21 Potential to Achieve Goals: Good  OT Frequency: Min 2X/week   Barriers to D/C:            Co-evaluation PT/OT/SLP Co-Evaluation/Treatment: Yes Reason  for Co-Treatment: For patient/therapist safety;To address functional/ADL transfers PT goals  addressed during session: Mobility/safety with mobility;Balance OT goals addressed during session: ADL's and self-care      AM-PAC OT "6 Clicks" Daily Activity     Outcome Measure Help from another person eating meals?: A Little Help from another person taking care of personal grooming?: A Little Help from another person toileting, which includes using toliet, bedpan, or urinal?: Total Help from another person bathing (including washing, rinsing, drying)?: A Lot Help from another person to put on and taking off regular upper body clothing?: A Little Help from another person to put on and taking off regular lower body clothing?: Total 6 Click Score: 13   End of Session Equipment Utilized During Treatment: Gait belt;Oxygen Nurse Communication: Mobility status;Need for lift equipment  Activity Tolerance: Patient tolerated treatment well Patient left: in chair;with call bell/phone within reach;with chair alarm set  OT Visit Diagnosis: Other abnormalities of gait and mobility (R26.89);Muscle weakness (generalized) (M62.81);History of falling (Z91.81)                Time: 1470-9295 OT Time Calculation (min): 25 min Charges:  OT General Charges $OT Visit: 1 Visit OT Evaluation $OT Eval Moderate Complexity: 1 Mod  Jolaine Artist, OT Acute Rehabilitation Services Pager [phone] Office [phone]   Delight Stare 01/16/2021, 3:20 PM

## 2021-01-16 NOTE — Anesthesia Postprocedure Evaluation (Signed)
Anesthesia Post Note  Patient: NEELY KAMMERER  Procedure(s) Performed: Thoracic Seven-Eight Laminectomy with transpedicular discectomy (N/A Back)     Patient location during evaluation: PACU Anesthesia Type: General Level of consciousness: awake and alert Pain management: pain level controlled Vital Signs Assessment: post-procedure vital signs reviewed and stable Respiratory status: spontaneous breathing, nonlabored ventilation, respiratory function stable and patient connected to nasal cannula oxygen Cardiovascular status: blood pressure returned to baseline and stable Postop Assessment: no apparent nausea or vomiting Anesthetic complications: no   No complications documented.  Last Vitals:  Vitals:   01/15/21 2319 01/16/21 0324  BP: (!) 143/92 (!) 144/82  Pulse: 86 89  Resp: 18 18  Temp: 36.5 C 36.8 C  SpO2: 95% 97%    Last Pain:  Vitals:   01/16/21 0324  TempSrc: Oral  PainSc: Asleep                 March Rummage Davis Vannatter

## 2021-01-16 NOTE — Progress Notes (Signed)
Inpatient Rehab Admissions Coordinator:   Consult received.  Note therapy evals pending, will follow for their recommendations.    Shann Medal, PT, DPT Admissions Coordinator [phone] 01/16/21  11:17 AM

## 2021-01-16 NOTE — Progress Notes (Signed)
Patient ID: Morgan Bell, female   DOB: 1951-04-16, 70 y.o.   MRN: [id number] Vital signs are stable Patient feels reasonable with her back and her leg strength appears intact Her right foot has 1/5 metatarsal fracture I have asked orthopedics to see and evaluate to address weightbearing issues and further follow-up care of that I will be out of town for the next few days and I have asked Dr. Pieter Partridge Dawley to follow this lady in my absence.  I believe she would be a good candidate for comprehensive inpatient rehabilitation.  I have placed a consult for this.  We will see how she does with early ambulation.  Also addressed issues of skin redness in the presacral and the thoracolumbar regions noted at the time of surgery yesterday.  Mobilization should help with this but patient's general status preoperatively has been 1 of significant immobility.

## 2021-01-17 NOTE — Progress Notes (Signed)
Physical Therapy Treatment Patient Details Name: Morgan Bell MRN: [id number] DOB: [birth year] Today's Date: 01/17/2021    History of Present Illness Pt is a 70 y.o. female who presented 4/2 after sustaining a fall 3/26 in which her legs have progressively gotten weaker and she has fallen several times a day since then. 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  This is at a vertebrae where she has had previous compression fracture and acrylic balloon kyphoplasty. S/p lateral L T8 discectomy and decompression 4/5. Imaging revealed fxs of R 5th distal and midportion metatarsal and possible subtle fx through R 5th proximal phalanx. PMH: CVA, obese, melanoma, HTN, exertional angina, arthritis, and osteoporosis.    PT Comments    Pt admitted with above diagnosis. Pt was able to sit EOB with min guard assist and cues.  Needs max assist to stand to [facility] and mod to stand once in [place] for trunk and knee instability. Pt practiced sit tostands and was moved to the recliner in the sTedy.   Pt currently with functional limitations due to the deficits listed below (see PT Problem List). Pt will benefit from skilled PT to increase their independence and safety with mobility to allow discharge to the venue listed below.     Follow Up Recommendations  CIR     Equipment Recommendations  None recommended by PT    Recommendations for Other Services Rehab consult     Precautions / Restrictions Precautions Precautions: Fall;Back Precaution Booklet Issued: Yes (comment) Precaution Comments: Reviewed BLT; R post-op shoe Required Braces or Orthoses: Other Brace Spinal Brace:  (no brace needed orders) Other Brace: post op shoe R LE Restrictions Other Position/Activity Restrictions: Verbal orders for WBAT R leg and R post-op shoe    Mobility  Bed Mobility Overal bed mobility: Needs Assistance Bed Mobility: Rolling;Sidelying  to Sit Rolling: Min guard Sidelying to sit: Min assist       General bed mobility comments: requires cueing for log roll technique and min assist to redirect full log roll prior to transition to legs off bed, min assist for trunk to ascend    Transfers Overall transfer level: Needs assistance Equipment used: 2 person hand held assist Transfers: Sit to/from [facility] Sit to Stand: Max assist;+2 physical assistance         General transfer comment: max assist +2 with B hand held support to power up and steady, blocking B knees (left > right). Pivot to recliner with [place].  Practiced 3 sit to stands in [place] with pt able to stand about 30 seconds each time with knee and trunk postural instability.  Ambulation/Gait                 Stairs             Wheelchair Mobility    Modified Rankin (Stroke Patients Only)       Balance Overall balance assessment: Needs assistance Sitting-balance support: No upper extremity supported Sitting balance-Leahy Scale: Fair Sitting balance - Comments: Able to sit statically without UE support but needs UE support for dynamic tasks.   Standing balance support: Bilateral upper extremity supported;During functional activity Standing balance-Leahy Scale: Poor Standing balance comment: relies on BUE and external support                            Cognition Arousal/Alertness: Awake/alert Behavior  During Therapy: WFL for tasks assessed/performed Overall Cognitive Status: No family/caregiver present to determine baseline cognitive functioning Area of Impairment: Following commands;Awareness;Problem solving                       Following Commands: Follows one step commands consistently;Follows one step commands with increased time;Follows multi-step commands inconsistently   Awareness: Emergent Problem Solving: Slow processing;Requires verbal cues;Difficulty sequencing General Comments: patient  oriented and following simple commands, good recall of precautions but requires cueing to sequence and follow mulitple step commands.  Slow processing at times.  Noted labile during session voicing "I'm sorry for being so difficult".      Exercises General Exercises - Lower Extremity Ankle Circles/Pumps: AROM;Both;15 reps;Seated Long Arc Quad: AROM;Both;10 reps;Seated Hip Flexion/Marching: AROM;Both;10 reps;Seated    General Comments General comments (skin integrity, edema, etc.): Removed O2 as pt sats >91% throughout and nurse in agreement      Pertinent Vitals/Pain Pain Assessment: No/denies pain    Home Living                      Prior Function            PT Goals (current goals can now be found in the care plan section) Acute Rehab PT Goals Patient Stated Goal: to improve Progress towards PT goals: Progressing toward goals    Frequency    Min 5X/week      PT Plan Current plan remains appropriate    Co-evaluation              AM-PAC PT "6 Clicks" Mobility   Outcome Measure  Help needed turning from your back to your side while in a flat bed without using bedrails?: A Little Help needed moving from lying on your back to sitting on the side of a flat bed without using bedrails?: A Little Help needed moving to and from a bed to a chair (including a wheelchair)?: Total Help needed standing up from a chair using your arms (e.g., wheelchair or bedside chair)?: Total Help needed to walk in hospital room?: Total Help needed climbing 3-5 steps with a railing? : Total 6 Click Score: 10    End of Session Equipment Utilized During Treatment: Gait belt Activity Tolerance: Patient limited by fatigue Patient left: with call bell/phone within reach;in chair;with chair alarm set Nurse Communication: Mobility status;Need for lift equipment (use stedy for return to bed) PT Visit Diagnosis: Repeated falls (R29.6);Muscle weakness (generalized) (M62.81);History of  falling (Z91.81);Difficulty in walking, not elsewhere classified (R26.2);Unsteadiness on feet (R26.81);Other abnormalities of gait and mobility (R26.89)     Time: 5885-0277 PT Time Calculation (min) (ACUTE ONLY): 18 min  Charges:  $Therapeutic Activity: 8-22 mins                     Birdie Fetty M,PT Acute Rehab Services [phone] (pager)   Alvira Philips 01/17/2021, 2:41 PM

## 2021-01-17 NOTE — TOC CAGE-AID Note (Signed)
Transition of Care [facility]) - CAGE-AID Screening   Patient Details  Name: Morgan Bell MRN: [id number] Date of Birth: [birth year]      Elvina Sidle, RN Trauma Response Nurse Phone Number: 01/17/2021, 1:10 PM       CAGE-AID Screening:    Have You Ever Felt You Ought to Cut Down on Your Drinking or Drug Use?: No Have People Annoyed You By [employer] Your Drinking Or Drug Use?: No Have You Felt Bad Or Guilty About Your Drinking Or Drug Use?: No Have You Ever Had a Drink or Used Drugs First Thing In The Morning to Steady Your Nerves or to Get Rid of a Hangover?: No CAGE-AID Score: 0  Substance Abuse Education Offered: No (Denies any alcohol/drug use)

## 2021-01-17 NOTE — Progress Notes (Signed)
IP rehab admissions - I met with patient at the bedside.  I explained inpatient rehab stay.  Patient lives alone and all 3 of her sons work.  She does not have anyone to help her after a potential rehab stay.  Patient has been to a SNF in the past and she prefers to have her rehab in a SNF due to lack of social support at home.  I have informed the CSW and CM of the above information.  Call me for questions.  [phone]

## 2021-01-17 NOTE — NC FL2 (Signed)
[place] LEVEL OF CARE SCREENING TOOL     IDENTIFICATION  Patient Name: Morgan Bell Birthdate: 26-Apr-1951 Sex: female Admission Date (Current Location): 01/12/2021  [facility] and [place] Number:  Herbalist and Address:  [facility], [place] [address] [place], [place] [address]      Provider Number: [id number]  Attending Physician Name and Address:  Kristeen Miss, MD  Relative Name and Phone Number:       Current Level of Care: Hospital Recommended Level of Care: [place] Prior Approval Number:    Date Approved/Denied:   PASRR Number: [id number]  Discharge Plan: SNF    Current Diagnoses: Patient Active Problem List   Diagnosis Date Noted  . Myelopathy ([place]) 01/13/2021  . Hx of migraines 05/17/2020  . ICH (intracerebral hemorrhage) ([place]) - R cerebellar ICH d/t HTN 05/13/2020  . Lumbar stenosis with neurogenic claudication 10/18/2019  . Exertional angina (HCC) 07/16/2018  . Dyslipidemia 10/24/2016  . TIA (transient ischemic attack) 01/02/2015  . Rheumatoid arthritis ([place]) 01/02/2015  . HTN (hypertension) 04/10/2014  . Melanoma of trunk ([employer]) 01/31/2013    Orientation RESPIRATION BLADDER Height & Weight     Self,Time,Situation,Place  Normal Incontinent,Continent (incontinent at times) Weight: 132 lb 11.5 oz (60.2 kg) Height:  5' (152.4 cm)  BEHAVIORAL SYMPTOMS/MOOD NEUROLOGICAL BOWEL NUTRITION STATUS      Continent Diet (see DC summary)  AMBULATORY STATUS COMMUNICATION OF NEEDS Skin   Extensive Assist Verbally Surgical wounds (closed back incision, liquid skin adhesive)                       Personal Care Assistance Level of Assistance  Bathing,Feeding,Dressing Bathing Assistance: Maximum assistance Feeding assistance: Independent Dressing Assistance: Maximum assistance     Functional Limitations Info             SPECIAL CARE FACTORS FREQUENCY  PT (By licensed PT),OT (By licensed  OT)     PT Frequency: 5x/wk OT Frequency: 5x/wk            Contractures Contractures Info: Not present    Additional Factors Info  Code Status,Allergies Code Status Info: Full Allergies Info: Sulfa Antibiotics           Current Medications (01/17/2021):  This is the current hospital active medication list Current Facility-Administered Medications  Medication Dose Route Frequency Provider Last Rate Last Admin  . 0.9 %  sodium chloride infusion   Intravenous Continuous Kristeen Miss, MD 75 mL/hr at 01/15/21 2051 New Bag at 01/15/21 2051  . 0.9 %  sodium chloride infusion  250 mL Intravenous Continuous Kristeen Miss, MD      . acetaminophen (TYLENOL) tablet 650 mg  650 mg Oral Q4H PRN Kristeen Miss, MD       Or  . acetaminophen (TYLENOL) suppository 650 mg  650 mg Rectal Q4H PRN Kristeen Miss, MD      . alum & mag hydroxide-simeth (MAALOX/MYLANTA) 200-200-20 MG/5ML suspension 30 mL  30 mL Oral Q6H PRN Kristeen Miss, MD      . amLODipine (NORVASC) tablet 5 mg  5 mg Oral BID Kristeen Miss, MD   5 mg at 01/17/21 0911  . aspirin EC tablet 81 mg  81 mg Oral Daily Kristeen Miss, MD   81 mg at 01/17/21 0911  . bisacodyl (DULCOLAX) suppository 10 mg  10 mg Rectal Daily PRN Kristeen Miss, MD      . calcium-vitamin D (OSCAL WITH D) 500-200  MG-UNIT per tablet 1 tablet  1 tablet Oral Daily Kristeen Miss, MD   1 tablet at 01/17/21 0911  . docusate sodium (COLACE) capsule 100 mg  100 mg Oral BID Kristeen Miss, MD   100 mg at 01/17/21 0911  . fenofibrate tablet 54 mg  54 mg Oral Daily Kristeen Miss, MD   54 mg at 01/17/21 0911  . gabapentin (NEURONTIN) capsule 300 mg  300 mg Oral TID Kristeen Miss, MD   300 mg at 01/17/21 0911  . Gerhardt's butt cream   Topical BID Kristeen Miss, MD   Given at 01/17/21 9480  . HYDROcodone-acetaminophen (NORCO/VICODIN) 5-325 MG per tablet 1-2 tablet  1-2 tablet Oral Q4H PRN Kristeen Miss, MD   2 tablet at 01/17/21 0911  . irbesartan (AVAPRO) tablet 300 mg  300 mg  Oral Daily Kristeen Miss, MD   300 mg at 01/17/21 0911  . leflunomide (ARAVA) tablet 20 mg  20 mg Oral Daily Kristeen Miss, MD   20 mg at 01/17/21 0912  . loratadine (CLARITIN) tablet 10 mg  10 mg Oral Daily Kristeen Miss, MD   10 mg at 01/17/21 0911  . menthol-cetylpyridinium (CEPACOL) lozenge 3 mg  1 lozenge Oral PRN Kristeen Miss, MD       Or  . phenol (CHLORASEPTIC) mouth spray 1 spray  1 spray Mouth/Throat PRN Kristeen Miss, MD      . methocarbamol (ROBAXIN) tablet 500 mg  500 mg Oral Q6H PRN Kristeen Miss, MD   500 mg at 01/15/21 2239   Or  . methocarbamol (ROBAXIN) 500 mg in dextrose 5 % 50 mL IVPB  500 mg Intravenous Q6H PRN Kristeen Miss, MD      . morphine 2 MG/ML injection 2 mg  2 mg Intravenous Q2H PRN Kristeen Miss, MD      . ondansetron (ZOFRAN) tablet 4 mg  4 mg Oral Q6H PRN Kristeen Miss, MD       Or  . ondansetron (ZOFRAN) injection 4 mg  4 mg Intravenous Q6H PRN Kristeen Miss, MD      . polyethylene glycol (MIRALAX / GLYCOLAX) packet 17 g  17 g Oral Daily PRN Kristeen Miss, MD      . potassium chloride SA (KLOR-CON) CR tablet 20 mEq  20 mEq Oral BID Kristeen Miss, MD   20 mEq at 01/17/21 0911  . rosuvastatin (CRESTOR) tablet 40 mg  40 mg Oral Daily Kristeen Miss, MD   40 mg at 01/17/21 0911  . senna (SENOKOT) tablet 8.6 mg  1 tablet Oral BID Kristeen Miss, MD   8.6 mg at 01/17/21 0911  . sodium chloride flush (NS) 0.9 % injection 3 mL  3 mL Intravenous Austin Miles, MD   3 mL at 01/17/21 0912  . sodium chloride flush (NS) 0.9 % injection 3 mL  3 mL Intravenous PRN Kristeen Miss, MD      . sodium phosphate (FLEET) 7-19 GM/118ML enema 1 enema  1 enema Rectal Once PRN Kristeen Miss, MD         Discharge Medications: Please see discharge summary for a list of discharge medications.  Relevant Imaging Results:  Relevant Lab Results:   Additional Information SS#: [id number]  Geralynn Ochs, LCSW

## 2021-01-17 NOTE — Progress Notes (Signed)
   Providing Compassionate, Quality Care - Together  NEUROSURGERY PROGRESS NOTE   S: No issues overnight.  O: EXAM:  BP (!) 164/87 (BP Location: Right Arm)   Pulse 93   Temp 98 F (36.7 C) (Oral)   Resp 16   Ht 5' (1.524 m)   Wt 60.2 kg   SpO2 94%   BMI 25.92 kg/m   Awake, alert, oriented  Speech fluent, appropriate  CNs grossly intact  Moves all extremities equally, right lower extremity ankle is casted/Ace wrapped Incision clean dry and intact PERRLA  ASSESSMENT:  70 y.o. female with  status post thoracic discectomy T8  PLAN: -PT/OT, rehab pending -Pain control -Continue supportive care -Ortho Rec WBAT in postop shoe    Thank you for allowing me to participate in this patient's care.  Please do not hesitate to call with questions or concerns.   Elwin Sleight, [facility] Cell: [phone]

## 2021-01-17 NOTE — Care Management Important Message (Signed)
Important Message  Patient Details  Name: Morgan Bell MRN: [id number] Date of Birth: [birth year]   Medicare Important Message Given:  Yes     Jheremy Boger Montine Circle 01/17/2021, 2:55 PM

## 2021-01-18 LAB — BASIC METABOLIC PANEL
Anion gap: 7 (ref 5–15)
BUN: 17 mg/dL (ref 8–23)
CO2: 29 mmol/L (ref 22–32)
Calcium: 9.3 mg/dL (ref 8.9–10.3)
Chloride: 103 mmol/L (ref 98–111)
Creatinine, Ser: 0.88 mg/dL (ref 0.44–1.00)
GFR, Estimated: >60 mL/min (ref >60)
Glucose, Bld: 99 mg/dL (ref 70–99)
Potassium: 4.1 mmol/L (ref 3.5–5.1)
Sodium: 139 mmol/L (ref 135–145)

## 2021-01-18 NOTE — Progress Notes (Signed)
OT note:  OT returned to assist NT as patient reports need to use restroom and requesting bedpan.  Assisted with min assist +2 for safety to stand from recliner, using stedy to transfer to 3:1 commode.  Total assist for toileting.  Patient remains anxious with transfers and requires encouragement due to fear of falling, reports much more comfortable after transfer as she voices she thought she was going to have to go all the way to the bathroom for toileting needs. Pt on commode upon exit, NT to assist when finished. Call bell within reach.    01/18/21 1532  OT Visit Information  Last OT Received On 01/18/21  Assistance Needed +2  History of Present Illness Pt is a 70 y.o. female who presented 4/2 after sustaining a fall 3/26 in which her legs have progressively gotten weaker and she has fallen several times a day since then. 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  This is at a vertebrae where she has had previous compression fracture and acrylic balloon kyphoplasty. S/p lateral L T8 discectomy and decompression 4/5. Imaging revealed fxs of R 5th distal and midportion metatarsal and possible subtle fx through R 5th proximal phalanx. PMH: CVA, obese, melanoma, HTN, exertional angina, arthritis, and osteoporosis.  Precautions  Precautions Fall;Back  Precaution Booklet Issued Yes (comment)  Precaution Comments Reviewed back precautions; R post-op shoe  Required Braces or Orthoses Other Brace  Spinal Brace  (no brace needed)  Other Brace post op shoe R LE  Pain Assessment  Pain Assessment Faces  Faces Pain Scale 4  Pain Location back  Pain Descriptors / Indicators Grimacing;Guarding;Discomfort  Pain Intervention(s) Limited activity within patient's tolerance;Monitored during session;Repositioned  Cognition  Arousal/Alertness Awake/alert  Behavior During Therapy Anxious  Overall Cognitive Status No family/caregiver  present to determine baseline cognitive functioning  Area of Impairment Following commands;Awareness;Problem solving  Following Commands Follows one step commands consistently;Follows one step commands with increased time;Follows multi-step commands inconsistently  Awareness Emergent  Problem Solving Slow processing;Requires verbal cues;Difficulty sequencing  General Comments frequent cueing to maintain back precautions. Follows simple commands but increased time for multiple step commands. Anxious and fearful of falling.  ADL  Overall ADL's  Needs assistance/impaired  Toilet Transfer Minimal assistance;+2 for safety/equipment;+2 for physical assistance  Toilet Transfer Details (indicate cue type and reason) pivot using stedy  Toileting- Clothing Manipulation and Hygiene Total assistance;+2 for physical assistance;+2 for safety/equipment  Functional mobility during ADLs Minimal assistance;+2 for physical assistance;+2 for safety/equipment  Balance  Overall balance assessment Needs assistance  Sitting-balance support No upper extremity supported  Sitting balance-Leahy Scale Fair  Sitting balance - Comments statically with supervision, limited dynamically and cueing for upright posture  Standing balance support Bilateral upper extremity supported;During functional activity  Standing balance-Leahy Scale Poor  Standing balance comment relies on BUE and external support  Transfers  Overall transfer level Needs assistance  Equipment used Ambulation equipment used  Transfers Sit to/from Stand  Sit to Stand Min assist;+2 physical assistance;+2 safety/equipment  General transfer comment sit to stand from recliner using stedy then pivoting to 3:1 commode with +2 for safety, cueing for hand placement and posture  General Comments  General comments (skin integrity, edema, etc.) NT assisted with transfer to commode  OT - End of Session  Equipment Utilized During Treatment Gait belt  Activity  Tolerance Patient tolerated treatment well  Patient left in chair;with call bell/phone within reach;with  chair alarm set  Nurse Communication Mobility status;Precautions;Other (comment) (use stedy for [facility] transfers)  OT Assessment/Plan  OT Plan Frequency remains appropriate;Discharge plan remains appropriate  OT Visit Diagnosis Other abnormalities of gait and mobility (R26.89);Muscle weakness (generalized) (M62.81);History of falling (Z91.81)  OT Frequency (ACUTE ONLY) Min 2X/week  Follow Up Recommendations SNF;Supervision/Assistance - 24 hour  OT Equipment Other (comment) (TBD)  AM-PAC OT "6 Clicks" Daily Activity Outcome Measure (Version 2)  Help from another person eating meals? 3  Help from another person taking care of personal grooming? 3  Help from another person toileting, which includes using toliet, bedpan, or urinal? 1  Help from another person bathing (including washing, rinsing, drying)? 2  Help from another person to put on and taking off regular upper body clothing? 3  Help from another person to put on and taking off regular lower body clothing? 2  6 Click Score 14  OT Goal Progression  Progress towards OT goals Progressing toward goals  Acute Rehab OT Goals  Patient Stated Goal to walk  OT Goal Formulation With patient  OT Time Calculation  OT Start Time (ACUTE ONLY) 1349  OT Stop Time (ACUTE ONLY) 1358  OT Time Calculation (min) 9 min  OT General Charges  $OT Visit 1 Visit  OT Treatments  $Self Care/Home Management  8-22 mins    Jolaine Artist, OT Acute Rehabilitation Services Pager [phone] Office [phone]

## 2021-01-18 NOTE — Plan of Care (Signed)

## 2021-01-18 NOTE — Progress Notes (Signed)
Physical Therapy Treatment Patient Details Name: SHAKTI FLEER MRN: [id number] DOB: 1951-07-11 Today's Date: 01/18/2021    History of Present Illness Pt is a 70 y.o. female who presented 4/2 after sustaining a fall 3/26 in which her legs have progressively gotten weaker and she has fallen several times a day since then. 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  This is at a vertebrae where she has had previous compression fracture and acrylic balloon kyphoplasty. S/p lateral L T8 discectomy and decompression 4/5. Imaging revealed fxs of R 5th distal and midportion metatarsal and possible subtle fx through R 5th proximal phalanx. PMH: CVA, obese, melanoma, HTN, exertional angina, arthritis, and osteoporosis.    PT Comments    Patient progressing towards physical therapy goals. Patient requires minA for bed mobility for trunk elevation and cueing for log roll technique. Patient performed repeated sit to stands in STEDY with minA+2. Patient demos knee hyperextension in standing and forceful knee extension upon standing. Updated discharge recommendation to SNF as patient does not have adequate supervision/assistance at d/c for CIR and prefers SNF for short term rehab .    Follow Up Recommendations  SNF     Equipment Recommendations  None recommended by PT    Recommendations for Other Services       Precautions / Restrictions Precautions Precautions: Fall;Back Precaution Booklet Issued: Yes (comment) Precaution Comments: Reviewed back precautions; R post-op shoe Required Braces or Orthoses: Other Brace Spinal Brace:  (no brace needed) Other Brace: post op shoe R LE Restrictions Weight Bearing Restrictions: Yes Other Position/Activity Restrictions: Verbal orders for WBAT R leg and R post-op shoe    Mobility  Bed Mobility Overal bed mobility: Needs Assistance Bed Mobility: Rolling;Sidelying to  Sit Rolling: Min guard Sidelying to sit: Min assist       General bed mobility comments: Cueing required for log roll technique and minA for trunk elevation    Transfers Overall transfer level: Needs assistance Equipment used: Ambulation equipment used Transfers: Sit to/from Stand Sit to Stand: Min assist;+2 physical assistance;+2 safety/equipment         General transfer comment: multiple sit to stands in STEDY with minA+2. Patient demos knee hyperextension for stability and forceful lock into extension upon standing. Provided resistance behind knee for tactile cueing for controlled extension.  Ambulation/Gait             General Gait Details: deferred   Stairs             Wheelchair Mobility    Modified Rankin (Stroke Patients Only)       Balance Overall balance assessment: Needs assistance Sitting-balance support: No upper extremity supported Sitting balance-Leahy Scale: Fair Sitting balance - Comments: Able to sit statically without UE support but needs UE support for dynamic tasks.   Standing balance support: Bilateral upper extremity supported;During functional activity Standing balance-Leahy Scale: Poor Standing balance comment: relies on BUE and external support                            Cognition Arousal/Alertness: Awake/alert Behavior During Therapy: WFL for tasks assessed/performed Overall Cognitive Status: No family/caregiver present to determine baseline cognitive functioning Area of Impairment: Following commands;Awareness;Problem solving                       Following Commands: Follows one step commands consistently;Follows one  step commands with increased time;Follows multi-step commands inconsistently   Awareness: Emergent Problem Solving: Slow processing;Requires verbal cues;Difficulty sequencing General Comments: frequent cueing to maintain back precautions. Follows simple commands but increased time for  multiple step commands.      Exercises      General Comments General comments (skin integrity, edema, etc.): VSS, 2/4 DOE      Pertinent Vitals/Pain Pain Assessment: Faces Faces Pain Scale: Hurts little more Pain Location: back Pain Descriptors / Indicators: Grimacing;Guarding;Discomfort Pain Intervention(s): Limited activity within patient's tolerance;Monitored during session;Repositioned    Home Living                      Prior Function            PT Goals (current goals can now be found in the care plan section) Acute Rehab PT Goals Patient Stated Goal: to walk PT Goal Formulation: With patient Time For Goal Achievement: 01/30/21 Potential to Achieve Goals: Good Progress towards PT goals: Progressing toward goals    Frequency    Min 5X/week      PT Plan Current plan remains appropriate    Co-evaluation PT/OT/SLP Co-Evaluation/Treatment: Yes Reason for Co-Treatment: For patient/therapist safety;To address functional/ADL transfers PT goals addressed during session: Mobility/safety with mobility;Balance;Strengthening/ROM OT goals addressed during session: ADL's and self-care      AM-PAC PT "6 Clicks" Mobility   Outcome Measure  Help needed turning from your back to your side while in a flat bed without using bedrails?: A Little Help needed moving from lying on your back to sitting on the side of a flat bed without using bedrails?: A Little Help needed moving to and from a bed to a chair (including a wheelchair)?: A Little Help needed standing up from a chair using your arms (e.g., wheelchair or bedside chair)?: A Little Help needed to walk in hospital room?: A Lot Help needed climbing 3-5 steps with a railing? : Total 6 Click Score: 15    End of Session Equipment Utilized During Treatment: Gait belt Activity Tolerance: Patient limited by fatigue Patient left: in chair;with call bell/phone within reach;with chair alarm set Nurse Communication:  Mobility status;Need for lift equipment PT Visit Diagnosis: Repeated falls (R29.6);Muscle weakness (generalized) (M62.81);History of falling (Z91.81);Difficulty in walking, not elsewhere classified (R26.2);Unsteadiness on feet (R26.81);Other abnormalities of gait and mobility (R26.89)     Time: 8937-3428 PT Time Calculation (min) (ACUTE ONLY): 28 min  Charges:  $Therapeutic Activity: 8-22 mins                     Shanara Schnieders A. Gilford Rile PT, DPT Acute Rehabilitation Services Pager [phone] Office [phone]    Linna Hoff 01/18/2021, 2:56 PM

## 2021-01-18 NOTE — Progress Notes (Addendum)
   Providing Compassionate, Quality Care - Together  NEUROSURGERY PROGRESS NOTE   S: No issues overnight. No new complaints  O: EXAM:  BP (!) 152/98 (BP Location: Right Arm)   Pulse 97   Temp (!) 97.4 F (36.3 C) (Oral)   Resp 18   Ht 5' (1.524 m)   Wt 60.2 kg   SpO2 95%   BMI 25.92 kg/m   Awake, alert, oriented  PERRLA Speech fluent, appropriate  CNs grossly intact  Moves all extremities equally, right lower extremity ankle is casted/Ace wrapped Incision clean dry and intact  ASSESSMENT:  70 y.o. female with   1. T8 HNP  status post thoracic MIS discectomy T8  PLAN: -PT/OT, rehab pending -Pain control -Continue supportive care -Ortho Rec WBAT in postop shoe   Thank you for allowing me to participate in this patient's care.  Please do not hesitate to call with questions or concerns.   Elwin Sleight, [facility] Cell: [phone]

## 2021-01-18 NOTE — Progress Notes (Addendum)
Occupational Therapy Treatment Patient Details Name: Morgan Bell MRN: [id number] DOB: [birth year] Today's Date: 01/18/2021    History of present illness Pt is a 70 y.o. female who presented 4/2 after sustaining a fall 3/26 in which her legs have progressively gotten weaker and she has fallen several times a day since then. 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  This is at a vertebrae where she has had previous compression fracture and acrylic balloon kyphoplasty. S/p lateral L T8 discectomy and decompression 4/5. Imaging revealed fxs of R 5th distal and midportion metatarsal and possible subtle fx through R 5th proximal phalanx. PMH: CVA, obese, melanoma, HTN, exertional angina, arthritis, and osteoporosis.   OT comments  Patient progressing towards OT goals.  Pt anxious with mobilization and fearful of falling.  Completing transfers using stedy with min assist +2 to recliner, LB dressing with max assist +2 and grooming seated unsupported at EOB with min assist.  She requires cueing to adhere to back precautions during session, but able to verbalize precautions with independence.  Encouraged pt (and spoke to nursing about) using stedy for 3:1 commode transfers instead of using purewick to increase transfer practice and strengthening. Updated dc plan to SNF, as patient with decreased support and reports preference to be closer to home.    Follow Up Recommendations  SNF;Supervision/Assistance - 24 hour    Equipment Recommendations  Other (comment) (TBD)    Recommendations for Other Services      Precautions / Restrictions Precautions Precautions: Fall;Back Precaution Booklet Issued: Yes (comment) Precaution Comments: Reviewed back precautions; R post-op shoe Required Braces or Orthoses: Other Brace Spinal Brace:  (no brace needed) Other Brace: post op shoe R LE Restrictions Weight Bearing Restrictions:  Yes Other Position/Activity Restrictions: Verbal orders for WBAT R leg and R post-op shoe       Mobility Bed Mobility Overal bed mobility: Needs Assistance Bed Mobility: Rolling;Sidelying to Sit Rolling: Min guard Sidelying to sit: Min assist       General bed mobility comments: Cueing required for log roll technique and minA for trunk elevation    Transfers Overall transfer level: Needs assistance Equipment used: Ambulation equipment used Transfers: Sit to/from Stand Sit to Stand: Min assist;+2 physical assistance;+2 safety/equipment         General transfer comment: multiple sit to stands in STEDY with minA+2 with cueing for hand placement and safety, posture and precautions    Balance Overall balance assessment: Needs assistance Sitting-balance support: No upper extremity supported Sitting balance-Leahy Scale: Fair Sitting balance - Comments: Able to sit statically without UE support but needs UE support for dynamic tasks.   Standing balance support: Bilateral upper extremity supported;During functional activity Standing balance-Leahy Scale: Poor Standing balance comment: relies on BUE and external support                           ADL either performed or assessed with clinical judgement   ADL Overall ADL's : Needs assistance/impaired     Grooming: Minimal assistance;Sitting;Brushing hair Grooming Details (indicate cue type and reason): to comb hair at EOB, fatigues easily             Lower Body Dressing: Maximal assistance;Sit to/from stand;+2 for physical assistance;+2 for safety/equipment Lower Body Dressing Details (indicate cue type and reason): able to figure 4 L LE to don sock, assist with post op shoe  to R LE, +2 min assist sit to stand and relies on BUE support Toilet Transfer: Minimal assistance;+2 for safety/equipment;+2 for physical assistance Toilet Transfer Details (indicate cue type and reason): pivot using stedy          Functional mobility during ADLs: Minimal assistance;+2 for physical assistance;+2 for safety/equipment       Vision       Perception     Praxis      Cognition Arousal/Alertness: Awake/alert Behavior During Therapy: Anxious Overall Cognitive Status: No family/caregiver present to determine baseline cognitive functioning Area of Impairment: Following commands;Awareness;Problem solving                       Following Commands: Follows one step commands consistently;Follows one step commands with increased time;Follows multi-step commands inconsistently   Awareness: Emergent Problem Solving: Slow processing;Requires verbal cues;Difficulty sequencing General Comments: frequent cueing to maintain back precautions. Follows simple commands but increased time for multiple step commands. Anxious and fearful of falling.        Exercises     Shoulder Instructions       General Comments VSS, fatigues easily with cueing required for PLB    Pertinent Vitals/ Pain       Pain Assessment: Faces Faces Pain Scale: Hurts little more Pain Location: back Pain Descriptors / Indicators: Grimacing;Guarding;Discomfort Pain Intervention(s): Limited activity within patient's tolerance;Monitored during session;Repositioned  Home Living                                          Prior Functioning/Environment              Frequency  Min 2X/week        Progress Toward Goals  OT Goals(current goals can now be found in the care plan section)  Progress towards OT goals: Progressing toward goals  Acute Rehab OT Goals Patient Stated Goal: to walk OT Goal Formulation: With patient  Plan Frequency remains appropriate;Discharge plan needs to be updated    Co-evaluation    PT/OT/SLP Co-Evaluation/Treatment: Yes Reason for Co-Treatment: For patient/therapist safety;To address functional/ADL transfers PT goals addressed during session: Mobility/safety with  mobility;Balance;Strengthening/ROM OT goals addressed during session: ADL's and self-care      AM-PAC OT "6 Clicks" Daily Activity     Outcome Measure   Help from another person eating meals?: A Little Help from another person taking care of personal grooming?: A Little Help from another person toileting, which includes using toliet, bedpan, or urinal?: Total Help from another person bathing (including washing, rinsing, drying)?: A Lot Help from another person to put on and taking off regular upper body clothing?: A Little Help from another person to put on and taking off regular lower body clothing?: A Lot 6 Click Score: 14    End of Session Equipment Utilized During Treatment: Gait belt  OT Visit Diagnosis: Other abnormalities of gait and mobility (R26.89);Muscle weakness (generalized) (M62.81);History of falling (Z91.81)   Activity Tolerance Patient tolerated treatment well   Patient Left in chair;with call bell/phone within reach;with chair alarm set   Nurse Communication Mobility status;Precautions;Other (comment) (use stedy for [facility] transfers)        Time: 1062-6948 OT Time Calculation (min): 28 min  Charges: OT General Charges $OT Visit: 1 Visit OT Treatments $Self Care/Home Management : 8-22 mins  Jolaine Artist, [place] Pager [phone] Office [phone]  Delight Stare 01/18/2021, 3:29 PM

## 2021-01-19 LAB — BASIC METABOLIC PANEL
Anion gap: 7 (ref 5–15)
BUN: 18 mg/dL (ref 8–23)
CO2: 26 mmol/L (ref 22–32)
Calcium: 9.1 mg/dL (ref 8.9–10.3)
Chloride: 105 mmol/L (ref 98–111)
Creatinine, Ser: 0.76 mg/dL (ref 0.44–1.00)
GFR, Estimated: >60 mL/min (ref >60)
Glucose, Bld: 96 mg/dL (ref 70–99)
Potassium: 3.8 mmol/L (ref 3.5–5.1)
Sodium: 138 mmol/L (ref 135–145)

## 2021-01-19 NOTE — Progress Notes (Signed)
Nurse tech was assisting patient back to bed from room chair using the steady, pt became weak and unable to tolerate the steady, began descending to the floor with the assistance of the NT but did not touch the floor. Pt was moved to the chair, vitals taken and stable, then assisted back to bed with the help of additional staff. RN assessment completed, no injuries sustained, back sx site assessed, no issues. Charge nurse made aware.

## 2021-01-19 NOTE — Progress Notes (Signed)
Subjective: Patient reports improving  Objective: Vital signs in last 24 hours: Temp:  [97.3 F (36.3 C)-98.9 F (37.2 C)] 97.5 F (36.4 C) (04/09 0754) Pulse Rate:  [88-119] 97 (04/09 0754) Resp:  [16-21] 16 (04/09 0754) BP: (123-154)/(89-107) 141/90 (04/09 0754) SpO2:  [91 %-95 %] 94 % (04/09 0754)  Intake/Output from previous day: 04/08 0701 - 04/09 0700 In: 236 [P.O.:236] Out: 1700 [Urine:1700] Intake/Output this shift: No intake/output data recorded.  Physical Exam: Strength good in legs. No new complaints.  Lab Results: No results for input(s): WBC, HGB, HCT, PLT in the last 72 hours. BMET Recent Labs    01/18/21 0349 01/19/21 0228  NA 139 138  K 4.1 3.8  CL 103 105  CO2 29 26  GLUCOSE 99 96  BUN 17 18  CREATININE 0.88 0.76  CALCIUM 9.3 9.1    Studies/Results: No results found.  Assessment/Plan: Continue to mobilize WBAT with PT.  Improving.    LOS: 6 days    Peggyann Shoals, MD 01/19/2021, 9:15 AM

## 2021-01-19 NOTE — TOC Progression Note (Signed)
Transition of Care [facility]) - Progression Note    Patient Details  Name: Morgan Bell MRN: [id number] Date of Birth: 12-22-1950  Transition of Care [facility]) CM/SW [place], [place] Phone Number: 01/19/2021, 4:49 PM  Clinical Narrative:    CSW spoke with pt who has decided on [facility]. CSW will update facility, they will need to start authorization. SW will continue to follow for DC planning.         Expected Discharge Plan and Services                                                 Social Determinants of Health (SDOH) Interventions    Readmission Risk Interventions No flowsheet data found.

## 2021-01-19 NOTE — Progress Notes (Signed)
Physical Therapy Treatment Patient Details Name: Morgan Bell MRN: [id number] DOB: 1950-11-16 Today's Date: 01/19/2021    History of Present Illness Pt is a 70 y.o. female who presented 4/2 after sustaining a fall 3/26 in which her legs have progressively gotten weaker and she has fallen several times a day since then. 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  This is at a vertebrae where she has had previous compression fracture and acrylic balloon kyphoplasty. S/p lateral L T8 discectomy and decompression 4/5. Imaging revealed fxs of R 5th distal and midportion metatarsal and possible subtle fx through R 5th proximal phalanx. PMH: CVA, obese, melanoma, HTN, exertional angina, arthritis, and osteoporosis.    PT Comments    Patient progressing towards physical therapy goals. Reviewed back precautions with patient able to recall 3/3 precautions. Patient requires minA for bed mobility and sit to stands with STEDY. Patient limited by endurance. Performed seated exercises with emphasis on eccentric control as patient tends to allow uncontrolled movement of LEs. Continue to recommend SNF for ongoing Physical Therapy.       Follow Up Recommendations  SNF     Equipment Recommendations  None recommended by PT    Recommendations for Other Services       Precautions / Restrictions Precautions Precautions: Fall;Back Precaution Booklet Issued: Yes (comment) Precaution Comments: Reviewed back precautions; R post-op shoe Spinal Brace:  (no brace needed) Restrictions Weight Bearing Restrictions: Yes RLE Weight Bearing: Weight bearing as tolerated Other Position/Activity Restrictions: Verbal orders for WBAT R leg and R post-op shoe    Mobility  Bed Mobility Overal bed mobility: Needs Assistance Bed Mobility: Rolling;Sidelying to Sit Rolling: Min guard Sidelying to sit: Min assist       General bed mobility  comments: Cueing required for log roll technique and minA for trunk elevation    Transfers Overall transfer level: Needs assistance Equipment used: Ambulation equipment used Transfers: Sit to/from Stand Sit to Stand: Min assist         General transfer comment: minA for power up into standing to STEDY, cues required for maintaining back precautions. Sit to stand x 8 in STEDY. Fatigues quickly and required 1-2 minute rest break in between each rep.  Ambulation/Gait             General Gait Details: deferred   Stairs             Wheelchair Mobility    Modified Rankin (Stroke Patients Only)       Balance Overall balance assessment: Needs assistance Sitting-balance support: No upper extremity supported Sitting balance-Leahy Scale: Fair Sitting balance - Comments: statically with supervision, limited dynamically and cueing for upright posture   Standing balance support: Bilateral upper extremity supported;During functional activity Standing balance-Leahy Scale: Poor Standing balance comment: relies on BUE and external support                            Cognition Arousal/Alertness: Awake/alert Behavior During Therapy: Anxious Overall Cognitive Status: No family/caregiver present to determine baseline cognitive functioning Area of Impairment: Following commands;Awareness;Problem solving                       Following Commands: Follows one step commands consistently;Follows one step commands with increased time;Follows multi-step commands inconsistently   Awareness: Emergent Problem Solving: Slow processing;Requires verbal cues;Difficulty sequencing General Comments:  frequent cueing to maintain back precautions. Follows simple commands but increased time for multiple step commands. Anxious and fearful of falling.      Exercises General Exercises - Lower Extremity Long Arc Quad: AROM;Both;10 reps;Seated Hip Flexion/Marching: AROM;Both;10  reps;Seated    General Comments        Pertinent Vitals/Pain Pain Assessment: Faces Faces Pain Scale: Hurts even more Pain Location: back Pain Descriptors / Indicators: Grimacing;Guarding;Discomfort Pain Intervention(s): Limited activity within patient's tolerance;Monitored during session;Repositioned;Patient requesting pain meds-RN notified    Home Living                      Prior Function            PT Goals (current goals can now be found in the care plan section) Acute Rehab PT Goals Patient Stated Goal: to walk PT Goal Formulation: With patient Time For Goal Achievement: 01/30/21 Potential to Achieve Goals: Good Progress towards PT goals: Progressing toward goals    Frequency    Min 5X/week      PT Plan Current plan remains appropriate    Co-evaluation              AM-PAC PT "6 Clicks" Mobility   Outcome Measure  Help needed turning from your back to your side while in a flat bed without using bedrails?: A Little Help needed moving from lying on your back to sitting on the side of a flat bed without using bedrails?: A Little Help needed moving to and from a bed to a chair (including a wheelchair)?: A Little Help needed standing up from a chair using your arms (e.g., wheelchair or bedside chair)?: A Little Help needed to walk in hospital room?: A Lot Help needed climbing 3-5 steps with a railing? : Total 6 Click Score: 15    End of Session Equipment Utilized During Treatment: Gait belt Activity Tolerance: Patient limited by fatigue Patient left: in chair;with call bell/phone within reach;with chair alarm set;with nursing/sitter in room Nurse Communication: Mobility status;Need for lift equipment;Patient requests pain meds PT Visit Diagnosis: Repeated falls (R29.6);Muscle weakness (generalized) (M62.81);History of falling (Z91.81);Difficulty in walking, not elsewhere classified (R26.2);Unsteadiness on feet (R26.81);Other abnormalities of gait  and mobility (R26.89)     Time: 6004-5997 PT Time Calculation (min) (ACUTE ONLY): 25 min  Charges:  $Therapeutic Exercise: 23-37 mins                     Rowyn Mustapha A. Gilford Rile PT, DPT Acute Rehabilitation Services Pager [phone] Office [phone]    Linna Hoff 01/19/2021, 5:23 PM

## 2021-01-19 NOTE — Plan of Care (Signed)

## 2021-01-20 LAB — BASIC METABOLIC PANEL
Anion gap: 6 (ref 5–15)
BUN: 21 mg/dL (ref 8–23)
CO2: 27 mmol/L (ref 22–32)
Calcium: 9.1 mg/dL (ref 8.9–10.3)
Chloride: 104 mmol/L (ref 98–111)
Creatinine, Ser: 0.94 mg/dL (ref 0.44–1.00)
GFR, Estimated: >60 mL/min (ref >60)
Glucose, Bld: 102 mg/dL — ABNORMAL HIGH (ref 70–99)
Potassium: 4 mmol/L (ref 3.5–5.1)
Sodium: 137 mmol/L (ref 135–145)

## 2021-01-20 NOTE — Plan of Care (Signed)
  Problem: Education: Goal: Knowledge of General Education information will improve Description: Including pain rating scale, medication(s)/side effects and non-pharmacologic comfort measures 01/20/2021 1156 by Emmaline Life, RN Outcome: Progressing 01/20/2021 1155 by Emmaline Life, RN Outcome: Progressing   Problem: Health Behavior/Discharge Planning: Goal: Ability to manage health-related needs will improve 01/20/2021 1156 by Emmaline Life, RN Outcome: Progressing 01/20/2021 1155 by Emmaline Life, RN Outcome: Progressing   Problem: Clinical Measurements: Goal: Ability to maintain clinical measurements within normal limits will improve Outcome: Progressing

## 2021-01-20 NOTE — Plan of Care (Signed)
  Problem: Education: Goal: Knowledge of General Education information will improve Description Including pain rating scale, medication(s)/side effects and non-pharmacologic comfort measures Outcome: Progressing   Problem: Health Behavior/Discharge Planning: Goal: Ability to manage health-related needs will improve Outcome: Progressing   

## 2021-01-20 NOTE — Progress Notes (Signed)
Subjective: NAEs o/n  Objective: Vital signs in last 24 hours: Temp:  [97.6 F (36.4 C)-98.2 F (36.8 C)] 98 F (36.7 C) (04/10 0734) Pulse Rate:  [85-109] 85 (04/10 0734) Resp:  [17-20] 18 (04/10 0734) BP: (137-172)/(11-125) 150/89 (04/10 0734) SpO2:  [91 %-100 %] 93 % (04/10 0734)  Intake/Output from previous day: 04/09 0701 - 04/10 0700 In: 597 [P.O.:597] Out: 650 [Urine:650] Intake/Output this shift: Total I/O In: -  Out: 650 [Urine:650]  Awake, alert NAD Full strength in LEs, no sensory deficits R L in splint c/d  Lab Results: No results for input(s): WBC, HGB, HCT, PLT in the last 72 hours. BMET Recent Labs    01/19/21 0228 01/20/21 0218  NA 138 137  K 3.8 4.0  CL 105 104  CO2 26 27  GLUCOSE 96 102*  BUN 18 21  CREATININE 0.76 0.94  CALCIUM 9.1 9.1    Studies/Results: No results found.  Assessment/Plan: S/p thoracic disc excision - cont supportive care, PT/OT - likely d/c to [facility] when insurance processed  Vallarie Mare 01/20/2021, 10:36 AM

## 2021-01-21 NOTE — TOC Progression Note (Signed)
Transition of Care [facility]) - Progression Note    Patient Details  Name: KEYUANA WANK MRN: [id number] Date of Birth: 04-Feb-1951  Transition of Care [facility]) CM/SW [place], [place] Phone Number: 01/21/2021, 4:08 PM  Clinical Narrative:   CSW confirmed bed availability at [facility] and obtained insurance authorization. CSW updated MD that patient can discharge to SNF tomorrow. CSW to follow.    Expected Discharge Plan: Skilled Nursing Facility Barriers to Discharge: Continued Medical Work up  Expected Discharge Plan and Services Expected Discharge Plan: [place]                                               Social Determinants of Health (SDOH) Interventions    Readmission Risk Interventions No flowsheet data found.

## 2021-01-21 NOTE — Progress Notes (Signed)
PT Cancellation Note  Patient Details Name: Morgan Bell MRN: [id number] DOB: 02/24/1951   Cancelled Treatment:    Reason Eval/Treat Not Completed: Pain limiting ability to participate. RN made aware and getting pain meds. Will follow up as time allows today vs another date.  Willow Ora, PTA, [place] [phone] 01/21/21, 11:45 AM   Willow Ora 01/21/2021, 11:45 AM

## 2021-01-22 DIAGNOSIS — F419 Anxiety disorder, unspecified: Secondary | ICD-10-CM | POA: Diagnosis not present

## 2021-01-22 DIAGNOSIS — Z7401 Bed confinement status: Secondary | ICD-10-CM | POA: Diagnosis not present

## 2021-01-22 DIAGNOSIS — S92356A Nondisplaced fracture of fifth metatarsal bone, unspecified foot, initial encounter for closed fracture: Secondary | ICD-10-CM | POA: Diagnosis not present

## 2021-01-22 DIAGNOSIS — G959 Disease of spinal cord, unspecified: Secondary | ICD-10-CM | POA: Diagnosis not present

## 2021-01-22 DIAGNOSIS — R2681 Unsteadiness on feet: Secondary | ICD-10-CM | POA: Diagnosis not present

## 2021-01-22 DIAGNOSIS — R41841 Cognitive communication deficit: Secondary | ICD-10-CM | POA: Diagnosis not present

## 2021-01-22 DIAGNOSIS — M069 Rheumatoid arthritis, unspecified: Secondary | ICD-10-CM | POA: Diagnosis not present

## 2021-01-22 DIAGNOSIS — I10 Essential (primary) hypertension: Secondary | ICD-10-CM | POA: Diagnosis not present

## 2021-01-22 DIAGNOSIS — M48062 Spinal stenosis, lumbar region with neurogenic claudication: Secondary | ICD-10-CM | POA: Diagnosis not present

## 2021-01-22 DIAGNOSIS — Z8679 Personal history of other diseases of the circulatory system: Secondary | ICD-10-CM | POA: Diagnosis not present

## 2021-01-22 DIAGNOSIS — G43909 Migraine, unspecified, not intractable, without status migrainosus: Secondary | ICD-10-CM | POA: Diagnosis not present

## 2021-01-22 DIAGNOSIS — M5104 Intervertebral disc disorders with myelopathy, thoracic region: Secondary | ICD-10-CM | POA: Diagnosis not present

## 2021-01-22 DIAGNOSIS — S92334A Nondisplaced fracture of third metatarsal bone, right foot, initial encounter for closed fracture: Secondary | ICD-10-CM | POA: Diagnosis not present

## 2021-01-22 DIAGNOSIS — R52 Pain, unspecified: Secondary | ICD-10-CM | POA: Diagnosis not present

## 2021-01-22 DIAGNOSIS — E785 Hyperlipidemia, unspecified: Secondary | ICD-10-CM | POA: Diagnosis not present

## 2021-01-22 DIAGNOSIS — M2550 Pain in unspecified joint: Secondary | ICD-10-CM | POA: Diagnosis not present

## 2021-01-22 DIAGNOSIS — Z9181 History of falling: Secondary | ICD-10-CM | POA: Diagnosis not present

## 2021-01-22 DIAGNOSIS — I499 Cardiac arrhythmia, unspecified: Secondary | ICD-10-CM | POA: Diagnosis not present

## 2021-01-22 DIAGNOSIS — S92351D Displaced fracture of fifth metatarsal bone, right foot, subsequent encounter for fracture with routine healing: Secondary | ICD-10-CM | POA: Diagnosis not present

## 2021-01-22 DIAGNOSIS — Z20822 Contact with and (suspected) exposure to covid-19: Secondary | ICD-10-CM | POA: Diagnosis not present

## 2021-01-22 DIAGNOSIS — Z4789 Encounter for other orthopedic aftercare: Secondary | ICD-10-CM | POA: Diagnosis not present

## 2021-01-22 DIAGNOSIS — W19XXXA Unspecified fall, initial encounter: Secondary | ICD-10-CM | POA: Diagnosis not present

## 2021-01-22 DIAGNOSIS — S92354A Nondisplaced fracture of fifth metatarsal bone, right foot, initial encounter for closed fracture: Secondary | ICD-10-CM | POA: Diagnosis not present

## 2021-01-22 DIAGNOSIS — R279 Unspecified lack of coordination: Secondary | ICD-10-CM | POA: Diagnosis not present

## 2021-01-22 DIAGNOSIS — S22060A Wedge compression fracture of T7-T8 vertebra, initial encounter for closed fracture: Secondary | ICD-10-CM | POA: Diagnosis not present

## 2021-01-22 DIAGNOSIS — E669 Obesity, unspecified: Secondary | ICD-10-CM | POA: Diagnosis not present

## 2021-01-22 DIAGNOSIS — M6281 Muscle weakness (generalized): Secondary | ICD-10-CM | POA: Diagnosis not present

## 2021-01-22 DIAGNOSIS — S92344A Nondisplaced fracture of fourth metatarsal bone, right foot, initial encounter for closed fracture: Secondary | ICD-10-CM | POA: Diagnosis not present

## 2021-01-22 DIAGNOSIS — C4359 Malignant melanoma of other part of trunk: Secondary | ICD-10-CM | POA: Diagnosis not present

## 2021-01-22 DIAGNOSIS — Z96641 Presence of right artificial hip joint: Secondary | ICD-10-CM | POA: Diagnosis not present

## 2021-01-22 LAB — SARS CORONAVIRUS 2 (TAT 6-24 HRS): SARS Coronavirus 2: NEGATIVE

## 2021-01-22 MED ORDER — HYDROCODONE-ACETAMINOPHEN 5-325 MG PO TABS: 1.0000 | ORAL_TABLET | ORAL | 0 refills | Status: DC | PRN

## 2021-01-22 MED ORDER — METHOCARBAMOL 500 MG PO TABS: 500.0000 mg | ORAL_TABLET | Freq: Four times a day (QID) | ORAL | 1 refills | Status: DC | PRN

## 2021-01-22 NOTE — Progress Notes (Signed)
Pt wheeled off unit by PTAR. IV removed and pt had all her belongings by her side. Report was called to [facility]. RN Olu took report

## 2021-01-22 NOTE — TOC Transition Note (Signed)
Transition of Care [facility] - CM/SW Discharge Note   Patient Details  Name: Morgan Bell MRN: [id number] Date of Birth: 1951-10-10  Transition of Care [facility]) CM/SW Contact:  Geralynn Ochs, LCSW Phone Number: 01/22/2021, 10:22 AM   Clinical Narrative:   Nurse to call report to [phone].    Final next level of care: Skilled Nursing Facility Barriers to Discharge: Barriers Resolved   Patient Goals and CMS Choice        Discharge Placement              Patient chooses bed at: [facility] Patient to be transferred to facility by: [place] Name of family member notified: Self Patient and family notified of of transfer: 01/22/21  Discharge Plan and Services                                     Social Determinants of Health (SDOH) Interventions     Readmission Risk Interventions No flowsheet data found.

## 2021-01-22 NOTE — Discharge Summary (Signed)
Physician Discharge Summary  Patient ID: Morgan Bell MRN: [id number] DOB/AGE: October 01, 1951 70 y.o.  Admit date: 01/12/2021 Discharge date: 01/22/2021  Admission Diagnoses: Herniated nucleus pulposus T7-T8 with myelopathy.  Right fifth metatarsal fracture  Discharge Diagnoses: Herniated nucleus pulposus T7-T8 with myelopathy.  Right fifth metatarsal fracture. Active Problems:   Myelopathy [facility])   Discharged Condition: fair  Hospital Course: Patient was admitted to under go evaluation after a fall.  She was found to have 1/5 metatarsal fracture on the right foot.  She was complaining of weakness in her leg and severe back pain and an MRI demonstrated the presence of a large disc herniation at the T7-T8 level with cord compression.  She was taken to the operating room where this was decompressed surgically.  Postoperatively he has had some modest weakness in her legs and has had difficulty with ambulation previously which persists she is now being sent for short-term inpatient rehabilitation in a skilled nursing facility before she can function independently at home.  Consults: rehabilitation medicine  Significant Diagnostic Studies: None  Treatments: surgery: Metrix discectomy T7-T8 on the left.  Discharge Exam: Blood pressure (!) 146/78, pulse 77, temperature (!) 97.4 F (36.3 C), temperature source Oral, resp. rate 15, height 5' (1.524 m), weight 60.2 kg, SpO2 96 %. Incision is clean and dry.  Motor function in the lower extremities is 4 out of 5 in major groups including the iliopsoas quadricep tibialis anterior and gastrocs.  Deep tendon reflexes are absent.  Station is wide-based with assistance.  Disposition: Discharge disposition: 03-Skilled [place]       Discharge Instructions    Call MD for:  redness, tenderness, or signs of infection (pain, swelling, redness, odor or green/yellow discharge around incision site)   Complete by: As directed    Call MD for:  severe  uncontrolled pain   Complete by: As directed    Call MD for:  temperature >100.4   Complete by: As directed    Diet - low sodium heart healthy   Complete by: As directed    Incentive spirometry RT   Complete by: As directed    Increase activity slowly   Complete by: As directed    No wound care   Complete by: As directed      Allergies as of 01/22/2021      Reactions   Sulfa Antibiotics Rash   SULFA DRUGS      Medication List    TAKE these medications   amLODipine 10 MG tablet Commonly known as: NORVASC TAKE 1/2 TABLET(5 MG) BY MOUTH TWICE DAILY   aspirin EC 81 MG tablet Take 1 tablet (81 mg total) by mouth daily. Swallow whole.   calcium-vitamin D 500-200 MG-UNIT tablet Commonly known as: OSCAL WITH D Take 1 tablet by mouth daily.   cetirizine 10 MG tablet Commonly known as: ZYRTEC Take 10 mg by mouth daily.   fenofibrate 145 MG tablet Commonly known as: TRICOR TAKE 1 TABLET BY MOUTH DAILY   gabapentin 300 MG capsule Commonly known as: NEURONTIN Take 1 capsule (300 mg total) by mouth 3 (three) times daily.   HYDROcodone-acetaminophen 5-325 MG tablet Commonly known as: NORCO/VICODIN Take 1 tablet by mouth every 4 (four) hours as needed for moderate pain or severe pain.   irbesartan 300 MG tablet Commonly known as: AVAPRO TAKE 1 TABLET BY MOUTH EVERY DAY   leflunomide 20 MG tablet Commonly known as: ARAVA Take 20 mg daily by mouth.   methocarbamol 500 MG tablet  Commonly known as: ROBAXIN Take 1 tablet (500 mg total) by mouth every 6 (six) hours as needed for muscle spasms.   potassium chloride SA 20 MEQ tablet Commonly known as: KLOR-CON Take 1 tablet (20 mEq total) by mouth 2 (two) times daily. SCHEDULE PHYSICAL   rosuvastatin 40 MG tablet Commonly known as: CRESTOR TAKE 1 TABLET BY MOUTH EVERY DAY   Turmeric-Ginger 135-6 MG Chew Chew 2 tablets by mouth daily.       Contact information for after-discharge care    Destination    [facility] Preferred SNF .   Service: Skilled Nursing Contact information: [address] [place] [phone]                  Signed: Earleen Newport 01/22/2021, 7:58 AM

## 2021-01-23 DIAGNOSIS — F419 Anxiety disorder, unspecified: Secondary | ICD-10-CM | POA: Diagnosis not present

## 2021-01-23 DIAGNOSIS — S22060A Wedge compression fracture of T7-T8 vertebra, initial encounter for closed fracture: Secondary | ICD-10-CM | POA: Diagnosis not present

## 2021-01-23 DIAGNOSIS — S92356A Nondisplaced fracture of fifth metatarsal bone, unspecified foot, initial encounter for closed fracture: Secondary | ICD-10-CM | POA: Diagnosis not present

## 2021-01-23 DIAGNOSIS — W19XXXA Unspecified fall, initial encounter: Secondary | ICD-10-CM | POA: Diagnosis not present

## 2021-01-31 DIAGNOSIS — S92356A Nondisplaced fracture of fifth metatarsal bone, unspecified foot, initial encounter for closed fracture: Secondary | ICD-10-CM | POA: Diagnosis not present

## 2021-01-31 DIAGNOSIS — M5104 Intervertebral disc disorders with myelopathy, thoracic region: Secondary | ICD-10-CM | POA: Diagnosis not present

## 2021-01-31 DIAGNOSIS — I10 Essential (primary) hypertension: Secondary | ICD-10-CM | POA: Diagnosis not present

## 2021-01-31 DIAGNOSIS — Z8679 Personal history of other diseases of the circulatory system: Secondary | ICD-10-CM | POA: Diagnosis not present

## 2021-02-06 ENCOUNTER — Other Ambulatory Visit: Payer: Self-pay | Admitting: Cardiovascular Disease

## 2021-02-14 DIAGNOSIS — S92334A Nondisplaced fracture of third metatarsal bone, right foot, initial encounter for closed fracture: Secondary | ICD-10-CM | POA: Diagnosis not present

## 2021-02-14 DIAGNOSIS — S92344A Nondisplaced fracture of fourth metatarsal bone, right foot, initial encounter for closed fracture: Secondary | ICD-10-CM | POA: Diagnosis not present

## 2021-02-14 DIAGNOSIS — S92354A Nondisplaced fracture of fifth metatarsal bone, right foot, initial encounter for closed fracture: Secondary | ICD-10-CM | POA: Diagnosis not present

## 2021-03-03 DIAGNOSIS — R2681 Unsteadiness on feet: Secondary | ICD-10-CM | POA: Diagnosis not present

## 2021-03-03 DIAGNOSIS — Z4789 Encounter for other orthopedic aftercare: Secondary | ICD-10-CM | POA: Diagnosis not present

## 2021-03-03 DIAGNOSIS — M6281 Muscle weakness (generalized): Secondary | ICD-10-CM | POA: Diagnosis not present

## 2021-03-03 DIAGNOSIS — R279 Unspecified lack of coordination: Secondary | ICD-10-CM | POA: Diagnosis not present

## 2021-03-04 DIAGNOSIS — R279 Unspecified lack of coordination: Secondary | ICD-10-CM | POA: Diagnosis not present

## 2021-03-04 DIAGNOSIS — W19XXXA Unspecified fall, initial encounter: Secondary | ICD-10-CM | POA: Diagnosis not present

## 2021-03-04 DIAGNOSIS — S92356A Nondisplaced fracture of fifth metatarsal bone, unspecified foot, initial encounter for closed fracture: Secondary | ICD-10-CM | POA: Diagnosis not present

## 2021-03-04 DIAGNOSIS — S22060S Wedge compression fracture of T7-T8 vertebra, sequela: Secondary | ICD-10-CM | POA: Diagnosis not present

## 2021-03-04 DIAGNOSIS — M6281 Muscle weakness (generalized): Secondary | ICD-10-CM | POA: Diagnosis not present

## 2021-03-04 DIAGNOSIS — R2681 Unsteadiness on feet: Secondary | ICD-10-CM | POA: Diagnosis not present

## 2021-03-04 DIAGNOSIS — Z4789 Encounter for other orthopedic aftercare: Secondary | ICD-10-CM | POA: Diagnosis not present

## 2021-03-04 DIAGNOSIS — R262 Difficulty in walking, not elsewhere classified: Secondary | ICD-10-CM | POA: Diagnosis not present

## 2021-03-05 DIAGNOSIS — R279 Unspecified lack of coordination: Secondary | ICD-10-CM | POA: Diagnosis not present

## 2021-03-05 DIAGNOSIS — R2681 Unsteadiness on feet: Secondary | ICD-10-CM | POA: Diagnosis not present

## 2021-03-05 DIAGNOSIS — Z4789 Encounter for other orthopedic aftercare: Secondary | ICD-10-CM | POA: Diagnosis not present

## 2021-03-05 DIAGNOSIS — M6281 Muscle weakness (generalized): Secondary | ICD-10-CM | POA: Diagnosis not present

## 2021-03-06 DIAGNOSIS — R279 Unspecified lack of coordination: Secondary | ICD-10-CM | POA: Diagnosis not present

## 2021-03-06 DIAGNOSIS — Z4789 Encounter for other orthopedic aftercare: Secondary | ICD-10-CM | POA: Diagnosis not present

## 2021-03-06 DIAGNOSIS — R2681 Unsteadiness on feet: Secondary | ICD-10-CM | POA: Diagnosis not present

## 2021-03-06 DIAGNOSIS — M6281 Muscle weakness (generalized): Secondary | ICD-10-CM | POA: Diagnosis not present

## 2021-03-07 DIAGNOSIS — R279 Unspecified lack of coordination: Secondary | ICD-10-CM | POA: Diagnosis not present

## 2021-03-07 DIAGNOSIS — R2681 Unsteadiness on feet: Secondary | ICD-10-CM | POA: Diagnosis not present

## 2021-03-07 DIAGNOSIS — S92344D Nondisplaced fracture of fourth metatarsal bone, right foot, subsequent encounter for fracture with routine healing: Secondary | ICD-10-CM | POA: Diagnosis not present

## 2021-03-07 DIAGNOSIS — M6281 Muscle weakness (generalized): Secondary | ICD-10-CM | POA: Diagnosis not present

## 2021-03-07 DIAGNOSIS — Z4789 Encounter for other orthopedic aftercare: Secondary | ICD-10-CM | POA: Diagnosis not present

## 2021-03-07 DIAGNOSIS — S92334D Nondisplaced fracture of third metatarsal bone, right foot, subsequent encounter for fracture with routine healing: Secondary | ICD-10-CM | POA: Diagnosis not present

## 2021-03-07 DIAGNOSIS — S92354D Nondisplaced fracture of fifth metatarsal bone, right foot, subsequent encounter for fracture with routine healing: Secondary | ICD-10-CM | POA: Diagnosis not present

## 2021-03-08 DIAGNOSIS — R279 Unspecified lack of coordination: Secondary | ICD-10-CM | POA: Diagnosis not present

## 2021-03-08 DIAGNOSIS — Z4789 Encounter for other orthopedic aftercare: Secondary | ICD-10-CM | POA: Diagnosis not present

## 2021-03-08 DIAGNOSIS — R2681 Unsteadiness on feet: Secondary | ICD-10-CM | POA: Diagnosis not present

## 2021-03-08 DIAGNOSIS — M6281 Muscle weakness (generalized): Secondary | ICD-10-CM | POA: Diagnosis not present

## 2021-03-09 DIAGNOSIS — R279 Unspecified lack of coordination: Secondary | ICD-10-CM | POA: Diagnosis not present

## 2021-03-09 DIAGNOSIS — M6281 Muscle weakness (generalized): Secondary | ICD-10-CM | POA: Diagnosis not present

## 2021-03-09 DIAGNOSIS — R2681 Unsteadiness on feet: Secondary | ICD-10-CM | POA: Diagnosis not present

## 2021-03-09 DIAGNOSIS — Z4789 Encounter for other orthopedic aftercare: Secondary | ICD-10-CM | POA: Diagnosis not present

## 2021-03-11 DIAGNOSIS — M6281 Muscle weakness (generalized): Secondary | ICD-10-CM | POA: Diagnosis not present

## 2021-03-11 DIAGNOSIS — R279 Unspecified lack of coordination: Secondary | ICD-10-CM | POA: Diagnosis not present

## 2021-03-11 DIAGNOSIS — R2681 Unsteadiness on feet: Secondary | ICD-10-CM | POA: Diagnosis not present

## 2021-03-11 DIAGNOSIS — Z4789 Encounter for other orthopedic aftercare: Secondary | ICD-10-CM | POA: Diagnosis not present

## 2021-03-12 DIAGNOSIS — M6281 Muscle weakness (generalized): Secondary | ICD-10-CM | POA: Diagnosis not present

## 2021-03-12 DIAGNOSIS — R2681 Unsteadiness on feet: Secondary | ICD-10-CM | POA: Diagnosis not present

## 2021-03-12 DIAGNOSIS — R279 Unspecified lack of coordination: Secondary | ICD-10-CM | POA: Diagnosis not present

## 2021-03-12 DIAGNOSIS — Z4789 Encounter for other orthopedic aftercare: Secondary | ICD-10-CM | POA: Diagnosis not present

## 2021-03-13 DIAGNOSIS — R279 Unspecified lack of coordination: Secondary | ICD-10-CM | POA: Diagnosis not present

## 2021-03-13 DIAGNOSIS — R2681 Unsteadiness on feet: Secondary | ICD-10-CM | POA: Diagnosis not present

## 2021-03-13 DIAGNOSIS — M6281 Muscle weakness (generalized): Secondary | ICD-10-CM | POA: Diagnosis not present

## 2021-03-13 DIAGNOSIS — Z4789 Encounter for other orthopedic aftercare: Secondary | ICD-10-CM | POA: Diagnosis not present

## 2021-03-14 DIAGNOSIS — Z4789 Encounter for other orthopedic aftercare: Secondary | ICD-10-CM | POA: Diagnosis not present

## 2021-03-14 DIAGNOSIS — R2681 Unsteadiness on feet: Secondary | ICD-10-CM | POA: Diagnosis not present

## 2021-03-14 DIAGNOSIS — R279 Unspecified lack of coordination: Secondary | ICD-10-CM | POA: Diagnosis not present

## 2021-03-14 DIAGNOSIS — M6281 Muscle weakness (generalized): Secondary | ICD-10-CM | POA: Diagnosis not present

## 2021-03-15 DIAGNOSIS — R279 Unspecified lack of coordination: Secondary | ICD-10-CM | POA: Diagnosis not present

## 2021-03-15 DIAGNOSIS — R2681 Unsteadiness on feet: Secondary | ICD-10-CM | POA: Diagnosis not present

## 2021-03-15 DIAGNOSIS — Z4789 Encounter for other orthopedic aftercare: Secondary | ICD-10-CM | POA: Diagnosis not present

## 2021-03-15 DIAGNOSIS — M6281 Muscle weakness (generalized): Secondary | ICD-10-CM | POA: Diagnosis not present

## 2021-03-18 DIAGNOSIS — Z4789 Encounter for other orthopedic aftercare: Secondary | ICD-10-CM | POA: Diagnosis not present

## 2021-03-18 DIAGNOSIS — R279 Unspecified lack of coordination: Secondary | ICD-10-CM | POA: Diagnosis not present

## 2021-03-18 DIAGNOSIS — R2681 Unsteadiness on feet: Secondary | ICD-10-CM | POA: Diagnosis not present

## 2021-03-18 DIAGNOSIS — M6281 Muscle weakness (generalized): Secondary | ICD-10-CM | POA: Diagnosis not present

## 2021-03-19 DIAGNOSIS — R2681 Unsteadiness on feet: Secondary | ICD-10-CM | POA: Diagnosis not present

## 2021-03-19 DIAGNOSIS — Z4789 Encounter for other orthopedic aftercare: Secondary | ICD-10-CM | POA: Diagnosis not present

## 2021-03-19 DIAGNOSIS — R279 Unspecified lack of coordination: Secondary | ICD-10-CM | POA: Diagnosis not present

## 2021-03-19 DIAGNOSIS — M6281 Muscle weakness (generalized): Secondary | ICD-10-CM | POA: Diagnosis not present

## 2021-03-20 DIAGNOSIS — Z4789 Encounter for other orthopedic aftercare: Secondary | ICD-10-CM | POA: Diagnosis not present

## 2021-03-20 DIAGNOSIS — R2681 Unsteadiness on feet: Secondary | ICD-10-CM | POA: Diagnosis not present

## 2021-03-20 DIAGNOSIS — R279 Unspecified lack of coordination: Secondary | ICD-10-CM | POA: Diagnosis not present

## 2021-03-20 DIAGNOSIS — M6281 Muscle weakness (generalized): Secondary | ICD-10-CM | POA: Diagnosis not present

## 2021-03-22 DIAGNOSIS — R2681 Unsteadiness on feet: Secondary | ICD-10-CM | POA: Diagnosis not present

## 2021-03-22 DIAGNOSIS — R279 Unspecified lack of coordination: Secondary | ICD-10-CM | POA: Diagnosis not present

## 2021-03-22 DIAGNOSIS — Z4789 Encounter for other orthopedic aftercare: Secondary | ICD-10-CM | POA: Diagnosis not present

## 2021-03-22 DIAGNOSIS — M6281 Muscle weakness (generalized): Secondary | ICD-10-CM | POA: Diagnosis not present

## 2021-03-23 DIAGNOSIS — M6281 Muscle weakness (generalized): Secondary | ICD-10-CM | POA: Diagnosis not present

## 2021-03-23 DIAGNOSIS — Z4789 Encounter for other orthopedic aftercare: Secondary | ICD-10-CM | POA: Diagnosis not present

## 2021-03-23 DIAGNOSIS — R279 Unspecified lack of coordination: Secondary | ICD-10-CM | POA: Diagnosis not present

## 2021-03-23 DIAGNOSIS — R2681 Unsteadiness on feet: Secondary | ICD-10-CM | POA: Diagnosis not present

## 2021-03-24 DIAGNOSIS — M6281 Muscle weakness (generalized): Secondary | ICD-10-CM | POA: Diagnosis not present

## 2021-03-24 DIAGNOSIS — R279 Unspecified lack of coordination: Secondary | ICD-10-CM | POA: Diagnosis not present

## 2021-03-24 DIAGNOSIS — R2681 Unsteadiness on feet: Secondary | ICD-10-CM | POA: Diagnosis not present

## 2021-03-24 DIAGNOSIS — Z4789 Encounter for other orthopedic aftercare: Secondary | ICD-10-CM | POA: Diagnosis not present

## 2021-03-25 DIAGNOSIS — M6281 Muscle weakness (generalized): Secondary | ICD-10-CM | POA: Diagnosis not present

## 2021-03-25 DIAGNOSIS — Z4789 Encounter for other orthopedic aftercare: Secondary | ICD-10-CM | POA: Diagnosis not present

## 2021-03-25 DIAGNOSIS — R2681 Unsteadiness on feet: Secondary | ICD-10-CM | POA: Diagnosis not present

## 2021-03-25 DIAGNOSIS — R279 Unspecified lack of coordination: Secondary | ICD-10-CM | POA: Diagnosis not present

## 2021-03-26 DIAGNOSIS — S22060S Wedge compression fracture of T7-T8 vertebra, sequela: Secondary | ICD-10-CM | POA: Diagnosis not present

## 2021-03-26 DIAGNOSIS — R2681 Unsteadiness on feet: Secondary | ICD-10-CM | POA: Diagnosis not present

## 2021-03-26 DIAGNOSIS — R262 Difficulty in walking, not elsewhere classified: Secondary | ICD-10-CM | POA: Diagnosis not present

## 2021-03-26 DIAGNOSIS — Z4789 Encounter for other orthopedic aftercare: Secondary | ICD-10-CM | POA: Diagnosis not present

## 2021-03-26 DIAGNOSIS — M5104 Intervertebral disc disorders with myelopathy, thoracic region: Secondary | ICD-10-CM | POA: Diagnosis not present

## 2021-03-26 DIAGNOSIS — M6281 Muscle weakness (generalized): Secondary | ICD-10-CM | POA: Diagnosis not present

## 2021-03-26 DIAGNOSIS — R279 Unspecified lack of coordination: Secondary | ICD-10-CM | POA: Diagnosis not present

## 2021-03-26 DIAGNOSIS — S92356A Nondisplaced fracture of fifth metatarsal bone, unspecified foot, initial encounter for closed fracture: Secondary | ICD-10-CM | POA: Diagnosis not present

## 2021-03-27 DIAGNOSIS — Z4789 Encounter for other orthopedic aftercare: Secondary | ICD-10-CM | POA: Diagnosis not present

## 2021-03-27 DIAGNOSIS — R279 Unspecified lack of coordination: Secondary | ICD-10-CM | POA: Diagnosis not present

## 2021-03-27 DIAGNOSIS — M6281 Muscle weakness (generalized): Secondary | ICD-10-CM | POA: Diagnosis not present

## 2021-03-27 DIAGNOSIS — R2681 Unsteadiness on feet: Secondary | ICD-10-CM | POA: Diagnosis not present

## 2021-03-28 ENCOUNTER — Ambulatory Visit: Payer: Medicare Other | Admitting: Family Medicine

## 2021-03-28 DIAGNOSIS — Z4789 Encounter for other orthopedic aftercare: Secondary | ICD-10-CM | POA: Diagnosis not present

## 2021-03-28 DIAGNOSIS — R2681 Unsteadiness on feet: Secondary | ICD-10-CM | POA: Diagnosis not present

## 2021-03-28 DIAGNOSIS — M6281 Muscle weakness (generalized): Secondary | ICD-10-CM | POA: Diagnosis not present

## 2021-03-28 DIAGNOSIS — R279 Unspecified lack of coordination: Secondary | ICD-10-CM | POA: Diagnosis not present

## 2021-04-02 ENCOUNTER — Other Ambulatory Visit: Payer: Self-pay

## 2021-04-02 ENCOUNTER — Ambulatory Visit: Payer: Medicare Other | Admitting: Family Medicine

## 2021-04-02 DIAGNOSIS — I10 Essential (primary) hypertension: Secondary | ICD-10-CM

## 2021-04-04 DIAGNOSIS — G43009 Migraine without aura, not intractable, without status migrainosus: Secondary | ICD-10-CM | POA: Diagnosis not present

## 2021-04-04 DIAGNOSIS — S92356D Nondisplaced fracture of fifth metatarsal bone, unspecified foot, subsequent encounter for fracture with routine healing: Secondary | ICD-10-CM | POA: Diagnosis not present

## 2021-04-04 DIAGNOSIS — M069 Rheumatoid arthritis, unspecified: Secondary | ICD-10-CM | POA: Diagnosis not present

## 2021-04-04 DIAGNOSIS — M48062 Spinal stenosis, lumbar region with neurogenic claudication: Secondary | ICD-10-CM | POA: Diagnosis not present

## 2021-04-04 DIAGNOSIS — Z9181 History of falling: Secondary | ICD-10-CM | POA: Diagnosis not present

## 2021-04-04 DIAGNOSIS — Z8673 Personal history of transient ischemic attack (TIA), and cerebral infarction without residual deficits: Secondary | ICD-10-CM | POA: Diagnosis not present

## 2021-04-04 DIAGNOSIS — Z7982 Long term (current) use of aspirin: Secondary | ICD-10-CM | POA: Diagnosis not present

## 2021-04-04 DIAGNOSIS — Z4789 Encounter for other orthopedic aftercare: Secondary | ICD-10-CM | POA: Diagnosis not present

## 2021-04-04 DIAGNOSIS — E785 Hyperlipidemia, unspecified: Secondary | ICD-10-CM | POA: Diagnosis not present

## 2021-04-04 DIAGNOSIS — F419 Anxiety disorder, unspecified: Secondary | ICD-10-CM | POA: Diagnosis not present

## 2021-04-04 DIAGNOSIS — F32A Depression, unspecified: Secondary | ICD-10-CM | POA: Diagnosis not present

## 2021-04-04 DIAGNOSIS — C4359 Malignant melanoma of other part of trunk: Secondary | ICD-10-CM | POA: Diagnosis not present

## 2021-04-04 DIAGNOSIS — S22069D Unspecified fracture of T7-T8 vertebra, subsequent encounter for fracture with routine healing: Secondary | ICD-10-CM | POA: Diagnosis not present

## 2021-04-04 DIAGNOSIS — W19XXXD Unspecified fall, subsequent encounter: Secondary | ICD-10-CM | POA: Diagnosis not present

## 2021-04-04 DIAGNOSIS — I10 Essential (primary) hypertension: Secondary | ICD-10-CM | POA: Diagnosis not present

## 2021-04-10 ENCOUNTER — Telehealth: Payer: Self-pay | Admitting: *Deleted

## 2021-04-10 NOTE — Chronic Care Management (AMB) (Signed)
  Chronic Care Management   Note  04/10/2021 Name: Morgan Bell MRN: [id number] DOB: May 03, 1951  Morgan Bell is a 70 y.o. year old female who is a primary care patient of Jearld Fenton, NP. I reached out to Morgan Bell by phone today in response to a referral sent by Ms. Michaelle Copas Dimitroff's PCP Jearld Fenton, NP     [name] was given information about Chronic Care Management services today including:  CCM service includes personalized support from designated clinical staff supervised by her physician, including individualized plan of care and coordination with other care providers 24/7 contact phone numbers for assistance for urgent and routine care needs. Service will only be billed when office clinical staff spend 20 minutes or more in a month to coordinate care. Only one practitioner may furnish and bill the service in a calendar month. The patient may stop CCM services at any time (effective at the end of the month) by phone call to the office staff. The patient will be responsible for cost sharing (co-pay) of up to 20% of the service fee (after annual deductible is met).  Patient agreed to services and verbal consent obtained.   Follow up plan: Telephone appointment with care management team member scheduled for:04/16/2021  Julian Hy, [place] [facility]  Direct Dial: [phone]

## 2021-04-16 ENCOUNTER — Ambulatory Visit (INDEPENDENT_AMBULATORY_CARE_PROVIDER_SITE_OTHER): Payer: Medicare Other

## 2021-04-16 ENCOUNTER — Telehealth: Payer: Self-pay | Admitting: *Deleted

## 2021-04-16 DIAGNOSIS — E785 Hyperlipidemia, unspecified: Secondary | ICD-10-CM | POA: Diagnosis not present

## 2021-04-16 DIAGNOSIS — I10 Essential (primary) hypertension: Secondary | ICD-10-CM | POA: Diagnosis not present

## 2021-04-16 NOTE — Patient Instructions (Signed)
Visit Information:  Thank you for taking the time to speak with me today.   PATIENT GOALS:   Goals Addressed             This Visit's Progress    Lifestyle Change-Hypertension / hyperlipidemia   On track    Timeframe:  Long-Range Goal Priority:  Medium Start Date:    04/16/2021                         Expected End Date:   07/12/2021                    Follow Up Date 05/28/2021   - check blood pressure at least 3 times per week and record in log/ diary.  - Take your medications as prescribed. Call for medicine refills 2 or 3 days before it runs out - change to whole grain breads, cereal, pasta, eat 3 to 5 servings of fruits and vegetables each day, eat fish and/ or lean meats, manage your portion size Avoid sugar and refined carbohydrates - Review education information on heart healthy, low salt diet sent to you in MyChart by your RN case manager.  - Exercise regularly and according to your doctors recommendatio   Why is this important?   The changes that you are asked to make may be hard to do.  This is especially true when the changes are life-long.  Knowing why it is important to you is the first step.  Working on the change with your family or support person helps you not feel alone.  Reward yourself and family or support person when goals are met. This can be an activity you choose like bowling, hiking, biking, swimming or shooting hoops.               Consent to CCM Services: Morgan Bell was given information about Chronic Care Management services today including:  CCM service includes personalized support from designated clinical staff supervised by her physician, including individualized plan of care and coordination with other care providers 24/7 contact phone numbers for assistance for urgent and routine care needs. Service will only be billed when office clinical staff spend 20 minutes or more in a month to coordinate care. Only one practitioner may furnish and bill  the service in a calendar month. The patient may stop CCM services at any time (effective at the end of the month) by phone call to the office staff. The patient will be responsible for cost sharing (co-pay) of up to 20% of the service fee (after annual deductible is met).  Patient agreed to services and verbal consent obtained.   Patient verbalizes understanding of instructions provided today and agrees to view in [place].   The patient has been provided with contact information for the care management team and has been advised to call with any health related questions or concerns.  The care management team will reach out to the patient again over the next 45 days.   [name] RN,BSN,CCM RN Case Manager [facility]  [phone]   CLINICAL CARE PLAN: Patient Care Plan: Cardiovascular     Problem Identified: Knowledge deficit related to long term care plan for self management of hypertension and hyperlipidemia   Priority: Medium     Long-Range Goal: Hypertension / hyperlipidemia  Monitored and managed   Start Date: 04/16/2021  Expected End Date: 07/12/2021  This Visit's Progress: On track  Priority: Medium  Note:  Objective:  Last practice recorded BP readings:  BP Readings from Last 3 Encounters:  01/22/21 (!) 148/81  01/12/21 (!) 132/48  11/07/20 138/87  Current antihyperlipidemic regimen: Rosuvastatin Most recent lipid panel:     Component Value Date/Time   CHOL 106 05/13/2020 2253   CHOL 120 04/23/2018 0829   TRIG 168 (H) 05/13/2020 2253   HDL 23 (L) 05/13/2020 2253   HDL 17 (L) 04/23/2018 0829   CHOLHDL 4.6 05/13/2020 2253   VLDL 34 05/13/2020 2253   LDLCALC 49 05/13/2020 2253   LDLCALC 41 04/23/2018 0829   LDLDIRECT 57.0 09/06/2018 1242  Current Barriers:  Knowledge Deficits related to basic understanding of hypertension and hyperlipidemia pathophysiology and self care management:  Patient with history of non obstructive CAD, HTN, Hyperlipidemia.   Patient states she had a history of her triglycerides being 1100 now down to mid 100's. She reports ongoing follow up with her cardiologist for management.  Patient states she takes her medications as prescribed and was active with walking. She reports having 3 back surgeries in the past and a recent 4th back surgery after sustaining a back fracture from a fall in April 2022.   Patient reports she is not able to walk distances as before. She reports doing mild exercise at home and working with the home health PT/ OT on exercises.  Unable to provide transportation:  Patient states her son provides transportation to doctor appointments and to run errands.  Case Manager Clinical Goal(s):  patient will verbalize understanding of plan for hypertension / hyperlipidemia  management patient will attend scheduled medical appointments patient will demonstrate improved health management independence as evidenced by checking blood pressure as directed and notifying PCP,  taking all medications as prescribed, and adhering to a low sodium diet as discussed. patient will verbalize basic understanding of hyperlipidemia/ hypertension disease process and self health management plan the patient will work with the care management team towards completion of advanced directives Interventions:  Collaboration with Jearld Fenton, NP regarding development and update of comprehensive plan of care as evidenced by provider attestation and co-signature Inter-disciplinary care team collaboration (see longitudinal plan of care) Assisted patient with initiation or completion of advanced directives (see documentation in Vynca) Reviewed medications with patient and discussed  Provided patient with educational materials related to hypertension and hyperlipidemia Reviewed scheduled/upcoming provider appointments:  Patient reports next follow up with neurologist is 04/18/2010 and next follow up with primary care provider is 04/29/2021. RNCM  contacted patients provider office and confirmed patient will see Dr. Damita Dunnings in the interim on 04/29/2021 and will receive her new primary care provider in August 2022.  Discussed this with patient and patient verbalized understanding.  Discussed plans with patient for ongoing care management follow up and provided patient with direct contact information for care management team Advised patient, providing education and rationale, to monitor blood pressure daily and record, calling PCP for findings outside established parameters.  Advance Directive packet mailed to patient.  Advised to call RNCM for questions or concerns.   Patient Goals: - check blood pressure at least 3 times per week and record in log/ diary.  - Take your medications as prescribed. Call for medicine refills 2 or 3 days before it runs out - change to whole grain breads, cereal, pasta, eat 3 to 5 servings of fruits and vegetables each day, eat fish and/ or lean meats, manage your portion size Avoid sugar and refined carbohydrates - Review education information on heart healthy, low salt  diet sent to you in MyChart by your RN case manager.  - Exercise regularly and according to your doctors recommendation Follow Up Plan: The patient has been provided with contact information for the care management team and has been advised to call with any health related questions or concerns.  The care management team will reach out to the patient again over the next 45 days.

## 2021-04-16 NOTE — Chronic Care Management (AMB) (Signed)
Chronic Care Management   CCM RN Visit Note  04/16/2021 Name: Morgan Bell MRN: [id number] DOB: 12-23-1950  Subjective: Morgan Bell is a 70 y.o. year old female who is a primary care patient of Jearld Fenton, NP. The care management team was consulted for assistance with disease management and care coordination needs.    Engaged with patient by telephone for initial visit in response to provider referral for case management and/or care coordination services.   Consent to Services:  The patient was given the following information about Chronic Care Management services today, agreed to services, and gave verbal consent: 1. CCM service includes personalized support from designated clinical staff supervised by the primary care provider, including individualized plan of care and coordination with other care providers 2. 24/7 contact phone numbers for assistance for urgent and routine care needs. 3. Service will only be billed when office clinical staff spend 20 minutes or more in a month to coordinate care. 4. Only one practitioner may furnish and bill the service in a calendar month. 5.The patient may stop CCM services at any time (effective at the end of the month) by phone call to the office staff. 6. The patient will be responsible for cost sharing (co-pay) of up to 20% of the service fee (after annual deductible is met). Patient agreed to services and consent obtained.  Patient agreed to services and verbal consent obtained.   Assessment: Review of patient past medical history, allergies, medications, health status, including review of consultants reports, laboratory and other test data, was performed as part of comprehensive evaluation and provision of chronic care management services.   SDOH (Social Determinants of Health) assessments and interventions performed:  SDOH Interventions    Flowsheet Row Most Recent Value  SDOH Interventions   Food Insecurity Interventions Intervention  Not Indicated  Physical Activity Interventions Other (Comments)  [patient reports she is also working with home physical and occupational therapy]  Transportation Interventions Intervention Not Indicated        CCM Care Plan  Allergies  Allergen Reactions   Sulfa Antibiotics Rash    SULFA DRUGS    Outpatient Encounter Medications as of 04/16/2021  Medication Sig   amLODipine (NORVASC) 10 MG tablet TAKE 1/2 TABLET(5 MG) BY MOUTH TWICE DAILY   aspirin EC 81 MG tablet Take 1 tablet (81 mg total) by mouth daily. Swallow whole.   calcium-vitamin D (OSCAL WITH D) 500-200 MG-UNIT tablet Take 1 tablet by mouth daily.   cetirizine (ZYRTEC) 10 MG tablet Take 10 mg by mouth daily.   fenofibrate (TRICOR) 145 MG tablet TAKE 1 TABLET BY MOUTH DAILY   gabapentin (NEURONTIN) 300 MG capsule Take 1 capsule (300 mg total) by mouth 3 (three) times daily.   irbesartan (AVAPRO) 300 MG tablet TAKE 1 TABLET BY MOUTH EVERY DAY   leflunomide (ARAVA) 20 MG tablet Take 20 mg daily by mouth.   potassium chloride SA (KLOR-CON) 20 MEQ tablet Take 1 tablet (20 mEq total) by mouth 2 (two) times daily. SCHEDULE PHYSICAL   rosuvastatin (CRESTOR) 40 MG tablet TAKE 1 TABLET BY MOUTH EVERY DAY   Turmeric-Ginger 135-6 MG CHEW Chew 2 tablets by mouth daily.   HYDROcodone-acetaminophen (NORCO/VICODIN) 5-325 MG tablet Take 1 tablet by mouth every 4 (four) hours as needed for moderate pain or severe pain. (Patient not taking: Reported on 04/16/2021)   methocarbamol (ROBAXIN) 500 MG tablet Take 1 tablet (500 mg total) by mouth every 6 (six) hours as needed for muscle spasms. (  Patient not taking: Reported on 04/16/2021)   No facility-administered encounter medications on file as of 04/16/2021.    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 05/13/2020   Lumbar stenosis with neurogenic claudication  10/18/2019   Exertional angina ([place]) 07/16/2018   Dyslipidemia 10/24/2016   TIA (transient ischemic attack) 01/02/2015   Rheumatoid arthritis ([place]) 01/02/2015   HTN (hypertension) 04/10/2014   Melanoma of trunk ([place]) 01/31/2013    Conditions to be addressed/monitored:HTN and HLD  Care Plan : Cardiovascular  Updates made by Dannielle Karvonen, RN since 04/16/2021 12:00 AM   Problem: Knowledge deficit related to long term care plan for self management of hypertension and hyperlipidemia   Priority: Medium   Long-Range Goal: Hypertension / hyperlipidemia  Monitored and managed   Start Date: 04/16/2021  Expected End Date: 07/12/2021  This Visit's Progress: On track  Priority: Medium  Objective:  Last practice recorded BP readings:  BP Readings from Last 3 Encounters:  01/22/21 (!) 148/81  01/12/21 (!) 132/48  11/07/20 138/87  Current antihyperlipidemic regimen: Rosuvastatin Most recent lipid panel:     Component Value Date/Time   CHOL 106 05/13/2020 2253   CHOL 120 04/23/2018 0829   TRIG 168 (H) 05/13/2020 2253   HDL 23 (L) 05/13/2020 2253   HDL 17 (L) 04/23/2018 0829   CHOLHDL 4.6 05/13/2020 2253   VLDL 34 05/13/2020 2253   LDLCALC 49 05/13/2020 2253   LDLCALC 41 04/23/2018 0829   LDLDIRECT 57.0 09/06/2018 1242  Current Barriers:  Knowledge Deficits related to basic understanding of hypertension and hyperlipidemia pathophysiology and self care management:  Patient with history of non obstructive CAD, HTN, Hyperlipidemia.  Patient states she had a history of her triglycerides being 1100 now down to mid 100's. She reports ongoing follow up with her cardiologist for management.  Patient states she takes her medications as prescribed and was active with walking. She reports having 3 back surgeries in the past and a recent 4th back surgery after sustaining a back fracture from a fall in April 2022.   Patient reports she is not able to walk distances as before. She reports doing mild  exercise at home and working with the home health PT/ OT on exercises.  Unable to provide transportation:  Patient states her son provides transportation to doctor appointments and to run errands.  Case Manager Clinical Goal(s):  patient will verbalize understanding of plan for hypertension / hyperlipidemia  management patient will attend scheduled medical appointments patient will demonstrate improved health management independence as evidenced by checking blood pressure as directed and notifying PCP,  taking all medications as prescribed, and adhering to a low sodium diet as discussed. patient will verbalize basic understanding of hyperlipidemia/ hypertension disease process and self health management plan the patient will work with the care management team towards completion of advanced directives Interventions:  Collaboration with Jearld Fenton, NP regarding development and update of comprehensive plan of care as evidenced by provider attestation and co-signature Inter-disciplinary care team collaboration (see longitudinal plan of care) Assisted patient with initiation or completion of advanced directives (see documentation in Vyn[facility]) Reviewed medications with patient and discussed  Provided patient with educational materials related to hypertension and hyperlipidemia Reviewed scheduled/upcoming provider appointments:  Patient reports next follow up with neurologist is 04/18/2010 and next follow up with primary care provider is 04/29/2021. RNCM  contacted patients provider office and confirmed patient will see Dr. Damita Dunnings in the interim on 04/29/2021 and will receive her new primary care provider in August 2022.  Discussed this with patient and patient verbalized understanding.  Discussed plans with patient for ongoing care management follow up and provided patient with direct contact information for care management team Advised patient, providing education and rationale, to monitor blood pressure  daily and record, calling PCP for findings outside established parameters.  Advance Directive packet mailed to patient.  Advised to call RNCM for questions or concerns.   Patient Goals: - check blood pressure at least 3 times per week and record in log/ diary.  - Take your medications as prescribed. Call for medicine refills 2 or 3 days before it runs out - change to whole grain breads, cereal, pasta, eat 3 to 5 servings of fruits and vegetables each day, eat fish and/ or lean meats, manage your portion size Avoid sugar and refined carbohydrates - Review education information on heart healthy, low salt diet sent to you in MyChart by your RN case manager.  - Exercise regularly and according to your doctors recommendation Follow Up Plan: The patient has been provided with contact information for the care management team and has been advised to call with any health related questions or concerns.  The care management team will reach out to the patient again over the next 45 days.          Plan:The patient has been provided with contact information for the care management team and has been advised to call with any health related questions or concerns.  and The care management team will reach out to the patient again over the next 45 days. [name] RN,BSN,CCM RN Case Manager [place]  [phone]

## 2021-04-16 NOTE — Telephone Encounter (Signed)
   Telephone encounter was:  Successful.  04/16/2021 Name: Morgan Bell MRN: [id number] DOB: 01/05/1951  Morgan Bell is a 70 y.o. year old female who is a primary care patient of Jearld Fenton, NP . The community resource team was consulted for assistance with Transportation Needs  and [place] guide performed the following interventions: Patient provided with information about care guide support team and interviewed to confirm resource needs.patient is going to try [employer] and will send her a food stamp application .   Follow Up Plan:  No further follow up planned at this time. The patient has been provided with needed resources. Amanda Park, Care Management  [phone] [address] , [place] [address] Email : [email or web address]

## 2021-04-17 ENCOUNTER — Telehealth: Payer: Self-pay | Admitting: Cardiovascular Disease

## 2021-04-17 MED ORDER — AMLODIPINE BESYLATE 5 MG PO TABS: 5.0000 mg | ORAL_TABLET | Freq: Two times a day (BID) | ORAL | 1 refills | Status: DC

## 2021-04-17 NOTE — Telephone Encounter (Signed)
Patient currently taking: Amlodipine 10 mg tablet-take 1/2 tablet (5mg ) two times daily  Sent new rx for amlodipine 5 mg tablet BID.

## 2021-04-17 NOTE — Telephone Encounter (Signed)
Glenard Haring called to see see if a new prescription can be written for patient for amLODipine (NORVASC) 10 MG tablet. Because she is getting confuse on what she should be talking. Can it be witting for 5mg  and not 10mg  that way she dont have to be concern with make sure its half. If so please send new prescription to pharmacy. Please advise

## 2021-04-18 ENCOUNTER — Encounter: Payer: Self-pay | Admitting: Adult Health

## 2021-04-18 ENCOUNTER — Telehealth: Payer: Self-pay

## 2021-04-18 ENCOUNTER — Ambulatory Visit: Payer: Medicare Other | Admitting: Adult Health

## 2021-04-18 NOTE — Telephone Encounter (Signed)
Pt arrived late to her 04/18/21 appt. Pt was not seen.

## 2021-04-22 ENCOUNTER — Other Ambulatory Visit: Payer: Self-pay

## 2021-04-22 ENCOUNTER — Ambulatory Visit: Payer: Medicare Other | Admitting: Adult Health

## 2021-04-22 ENCOUNTER — Encounter: Payer: Self-pay | Admitting: Adult Health

## 2021-04-22 ENCOUNTER — Telehealth: Payer: Self-pay

## 2021-04-22 VITALS — BP 154/94 | HR 92 | Ht 60.0 in | Wt 140.4 lb

## 2021-04-22 DIAGNOSIS — E785 Hyperlipidemia, unspecified: Secondary | ICD-10-CM

## 2021-04-22 DIAGNOSIS — I614 Nontraumatic intracerebral hemorrhage in cerebellum: Secondary | ICD-10-CM

## 2021-04-22 DIAGNOSIS — M4714 Other spondylosis with myelopathy, thoracic region: Secondary | ICD-10-CM | POA: Diagnosis not present

## 2021-04-22 DIAGNOSIS — M25511 Pain in right shoulder: Secondary | ICD-10-CM

## 2021-04-22 DIAGNOSIS — I10 Essential (primary) hypertension: Secondary | ICD-10-CM

## 2021-04-22 NOTE — Addendum Note (Signed)
Addended by: Frann Rider L on: 04/22/2021 11:52 AM   Modules accepted: Orders

## 2021-04-22 NOTE — Patient Instructions (Signed)
Continue aspirin 81 mg daily  and Crestor  for secondary stroke prevention  Continue to follow up with PCP regarding cholesterol and blood pressure management  Maintain strict control of hypertension with blood pressure goal below 130/90 and cholesterol with LDL cholesterol (bad cholesterol) goal below 70 mg/dL.   Ensure you follow up with Dr. Ellene Route - contact them at [phone]  Referral placed to orthopedics for further evaluation of right shoulder pain - you will be called to schedule  Please ensure that you limit your sodium intake to help with swelling in your legs     Followup in the future with me in 4 months or call earlier if needed       Thank you for coming to see [place] at [facility]. I hope we have been able to provide you high quality care today.  You may receive a patient satisfaction survey over the next few weeks. We would appreciate your feedback and comments so that we may continue to improve ourselves and the health of our patients.

## 2021-04-22 NOTE — Telephone Encounter (Signed)
Referral to orthopedics sent to [facility]. P: [phone]

## 2021-04-22 NOTE — Progress Notes (Signed)
[facility] [address] [place]. [place] [address] [phone]       STROKE FOLLOW UP NOTE  [name] Date of Birth:  Apr 06, 1951 Medical Record Number:  [id number]   Reason for Referral: stroke follow up    SUBJECTIVE:   CHIEF COMPLAINT:  Chief Complaint  Patient presents with   Follow-up    Rm 6 alone- here for 6 month f/u. Reports she has been doing ok. Suffered a back injury back in April and is participating in [place] for this at home through [employer].     HPI:   Today, 04/22/2021, Morgan Bell returns for 71-month stroke follow-up.  She has been stable from stroke standpoint without new stroke/TIA symptoms but unfortunately started to have multiple falls with BLE weakness and presented to UC on 01/12/2021 s/p fall and transferred to [facility] ED for further evaluation.  She was found to have right foot metatarsal fracture as well as large disc herniation at the T7-T8 level with cord compression which requires surgical decompression.  She was transferred to short-term SNF rehab due to continued BLE weakness and gait difficulty.  She has since returned back home beginning of June and is currently participating in [facility] PT. she does report continued gradual improvement.  She is able to ambulate short distance with RW and 1 person assistance.  She denies any recent falls. She has been experiencing right shoulder pain with movement which has been gradually worsening since returning home.  She questions possible injury with her prior fall.  She does report prior history of right shoulder partial rotator cuff tear which had previously been stable.  Her sons are helping with assistance as needed such as providing meals and getting groceries.  Residual stroke deficits of decreased right hand dexterity stable.  Compliant on aspirin, Crestor and fenofibrate for secondary stroke prevention without associated side effects.  Blood pressure today 154/94.  She does have visit to  establish care with new PCP Dr. Damita Dunnings 7/18 as her prior PCP left office (per patient).  No further concerns at this time.   History provided for reference purposes only Update 11/07/2020 JM: Morgan Bell returns to discuss disability paperwork.  She has not returned back to work since her stroke on 05/13/2020 due to residual gait impairment, imbalance and decreased right hand dexterity.  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  She is currently ambulating with a four-point cane and has difficulty with longer distance ambulation.  She was previously using single-point cane after her lumbar laminectomy in 10/2019 but was able to ambulate longer distance without difficulty.  She does not feel as though she would be able to return back to work at this point and be able to maintain current job requirements.  She is hopeful that she will be able to return to work at some point and wishes to wait for at least 1 full year after her stroke for max potential recovery.  Update 10/18/2020 JM: Morgan Bell returns for prolonged 29-month stroke follow-up with prior visit on 06/14/2020.  Residual gait impairment with some improvement and right hand decreased dexterity stable without worsening. Currently using a cane for ambulation but has recently been experiencing worsening back pain.  History of back pain undergoing surgical procedure approximately 1 year ago.  She was unsure pain related to prior back procedure or poststroke due to gait abnormality.  Previously prescribed gabapentin 100 mg 4 times daily but self increased currently  taking 300/200/300 with great benefit.  She has been able to increase activity and denies any side effects. Denies new stroke/TIA symptoms.  Repeat CT head 06/2020 showed resolution of prior ICH therefore restarted aspirin 81 mg daily for secondary stroke prevention.  She has remained on aspirin 81 mg daily, Crestor and fenofibrate without side effects.   Blood pressure today 127/84. Monitors at home which has been stable.  No further concerns at this time.  Initial visit 06/14/2020 JM: Morgan Bell is being seen for hospital follow-up She has since returned home from SNF stay Reports continued deficits imbalance, and right hand weakness  She does report improvement but not yet to baseline She is working with [facility] PT/SLP  and nurse.  Reports SLP will be continued as she previously was experiencing cognitive impairment with delayed recall but this has since improved Ambulates long distance with RW but is able to ambulate short distance without assistive device Currently living alone maintaining all ADLs and majority of IADLs without difficulty  Her son lives nearby and assist as needed Denies new or worsening stroke/TIA symptoms Continues on Crestor and fenofibrate without side effects Blood pressure today 135/91.  Routinely monitors at home and typically 130s/90s.  Reports compliance with amlodipine 5 mg daily and Avapro 300 mg daily.   She has had follow-up with PCP since discharge No further concerns at this time  Stroke admission 05/13/2020 Morgan Bell is a 70 y.o. female with history of hypertension with questionable compliance to medication, headaches, angina, rheumatoid arthritis, TIA 21 years ago with no residual deficits who presented on 05/13/2020 with dizziness and difficulty walking.  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  Aspirin PTA discontinued in setting of ICH. Hx of HTN initially on Cleviprex stabilize during admission and resumed home meds of amlodipine and Avapro. Of note, questionable medication noncompliance.  Hx of HLD with LDL 49 and recommended resuming home dose Crestor and fenofibrate at discharge due to [place] (held during admission).  Other stroke risk factors include advanced age, history of TIA and migraines.  Other active problems include RA on Arava and melanoma.  Evaluated by  therapy initially recommending home health services but due to lack of assistance at home, discharge to SNF for functional decline and further therapy needs.  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 Small vessel disease. Atrophy. Sinus dz. CTA head & neck no vascular abnormality. Mild atherosclerosis. Tortuosity.   MRI w/w/o R cerebellar hemorrhage w/ mild edema. No lesion. R basal ganglia and thalamic chronic microhemorrhages. 2D Echo EF 55-60%. No source of embolus   LDL 49 HgbA1c 5.6 aspirin 81 mg daily prior to admission, now on No antithrombotic given hemorrhage  Therapy recommendations:  HH PT, OT, SLP -> agree w/ SNF as assist no available Disposition: short term SNF - lived alone PTA. Son can stay with her on the weekend    ROS:   14 system review of systems performed and negative with exception of those listed in HPI  PMH:  Past Medical History:  Diagnosis Date   Arthritis    RA   Exertional angina ([place]) 07/16/2018   CT coronary 09/13/18: Coronary Ca score 62.1, 68% for age/sex matched control, No evidence of obstructive CAD (1-24% LAD, RCA)   Family history of adverse reaction to anesthesia    son has difficulty waking    Headache(784.0)    HX MIGRAINES  Hypertension    Melanoma ([place])    Obesity    Stroke ([place])    MINI STROKE     21 YRS AGO     PSH:  Past Surgical History:  Procedure Laterality Date   APPLICATION OF ROBOTIC ASSISTANCE FOR SPINAL PROCEDURE N/A 10/18/2019   Procedure: APPLICATION OF ROBOTIC ASSISTANCE FOR SPINAL PROCEDURE;  Surgeon: Kristeen Miss, MD;  Location: [place];  Service: Neurosurgery;  Laterality: N/A;   CESAREAN SECTION  11/26/1973   CESAREAN SECTION  09/22/1975   CESAREAN SECTION  04/30/1978   HEMORRHOID SURGERY  06/1987   LUMBAR LAMINECTOMY/DECOMPRESSION MICRODISCECTOMY N/A 01/15/2021   Procedure: Thoracic Seven-Eight Laminectomy with transpedicular discectomy;   Surgeon: Kristeen Miss, MD;  Location: [place];  Service: Neurosurgery;  Laterality: N/A;  Thoracic Seven-Eight Laminectomy with transpedicular discectomy   MELANOMA EXCISION WITH SENTINEL LYMPH NODE BIOPSY Right 03/02/2013   Procedure: Wide MELANOMA EXCISION Right Abdominal wall WITH SENTINEL LYMPH NODE mapping Right Axilla;  Surgeon: Merrie Roof, MD;  Location: [place];  Service: General;  Laterality: Right;   NM MYOCAR PERF WALL MOTION  2003   negative Bruce protocol exercise stress test with no evidence of perfusion abnormality, EF 66%   TOTAL HIP ARTHROPLASTY Right 03/2011    Social History:  Social History   Socioeconomic History   Marital status: Divorced    Spouse name: Not on file   Number of children: 3   Years of education: 12   Highest education level: Not on file  Occupational History    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   Tobacco Use   Smoking status: Never   Smokeless tobacco: Never  Vaping Use   Vaping Use: Never used  Substance and Sexual Activity   Alcohol use: No   Drug use: No   Sexual activity: Never  Other Topics Concern   Not on file  Social History Narrative   Not on file   Social Determinants of Health   Financial Resource Strain: Not on file  Food Insecurity: No Food Insecurity   Worried About Running Out of Food in the Last Year: Never true   Ran Out of Food in the Last Year: Never true  Transportation Needs: No Transportation Needs   Lack of Transportation (Medical): No   Lack of Transportation (Non-Medical): No  Physical Activity: Unknown   Days of Exercise per Week: 3 days   Minutes of Exercise per Session: Not on file  Stress: Not on file  Social Connections: Not on file  Intimate Partner Violence: Not on file    Family History:  Family History  Problem Relation Age of Onset   Alzheimer's disease Mother    Heart disease Father        MI, died at 26   Alzheimer's disease Maternal  Grandmother    Cancer Maternal Grandfather        lung   Heart attack Paternal Grandfather    Tuberculosis Sister     Medications:   Current Outpatient Medications on File Prior to Visit  Medication Sig Dispense Refill   amLODipine (NORVASC) 5 MG tablet Take 1 tablet (5 mg total) by mouth 2 (two) times daily. 180 tablet 1   aspirin EC 81 MG tablet Take 1 tablet (81 mg total) by mouth daily. Swallow whole. 30 tablet 11   calcium-vitamin D (OSCAL WITH D) 500-200 MG-UNIT tablet Take 1 tablet by mouth daily.     cetirizine (ZYRTEC) 10  MG tablet Take 10 mg by mouth daily.     fenofibrate (TRICOR) 145 MG tablet TAKE 1 TABLET BY MOUTH DAILY 30 tablet 11   gabapentin (NEURONTIN) 300 MG capsule Take 1 capsule (300 mg total) by mouth 3 (three) times daily. 270 capsule 3   irbesartan (AVAPRO) 300 MG tablet TAKE 1 TABLET BY MOUTH EVERY DAY 30 tablet 8   leflunomide (ARAVA) 20 MG tablet Take 20 mg daily by mouth.     methocarbamol (ROBAXIN) 500 MG tablet Take 1 tablet (500 mg total) by mouth every 6 (six) hours as needed for muscle spasms. 40 tablet 1   potassium chloride SA (KLOR-CON) 20 MEQ tablet Take 1 tablet (20 mEq total) by mouth 2 (two) times daily. SCHEDULE PHYSICAL 60 tablet 0   rosuvastatin (CRESTOR) 40 MG tablet TAKE 1 TABLET BY MOUTH EVERY DAY 30 tablet 5   Turmeric-Ginger 135-6 MG CHEW Chew 2 tablets by mouth daily.     No current facility-administered medications on file prior to visit.    Allergies:   Allergies  Allergen Reactions   Sulfa Antibiotics Rash    SULFA DRUGS      OBJECTIVE:  Physical Exam  Vitals:   04/22/21 1013  BP: (!) 154/94  Pulse: 92  Weight: 140 lb 6 oz (63.7 kg)  Height: 5' (1.524 m)    Body mass index is 27.42 kg/m.  General: well developed, well nourished, very pleasant elderly Caucasian female, seated in w/c  Head: head normocephalic and atraumatic.   Neck: supple with no carotid or supraclavicular bruits Cardiovascular: regular rate and  rhythm, no murmurs Musculoskeletal: limited R shoulder AROM; PROM with increased pain Skin:  no rash/petichiae Vascular:  Normal pulses all extremities   Neurologic Exam Mental Status: Awake and fully alert.  Fluent speech and language. Oriented to place and time. Recent and remote memory intact. Attention span, concentration and fund of knowledge appropriate. Mood and affect appropriate.  Cranial Nerves: Pupils equal, briskly reactive to light. Extraocular movements full without nystagmus. Visual fields full to confrontation. Hearing intact. Facial sensation intact. Face, tongue, palate moves normally and symmetrically.  Motor: Normal bulk and tone. LUE: 5/5. RUE: slightly decreased right hand dexterity and fine motor control (dominant hand) as well as mild RUE elbow flexion weakness and deltoid weakness. 4/5 BLE Sensory.: intact to touch , pinprick , position and vibratory sensation.  Coordination: Rapid alternating movements normal in all extremities except slightly decreased right hand dexterity. Finger-to-nose performed accurately LUE and heel-to-shin performed accurately bilaterally. Gait and Station: Deferred  Reflexes: 1+ and symmetric. Toes downgoing.        ASSESSMENT: Morgan Bell is a 70 y.o. 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 Vascular risk factors include HTN, HLD, advanced age, history of TIA (21 years ago) and migraine headaches. S/p left discectomy T7-T8 for large disc herniation with cord compression after a fall 01/2021     PLAN:  Hypertensive R cerebellar ICH:  Residual deficit: gait impairment and decreased right hand dexterity.   Repeat CT head 06/22/2020 resolution of ICH Continue aspirin 81 mg daily and Crestor 40 mg daily and fenofibrate for secondary stroke prevention.  Discussed secondary stroke prevention measures and importance of  close PCP follow up for aggressive stroke risk factor management  Thoracic myelopathy: MR lumbar large disc herniation at T7-T8 level with cord compression as well as compression fractures at T8 and T9  s/p Metrix discectomy 01/2021.  Advised to contact neurosurgeon Dr. Ellene Route to schedule follow-up visit post procedure.  Has been gradually recovering recovering  Right shoulder pain: per patient, hx of right partial torn rotator cuff - worsened since fall possibly traumatic injury vs overuse with use of rolling walker -referral placed to orthopedics for further evaluation HTN:  BP goal <130/90. Stable on amlodipine and irbesartan per PCP/cardiology HLD:  LDL goal <70. Prior LDL 49.  On Crestor 40 mg daily and fenofibrate 145 mg daily per cardiology. Request lipid panel at f/u visit with PCP next week    Follow-up in 4 months or call earlier if needed   CC:  [facility] provider: Dr. Oliver Hum, Elveria Rising, MD    I spent 35 minutes of face-to-face and non-face-to-face time with patient.  This included previsit chart review, lab review, study review, electronic health record documentation, patient education regarding prior stroke as well as secondary stroke prevention measures and aggressive stroke risk factor management,  residual deficits, recent back procedure as above and right shoulder pain and answered all other questions to patient's satisfaction   Frann Rider, AGNP-BC  [facility] [address] [place], [place] [address]  Phone [phone] Fax [phone] Note: This document was prepared with digital dictation and possible smart phrase technology. Any transcriptional errors that result from this process are unintentional.

## 2021-04-23 NOTE — Progress Notes (Signed)
I agree with the above plan 

## 2021-04-26 ENCOUNTER — Other Ambulatory Visit: Payer: Self-pay | Admitting: Internal Medicine

## 2021-04-29 ENCOUNTER — Ambulatory Visit (INDEPENDENT_AMBULATORY_CARE_PROVIDER_SITE_OTHER): Payer: Medicare Other | Admitting: Family Medicine

## 2021-04-29 ENCOUNTER — Encounter: Payer: Self-pay | Admitting: Family Medicine

## 2021-04-29 ENCOUNTER — Other Ambulatory Visit: Payer: Self-pay

## 2021-04-29 VITALS — BP 128/74 | HR 96 | Temp 97.7°F | Ht 60.0 in | Wt 139.0 lb

## 2021-04-29 DIAGNOSIS — I10 Essential (primary) hypertension: Secondary | ICD-10-CM | POA: Diagnosis not present

## 2021-04-29 DIAGNOSIS — S32000D Wedge compression fracture of unspecified lumbar vertebra, subsequent encounter for fracture with routine healing: Secondary | ICD-10-CM

## 2021-04-29 DIAGNOSIS — S32000S Wedge compression fracture of unspecified lumbar vertebra, sequela: Secondary | ICD-10-CM | POA: Diagnosis not present

## 2021-04-29 DIAGNOSIS — R350 Frequency of micturition: Secondary | ICD-10-CM | POA: Diagnosis not present

## 2021-04-29 DIAGNOSIS — Z7189 Other specified counseling: Secondary | ICD-10-CM

## 2021-04-29 LAB — CBC WITH DIFFERENTIAL/PLATELET
Basophils Absolute: 0.1 10*3/uL (ref 0.0–0.1)
Basophils Relative: 1.7 % (ref 0.0–3.0)
Eosinophils Absolute: 0.3 10*3/uL (ref 0.0–0.7)
Eosinophils Relative: 6.4 % — ABNORMAL HIGH (ref 0.0–5.0)
HCT: 35.5 % — ABNORMAL LOW (ref 36.0–46.0)
Hemoglobin: 11.8 g/dL — ABNORMAL LOW (ref 12.0–15.0)
Lymphocytes Relative: 14.1 % (ref 12.0–46.0)
Lymphs Abs: 0.6 10*3/uL — ABNORMAL LOW (ref 0.7–4.0)
MCHC: 33.4 g/dL (ref 30.0–36.0)
MCV: 91.8 fl (ref 78.0–100.0)
Monocytes Absolute: 0.6 10*3/uL (ref 0.1–1.0)
Monocytes Relative: 14.6 % — ABNORMAL HIGH (ref 3.0–12.0)
Neutro Abs: 2.7 10*3/uL (ref 1.4–7.7)
Neutrophils Relative %: 63.2 % (ref 43.0–77.0)
Platelets: 152 10*3/uL (ref 150.0–400.0)
RBC: 3.86 Mil/uL — ABNORMAL LOW (ref 3.87–5.11)
RDW: 18.6 % — ABNORMAL HIGH (ref 11.5–15.5)
WBC: 4.2 10*3/uL (ref 4.0–10.5)

## 2021-04-29 LAB — LIPID PANEL
Cholesterol: 94 mg/dL (ref 0–200)
HDL: 19.9 mg/dL — ABNORMAL LOW (ref >39.00)
NonHDL: 74.36
Total CHOL/HDL Ratio: 5
Triglycerides: 278 mg/dL — ABNORMAL HIGH (ref 0.0–149.0)
VLDL: 55.6 mg/dL — ABNORMAL HIGH (ref 0.0–40.0)

## 2021-04-29 LAB — COMPREHENSIVE METABOLIC PANEL
ALT: 20 U/L (ref 0–35)
AST: 39 U/L — ABNORMAL HIGH (ref 0–37)
Albumin: 3.7 g/dL (ref 3.5–5.2)
Alkaline Phosphatase: 143 U/L — ABNORMAL HIGH (ref 39–117)
BUN: 21 mg/dL (ref 6–23)
CO2: 24 mEq/L (ref 19–32)
Calcium: 10 mg/dL (ref 8.4–10.5)
Chloride: 106 mEq/L (ref 96–112)
Creatinine, Ser: 0.85 mg/dL (ref 0.40–1.20)
GFR: 68.43 mL/min (ref >60.00)
Glucose, Bld: 77 mg/dL (ref 70–99)
Potassium: 3.9 mEq/L (ref 3.5–5.1)
Sodium: 139 mEq/L (ref 135–145)
Total Bilirubin: 0.5 mg/dL (ref 0.2–1.2)
Total Protein: 6.2 g/dL (ref 6.0–8.3)

## 2021-04-29 LAB — VITAMIN D 25 HYDROXY (VIT D DEFICIENCY, FRACTURES): VITD: 49.1 ng/mL (ref 30.00–100.00)

## 2021-04-29 LAB — LDL CHOLESTEROL, DIRECT: Direct LDL: 44 mg/dL

## 2021-04-29 MED ORDER — METHOCARBAMOL 500 MG PO TABS: 500.0000 mg | ORAL_TABLET | Freq: Four times a day (QID) | ORAL | 1 refills | Status: DC | PRN

## 2021-04-29 NOTE — Patient Instructions (Signed)
Try methocarbamol for your back pain.  Go to the lab on the way out.   If you have mychart we'll likely use that to update you.    Take care.  Glad to see you.

## 2021-04-29 NOTE — Progress Notes (Signed)
This visit occurred during the SARS-CoV-2 public health emergency.  Safety protocols were in place, including screening questions prior to the visit, additional usage of staff PPE, and extensive cleaning of exam room while observing appropriate contact time as indicated for disinfecting solutions.  Status post inpatient treatment for compression fracture.  Not driving.  S/p SNF placement for rehab.  Still with some leg weakness but getting better.  Still exercising with PT at home, via HHPT.  No radicular pain. She is not on methocarbamol currently.  R sided back pain, just inferior to the R scapula.    Currently pain is 3/10, from her back.  Rheumatoid sx controlled.  She sees Dr. Annamaria Boots with rheum.  She is going to get rescheduled.  Discussed  Urinary frequency w/o pain.  She leaks with cough/laugh.  Going on for years.  D/w pt about Kegel exercises.    Hypertension:    Using medication without problems or lightheadedness: yes Chest pain with exertion:no Edema:no Short of breath: some with exertion, with h/o hiatal hernia and deconditioning noted.  Can still do PT exercises.  Dyspnea isn't worse.   Labs pending.    Morgan Bell designated if patient were incapacitated.    Meds, vitals, and allergies reviewed.   ROS: Per HPI unless specifically indicated in ROS section   GEN: nad, alert and oriented HEENT: ncat NECK: supple w/o LA CV: rrr.   PULM: ctab, no inc wob ABD: soft, +bs EXT: no edema SKIN: no acute rash She has some mild tenderness inferior to the right scapula.  No bruising. Intact plantar and dorsiflexion of bilateral feet, equally.

## 2021-05-01 DIAGNOSIS — M19011 Primary osteoarthritis, right shoulder: Secondary | ICD-10-CM | POA: Diagnosis not present

## 2021-05-02 ENCOUNTER — Other Ambulatory Visit: Payer: Self-pay | Admitting: Family Medicine

## 2021-05-02 DIAGNOSIS — Z7189 Other specified counseling: Secondary | ICD-10-CM | POA: Insufficient documentation

## 2021-05-02 DIAGNOSIS — M4850XA Collapsed vertebra, not elsewhere classified, site unspecified, initial encounter for fracture: Secondary | ICD-10-CM | POA: Insufficient documentation

## 2021-05-02 DIAGNOSIS — D649 Anemia, unspecified: Secondary | ICD-10-CM

## 2021-05-02 DIAGNOSIS — R7989 Other specified abnormal findings of blood chemistry: Secondary | ICD-10-CM

## 2021-05-02 DIAGNOSIS — R350 Frequency of micturition: Secondary | ICD-10-CM | POA: Insufficient documentation

## 2021-05-02 NOTE — Assessment & Plan Note (Signed)
Overall pain is improved.  Strength is improved after SNF placement/rehab.  Reasonable to recheck labs today.  See notes on labs.  Upper back pain appears to be a separate issue and this is likely muscular.  She can use methocarbamol as needed for that and update [place] as needed.

## 2021-05-02 NOTE — Assessment & Plan Note (Signed)
We will stress urinary incontinence.  Discussed with patient about trying Kegel exercises.

## 2021-05-02 NOTE — Assessment & Plan Note (Signed)
Recheck labs pending.  Continue amlodipine 5 mg twice a day, irbesartan and potassium.  Blood pressures controlled.

## 2021-05-02 NOTE — Assessment & Plan Note (Signed)
  Shahana Spallino designated if patient were incapacitated.

## 2021-05-06 DIAGNOSIS — E785 Hyperlipidemia, unspecified: Secondary | ICD-10-CM | POA: Diagnosis not present

## 2021-05-06 DIAGNOSIS — G43009 Migraine without aura, not intractable, without status migrainosus: Secondary | ICD-10-CM | POA: Diagnosis not present

## 2021-05-06 DIAGNOSIS — C4359 Malignant melanoma of other part of trunk: Secondary | ICD-10-CM | POA: Diagnosis not present

## 2021-05-06 DIAGNOSIS — S22069D Unspecified fracture of T7-T8 vertebra, subsequent encounter for fracture with routine healing: Secondary | ICD-10-CM | POA: Diagnosis not present

## 2021-05-06 DIAGNOSIS — Z7982 Long term (current) use of aspirin: Secondary | ICD-10-CM | POA: Diagnosis not present

## 2021-05-06 DIAGNOSIS — Z8673 Personal history of transient ischemic attack (TIA), and cerebral infarction without residual deficits: Secondary | ICD-10-CM | POA: Diagnosis not present

## 2021-05-06 DIAGNOSIS — I10 Essential (primary) hypertension: Secondary | ICD-10-CM | POA: Diagnosis not present

## 2021-05-06 DIAGNOSIS — S92356D Nondisplaced fracture of fifth metatarsal bone, unspecified foot, subsequent encounter for fracture with routine healing: Secondary | ICD-10-CM | POA: Diagnosis not present

## 2021-05-06 DIAGNOSIS — Z4789 Encounter for other orthopedic aftercare: Secondary | ICD-10-CM | POA: Diagnosis not present

## 2021-05-06 DIAGNOSIS — Z9181 History of falling: Secondary | ICD-10-CM | POA: Diagnosis not present

## 2021-05-06 DIAGNOSIS — F419 Anxiety disorder, unspecified: Secondary | ICD-10-CM | POA: Diagnosis not present

## 2021-05-06 DIAGNOSIS — M48062 Spinal stenosis, lumbar region with neurogenic claudication: Secondary | ICD-10-CM | POA: Diagnosis not present

## 2021-05-06 DIAGNOSIS — F32A Depression, unspecified: Secondary | ICD-10-CM | POA: Diagnosis not present

## 2021-05-06 DIAGNOSIS — W19XXXD Unspecified fall, subsequent encounter: Secondary | ICD-10-CM | POA: Diagnosis not present

## 2021-05-06 DIAGNOSIS — M069 Rheumatoid arthritis, unspecified: Secondary | ICD-10-CM | POA: Diagnosis not present

## 2021-05-08 ENCOUNTER — Telehealth: Payer: Self-pay | Admitting: Cardiovascular Disease

## 2021-05-08 DIAGNOSIS — Z5181 Encounter for therapeutic drug level monitoring: Secondary | ICD-10-CM

## 2021-05-08 DIAGNOSIS — I10 Essential (primary) hypertension: Secondary | ICD-10-CM

## 2021-05-08 NOTE — Telephone Encounter (Signed)
[facility] is calling in regards to the pts BP 150/100 he says it reads while sitting down

## 2021-05-08 NOTE — Telephone Encounter (Signed)
PT just left house.  States that she was asymptomatic.  150/100 today- prior to exertion  Took medications this morning.  140's / 85's for a normal limit.   2-3 more hours before evening dose is needed.   Medications on current list for BP:  Amlodipine 5 mg twice daily  Irbesartan 300 mg daily   Will route to PharmD and MD to advise of any changes.

## 2021-05-09 NOTE — Telephone Encounter (Signed)
Attempted to contact patient, to discuss starting medication. Patient did not answer.  Unable to leave a voicemail.

## 2021-05-10 MED ORDER — SPIRONOLACTONE 25 MG PO TABS: 25.0000 mg | ORAL_TABLET | Freq: Every day | ORAL | 0 refills | Status: DC

## 2021-05-10 NOTE — Telephone Encounter (Signed)
Advised patient, verbalized understanding   Add spironolactone 25 mg a day.  Check basic metabolic panel in a week.  ----- Message -----  From: Caprice Beaver, LPN  Sent: [id number]   2:24 PM EDT  To: Skeet Latch, MD, Earvin Hansen, LPN, *          ----- Message -----  From: Lars Pinks  Sent: 05/08/2021   2:12 PM EDT  To: Cv Div Nl Triage

## 2021-05-14 ENCOUNTER — Telehealth: Payer: Self-pay

## 2021-05-14 NOTE — Telephone Encounter (Signed)
Angel RN with [facility] said she saw pt today and pt has a new complaint; since 05/10/21 on and off pt has sharp stabbing upper abd pain on rt side under rib cage area. The pain radiates around to her rt shoulder blade and mid back. The pain woke pt  up one night (not sure when). Today BP 144/80 P 98 pain level now is 4 but 2 or 3 times pain level has gone to 10. No N or V. Pt has new med of spironolactone and pt is not drinking a lot. Pt only drank couple of glasses of water yesterday. Glenard Haring advised pt needed to drink more liquids and thought pt should go to [facility] UC or ED due to new onset of significant pain. I agreed with Glenard Haring and Glenard Haring will contact pt (has already left pts home) and let her know that [facility] agrees pt needs eval and possible testing at [facility] or ED. Glenard Haring will also advise pt in near future to call [facility] to do TOC to another provider unless pt is going to follow Avie Echevaria NP. Glenard Haring voiced understanding and will remind pt. Sending note to [facility] pool and Dr Damita Dunnings since he last saw pt on 04/29/21.

## 2021-05-14 NOTE — Telephone Encounter (Signed)
Agree with UC/ER eval. Thanks.

## 2021-05-20 ENCOUNTER — Ambulatory Visit (INDEPENDENT_AMBULATORY_CARE_PROVIDER_SITE_OTHER): Payer: Medicare Other | Admitting: Family Medicine

## 2021-05-20 ENCOUNTER — Encounter: Payer: Self-pay | Admitting: Family Medicine

## 2021-05-20 ENCOUNTER — Other Ambulatory Visit: Payer: Self-pay

## 2021-05-20 VITALS — BP 130/82 | HR 105 | Temp 97.4°F | Ht 60.0 in | Wt 135.0 lb

## 2021-05-20 DIAGNOSIS — R1011 Right upper quadrant pain: Secondary | ICD-10-CM | POA: Diagnosis not present

## 2021-05-20 MED ORDER — AMLODIPINE BESYLATE 5 MG PO TABS: 5.0000 mg | ORAL_TABLET | Freq: Two times a day (BID) | ORAL | 1 refills | Status: DC

## 2021-05-20 MED ORDER — OMEPRAZOLE 20 MG PO CPDR: 20.0000 mg | DELAYED_RELEASE_CAPSULE | Freq: Every day | ORAL | 3 refills | Status: DC

## 2021-05-20 NOTE — Patient Instructions (Signed)
Clear liquids in the meantime.  Avoid fatty foods.  If pain is worse, go to ER.   We'll call about seeing up an ultrasound.  Labs today.  I would try taking prilosec to see if that helps at all.  Take care.  Glad to see you.

## 2021-05-20 NOTE — Progress Notes (Signed)
This visit occurred during the SARS-CoV-2 public health emergency.  Safety protocols were in place, including screening questions prior to the visit, additional usage of staff PPE, and extensive cleaning of exam room while observing appropriate contact time as indicated for disinfecting solutions.  R sided abd pain.  Started after last OV.  Stabbing pain intermittently on the R side, comes and goes.  Worse with eating. No blood in urine or stool.  No black or bloody stools.  More pain sitting up, less pain supine.  No vomiting, no nausea.  Going on for about 10 days.  No fevers.  Pain is some better now.  Better with cutting back on meat and fatty foods.  On gabapentin at baseline for back pain.    Meds, vitals, and allergies reviewed.   ROS: Per HPI unless specifically indicated in ROS section   GEN: nad, alert and oriented HEENT: ncat NECK: supple w/o LA CV: rrr.  PULM: ctab, no inc wob ABD: soft, +bs, mildly tender to palpation in the right upper quadrant without rebound.  Abdomen nontender otherwise. EXT: no edema SKIN: no acute rash

## 2021-05-21 ENCOUNTER — Telehealth: Payer: Self-pay

## 2021-05-21 LAB — CBC WITH DIFFERENTIAL/PLATELET
Basophils Absolute: 0.1 10*3/uL (ref 0.0–0.1)
Basophils Relative: 1.1 % (ref 0.0–3.0)
Eosinophils Absolute: 0.2 10*3/uL (ref 0.0–0.7)
Eosinophils Relative: 3.2 % (ref 0.0–5.0)
HCT: 38.6 % (ref 36.0–46.0)
Hemoglobin: 12.4 g/dL (ref 12.0–15.0)
Lymphocytes Relative: 7.9 % — ABNORMAL LOW (ref 12.0–46.0)
Lymphs Abs: 0.5 10*3/uL — ABNORMAL LOW (ref 0.7–4.0)
MCHC: 32.2 g/dL (ref 30.0–36.0)
MCV: 92.9 fl (ref 78.0–100.0)
Monocytes Absolute: 0.8 10*3/uL (ref 0.1–1.0)
Monocytes Relative: 11.6 % (ref 3.0–12.0)
Neutro Abs: 5.3 10*3/uL (ref 1.4–7.7)
Neutrophils Relative %: 76.2 % (ref 43.0–77.0)
Platelets: 119 10*3/uL — ABNORMAL LOW (ref 150.0–400.0)
RBC: 4.16 Mil/uL (ref 3.87–5.11)
RDW: 16.5 % — ABNORMAL HIGH (ref 11.5–15.5)
WBC: 6.9 10*3/uL (ref 4.0–10.5)

## 2021-05-21 LAB — COMPREHENSIVE METABOLIC PANEL
ALT: 14 U/L (ref 0–35)
AST: 29 U/L (ref 0–37)
Albumin: 3.8 g/dL (ref 3.5–5.2)
Alkaline Phosphatase: 92 U/L (ref 39–117)
BUN: 30 mg/dL — ABNORMAL HIGH (ref 6–23)
CO2: 23 mEq/L (ref 19–32)
Calcium: 9.9 mg/dL (ref 8.4–10.5)
Chloride: 103 mEq/L (ref 96–112)
Creatinine, Ser: 1 mg/dL (ref 0.40–1.20)
GFR: 56.29 mL/min — ABNORMAL LOW (ref >60.00)
Glucose, Bld: 89 mg/dL (ref 70–99)
Potassium: 4.1 mEq/L (ref 3.5–5.1)
Sodium: 136 mEq/L (ref 135–145)
Total Bilirubin: 0.5 mg/dL (ref 0.2–1.2)
Total Protein: 6 g/dL (ref 6.0–8.3)

## 2021-05-21 LAB — LIPASE: Lipase: 19 U/L (ref 11.0–59.0)

## 2021-05-21 NOTE — Telephone Encounter (Signed)
Refill request for Methocarbamol 500 mg tablets  LOV - 05/20/21 Next OV - not scheduled Last refill - 04/29/21 #40/1

## 2021-05-22 ENCOUNTER — Other Ambulatory Visit: Payer: Medicare Other

## 2021-05-22 DIAGNOSIS — R1011 Right upper quadrant pain: Secondary | ICD-10-CM | POA: Insufficient documentation

## 2021-05-22 MED ORDER — METHOCARBAMOL 500 MG PO TABS: 500.0000 mg | ORAL_TABLET | Freq: Four times a day (QID) | ORAL | 1 refills | Status: DC | PRN

## 2021-05-22 NOTE — Telephone Encounter (Signed)
Sent. Thanks.   

## 2021-05-22 NOTE — Addendum Note (Signed)
Addended by: Tonia Ghent on: 05/22/2021 01:09 PM   Modules accepted: Orders

## 2021-05-22 NOTE — Assessment & Plan Note (Signed)
Discussed options.  No fever.  No jaundice.  Still okay for outpatient follow-up.  Pain is better today/at office visit. Clear liquids in the meantime.  Avoid fatty foods.  If pain is worse, go to ER.  Routine cautions given to patient. We'll call about seeing up an ultrasound.  Labs today.  See notes on labs.  I would try taking Prilosec just to see if that helps at all in the meantime.

## 2021-05-23 ENCOUNTER — Other Ambulatory Visit: Payer: Self-pay

## 2021-05-23 ENCOUNTER — Ambulatory Visit
Admission: RE | Admit: 2021-05-23 | Discharge: 2021-05-23 | Disposition: A | Discharge status: Home / Self Care | Payer: Medicare Other | Source: Ambulatory Visit | Attending: Family Medicine | Admitting: Family Medicine

## 2021-05-23 DIAGNOSIS — N281 Cyst of kidney, acquired: Secondary | ICD-10-CM | POA: Diagnosis not present

## 2021-05-23 DIAGNOSIS — R1011 Right upper quadrant pain: Secondary | ICD-10-CM

## 2021-05-28 ENCOUNTER — Telehealth: Payer: Self-pay | Admitting: Family Medicine

## 2021-05-28 ENCOUNTER — Ambulatory Visit (INDEPENDENT_AMBULATORY_CARE_PROVIDER_SITE_OTHER): Payer: Medicare Other

## 2021-05-28 DIAGNOSIS — E785 Hyperlipidemia, unspecified: Secondary | ICD-10-CM

## 2021-05-28 DIAGNOSIS — I10 Essential (primary) hypertension: Secondary | ICD-10-CM

## 2021-05-28 NOTE — Telephone Encounter (Signed)
Pt called to get US results  

## 2021-05-28 NOTE — Telephone Encounter (Signed)
Patient notified of lab results and verbalized understanding. See results note.   

## 2021-05-28 NOTE — Chronic Care Management (AMB) (Signed)
Chronic Care Management   CCM RN Visit Note  05/28/2021 Name: Morgan Bell MRN: [id number] DOB: 28-Jul-1951  Subjective: Morgan Bell is a 70 y.o. year old female who is a primary care patient of Tonia Ghent, MD. The care management team was consulted for assistance with disease management and care coordination needs.    Engaged with patient face to face for follow up visit in response to provider referral for case management and/or care coordination services.   Consent to Services:  The patient was given information about Chronic Care Management services, agreed to services, and gave verbal consent prior to initiation of services.  Please see initial visit note for detailed documentation.   Patient agreed to services and verbal consent obtained.   Assessment: Review of patient past medical history, allergies, medications, health status, including review of consultants reports, laboratory and other test data, was performed as part of comprehensive evaluation and provision of chronic care management services.   SDOH (Social Determinants of Health) assessments and interventions performed:    CCM Care Plan  Allergies  Allergen Reactions   Sulfa Antibiotics Rash    SULFA DRUGS    Outpatient Encounter Medications as of 05/28/2021  Medication Sig   amLODipine (NORVASC) 5 MG tablet Take 1 tablet (5 mg total) by mouth 2 (two) times daily.   aspirin EC 81 MG tablet Take 1 tablet (81 mg total) by mouth daily. Swallow whole.   calcium-vitamin D (OSCAL WITH D) 500-200 MG-UNIT tablet Take 1 tablet by mouth daily.   cetirizine (ZYRTEC) 10 MG tablet Take 10 mg by mouth daily.   fenofibrate (TRICOR) 145 MG tablet TAKE 1 TABLET BY MOUTH DAILY   gabapentin (NEURONTIN) 300 MG capsule Take 1 capsule (300 mg total) by mouth 3 (three) times daily.   irbesartan (AVAPRO) 300 MG tablet TAKE 1 TABLET BY MOUTH EVERY DAY   leflunomide (ARAVA) 20 MG tablet Take 20 mg daily by mouth.    methocarbamol (ROBAXIN) 500 MG tablet Take 1 tablet (500 mg total) by mouth every 6 (six) hours as needed for muscle spasms.   omeprazole (PRILOSEC) 20 MG capsule Take 1 capsule (20 mg total) by mouth daily.   potassium chloride SA (KLOR-CON) 20 MEQ tablet TAKE 1 TABLET BY MOUTH TWICE DAILY   rosuvastatin (CRESTOR) 40 MG tablet TAKE 1 TABLET BY MOUTH EVERY DAY   spironolactone (ALDACTONE) 25 MG tablet Take 1 tablet (25 mg total) by mouth daily.   Turmeric-Ginger 135-6 MG CHEW Chew 2 tablets by mouth daily.   No facility-administered encounter medications on file as of 05/28/2021.    Patient Active Problem List   Diagnosis Date Noted   RUQ pain 05/22/2021   Advance care planning 05/02/2021   Urinary frequency 05/02/2021   Vertebral compression fracture (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 05/13/2020   Lumbar stenosis with neurogenic claudication 10/18/2019   Exertional angina ([place]) 07/16/2018   Dyslipidemia 10/24/2016   TIA (transient ischemic attack) 01/02/2015   Rheumatoid arthritis ([place]) 01/02/2015   HTN (hypertension) 04/10/2014   Melanoma of trunk ([place]) 01/31/2013    Conditions to be addressed/monitored:HTN and HLD  Care Plan : Cardiovascular  Updates made by Dannielle Karvonen, RN since 05/28/2021 12:00 AM     Problem: Knowledge deficit related to long term care plan for self management of hypertension and hyperlipidemia   Priority: Medium  Long-Range Goal: Hypertension / hyperlipidemia  Monitored and managed   Start Date: 04/16/2021  Expected End Date: 09/11/2021  This Visit's Progress: On track  Recent Progress: On track  Priority: Medium  Note:   Objective:  Last practice recorded BP readings:  BP Readings from Last 3 Encounters:  05/20/21 130/82  04/29/21 128/74  04/22/21 (!) 154/94  Current antihyperlipidemic regimen: Rosuvastatin Most recent lipid panel:      Component Value Date/Time   CHOL 106 05/13/2020 2253   CHOL 120 04/23/2018 0829   TRIG 168 (H) 05/13/2020 2253   HDL 23 (L) 05/13/2020 2253   HDL 17 (L) 04/23/2018 0829   CHOLHDL 4.6 05/13/2020 2253   VLDL 34 05/13/2020 2253   LDLCALC 49 05/13/2020 2253   LDLCALC 41 04/23/2018 0829   LDLDIRECT 57.0 09/06/2018 1242  Current Barriers:  Knowledge Deficits related to basic understanding of hypertension and hyperlipidemia pathophysiology and self care management:  Patient states her cardiologist added spironolactone 25 mg a day to her medication regimen due to her blood pressure being elevated.  She states she only has one blood pressure reading of 140/82 from last week. She reports doing mild exercise at home and working with the home health PT/ OT on exercises.  Unable to provide transportation:  Patient states her son provides transportation to doctor appointments and to run errands.  Case Manager Clinical Goal(s):  patient will verbalize understanding of plan for hypertension / hyperlipidemia  management patient will attend scheduled medical appointments patient will demonstrate improved health management independence as evidenced by checking blood pressure as directed and notifying PCP,  taking all medications as prescribed, and adhering to a low sodium diet as discussed. patient will verbalize basic understanding of hyperlipidemia/ hypertension disease process and self health management plan the patient will work with the care management team towards completion of advanced directives Interventions:  Collaboration with Tonia Ghent, MD regarding development and update of comprehensive plan of care as evidenced by provider attestation and co-signature Inter-disciplinary care team collaboration (see longitudinal plan of care) Assisted patient with initiation or completion of advanced directives (see documentation in [facility]) Reviewed medications with patient and discussed  Provided  patient with educational materials related to hypertension and hyperlipidemia Reviewed scheduled/upcoming provider appointments: Patient saw Dr. Damita Dunnings on 04/29/2021 and will receive her new primary care provider in August 2022.  Discussed plans with patient for ongoing care management follow up and provided patient with direct contact information for care management team Advised patient, providing education and rationale, to monitor blood pressure daily and record, calling PCP for findings outside established parameters. Confirmed with patient she has blood pressure monitor for home use. Advised patient to monitor her blood pressure at least 2-3 times per week and record.  Advance Directive packet mailed to patient.  Advised to call RNCM for questions or concerns.   Patient Goals: - check blood pressure at least 2-3 times per week and record in log/ diary.  - Continue to take your medications as prescribed. Call for medicine refills 2 or 3 days before it runs out - Per your providers recent recommendations:  Adhere to a clear liquid, low fat diet. ( Call provider office for MRI results) - Review education information on heart healthy, low salt diet sent to you in MyChart by your RN case manager.  - Exercise regularly and according to your doctors recommendation Follow Up Plan: The patient has been provided with contact information for the care management team and has been advised  to call with any health related questions or concerns.  The care management team will reach out to the patient again over the next 45 days.          Plan:The patient has been provided with contact information for the care management team and has been advised to call with any health related questions or concerns.  and The care management team will reach out to the patient again over the next 45 days. [name] RN,BSN,CCM RN Case Manager [place]  [phone]

## 2021-05-28 NOTE — Patient Instructions (Signed)
Visit Information:  Thank you for taking the time to speak with me today.   PATIENT GOALS:  Goals Addressed             This Visit's Progress    Lifestyle Change-Hypertension / hyperlipidemia   On track    Timeframe:  Long-Range Goal Priority:  Medium Start Date:    04/16/2021                         Expected End Date:   09/11/2021                   Follow Up Date 07/02/2021 - check blood pressure at least 2-3 times per week and record in log/ diary.  - Continue to take your medications as prescribed. Call for medicine refills 2 or 3 days before it runs out - Per your providers recent recommendations:  Adhere to a clear liquid, low fat diet. ( Call provider office for MRI results) - Review education information on heart healthy, low salt diet sent to you in MyChart by your RN case manager.  - Exercise regularly and according to your doctors recommendation    Why is this important?   The changes that you are asked to make may be hard to do.  This is especially true when the changes are life-long.  Knowing why it is important to you is the first step.  Working on the change with your family or support person helps you not feel alone.  Reward yourself and family or support person when goals are met. This can be an activity you choose like bowling, hiking, biking, swimming or shooting hoops.              The patient verbalized understanding of instructions, educational materials, and care plan provided today and agreed to receive a mailed copy of patient instructions, educational materials, and care plan.  Patient requested After Visit Summary be mailed to her because she doesn't check MyChart often.   The patient has been provided with contact information for the care management team and has been advised to call with any health related questions or concerns.  The care management team will reach out to the patient again over the next 45 days.   [name] RN,BSN,CCM RN Case  Manager [place]  [phone]

## 2021-05-29 DIAGNOSIS — M19011 Primary osteoarthritis, right shoulder: Secondary | ICD-10-CM | POA: Diagnosis not present

## 2021-06-02 ENCOUNTER — Other Ambulatory Visit: Payer: Self-pay | Admitting: Family

## 2021-06-03 MED ORDER — DICYCLOMINE HCL 10 MG PO CAPS: 10.0000 mg | ORAL_CAPSULE | Freq: Three times a day (TID) | ORAL | 1 refills | Status: DC

## 2021-06-03 NOTE — Telephone Encounter (Signed)
Since she had sx with eating, would try bentyl before meals and bedtime if need.  Rx sent. Please update me as needed.  Thanks.

## 2021-06-03 NOTE — Telephone Encounter (Signed)
Morgan Bell called in wanted to know about getting something for pain she is still having pain on right side still.   Please advise

## 2021-06-03 NOTE — Addendum Note (Signed)
Addended by: Tonia Ghent on: 06/03/2021 05:04 PM   Modules accepted: Orders

## 2021-06-04 DIAGNOSIS — I10 Essential (primary) hypertension: Secondary | ICD-10-CM | POA: Diagnosis not present

## 2021-06-04 DIAGNOSIS — R296 Repeated falls: Secondary | ICD-10-CM | POA: Diagnosis not present

## 2021-06-04 DIAGNOSIS — S22069D Unspecified fracture of T7-T8 vertebra, subsequent encounter for fracture with routine healing: Secondary | ICD-10-CM | POA: Diagnosis not present

## 2021-06-04 DIAGNOSIS — F32A Depression, unspecified: Secondary | ICD-10-CM | POA: Diagnosis not present

## 2021-06-04 DIAGNOSIS — S92356D Nondisplaced fracture of fifth metatarsal bone, unspecified foot, subsequent encounter for fracture with routine healing: Secondary | ICD-10-CM | POA: Diagnosis not present

## 2021-06-04 DIAGNOSIS — M069 Rheumatoid arthritis, unspecified: Secondary | ICD-10-CM | POA: Diagnosis not present

## 2021-06-04 DIAGNOSIS — Z8673 Personal history of transient ischemic attack (TIA), and cerebral infarction without residual deficits: Secondary | ICD-10-CM | POA: Diagnosis not present

## 2021-06-04 DIAGNOSIS — Z9181 History of falling: Secondary | ICD-10-CM | POA: Diagnosis not present

## 2021-06-04 DIAGNOSIS — F419 Anxiety disorder, unspecified: Secondary | ICD-10-CM | POA: Diagnosis not present

## 2021-06-04 DIAGNOSIS — M48062 Spinal stenosis, lumbar region with neurogenic claudication: Secondary | ICD-10-CM | POA: Diagnosis not present

## 2021-06-04 DIAGNOSIS — Z7982 Long term (current) use of aspirin: Secondary | ICD-10-CM | POA: Diagnosis not present

## 2021-06-04 DIAGNOSIS — C4359 Malignant melanoma of other part of trunk: Secondary | ICD-10-CM | POA: Diagnosis not present

## 2021-06-04 DIAGNOSIS — E785 Hyperlipidemia, unspecified: Secondary | ICD-10-CM | POA: Diagnosis not present

## 2021-06-04 DIAGNOSIS — S46011D Strain of muscle(s) and tendon(s) of the rotator cuff of right shoulder, subsequent encounter: Secondary | ICD-10-CM | POA: Diagnosis not present

## 2021-06-04 DIAGNOSIS — Z4789 Encounter for other orthopedic aftercare: Secondary | ICD-10-CM | POA: Diagnosis not present

## 2021-06-04 DIAGNOSIS — G43009 Migraine without aura, not intractable, without status migrainosus: Secondary | ICD-10-CM | POA: Diagnosis not present

## 2021-06-04 NOTE — Telephone Encounter (Signed)
Pt called wanting a return call about her [place] results and her still having the pain. She wanted to know what the next step would be

## 2021-06-04 NOTE — Telephone Encounter (Signed)
Spoke with patient; she already knows [place] results. I advised patient that rx was sent and patient will try that and call back if does not help.

## 2021-06-06 ENCOUNTER — Other Ambulatory Visit: Payer: Self-pay | Admitting: Orthopedic Surgery

## 2021-06-06 DIAGNOSIS — M25511 Pain in right shoulder: Secondary | ICD-10-CM

## 2021-06-10 ENCOUNTER — Telehealth: Payer: Self-pay

## 2021-06-10 ENCOUNTER — Telehealth: Payer: Self-pay | Admitting: *Deleted

## 2021-06-10 NOTE — Telephone Encounter (Signed)
Received surgical clearance letter from [facility] re: right total shoulder replacement, no surgery date listed.  Placed on NP's desk for completion.

## 2021-06-10 NOTE — Telephone Encounter (Addendum)
    Patient Name: Morgan Bell  DOB: [birth year] MRN: [id number]  Primary Cardiologist: Skeet Latch, MD  Chart reviewed as part of pre-operative protocol coverage. Patient was last seen by Dr. 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 Patient was contacted today for further pre-op evaluation. Since her last visit, she has had a lumbar spine compression fracture and is currently still in rehab. She is able to ambulate using a walker when supervised by PT but they reportedly do not want her to do this when she is by herself so she is otherwise wheelchair bound. She denies any cardiac symptoms. No chest pain, shortness of breath, orthopnea/PND, palpitations, syncope. She states she has had multiple back surgeries in the last 2 years. However, prior to this, she was able to walk 3 miles every other day. Earlier this year prior to her spinal fracture, she states she was able to ambulate with a cane and walk up an incline/stairs (although very carefully and slowly) with no chest pain. Coronary CTA in 09/2018 showed coronary calcium score of 62.1 (68th percentile for sex and age) and only minimal non-obstructive CAD. Most recent Echo in 05/2020 showed LVEF of 55-60% with normal wall motion and grade 1 diastolic dysfunction. Per Revised Cardiac Risk Index, considered low risk with 0.9% chance of adverse cardiac events perioperatively. Given past medical history and time since last visit, based on ACC/AHA guidelines, ORPAH MCMURDIE would be at acceptable risk for the planned procedure without further cardiovascular testing.   I will route this recommendation to the requesting party via Epic fax function and remove from pre-op pool.  Please call with questions.  Darreld Mclean, PA-C 06/10/2021, 2:38 PM

## 2021-06-10 NOTE — Telephone Encounter (Signed)
Completed and placed in out box.  Thank you.

## 2021-06-10 NOTE — Telephone Encounter (Signed)
   [facility] Pre-operative Risk Assessment    Patient Name: Morgan Bell  DOB: 1951/09/13 MRN: [id number]  HEARTCARE STAFF:  - IMPORTANT!!!!!! Under Visit Info/Reason for Call, type in Other and utilize the format Clearance MM/DD/YY or Clearance TBD. Do not use dashes or single digits. - Please review there is not already an duplicate clearance open for this procedure. - If request is for dental extraction, please clarify the # of teeth to be extracted. - If the patient is currently at the dentist's office, call Pre-Op Callback Staff (MA/nurse) to input urgent request.  - If the patient is not currently in the dentist office, please route to the Pre-Op pool.  Request for surgical clearance:  What type of surgery is being performed? Right Total Shoulder Replacement   When is this surgery scheduled? TBD  What type of clearance is required (medical clearance vs. Pharmacy clearance to hold med vs. Both)? Medical   Are there any medications that need to be held prior to surgery and how long? None   Practice name and name of physician performing surgery? [facility]   What is the office phone number? [phone]   7.   What is the office fax number? [phone]  8.   Anesthesia type (None, local, MAC, general) ? Unknown    Morgan Bell 06/10/2021, 12:30 PM  _________________________________________________________________   (provider comments below)

## 2021-06-10 NOTE — Telephone Encounter (Signed)
Surgical clearance letter completed, signed and faxed to [facility], received confirmation.

## 2021-06-20 ENCOUNTER — Telehealth: Payer: Self-pay | Admitting: Family Medicine

## 2021-06-20 NOTE — Telephone Encounter (Signed)
I had signed the form previously and I thought this was already sent.  Please check on that and resend if needed.  Thanks.

## 2021-06-20 NOTE — Telephone Encounter (Signed)
Mrs. Kotecki called in due to they haven't received clearance from Dr. Damita Dunnings for her to have shoulder surgery, and its delaying the surgery date due to they haven't gotten word from

## 2021-06-25 ENCOUNTER — Ambulatory Visit
Admission: RE | Admit: 2021-06-25 | Discharge: 2021-06-25 | Disposition: A | Discharge status: Home / Self Care | Payer: Medicare Other | Source: Ambulatory Visit | Attending: Orthopedic Surgery | Admitting: Orthopedic Surgery

## 2021-06-25 DIAGNOSIS — S42131A Displaced fracture of coracoid process, right shoulder, initial encounter for closed fracture: Secondary | ICD-10-CM | POA: Diagnosis not present

## 2021-06-25 DIAGNOSIS — Z85820 Personal history of malignant melanoma of skin: Secondary | ICD-10-CM | POA: Diagnosis not present

## 2021-06-25 DIAGNOSIS — S43431A Superior glenoid labrum lesion of right shoulder, initial encounter: Secondary | ICD-10-CM | POA: Diagnosis not present

## 2021-06-25 DIAGNOSIS — M25511 Pain in right shoulder: Secondary | ICD-10-CM

## 2021-06-25 DIAGNOSIS — S43011A Anterior subluxation of right humerus, initial encounter: Secondary | ICD-10-CM | POA: Diagnosis not present

## 2021-06-25 NOTE — Telephone Encounter (Signed)
Form has been faxed.

## 2021-06-25 NOTE — Telephone Encounter (Signed)
I am pretty positive this was done and faxed over and sent to scan; but I do not see anything in the chart scanned in. I found an order scanned in that I just reprinted to be signed. Once signed I will fax back.

## 2021-06-25 NOTE — Telephone Encounter (Signed)
Form signed. Thanks!

## 2021-06-28 ENCOUNTER — Other Ambulatory Visit: Payer: Self-pay | Admitting: *Deleted

## 2021-06-28 ENCOUNTER — Telehealth (HOSPITAL_BASED_OUTPATIENT_CLINIC_OR_DEPARTMENT_OTHER): Payer: Self-pay | Admitting: Cardiovascular Disease

## 2021-06-28 MED ORDER — IRBESARTAN 300 MG PO TABS
300.0000 mg | ORAL_TABLET | Freq: Every day | ORAL | 0 refills | Status: DC
Start: 2021-06-28 — End: 2021-10-09

## 2021-06-28 NOTE — Telephone Encounter (Signed)
06/28/2021  Message received from patient's  [facility] indicating some elevated blood pressures.   I spoke with the patient who denies having any symptoms.  States she is doing ok and her Bp goes up every time Clair Gulling comes. She confirms she is taking her medications as prescribed.   Suggested patient check her Bp at home if she is able.  She confirmed she would be able, however it is difficult and will take her awhile.   Explained to patient, nurse will forward her blood pressures to Dr Oval Linsey for review.  Pt was agreeable to plan. Continues to verbalize feeling "ok."

## 2021-06-28 NOTE — Telephone Encounter (Signed)
Pt c/o BP issue: STAT if pt c/o blurred vision, one-sided weakness or slurred speech  1. What are your last 5 BP readings?  06/28/21 (By Clair Gulling): 146/100 upon arrival.  164/94 with exertion at the end of the PT visit   06/27/21: 152/80 (by other provider) 06/26/21: 146/82 (by other provider)   2. Are you having any other symptoms (ex. Dizziness, headache, blurred vision, passed out)? No. Patient is asymptomatic  3. What is your BP issue? Jim from [facility] was calling to report BP readings. He reports patient is taking medications daily as directed

## 2021-06-28 NOTE — Telephone Encounter (Signed)
Rx(s) sent to pharmacy electronically.  

## 2021-07-02 ENCOUNTER — Ambulatory Visit (INDEPENDENT_AMBULATORY_CARE_PROVIDER_SITE_OTHER): Payer: Medicare Other

## 2021-07-02 DIAGNOSIS — E785 Hyperlipidemia, unspecified: Secondary | ICD-10-CM

## 2021-07-02 DIAGNOSIS — I10 Essential (primary) hypertension: Secondary | ICD-10-CM

## 2021-07-02 NOTE — Patient Instructions (Signed)
Visit Information:  Thank you for taking the time to speak with me today.   PATIENT GOALS:  Goals Addressed             This Visit's Progress    Lifestyle Change-Hypertension / hyperlipidemia   On track    Timeframe:  Long-Range Goal Priority:  Medium Start Date:    04/16/2021                         Expected End Date:   09/11/2021                   Follow Up Date 08/22/2021  - Continue to check blood pressure at least 2-3 times per week and record in log/ diary.  - Continue to take your medications as prescribed. Call for medicine refills 2 or 3 days before it runs out - Per your providers recent recommendations:  Adhere to a clear liquid, low fat diet - Exercise regularly and according to your doctors recommendation    Why is this important?   The changes that you are asked to make may be hard to do.  This is especially true when the changes are life-long.  Knowing why it is important to you is the first step.  Working on the change with your family or support person helps you not feel alone.  Reward yourself and family or support person when goals are met. This can be an activity you choose like bowling, hiking, biking, swimming or shooting hoops.              Patient verbalizes understanding of instructions provided today and agrees to view in [place].   The patient has been provided with contact information for the care management team and has been advised to call with any health related questions or concerns.  The care management team will reach out to the patient again over the next 45 days.   [name] RN,BSN,CCM RN Case Manager [place]  [phone]

## 2021-07-02 NOTE — Chronic Care Management (AMB) (Signed)
Chronic Care Management   CCM RN Visit Note  07/02/2021 Name: Morgan Bell MRN: [id number] DOB: September 08, 1951  Subjective: Morgan Bell is a 70 y.o. year old female who is a primary care patient of Tonia Ghent, MD. The care management team was consulted for assistance with disease management and care coordination needs.    Engaged with patient by telephone for follow up visit in response to provider referral for case management and/or care coordination services.   Consent to Services:  The patient was given information about Chronic Care Management services, agreed to services, and gave verbal consent prior to initiation of services.  Please see initial visit note for detailed documentation.   Patient agreed to services and verbal consent obtained.   Assessment: Review of patient past medical history, allergies, medications, health status, including review of consultants reports, laboratory and other test data, was performed as part of comprehensive evaluation and provision of chronic care management services.   SDOH (Social Determinants of Health) assessments and interventions performed:    CCM Care Plan  Allergies  Allergen Reactions   Sulfa Antibiotics Rash    SULFA DRUGS    Outpatient Encounter Medications as of 07/02/2021  Medication Sig   amLODipine (NORVASC) 5 MG tablet Take 1 tablet (5 mg total) by mouth 2 (two) times daily.   aspirin EC 81 MG tablet Take 1 tablet (81 mg total) by mouth daily. Swallow whole.   calcium-vitamin D (OSCAL WITH D) 500-200 MG-UNIT tablet Take 1 tablet by mouth daily.   cetirizine (ZYRTEC) 10 MG tablet Take 10 mg by mouth daily.   dicyclomine (BENTYL) 10 MG capsule Take 1 capsule (10 mg total) by mouth 4 (four) times daily -  before meals and at bedtime. Use as needed for abdominal pain/cramping.   fenofibrate (TRICOR) 145 MG tablet TAKE 1 TABLET BY MOUTH DAILY   gabapentin (NEURONTIN) 300 MG capsule Take 1 capsule (300 mg total) by mouth  3 (three) times daily.   irbesartan (AVAPRO) 300 MG tablet Take 1 tablet (300 mg total) by mouth daily.   leflunomide (ARAVA) 20 MG tablet Take 20 mg daily by mouth.   methocarbamol (ROBAXIN) 500 MG tablet Take 1 tablet (500 mg total) by mouth every 6 (six) hours as needed for muscle spasms.   omeprazole (PRILOSEC) 20 MG capsule Take 1 capsule (20 mg total) by mouth daily.   potassium chloride SA (KLOR-CON) 20 MEQ tablet TAKE 1 TABLET BY MOUTH TWICE DAILY   rosuvastatin (CRESTOR) 40 MG tablet TAKE 1 TABLET BY MOUTH EVERY DAY   spironolactone (ALDACTONE) 25 MG tablet Take 1 tablet (25 mg total) by mouth daily.   Turmeric-Ginger 135-6 MG CHEW Chew 2 tablets by mouth daily.   No facility-administered encounter medications on file as of 07/02/2021.    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 05/13/2020   Lumbar stenosis with neurogenic claudication 10/18/2019   Exertional angina ([place]) 07/16/2018   Dyslipidemia 10/24/2016   TIA (transient ischemic attack) 01/02/2015   Rheumatoid arthritis ([place]) 01/02/2015   HTN (hypertension) 04/10/2014   Melanoma of trunk ([place]) 01/31/2013    Conditions to be addressed/monitored:HTN and HLD  Care Plan : Cardiovascular  Updates made by [name]  E, RN since 07/02/2021 12:00 AM     Problem: Knowledge deficit related to long term care plan for self management of hypertension and hyperlipidemia   Priority: Medium     Long-Range Goal: Hypertension / hyperlipidemia  Monitored and managed   Start Date: 04/16/2021  Expected End Date: 09/11/2021  This Visit's Progress: On track  Recent Progress: On track  Priority: Medium  Note:   Objective:  Last practice recorded BP readings:   BP Readings from Last 3 Encounters:  05/20/21 130/82  04/29/21 128/74  04/22/21 (!) 154/94  Current antihyperlipidemic regimen: Rosuvastatin Most recent lipid panel:     Component Value Date/Time   CHOL 106 05/13/2020 2253   CHOL 120 04/23/2018 0829   TRIG 168 (H) 05/13/2020 2253   HDL 23 (L) 05/13/2020 2253   HDL 17 (L) 04/23/2018 0829   CHOLHDL 4.6 05/13/2020 2253   VLDL 34 05/13/2020 2253   LDLCALC 49 05/13/2020 2253   LDLCALC 41 04/23/2018 0829   LDLDIRECT 57.0 09/06/2018 1242  Current Barriers:  Knowledge Deficits related to basic understanding of hypertension and hyperlipidemia pathophysiology and self care management:  Patient  reports home blood pressure readings:  153/93, 138/78, 122/89.  She states she continues to do mild home exercises and still receives home PT/OT therapy.  Patient reports her MRI results showed no gallstones. She states due to ongoing abdominal pain she continues on a clear liquid, low fat diet. She said pain is better with cutting back on meal and eating low fat.   Patient denies any new medication changes.  She reports last follow up visit with primary care provider was 06/05/2021.   Unable to provide transportation:  Patient states her son provides transportation to doctor appointments and to run errands.  Case Manager Clinical Goal(s):  patient will verbalize understanding of plan for hypertension / hyperlipidemia  management patient will attend scheduled medical appointments patient will demonstrate improved health management independence as evidenced by checking blood pressure as directed and notifying PCP,  taking all medications as prescribed, and adhering to a low sodium diet as discussed. patient will verbalize basic understanding of hyperlipidemia/ hypertension disease process and self health management plan the patient will work with the care management team towards completion of advanced directives Interventions:  Collaboration with Tonia Ghent, MD regarding development and update of comprehensive plan of care as evidenced by provider attestation and co-signature Inter-disciplinary care team collaboration (see longitudinal plan of care) Reviewed medications with patient and discussed  Reviewed scheduled/upcoming provider appointments: Appointment with neurology on 08/21/2021.   Discussed plans with patient for ongoing care management follow up and provided patient with direct contact information for care management team Advised patient, to continue to monitor blood pressure daily and record, calling PCP for findings outside established parameters.  Patient Goals: - Continue to check blood pressure at least 2-3 times per week and record in log/ diary.  - Continue to take your medications as prescribed. Call for medicine refills 2 or 3 days before it runs out - Per your providers recent recommendations:  Adhere to a clear liquid, low fat diet - Exercise regularly and according to your doctors recommendation Follow Up Plan: The patient has been provided with contact information for the care management team and has been advised to call with any health related questions or concerns.  The care management team will reach out to the patient again over the next 45 days.          Plan:The  patient has been provided with contact information for the care management team and has been advised to call with any health related questions or concerns.  and The care management team will reach out to the patient again over the next 45 days. [name] RN,BSN,CCM RN Case Manager [place]  [phone]

## 2021-07-03 ENCOUNTER — Other Ambulatory Visit: Payer: Self-pay | Admitting: Family Medicine

## 2021-07-03 NOTE — Telephone Encounter (Signed)
Tried to call pt to see if this is an automated refill request or if she really needs the refill as it was just done 06-19-21 #40. VM is not set up.

## 2021-07-04 DIAGNOSIS — R296 Repeated falls: Secondary | ICD-10-CM | POA: Diagnosis not present

## 2021-07-04 DIAGNOSIS — G43009 Migraine without aura, not intractable, without status migrainosus: Secondary | ICD-10-CM | POA: Diagnosis not present

## 2021-07-04 DIAGNOSIS — I10 Essential (primary) hypertension: Secondary | ICD-10-CM | POA: Diagnosis not present

## 2021-07-04 DIAGNOSIS — M069 Rheumatoid arthritis, unspecified: Secondary | ICD-10-CM | POA: Diagnosis not present

## 2021-07-04 DIAGNOSIS — S92356D Nondisplaced fracture of fifth metatarsal bone, unspecified foot, subsequent encounter for fracture with routine healing: Secondary | ICD-10-CM | POA: Diagnosis not present

## 2021-07-04 DIAGNOSIS — M48062 Spinal stenosis, lumbar region with neurogenic claudication: Secondary | ICD-10-CM | POA: Diagnosis not present

## 2021-07-04 DIAGNOSIS — Z9181 History of falling: Secondary | ICD-10-CM | POA: Diagnosis not present

## 2021-07-04 DIAGNOSIS — F419 Anxiety disorder, unspecified: Secondary | ICD-10-CM | POA: Diagnosis not present

## 2021-07-04 DIAGNOSIS — S46011D Strain of muscle(s) and tendon(s) of the rotator cuff of right shoulder, subsequent encounter: Secondary | ICD-10-CM | POA: Diagnosis not present

## 2021-07-04 DIAGNOSIS — Z4789 Encounter for other orthopedic aftercare: Secondary | ICD-10-CM | POA: Diagnosis not present

## 2021-07-04 DIAGNOSIS — C4359 Malignant melanoma of other part of trunk: Secondary | ICD-10-CM | POA: Diagnosis not present

## 2021-07-04 DIAGNOSIS — Z8673 Personal history of transient ischemic attack (TIA), and cerebral infarction without residual deficits: Secondary | ICD-10-CM | POA: Diagnosis not present

## 2021-07-04 DIAGNOSIS — Z7982 Long term (current) use of aspirin: Secondary | ICD-10-CM | POA: Diagnosis not present

## 2021-07-04 DIAGNOSIS — S22069D Unspecified fracture of T7-T8 vertebra, subsequent encounter for fracture with routine healing: Secondary | ICD-10-CM | POA: Diagnosis not present

## 2021-07-04 DIAGNOSIS — F32A Depression, unspecified: Secondary | ICD-10-CM | POA: Diagnosis not present

## 2021-07-04 DIAGNOSIS — E785 Hyperlipidemia, unspecified: Secondary | ICD-10-CM | POA: Diagnosis not present

## 2021-07-05 DIAGNOSIS — Z79899 Other long term (current) drug therapy: Secondary | ICD-10-CM | POA: Diagnosis not present

## 2021-07-05 DIAGNOSIS — M5136 Other intervertebral disc degeneration, lumbar region: Secondary | ICD-10-CM | POA: Diagnosis not present

## 2021-07-05 DIAGNOSIS — M150 Primary generalized (osteo)arthritis: Secondary | ICD-10-CM | POA: Diagnosis not present

## 2021-07-05 DIAGNOSIS — M0589 Other rheumatoid arthritis with rheumatoid factor of multiple sites: Secondary | ICD-10-CM | POA: Diagnosis not present

## 2021-07-05 NOTE — Telephone Encounter (Signed)
Pt called in stated she needs refill . Pt is out of medication   Encourage patient to contact the pharmacy for refills or they can request refills through [place]:  Please schedule appointment if longer than 1 year  NEXT APPOINTMENT DATE:  MEDICATION:dicyclomine (BENTYL) 10 MG capsule  Is the patient out of medication?   [place],   Let patient know to contact pharmacy at the end of the day to make sure medication is ready.  Please notify patient to allow 48-72 hours to process  CLINICAL FILLS OUT ALL BELOW:   LAST REFILL:  QTY:  REFILL DATE:    OTHER COMMENTS:    Okay for refill?  Please advise

## 2021-07-12 ENCOUNTER — Other Ambulatory Visit: Payer: Self-pay | Admitting: Family

## 2021-07-12 DIAGNOSIS — I10 Essential (primary) hypertension: Secondary | ICD-10-CM | POA: Diagnosis not present

## 2021-07-12 DIAGNOSIS — E785 Hyperlipidemia, unspecified: Secondary | ICD-10-CM

## 2021-07-16 ENCOUNTER — Telehealth: Payer: Self-pay | Admitting: Cardiovascular Disease

## 2021-07-16 MED ORDER — ROSUVASTATIN CALCIUM 40 MG PO TABS: 40.0000 mg | ORAL_TABLET | Freq: Every day | ORAL | 1 refills | Status: DC

## 2021-07-16 NOTE — Telephone Encounter (Signed)
Rx(s) sent to pharmacy electronically.  

## 2021-07-16 NOTE — Telephone Encounter (Signed)
*  STAT* If patient is at the pharmacy, call can be transferred to refill team.   1. Which medications need to be refilled? (please list name of each medication and dose if known) rosuvastatin (CRESTOR) 40 MG tablet  2. Which pharmacy/location (including street and city if local pharmacy) is medication to be sent to? [facility] [address], [place]  3. Do they need a 30 day or 90 day supply?  90 day supply    Case #: [id number]

## 2021-07-18 ENCOUNTER — Other Ambulatory Visit: Payer: Self-pay

## 2021-07-18 MED ORDER — POTASSIUM CHLORIDE CRYS ER 20 MEQ PO TBCR: 20.0000 meq | EXTENDED_RELEASE_TABLET | Freq: Two times a day (BID) | ORAL | 1 refills | Status: DC

## 2021-07-19 ENCOUNTER — Telehealth: Payer: Self-pay

## 2021-07-19 ENCOUNTER — Ambulatory Visit (INDEPENDENT_AMBULATORY_CARE_PROVIDER_SITE_OTHER): Payer: Medicare Other

## 2021-07-19 DIAGNOSIS — E785 Hyperlipidemia, unspecified: Secondary | ICD-10-CM

## 2021-07-19 DIAGNOSIS — I10 Essential (primary) hypertension: Secondary | ICD-10-CM

## 2021-07-19 NOTE — Patient Instructions (Addendum)
Visit Information"  Thank you for taking the time to speak with me today.   PATIENT GOALS:  Goals Addressed             This Visit's Progress    Lifestyle Change-Hypertension / hyperlipidemia   On track    Timeframe:  Long-Range Goal Priority:  Medium Start Date:    04/16/2021                         Expected End Date:   10/11/2021                 Follow Up Date 08/22/2021 - Call your primary care provider office today to report ongoing symptoms of right side abdominal pain. - Continue to check blood pressure at least 2-3 times per week and record in log/ diary.  - Continue to take your medications as prescribed. Call for medicine refills 2 or 3 days before it runs out - Per your providers recent recommendations:  Adhere to a clear liquid, low fat diet - Exercise regularly and according to your doctors recommendation    Why is this important?   The changes that you are asked to make may be hard to do.  This is especially true when the changes are life-long.  Knowing why it is important to you is the first step.  Working on the change with your family or support person helps you not feel alone.  Reward yourself and family or support person when goals are met. This can be an activity you choose like bowling, hiking, biking, swimming or shooting hoops.              Patient verbalizes understanding of instructions provided today and agrees to view in [place].   The patient has been provided with contact information for the care management team and has been advised to call with any health related questions or concerns.  The care management team will reach out to the patient again over the next 1-2 months.   [name] RN,BSN,CCM RN Case Manager [place]  [phone]

## 2021-07-19 NOTE — Telephone Encounter (Signed)
Morgan Bell sent me note that access nurse had advised pt to go to ED due to abd pain and pt refused. No access nurse note yet. I spoke with pt; abd pain on going since last seeing DR Morgan Bell about 1 month ago.  Pt said not hurting as badly but the abd pain is still there. Pt said the abd pain is a sharp pain on and off on rt side across from belly button. Right now pt is not having any abd pain with pain level of 0 . Pt said she called to get get advice from Dr Morgan Bell of what can do to get rid of this pain. Pt is tired of hurting. Pt said she has been eating a low fat diet but has not been able to exercise due to back issue. Pt said she goes from w/c to walker. UC & ED precautions given and pt voiced  understanding. Pt will wait for cb after note reviewed by Dr Morgan Bell and pt understands it may be next wk when gets cb. Sending note to DR Morgan Bell and Janett Billow CMA.

## 2021-07-19 NOTE — Chronic Care Management (AMB) (Signed)
Chronic Care Management   CCM RN Visit Note  07/19/2021 Name: Morgan Bell MRN: [id number] DOB: Jul 16, 1951  Subjective: Morgan Bell is a 70 y.o. year old female who is a primary care patient of Tonia Ghent, MD. The care management team was consulted for assistance with disease management and care coordination needs.    Engaged with patient by telephone for follow up visit in response to provider referral for case management and/or care coordination services.   Consent to Services:  The patient was given the following information about Chronic Care Management services today, agreed to services, and gave verbal consent: 1. CCM service includes personalized support from designated clinical staff supervised by the primary care provider, including individualized plan of care and coordination with other care providers 2. 24/7 contact phone numbers for assistance for urgent and routine care needs. 3. Service will only be billed when office clinical staff spend 20 minutes or more in a month to coordinate care. 4. Only one practitioner may furnish and bill the service in a calendar month. 5.The patient may stop CCM services at any time (effective at the end of the month) by phone call to the office staff. 6. The patient will be responsible for cost sharing (co-pay) of up to 20% of the service fee (after annual deductible is met). Patient agreed to services and consent obtained.  Patient agreed to services and verbal consent obtained.   Assessment: Review of patient past medical history, allergies, medications, health status, including review of consultants reports, laboratory and other test data, was performed as part of comprehensive evaluation and provision of chronic care management services.   SDOH (Social Determinants of Health) assessments and interventions performed:    CCM Care Plan  Allergies  Allergen Reactions   Sulfa Antibiotics Rash    SULFA DRUGS    Outpatient Encounter  Medications as of 07/19/2021  Medication Sig   amLODipine (NORVASC) 5 MG tablet Take 1 tablet (5 mg total) by mouth 2 (two) times daily.   aspirin EC 81 MG tablet Take 1 tablet (81 mg total) by mouth daily. Swallow whole.   calcium-vitamin D (OSCAL WITH D) 500-200 MG-UNIT tablet Take 1 tablet by mouth daily.   cetirizine (ZYRTEC) 10 MG tablet Take 10 mg by mouth daily.   dicyclomine (BENTYL) 10 MG capsule TAKE 1 CAPSULE BY MOUTH FOUR TIMES DAILY BEFORE MEALS AND AT BEDTIME. USE AS NEEDED FOR ABDOMINAL PAIN/CRAMPING   fenofibrate (TRICOR) 145 MG tablet TAKE 1 TABLET BY MOUTH DAILY   gabapentin (NEURONTIN) 300 MG capsule Take 1 capsule (300 mg total) by mouth 3 (three) times daily.   irbesartan (AVAPRO) 300 MG tablet Take 1 tablet (300 mg total) by mouth daily.   leflunomide (ARAVA) 20 MG tablet Take 20 mg daily by mouth.   methocarbamol (ROBAXIN) 500 MG tablet Take 1 tablet (500 mg total) by mouth every 6 (six) hours as needed for muscle spasms.   omeprazole (PRILOSEC) 20 MG capsule Take 1 capsule (20 mg total) by mouth daily.   potassium chloride SA (KLOR-CON) 20 MEQ tablet Take 1 tablet (20 mEq total) by mouth 2 (two) times daily.   rosuvastatin (CRESTOR) 40 MG tablet Take 1 tablet (40 mg total) by mouth daily.   spironolactone (ALDACTONE) 25 MG tablet Take 1 tablet (25 mg total) by mouth daily.   Turmeric-Ginger 135-6 MG CHEW Chew 2 tablets by mouth daily.   No facility-administered encounter medications on file as of 07/19/2021.    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 05/13/2020   Lumbar stenosis with neurogenic claudication 10/18/2019   Exertional angina ([place]) 07/16/2018   Dyslipidemia 10/24/2016   TIA  (transient ischemic attack) 01/02/2015   Rheumatoid arthritis ([place]) 01/02/2015   HTN (hypertension) 04/10/2014   Melanoma of trunk ([place]) 01/31/2013    Conditions to be addressed/monitored:HTN and HLD  Care Plan : Cardiovascular  Updates made by Dannielle Karvonen, RN since 07/19/2021 12:00 AM     Problem: Knowledge deficit related to long term care plan for self management of hypertension and hyperlipidemia   Priority: Medium     Long-Range Goal: Hypertension / hyperlipidemia  Monitored and managed   Start Date: 04/16/2021  Expected End Date: 09/11/2021  Recent Progress: On track  Priority: Medium  Note:   Objective:  Last practice recorded BP readings:  BP Readings from Last 3 Encounters:  05/20/21 130/82  04/29/21 128/74  04/22/21 (!) 154/94  Current antihyperlipidemic regimen: Rosuvastatin Most recent lipid panel:     Component Value Date/Time   CHOL 106 05/13/2020 2253   CHOL 120 04/23/2018 0829   TRIG 168 (H) 05/13/2020 2253   HDL 23 (L) 05/13/2020 2253   HDL 17 (L) 04/23/2018 0829   CHOLHDL 4.6 05/13/2020 2253   VLDL 34 05/13/2020 2253   LDLCALC 49 05/13/2020 2253   LDLCALC 41 04/23/2018 0829   LDLDIRECT 57.0 09/06/2018 1242  Current Barriers:  Knowledge Deficits related to basic understanding of hypertension and hyperlipidemia pathophysiology and self care management:  Patient she continues to check her blood pressures regularly. She states her most recent blood pressure was 128/78.  Patient states she is unable to report the additional blood pressure readings due to misplacing the paper she recorded them on .  Patient state she continues to take her medication as prescribed Patient state she continues to have pain on her right side near her abdomen. She states TUM is the only thing that seem to help releave the discomfort. Patient states she has not reported the ongoing symptoms to her doctor.  Per chart review, last follow up visit with primary care provider was  05/20/2021.  RNCM advised patient to call her PCP office today to reports symptoms.  Advised to go to ED/ call 911 for severe symptoms.  Patient verbalized understanding. Patient states she continues to have home physical therapy 1 x per week and remains on a low fat diet.     Unable to provide transportation:   Case Manager Clinical Goal(s):  patient will verbalize understanding of plan for hypertension / hyperlipidemia  management patient will attend scheduled medical appointments patient will demonstrate improved health management independence as evidenced by checking blood pressure as directed and notifying PCP,  taking all medications as prescribed, and adhering to a low sodium diet as discussed. patient will verbalize basic understanding of hyperlipidemia/ hypertension disease process and self health management plan the patient will work with the care management team towards completion of advanced directives Interventions:  Collaboration with Tonia Ghent, MD regarding development and update of comprehensive plan of care as evidenced by provider attestation and co-signature Inter-disciplinary care team collaboration (see longitudinal plan of care) Reviewed medications with patient and discussed  Reviewed scheduled/upcoming provider appointments:  Appointment with neurology on 08/21/2021.   Discussed plans with patient for ongoing care management follow up and provided patient with direct contact information for care management team Advised patient, to continue to monitor blood pressure daily and record, calling PCP for findings outside established parameters.  Patient Goals: - Call your primary care provider office today to report ongoing symptoms of right side abdominal pain.  - Continue to check blood pressure at least 2-3 times per week and record in log/ diary.  - Continue to take your medications as prescribed. Call for medicine refills 2 or 3 days before it runs out - Continue to  adhere to diet recommendations ( low fat diet) - Exercise regularly and according to your doctors recommendation Follow Up Plan: The patient has been provided with contact information for the care management team and has been advised to call with any health related questions or concerns.  The care management team will reach out to the patient again over the next 1-2 months.          Plan:The patient has been provided with contact information for the care management team and has been advised to call with any health related questions or concerns.  and The care management team will reach out to the patient again over the next 1-2 months  [name] RN,BSN,CCM RN Case Manager [place]  [phone]

## 2021-07-21 NOTE — Telephone Encounter (Signed)
Can she come in for recheck?  If she is still having symptoms, then that likely makes sense.  Thanks.

## 2021-07-22 NOTE — Telephone Encounter (Signed)
PLEASE NOTE: All timestamps contained within this report are represented as [place] Standard Time. CONFIDENTIALTY NOTICE: This fax transmission is intended only for the addressee. It contains information that is legally privileged, confidential or otherwise protected from use or disclosure. If you are not the intended recipient, you are strictly prohibited from reviewing, disclosing, copying using or disseminating any of this information or taking any action in reliance on or regarding this information. If you have received this fax in error, please notify us immediately by telephone so that we can arrange for its return to [place]. Phone: [phone], Toll-Free: [phone], Fax: [phone] Page: 1 of 2 Call Id: [id number] [place] [facility] - Client TELEPHONE ADVICE RECORD [facility] Patient Name: Morgan Bell ADLEY Gender: Female DOB: 1951/07/04 Age: 70 Y 70 M 1 D Return Phone Number: [phone] (Primary) Address: City/ State/ Zip: [place] Client [place] [facility] - Client Client Site [facility] Physician Renford Dills - MD Contact Type Call Who Is Calling Patient / Member / Family / Caregiver Call Type Triage / Clinical Relationship To Patient Self Return Phone Number [phone] (Primary) Chief Complaint Flank Pain Reason for Call Symptomatic / Request for [place] states she has right side pain. Additional Comment Caller disconnected from office before being transferred - unable to verify info with pt. Translation No Nurse Assessment Nurse: Files, RN, Dustin Date/Time (Eastern Time): 07/19/2021 4:38:35 PM Confirm and document reason for call. If symptomatic, describe symptoms. ---Caller states she had right side pain since she been to the doctor office, he did a CT scan gave her some pills and it help somewhat, taking anti acid helps the pain, pain 0/10 at the moment because  she is laying down, it hurts in her side when sitting in a wheel chair. Does the patient have any new or worsening symptoms? ---Yes Will a triage be completed? ---Yes Related visit to physician within the last 2 weeks? ---Yes Does the PT have any chronic conditions? (i.e. diabetes, asthma, this includes High risk factors for pregnancy, etc.) ---Yes List chronic conditions. ---multiple back surgeries, HTN Is this a behavioral health or substance abuse call? ---No Guidelines Guideline Title Affirmed Question Affirmed Notes Nurse Date/Time (Eastern Time) Flank Pain [1] Abdominal pain AND [2] age > 41 years Files, RN, Rachel Bo 07/19/2021 4:42:58 PM Disp. Time Eilene Ghazi Time) Disposition Final User 07/19/2021 4:19:35 PM Attempt made - no message left Files, RN, Rachel Bo PLEASE NOTE: All timestamps contained within this report are represented as [place] Standard Time. CONFIDENTIALTY NOTICE: This fax transmission is intended only for the addressee. It contains information that is legally privileged, confidential or otherwise protected from use or disclosure. If you are not the intended recipient, you are strictly prohibited from reviewing, disclosing, copying using or disseminating any of this information or taking any action in reliance on or regarding this information. If you have received this fax in error, please notify us immediately by telephone so that we can arrange for its return to [place]. Phone: [phone], Toll-Free: [phone], Fax: [phone] Page: 2 of 2 Call Id: [id number] 07/19/2021 4:48:37 PM Go to ED Now Yes Files, RN, York Grice Disagree/Comply Disagree Caller Understands Yes PreDisposition InappropriateToAsk Care Advice Given Per Guideline GO TO ED NOW: * You need to be seen in the Emergency Department. * Go to the ED at ___________ [place]: * It is better and safer if another adult drives instead of you. CARE ADVICE  given per Flank  Pain (Adult) guideline. BRING MEDICINES: * Bring the pill bottles too. This will help the doctor (or NP/PA) to make certain you are taking the right medicines and the right dose. * Bring a list of your current medicines when you go to the Emergency Department (ER). * Leave now. Drive carefully. Comments User: Aquilla Solian, RN Date/Time Eilene Ghazi Time): 07/19/2021 4:48:33 PM Caller disagreed with the outcome, called back line per triage orders, they stated they would call the patient Referrals REFERRED TO PCP OFFICE

## 2021-07-22 NOTE — Telephone Encounter (Signed)
Tried to call patient and unable to leave a message because voicemail has not yet been set up. Will have to try and call patient back later

## 2021-07-25 ENCOUNTER — Telehealth: Payer: Self-pay | Admitting: Family Medicine

## 2021-07-25 NOTE — Telephone Encounter (Signed)
Jim from [facility]  stated pt blood pressure is 148/100, no cardiac symptoms she has chronic right side belly pain ranging between two out of ten. It is now 2 yesterday was five pain is worse and stomach is  rumbling . After pt passes gas pain is reduced  Clair Gulling number is [phone]

## 2021-07-25 NOTE — Telephone Encounter (Addendum)
Pt had appt scheduled for 07/30/21; I spoke with pt and she changed appt with Dr Damita Dunnings to 07/26/21 at 10 AM. Confirmed [place] office.UC & ED precautions given and pt voiced understanding.Sending note to Dr Damita Dunnings and Janett Billow CMA.also send teams to [place].

## 2021-07-25 NOTE — Telephone Encounter (Signed)
[place] [facility] - Client TELEPHONE ADVICE RECORD [facility] Patient Name: Morgan Bell Gender: Female DOB: [birth year] Age: 70 Y 10 M 7 D Return Phone Number: [phone] (Primary) Address: City/ State/ Zip: [place]  [address] Client [place] [facility] - Client Client Site [facility] Physician Renford Dills - MD Contact Type Call Who Is Calling Patient / Member / Family / Caregiver Call Type Triage / Clinical Relationship To Patient Self Return Phone Number [phone] (Primary) Chief Complaint Flank Pain Reason for Call Symptomatic / Request for [place] states her name is emily trying to schedule a triage appointment for her patient there that is having right sided pain of unknown origin. Translation No Nurse Assessment Nurse: D'Heur Lucia Gaskins, RN, Adrienne Date/Time (Eastern Time): 07/25/2021 2:11:17 PM Confirm and document reason for call. If symptomatic, describe symptoms. ---Caller states she is having right sided pain of unknown origin. She had a CT scan about three months ago for same symptom. It got better initially but is now worse. No fever, no other s/s. Does the patient have any new or worsening symptoms? ---Yes Will a triage be completed? ---Yes Related visit to physician within the last 2 weeks? ---No Does the PT have any chronic conditions? (i.e. diabetes, asthma, this includes High risk factors for pregnancy, etc.) ---Yes List chronic conditions. ---HTN Is this a behavioral health or substance abuse call? ---No Guidelines Guideline Title Affirmed Question Affirmed Notes Nurse Date/Time Eilene Ghazi Time) Flank Pain MODERATE pain (e.g., interferes with normal activities or awakens from sleep) [place], RN, [place] 07/25/2021 2:14:46 PM Disp. Time Eilene Ghazi Time) Disposition Final User 07/25/2021 2:18:43 PM See PCP within 24 Hours  Yes D'Heur Lucia Gaskins, RN, Vincente Liberty PLEASE NOTE: All timestamps contained within this report are represented as [place] Standard Time. CONFIDENTIALTY NOTICE: This fax transmission is intended only for the addressee. It contains information that is legally privileged, confidential or otherwise protected from use or disclosure. If you are not the intended recipient, you are strictly prohibited from reviewing, disclosing, copying using or disseminating any of this information or taking any action in reliance on or regarding this information. If you have received this fax in error, please notify us immediately by telephone so that we can arrange for its return to [place]. Phone: [phone], Toll-Free: [phone], Fax: [phone] Page: 2 of 2 Call Id: [id number] [place] Disagree/Comply Comply Caller Understands Yes PreDisposition Call Doctor Care Advice Given Per Guideline SEE PCP WITHIN 24 HOURS: * IF OFFICE WILL BE OPEN: You need to be examined within the next 24 hours. Call your doctor (or NP/PA) when the office opens and make an appointment. PAIN MEDICINES: * For pain relief, you can take either acetaminophen, ibuprofen, or naproxen. CALL BACK IF: * Fever over 100.4 F (38.0 C) * You become worse CARE ADVICE given per Flank Pain (Adult) guideline. Comments User: Vincente Liberty, D'Heur Lucia Gaskins, RN Date/Time Eilene Ghazi Time): 07/25/2021 2:14:12 PM Passing gas relieves the pain. She is having a lot of gas. User: Vincente Liberty, D'Heur Lucia Gaskins, RN Date/Time Eilene Ghazi Time): 07/25/2021 2:15:19 PM Caller has had bsck sx (x4) and a stroke. User: Vincente Liberty, D'Heur Lucia Gaskins, RN Date/Time Eilene Ghazi Time): 07/25/2021 2:16:51 PM Pain is intermittent and stabbing, between moderate to severe. Lying down makes it better. User: Vincente Liberty, D'Heur Lucia Gaskins, RN Date/Time Eilene Ghazi Time): 07/25/2021 2:21:56 PM Warm transferred to office for appointment. Referrals REFERRED TO PCP OFFIC

## 2021-07-26 ENCOUNTER — Telehealth: Payer: Self-pay | Admitting: Family Medicine

## 2021-07-26 ENCOUNTER — Encounter: Payer: Self-pay | Admitting: Family Medicine

## 2021-07-26 ENCOUNTER — Other Ambulatory Visit: Payer: Self-pay

## 2021-07-26 ENCOUNTER — Ambulatory Visit (INDEPENDENT_AMBULATORY_CARE_PROVIDER_SITE_OTHER): Payer: Medicare Other | Admitting: Family Medicine

## 2021-07-26 VITALS — BP 120/84 | HR 93 | Temp 97.8°F | Ht 60.0 in | Wt 135.0 lb

## 2021-07-26 DIAGNOSIS — R1011 Right upper quadrant pain: Secondary | ICD-10-CM | POA: Diagnosis not present

## 2021-07-26 MED ORDER — DICYCLOMINE HCL 10 MG PO CAPS: ORAL_CAPSULE | ORAL | 1 refills | Status: DC

## 2021-07-26 NOTE — Patient Instructions (Signed)
Keep using dicyclomine and avoid fatty foods.   We'll call about getting the extra gallbladder test set up, to see how well your gall bladder is functioning.  It could be causing the pain without having stones.  Take care.  Glad to see you.

## 2021-07-26 NOTE — Telephone Encounter (Signed)
Noted. Thanks.

## 2021-07-26 NOTE — Progress Notes (Signed)
This visit occurred during the SARS-CoV-2 public health emergency.  Safety protocols were in place, including screening questions prior to the visit, additional usage of staff PPE, and extensive cleaning of exam room while observing appropriate contact time as indicated for disinfecting solutions.  R sided abd pain.  Weight is stable.  Dicyclomine helped some but not complete relief.  Pain is intermittent.  Some days are better than others.  She had pain at check in but not now.  She has had several days in a row w/o pain previously.  When she had an improvement for a few days, she was able to stop dicyclomine.    This is the same pain as prev OV noted.  Pain isn't worse than prev but is more often.  More pain with eating.  Less sx with eating pasta w/o sauce, more sx when adding sauce.  Fatty foods make the pain worse.    She put her shoulder surgery off for now. Her brother in law recently needed bypass, so she put her surgery off.    Prev U/s without gallstones but increased echogenicity of hepatic parenchyma is noted suggesting hepatic steatosis.  Two right renal cysts are noted, the largest measuring 9.4 cm.  BM last night, not black or bloody.  No fevers.  No vomiting.    Meds, vitals, and allergies reviewed.   ROS: Per HPI unless specifically indicated in ROS section   Nad Ncat Neck supple, no LA Rrr Ctab Abd soft, RUQ mildly ttp w/o rebound.  Normal BS.  Abd not ttp o/w.   Skin well perfused. No jaundice.

## 2021-07-26 NOTE — Telephone Encounter (Signed)
Opened in error

## 2021-07-28 NOTE — Assessment & Plan Note (Addendum)
Ongoing, needs further work-up, previous imaging discussed with patient.  No jaundice.  No fever.  Still okay for outpatient follow-up.  Discussed previous imaging.  She could have biliary dyskinesia and it makes sense to avoid fatty foods in the meantime, use dicyclomine if needed, and set up hepatobiliary scan.  Anatomy and rationale for testing discussed with patient.  Routine cautions given to patient.  See after visit summary.  She agrees with plan.

## 2021-07-30 ENCOUNTER — Ambulatory Visit: Payer: Medicare Other | Admitting: Family Medicine

## 2021-07-31 ENCOUNTER — Telehealth: Payer: Self-pay | Admitting: Family Medicine

## 2021-07-31 ENCOUNTER — Ambulatory Visit (HOSPITAL_COMMUNITY): Payer: Medicare Other

## 2021-07-31 NOTE — Telephone Encounter (Signed)
Morgan Bell with [facility] Bell called to report the following  1-Pt missed appt for Gallbladder scan this morning it is reschedule for 08/06/21 2- pt pain is getting worse 10-10 3- BP 128/98 with no pain at the time of reading  4- she will be discharged from home health because she has met the max sessions

## 2021-08-01 NOTE — Telephone Encounter (Addendum)
I will await the imaging report now that it has been rescheduled.  If she has 10 out of 10 pain that she needs to go to the emergency room.  The note states that she is not having pain at the time of BP measurement.  Thanks.

## 2021-08-01 NOTE — Telephone Encounter (Signed)
Called patient she will keep appointment for scan. She is not currently having any pain at this time if comes back and is 10/10 will go to ED.

## 2021-08-01 NOTE — Telephone Encounter (Signed)
Noted. Thanks.

## 2021-08-06 ENCOUNTER — Other Ambulatory Visit: Payer: Self-pay

## 2021-08-06 ENCOUNTER — Ambulatory Visit (HOSPITAL_COMMUNITY)
Admission: RE | Admit: 2021-08-06 | Discharge: 2021-08-06 | Disposition: A | Discharge status: Home / Self Care | Payer: Medicare Other | Source: Ambulatory Visit | Attending: Family Medicine | Admitting: Family Medicine

## 2021-08-06 DIAGNOSIS — R1011 Right upper quadrant pain: Secondary | ICD-10-CM | POA: Insufficient documentation

## 2021-08-06 MED ORDER — TECHNETIUM TC 99M MEBROFENIN IV KIT
5.3000 | PACK | Freq: Once | INTRAVENOUS | Status: AC | PRN
  Administered 2021-08-06: 5.3 via INTRAVENOUS

## 2021-08-07 DIAGNOSIS — Z4789 Encounter for other orthopedic aftercare: Secondary | ICD-10-CM | POA: Diagnosis not present

## 2021-08-07 DIAGNOSIS — S22069D Unspecified fracture of T7-T8 vertebra, subsequent encounter for fracture with routine healing: Secondary | ICD-10-CM | POA: Diagnosis not present

## 2021-08-09 ENCOUNTER — Other Ambulatory Visit: Payer: Self-pay | Admitting: Family Medicine

## 2021-08-09 ENCOUNTER — Telehealth: Payer: Self-pay | Admitting: Family Medicine

## 2021-08-09 NOTE — Telephone Encounter (Signed)
Pt wants to know the results of a procedure she had on Tuesday at the hospital. Pt wants a call back

## 2021-08-12 ENCOUNTER — Other Ambulatory Visit: Payer: Self-pay | Admitting: Family Medicine

## 2021-08-12 DIAGNOSIS — I10 Essential (primary) hypertension: Secondary | ICD-10-CM

## 2021-08-12 DIAGNOSIS — E785 Hyperlipidemia, unspecified: Secondary | ICD-10-CM

## 2021-08-12 DIAGNOSIS — R109 Unspecified abdominal pain: Secondary | ICD-10-CM

## 2021-08-12 NOTE — Telephone Encounter (Signed)
Patient was given results this morning; see result note.

## 2021-08-16 ENCOUNTER — Other Ambulatory Visit: Payer: Self-pay | Admitting: Family Medicine

## 2021-08-16 ENCOUNTER — Ambulatory Visit (INDEPENDENT_AMBULATORY_CARE_PROVIDER_SITE_OTHER)
Admission: RE | Admit: 2021-08-16 | Discharge: 2021-08-16 | Disposition: A | Discharge status: Home / Self Care | Payer: Medicare Other | Source: Ambulatory Visit | Attending: Family Medicine | Admitting: Family Medicine

## 2021-08-16 ENCOUNTER — Other Ambulatory Visit: Payer: Self-pay

## 2021-08-16 ENCOUNTER — Ambulatory Visit (INDEPENDENT_AMBULATORY_CARE_PROVIDER_SITE_OTHER): Payer: Medicare Other | Admitting: Family Medicine

## 2021-08-16 ENCOUNTER — Encounter: Payer: Self-pay | Admitting: Family Medicine

## 2021-08-16 VITALS — BP 130/80 | HR 102 | Temp 97.9°F | Ht 60.0 in | Wt 133.0 lb

## 2021-08-16 DIAGNOSIS — M545 Low back pain, unspecified: Secondary | ICD-10-CM

## 2021-08-16 DIAGNOSIS — Z23 Encounter for immunization: Secondary | ICD-10-CM

## 2021-08-16 DIAGNOSIS — M549 Dorsalgia, unspecified: Secondary | ICD-10-CM | POA: Diagnosis not present

## 2021-08-16 MED ORDER — ACETAMINOPHEN 500 MG PO TABS: 500.0000 mg | ORAL_TABLET | Freq: Three times a day (TID) | ORAL | Status: AC | PRN

## 2021-08-16 NOTE — Progress Notes (Signed)
This visit occurred during the SARS-CoV-2 public health emergency.  Safety protocols were in place, including screening questions prior to the visit, additional usage of staff PPE, and extensive cleaning of exam room while observing appropriate contact time as indicated for disinfecting solutions.  R sided abd pain.  She still has some pain but more on the R side of the back and not the RUQ now.  No L sided sx.  Sx are better, not worse. No rash.  No change in skin sensation.  Not a superficial pain on the skin.  Leaning to R side helps, laying back helps.  Sitting up makes it worse.  More pain leaning to L side.  Still overall less severe than prev. Better after a BM.  GI referral prev placed.   No pain in the legs.  Heating pad helps.    Prev MRI LUMBAR SPINE IMPRESSION: 1. Late subacute compression fracture at the superior endplate of L5 with mild 25% height loss and 4 mm bony retropulsion.  2. No other acute abnormality within the lumbar spine. No evidence for cord compression.  3. Sequelae of prior posterior decompression at fusion at T11 through L4 with underlying multilevel degenerative spondylosis as above. Resultant moderate spinal stenosis at L4-5, with moderate bilateral L4 and L5 foraminal narrowing.   She is taking dicyclomine at baseline.  She is taking extra strength tylenol QID, d/w pt about taking tylenol TID.    Meds, vitals, and allergies reviewed.   ROS: Per HPI unless specifically indicated in ROS section   Nad Ncat Neck supple, no LA Rrr Ctab Abd soft, not ttp, no right upper quadrant pain. R lower T spine area ttp w/o rash.   Skin without rash or bruising.  Extremities well perfused.  30 minutes were devoted to patient care in this encounter (this includes time spent reviewing the patient's file/history, interviewing and examining the patient, counseling/reviewing plan with patient).

## 2021-08-16 NOTE — Patient Instructions (Signed)
Take tylenol 3 times as needed and go to the lab on the way out.   If you have mychart we'll likely use that to update you.    We'll call about seeing the GI clinic.  Take care.  Glad to see you.

## 2021-08-18 DIAGNOSIS — M545 Low back pain, unspecified: Secondary | ICD-10-CM | POA: Insufficient documentation

## 2021-08-18 NOTE — Assessment & Plan Note (Signed)
I still think it makes sense to see the GI clinic given that she still taking dicyclomine and still has a decrease in symptoms with a bowel movement.  Now however, it looks on exam like her current pain is more associated with her back .  Discussed getting plain films done today and going from there.  Decrease Tylenol to 3 times a day since she is taking extra Tylenol.  Still okay for outpatient follow-up.  She agrees with plan.  She does not have any emergent symptoms such as weakness or numbness in the legs.

## 2021-08-21 ENCOUNTER — Encounter: Payer: Self-pay | Admitting: Adult Health

## 2021-08-21 ENCOUNTER — Ambulatory Visit (INDEPENDENT_AMBULATORY_CARE_PROVIDER_SITE_OTHER): Payer: Medicare Other | Admitting: Adult Health

## 2021-08-21 VITALS — BP 150/93 | HR 97 | Ht 60.0 in | Wt 120.0 lb

## 2021-08-21 DIAGNOSIS — E785 Hyperlipidemia, unspecified: Secondary | ICD-10-CM

## 2021-08-21 DIAGNOSIS — I10 Essential (primary) hypertension: Secondary | ICD-10-CM | POA: Diagnosis not present

## 2021-08-21 DIAGNOSIS — I614 Nontraumatic intracerebral hemorrhage in cerebellum: Secondary | ICD-10-CM | POA: Diagnosis not present

## 2021-08-21 NOTE — Patient Instructions (Signed)
Continue aspirin 81 mg daily  and Crestor fenofibrate  for secondary stroke prevention  Continue to follow up with PCP regarding cholesterol and blood pressure management  Maintain strict control of hypertension with blood pressure goal below 130/90 and cholesterol with LDL cholesterol (bad cholesterol) goal below 70 mg/dL.   Please ensure you schedule follow up visit with Dr. Ellene Route regarding prior back procedure  If you do not hear from GI by the end of the week, please ensure you follow up with Dr. Damita Dunnings      Followup in the future with me in 6 months or call earlier if needed       Thank you for coming to see [place] at [facility]. I hope we have been able to provide you high quality care today.  You may receive a patient satisfaction survey over the next few weeks. We would appreciate your feedback and comments so that we may continue to improve ourselves and the health of our patients.

## 2021-08-21 NOTE — Progress Notes (Signed)
[facility] [address] [place]. [place] [address] [phone]       STROKE FOLLOW UP NOTE  [name] Date of Birth:  10-03-1951 Medical Record Number:  [id number]   Reason for Referral: stroke follow up    SUBJECTIVE:   CHIEF COMPLAINT:  Chief Complaint  Patient presents with   Follow-up    Rm 3 alone Pt is well and stable, no new concerns       HPI:   Update 08/21/2021 JM: Returns for 60-month stroke follow-up unaccompanied   Stable from stroke standpoint -denies new stroke/TIA symptoms Residual decreased right hand dexterity stable  Compliant on aspirin, Crestor and fenofibrate -denies side effects Blood pressure today 150/93  After prior visit, referred to ortho for right shoulder pain worsened post fall - they recommended right total shoulder replacement initially scheduled 10/13 but she put this off as she is concerned that she does not currently have any additional help at home while recovering   Recently completed therapy for back issues but continues to have back pain which has been gradually worsening - she is not sure if worsening pain is due to issues she has been having with RLQ pain - currently waiting to schedule initial eval with GI  No further concerns at this time    History provided for reference purposes only Update 04/22/2021 JM: Morgan Bell returns for 34-month stroke follow-up.  She has been stable from stroke standpoint without new stroke/TIA symptoms but unfortunately started to have multiple falls with BLE weakness and presented to UC on 01/12/2021 s/p fall and transferred to [facility] ED for further evaluation.  She was found to have right foot metatarsal fracture as well as large disc herniation at the T7-T8 level with cord compression which requires surgical decompression.  She was transferred to short-term SNF rehab due to continued BLE weakness and gait difficulty.  She has since returned back home beginning of June and  is currently participating in [facility] PT. she does report continued gradual improvement.  She is able to ambulate short distance with RW and 1 person assistance.  She denies any recent falls. She has been experiencing right shoulder pain with movement which has been gradually worsening since returning home.  She questions possible injury with her prior fall.  She does report prior history of right shoulder partial rotator cuff tear which had previously been stable.  Her sons are helping with assistance as needed such as providing meals and getting groceries.  Residual stroke deficits of decreased right hand dexterity stable.  Compliant on aspirin, Crestor and fenofibrate for secondary stroke prevention without associated side effects.  Blood pressure today 154/94.  She does have visit to establish care with new PCP Dr. Damita Dunnings 7/18 as her prior PCP left office (per patient).  No further concerns at this time.  Update 11/07/2020 JM: Morgan Bell returns to discuss disability paperwork.  She has not returned back to work since her stroke on 05/13/2020 due to residual gait impairment, imbalance and decreased right hand dexterity.  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  She is currently ambulating with a four-point cane and has difficulty with longer distance ambulation.  She was previously using single-point cane after her lumbar laminectomy in 10/2019 but was able to ambulate longer distance without difficulty.  She does not feel as though she would be able to return back to work at this point and be able to maintain  current job requirements.  She is hopeful that she will be able to return to work at some point and wishes to wait for at least 1 full year after her stroke for max potential recovery.  Update 10/18/2020 JM: Morgan Bell returns for prolonged 27-month stroke follow-up with prior visit on 06/14/2020.  Residual gait impairment with some improvement and right hand  decreased dexterity stable without worsening. Currently using a cane for ambulation but has recently been experiencing worsening back pain.  History of back pain undergoing surgical procedure approximately 1 year ago.  She was unsure pain related to prior back procedure or poststroke due to gait abnormality.  Previously prescribed gabapentin 100 mg 4 times daily but self increased currently taking 300/200/300 with great benefit.  She has been able to increase activity and denies any side effects. Denies new stroke/TIA symptoms.  Repeat CT head 06/2020 showed resolution of prior ICH therefore restarted aspirin 81 mg daily for secondary stroke prevention.  She has remained on aspirin 81 mg daily, Crestor and fenofibrate without side effects.  Blood pressure today 127/84. Monitors at home which has been stable.  No further concerns at this time.  Initial visit 06/14/2020 JM: Morgan Bell is being seen for hospital follow-up She has since returned home from SNF stay Reports continued deficits imbalance, and right hand weakness  She does report improvement but not yet to baseline She is working with [facility] PT/SLP  and nurse.  Reports SLP will be continued as she previously was experiencing cognitive impairment with delayed recall but this has since improved Ambulates long distance with RW but is able to ambulate short distance without assistive device Currently living alone maintaining all ADLs and majority of IADLs without difficulty  Her son lives nearby and assist as needed Denies new or worsening stroke/TIA symptoms Continues on Crestor and fenofibrate without side effects Blood pressure today 135/91.  Routinely monitors at home and typically 130s/90s.  Reports compliance with amlodipine 5 mg daily and Avapro 300 mg daily.   She has had follow-up with PCP since discharge No further concerns at this time  Stroke admission 05/13/2020 Morgan Bell is a 70 y.o. female with history of hypertension with  questionable compliance to medication, headaches, angina, rheumatoid arthritis, TIA 21 years ago with no residual deficits who presented on 05/13/2020 with dizziness and difficulty walking.  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  Aspirin PTA discontinued in setting of ICH. Hx of HTN initially on Cleviprex stabilize during admission and resumed home meds of amlodipine and Avapro. Of note, questionable medication noncompliance.  Hx of HLD with LDL 49 and recommended resuming home dose Crestor and fenofibrate at discharge due to [place] (held during admission).  Other stroke risk factors include advanced age, history of TIA and migraines.  Other active problems include RA on Arava and melanoma.  Evaluated by therapy initially recommending home health services but due to lack of assistance at home, discharge to SNF for functional decline and further therapy needs.  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 Small vessel disease. Atrophy. Sinus dz. CTA head & neck no vascular abnormality. Mild atherosclerosis. Tortuosity.   MRI w/w/o R cerebellar hemorrhage w/ mild edema. No lesion. R basal ganglia and thalamic chronic microhemorrhages. 2D Echo EF 55-60%. No source of embolus   LDL 49 HgbA1c 5.6 aspirin 81 mg daily prior to admission, now on No antithrombotic given hemorrhage  Therapy recommendations:  HH PT, OT, SLP -> agree w/ SNF as assist no available Disposition: short term SNF - lived alone PTA. Son can stay with her on the weekend    ROS:   14 system review of systems performed and negative with exception of those listed in HPI  PMH:  Past Medical History:  Diagnosis Date   Arthritis    RA   Exertional angina ([place]) 07/16/2018   CT coronary 09/13/18: Coronary Ca score 62.1, 68% for age/sex matched control, No evidence of obstructive CAD (1-24% LAD, RCA)   Family history of adverse  reaction to anesthesia    son has difficulty waking    Headache(784.0)    HX MIGRAINES    Hypertension    Melanoma ([place])    Obesity    Stroke ([place])    MINI STROKE     21 YRS AGO     PSH:  Past Surgical History:  Procedure Laterality Date   APPLICATION OF ROBOTIC ASSISTANCE FOR SPINAL PROCEDURE N/A 10/18/2019   Procedure: APPLICATION OF ROBOTIC ASSISTANCE FOR SPINAL PROCEDURE;  Surgeon: Kristeen Miss, MD;  Location: [facility];  Service: Neurosurgery;  Laterality: N/A;   CESAREAN SECTION  11/26/1973   CESAREAN SECTION  09/22/1975   CESAREAN SECTION  04/30/1978   HEMORRHOID SURGERY  06/1987   LUMBAR LAMINECTOMY/DECOMPRESSION MICRODISCECTOMY N/A 01/15/2021   Procedure: Thoracic Seven-Eight Laminectomy with transpedicular discectomy;  Surgeon: Kristeen Miss, MD;  Location: [facility];  Service: Neurosurgery;  Laterality: N/A;  Thoracic Seven-Eight Laminectomy with transpedicular discectomy   MELANOMA EXCISION WITH SENTINEL LYMPH NODE BIOPSY Right 03/02/2013   Procedure: Wide MELANOMA EXCISION Right Abdominal wall WITH SENTINEL LYMPH NODE mapping Right Axilla;  Surgeon: Merrie Roof, MD;  Location: [facility];  Service: General;  Laterality: Right;   NM MYOCAR PERF WALL MOTION  2003   negative Bruce protocol exercise stress test with no evidence of perfusion abnormality, EF 66%   TOTAL HIP ARTHROPLASTY Right 03/2011    Social History:  Social History   Socioeconomic History   Marital status: Divorced    Spouse name: Not on file   Number of children: 3   Years of education: 12   Highest education level: Not on file  Occupational History    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   Tobacco Use   Smoking status: Never   Smokeless tobacco: Never  Vaping Use   Vaping Use: Never used  Substance and Sexual Activity   Alcohol use: No   Drug use: No   Sexual activity: Never  Other Topics Concern   Not on file  Social History Narrative   Retired Network engineer     Lives alone.     3 kids (all local)   Social Determinants of Health   Financial Resource Strain: Not on file  Food Insecurity: No Food Insecurity   Worried About Charity fundraiser in the Last Year: Never true   Ran Out of Food in the Last Year: Never true  Transportation Needs: No Transportation Needs   Lack of Transportation (Medical): No   Lack of Transportation (Non-Medical): No  Physical Activity: Unknown   Days of Exercise per Week: 3 days   Minutes of Exercise per Session: Not on file  Stress: Not on file  Social Connections: Not on file  Intimate Partner Violence: Not on file    Family History:  Family History  Problem Relation Age of Onset   Alzheimer's  disease Mother    Heart disease Father        MI, died at 25   Alzheimer's disease Maternal Grandmother    Cancer Maternal Grandfather        lung   Heart attack Paternal Grandfather    Tuberculosis Sister     Medications:   Current Outpatient Medications on File Prior to Visit  Medication Sig Dispense Refill   acetaminophen (TYLENOL) 500 MG tablet Take 1-2 tablets (500-1,000 mg total) by mouth every 8 (eight) hours as needed.     amLODipine (NORVASC) 5 MG tablet Take 1 tablet (5 mg total) by mouth 2 (two) times daily. 180 tablet 1   aspirin EC 81 MG tablet Take 1 tablet (81 mg total) by mouth daily. Swallow whole. 30 tablet 11   calcium-vitamin D (OSCAL WITH D) 500-200 MG-UNIT tablet Take 1 tablet by mouth daily.     cetirizine (ZYRTEC) 10 MG tablet Take 10 mg by mouth daily.     dicyclomine (BENTYL) 10 MG capsule TAKE 1 CAPSULE BY MOUTH FOUR TIMES DAILY BEFORE MEALS AND AT BEDTIME. USE AS NEEDED FOR ABDOMINAL PAIN/CRAMPING 40 capsule 1   fenofibrate (TRICOR) 145 MG tablet TAKE 1 TABLET BY MOUTH DAILY 30 tablet 11   gabapentin (NEURONTIN) 300 MG capsule Take 1 capsule (300 mg total) by mouth 3 (three) times daily. 270 capsule 3   irbesartan (AVAPRO) 300 MG tablet Take 1 tablet (300 mg total) by mouth daily. 90  tablet 0   leflunomide (ARAVA) 20 MG tablet Take 20 mg daily by mouth.     methocarbamol (ROBAXIN) 500 MG tablet Take 1 tablet (500 mg total) by mouth every 6 (six) hours as needed for muscle spasms. 40 tablet 1   potassium chloride SA (KLOR-CON) 20 MEQ tablet Take 1 tablet (20 mEq total) by mouth 2 (two) times daily. 60 tablet 1   rosuvastatin (CRESTOR) 40 MG tablet Take 1 tablet (40 mg total) by mouth daily. 90 tablet 1   spironolactone (ALDACTONE) 25 MG tablet Take 1 tablet (25 mg total) by mouth daily. 90 tablet 0   No current facility-administered medications on file prior to visit.    Allergies:   Allergies  Allergen Reactions   Sulfa Antibiotics Rash    SULFA DRUGS      OBJECTIVE:  Physical Exam  Vitals:   08/21/21 1116  BP: (!) 150/93  Pulse: 97  Weight: 120 lb (54.4 kg)  Height: 5' (1.524 m)   Body mass index is 23.44 kg/m.  General: well developed, well nourished, very pleasant elderly Caucasian female, seated in w/c  Head: head normocephalic and atraumatic.   Neck: supple with no carotid or supraclavicular bruits Cardiovascular: regular rate and rhythm, no murmurs Musculoskeletal: limited R shoulder AROM Skin:  no rash/petichiae Vascular:  Normal pulses all extremities   Neurologic Exam Mental Status: Awake and fully alert.  Fluent speech and language. Oriented to place and time. Recent and remote memory intact. Attention span, concentration and fund of knowledge appropriate. Mood and affect appropriate.  Cranial Nerves: Pupils equal, briskly reactive to light. Extraocular movements full without nystagmus. Visual fields full to confrontation. Hearing intact. Facial sensation intact. Face, tongue, palate moves normally and symmetrically.  Motor: Normal bulk and tone. LUE: 5/5. RUE: slightly decreased right hand dexterity and fine motor control (dominant hand). 4/5 mild BLE hip flexor weakness Sensory.: intact to touch , pinprick , position and vibratory  sensation.  Coordination: Rapid alternating movements normal in all extremities except  slightly decreased right hand dexterity. Finger-to-nose performed accurately LUE and heel-to-shin performed accurately bilaterally. Gait and Station: Stands from seated position without difficulty.  Stance is hunched. Takes a few short cautious steps from chair to w/c unassisted Reflexes: 1+ and symmetric. Toes downgoing.        ASSESSMENT: Morgan Bell is a 70 y.o. 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 Vascular risk factors include HTN, HLD, advanced age, history of TIA (21 years ago) and migraine headaches. S/p left discectomy T7-T8 for large disc herniation with cord compression after a fall 01/2021     PLAN:  Hypertensive R cerebellar ICH:  Residual deficit: decreased right hand dexterity. stable  Repeat CT head 06/22/2020 resolution of ICH Continue aspirin 81 mg daily and Crestor 40 mg daily and fenofibrate for secondary stroke prevention.  Discussed secondary stroke prevention measures and importance of close PCP follow up for aggressive stroke risk factor management  Thoracic myelopathy: MR lumbar large disc herniation at T7-T8 level with cord compression as well as compression fractures at T8 and T9 s/p Metrix discectomy 01/2021. Advised to schedule f/u visit with Dr. Ellene Route as no f/u visit completed since procedure. Recent xray lumbar stable compared to prior imaging  Right shoulder pain: Followed by [facility].  Needs shoulder replacement surgery - advised to speak with family members for possible assistance after procedure or speak with insurance company for possible coverage of [place] assistance during recovery RLQ pain: advised to schedule initial eval with GI which PCP referred to last Monday - contact information provided.  HTN:  BP goal <130/90. Stable on  amlodipine and irbesartan per PCP/cardiology HLD:  LDL goal <70. On Crestor 40 mg daily and fenofibrate 145 mg daily per cardiology.  Prior lipid panel satisfactory    Follow-up in 6 months or call earlier if needed   CC:  Tonia Ghent, MD    I spent 31 minutes of face-to-face and non-face-to-face time with patient.  This included previsit chart review, lab review, study review, electronic health record documentation, patient education regarding prior stroke as well as secondary stroke prevention measures and aggressive stroke risk factor management,  residual deficits, neck pain, right shoulder pain and RLQ pain and answered all other questions to patient's satisfaction   Frann Rider, AGNP-BC  [facility] [address] [place], [place] [address]  Phone [phone] Fax [phone] Note: This document was prepared with digital dictation and possible smart phrase technology. Any transcriptional errors that result from this process are unintentional.

## 2021-08-22 ENCOUNTER — Telehealth: Payer: Medicare Other

## 2021-08-27 ENCOUNTER — Other Ambulatory Visit: Payer: Self-pay | Admitting: Family Medicine

## 2021-08-28 NOTE — Telephone Encounter (Signed)
Noted. Thanks.

## 2021-08-28 NOTE — Telephone Encounter (Signed)
Sent. Thanks.  When is she going to see GI?   When is she going to see Dr. Clar[facility]?  Please let me know.

## 2021-08-28 NOTE — Telephone Encounter (Signed)
She has not made an appt with either office yet but states she will try to get that done today or tomorrow.

## 2021-08-28 NOTE — Telephone Encounter (Signed)
Refill request for Bentyl and Robaxin  LOV - 08/16/21 Next OV - not scheduled Last refill - Bentyl 07/26/21 #40/1        Robaxin 05/22/21 #40/1

## 2021-09-07 DIAGNOSIS — Z4789 Encounter for other orthopedic aftercare: Secondary | ICD-10-CM | POA: Diagnosis not present

## 2021-09-07 DIAGNOSIS — S22069D Unspecified fracture of T7-T8 vertebra, subsequent encounter for fracture with routine healing: Secondary | ICD-10-CM | POA: Diagnosis not present

## 2021-09-11 ENCOUNTER — Encounter: Payer: Self-pay | Admitting: Internal Medicine

## 2021-09-12 DEATH — deceased

## 2021-09-20 ENCOUNTER — Ambulatory Visit: Payer: Medicare Other

## 2021-09-20 DIAGNOSIS — I10 Essential (primary) hypertension: Secondary | ICD-10-CM | POA: Diagnosis not present

## 2021-09-20 DIAGNOSIS — M545 Low back pain, unspecified: Secondary | ICD-10-CM

## 2021-09-20 DIAGNOSIS — Z6825 Body mass index (BMI) 25.0-25.9, adult: Secondary | ICD-10-CM | POA: Diagnosis not present

## 2021-09-20 DIAGNOSIS — E785 Hyperlipidemia, unspecified: Secondary | ICD-10-CM

## 2021-09-20 DIAGNOSIS — M4714 Other spondylosis with myelopathy, thoracic region: Secondary | ICD-10-CM | POA: Diagnosis not present

## 2021-09-20 NOTE — Chronic Care Management (AMB) (Signed)
Chronic Care Management   CCM RN Visit Note  09/20/2021 Name: Morgan Bell MRN: [id number] DOB: Nov 13, 1950  Subjective: Morgan Bell is a 70 y.o. year old female who is a primary care patient of Tonia Ghent, MD. The care management team was consulted for assistance with disease management and care coordination needs.    Engaged with patient by telephone for follow up visit in response to provider referral for case management and/or care coordination services.   Consent to Services:  The patient was given information about Chronic Care Management services, agreed to services, and gave verbal consent prior to initiation of services.  Please see initial visit note for detailed documentation.   Patient agreed to services and verbal consent obtained.   Assessment: Review of patient past medical history, allergies, medications, health status, including review of consultants reports, laboratory and other test data, was performed as part of comprehensive evaluation and provision of chronic care management services.   SDOH (Social Determinants of Health) assessments and interventions performed:    CCM Care Plan  Allergies  Allergen Reactions   Sulfa Antibiotics Rash    SULFA DRUGS    Outpatient Encounter Medications as of 09/20/2021  Medication Sig   acetaminophen (TYLENOL) 500 MG tablet Take 1-2 tablets (500-1,000 mg total) by mouth every 8 (eight) hours as needed.   amLODipine (NORVASC) 5 MG tablet Take 1 tablet (5 mg total) by mouth 2 (two) times daily.   aspirin EC 81 MG tablet Take 1 tablet (81 mg total) by mouth daily. Swallow whole.   calcium-vitamin D (OSCAL WITH D) 500-200 MG-UNIT tablet Take 1 tablet by mouth daily.   cetirizine (ZYRTEC) 10 MG tablet Take 10 mg by mouth daily.   dicyclomine (BENTYL) 10 MG capsule TAKE 1 CAPSULE BY MOUTH FOUR TIMES DAILY BEFORE MEALS AND AT BEDTIME. AS NEEDED ABDOMINAL PAIN/CRAMPING   fenofibrate (TRICOR) 145 MG tablet TAKE 1 TABLET BY  MOUTH DAILY   gabapentin (NEURONTIN) 300 MG capsule Take 1 capsule (300 mg total) by mouth 3 (three) times daily.   irbesartan (AVAPRO) 300 MG tablet Take 1 tablet (300 mg total) by mouth daily.   leflunomide (ARAVA) 20 MG tablet Take 20 mg daily by mouth.   methocarbamol (ROBAXIN) 500 MG tablet TAKE 1 TABLET(500 MG) BY MOUTH EVERY 6 HOURS AS NEEDED FOR MUSCLE SPASMS   potassium chloride SA (KLOR-CON) 20 MEQ tablet Take 1 tablet (20 mEq total) by mouth 2 (two) times daily.   rosuvastatin (CRESTOR) 40 MG tablet Take 1 tablet (40 mg total) by mouth daily.   spironolactone (ALDACTONE) 25 MG tablet Take 1 tablet (25 mg total) by mouth daily.   No facility-administered encounter medications on file as of 09/20/2021.    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   Priority: High   Long-Range Goal: Development of plan of care to address chronic disease management and/ or care coordination needs.   Start Date: 09/20/2021  Expected End Date: 01/10/2022  Priority: High  Current Barriers:  Knowledge Deficits related to plan of care for management of HTN, HLD, and chronic back pain   Chronic Disease Management support and education needs related to HTN, HLD, and chronic back pain  Patient reports having follow up with primary care provider and neurologist in November 2022.  She states she continues to have back and left side pain.  Reports pain level to day is a 5 out of 10.   Patient states her primary care provider referred her to a gastroenterologist.  Appointment date is 09/25/2021.  Patient states her primary provider recommended she follow back up with the surgeon that did her back surgery.  Patient states she is in the office today waiting to be seen by the surgeon. Per chart review patients blood pressure readings: 130/80 and 150/93.    RNCM Clinical Goal(s):  Patient will verbalize basic understanding of HTN, HLD, and Chronic back pain disease process and self health management plan as evidenced by patient report and/ or notation in chart take all medications exactly as prescribed and will call provider for medication related questions as evidenced by patient report and / or notation in chart    attend all scheduled medical appointments:   as evidenced by patient report and/ or notation in chart.         continue to work with Consulting civil engineer and/or Social Worker to address care management and care coordination needs related to HTN, HLD, and chronic back pain as evidenced by adherence to CM Team Scheduled appointments     through collaboration with Consulting civil engineer, provider, and care team.   Interventions: 1:1 collaboration with primary care provider regarding development and update of comprehensive plan of care as evidenced by provider attestation and co-signature Inter-disciplinary care team collaboration (see longitudinal plan of care) Evaluation of current treatment plan related to  self management and patient's adherence to plan as established by provider   Hyperlipidemia Interventions:  (Status:  Goal on track:  Yes.) Long Term Goal Medication review performed;  medication list updated in electronic medical record.  Counseled on importance of regular laboratory monitoring as prescribed Reviewed importance of limiting foods high in cholesterol  Hypertension Interventions:  (Status:  Goal on track:  Yes.) Long Term Goal Last practice recorded BP readings:  BP Readings from Last 3 Encounters:  08/21/21 (!) 150/93  08/16/21 130/80  07/26/21 120/84  Most recent eGFR/CrCl: No results found for: EGFR  No components found for: CRCL  Evaluation of current treatment plan related to hypertension self management and patient's adherence to plan as established by provider Reviewed medications with patient and discussed importance of compliance Discussed plans with patient for ongoing care management follow up and provided patient with direct contact information for care management team Reviewed scheduled/upcoming provider appointments including:   Pain Interventions:  (Status:  New goal.) Long Term Goal Pain assessment performed Medications reviewed Reviewed provider established plan for pain management Discussed importance of adherence to all scheduled medical appointments Counseled on the importance of reporting any/all new or changed pain symptoms or management strategies to pain management provider Discussed use of relaxation techniques and/or diversional activities to assist with pain reduction (distraction, imagery,  relaxation, massage, acupressure, TENS, heat, and cold application Reviewed with patient prescribed pharmacological and nonpharmacological pain relief strategies   Patient Goals/Self-Care Activities: Take medications as prescribed   Attend all scheduled provider appointments Call pharmacy for medication refills 3-7 days in advance of running out of medications Call provider office for new concerns or questions  Continue to monitor blood pressure at least 2-3 times per week and record Continue to follow a low salt / heart healthy diet.   Use relaxation techniques and/ or diversion activities to assist with pain reduction ( distraction, imagery, relaxation, massage, heat and / or cold application)        Plan:The patient has been provided with contact information for the care management team and has been advised to call with any health related questions or concerns.  The care management team will reach out to the patient again over the next 3 months .  [name] RN,BSN,CCM RN Case Manager [place]  [phone]

## 2021-09-20 NOTE — Patient Instructions (Signed)
Visit Information  Thank you for taking time to visit with me today. Please don't hesitate to contact me if I can be of assistance to you before our next scheduled telephone appointment.  Patient Goals/Self-Care Activities: Take medications as prescribed   Attend all scheduled provider appointments Call pharmacy for medication refills 3-7 days in advance of running out of medications Call provider office for new concerns or questions  Continue to monitor blood pressure at least 2-3 times per week and record Continue to follow a low salt / heart healthy diet.  Use relaxation techniques and/ or diversion activities to assist with pain reduction ( distraction, imagery, relaxation, massage, heat and / or cold application)  Our next appointment is by telephone on December 02, 2021 at 11:00 am  Please call the care guide team at [phone] if you need to cancel or reschedule your appointment.   If you are experiencing a Mental Health or [place] or need someone to talk to, please call the Suicide and Crisis Lifeline: 988 call the [place] National Suicide Prevention Lifeline: 4124425208 or TTY: [phone] TTY [phone]) to talk to a trained counselor call 1-800-273-TALK (toll free, 24 hour hotline)   Patient verbalizes understanding of instructions provided today and agrees to view in [place].   [name] RN,BSN,CCM RN Case Manager [place]  [phone]

## 2021-09-23 ENCOUNTER — Other Ambulatory Visit: Payer: Self-pay | Admitting: Neurological Surgery

## 2021-09-23 DIAGNOSIS — M4714 Other spondylosis with myelopathy, thoracic region: Secondary | ICD-10-CM

## 2021-09-25 ENCOUNTER — Ambulatory Visit (INDEPENDENT_AMBULATORY_CARE_PROVIDER_SITE_OTHER): Payer: Medicare Other | Admitting: Internal Medicine

## 2021-09-25 ENCOUNTER — Encounter: Payer: Self-pay | Admitting: Internal Medicine

## 2021-09-25 VITALS — BP 140/98 | HR 94 | Ht 59.0 in | Wt 135.1 lb

## 2021-09-25 DIAGNOSIS — R109 Unspecified abdominal pain: Secondary | ICD-10-CM

## 2021-09-25 DIAGNOSIS — Z1211 Encounter for screening for malignant neoplasm of colon: Secondary | ICD-10-CM | POA: Diagnosis not present

## 2021-09-25 MED ORDER — DICLOFENAC SODIUM 1 % EX GEL: 2.0000 g | CUTANEOUS | 0 refills | Status: AC | PRN

## 2021-09-25 NOTE — Patient Instructions (Addendum)
If you are age 72 or older, your body mass index should be between 23-30. Your Body mass index is 27.29 kg/m. If this is out of the aforementioned range listed, please consider follow up with your Primary Care Provider.  If you are age 10 or younger, your body mass index should be between 19-25. Your Body mass index is 27.29 kg/m. If this is out of the aformentioned range listed, please consider follow up with your Primary Care Provider.   ________________________________________________________  The [facility] providers would like to encourage you to use [facility] to communicate with providers for non-urgent requests or questions.  Due to long hold times on the telephone, sending your provider a message by [facility] may be a faster and more efficient way to get a response.  Please allow 48 business hours for a response.  Please remember that this is for non-urgent requests.  _______________________________________________________  Dennis Bast have been scheduled for a CT scan of the abdomen and pelvis at [facility] ([address]---this is in the same building as Press [facility]).   You are scheduled on _____ at ________. You should arrive 15 minutes prior to your appointment time for registration. Please follow the written instructions below on the day of your exam:  WARNING: IF YOU ARE ALLERGIC TO IODINE/X-RAY DYE, PLEASE NOTIFY RADIOLOGY IMMEDIATELY AT [phone]! YOU WILL BE GIVEN A 13 HOUR PREMEDICATION PREP.  1) Do not eat or drink anything after _______ (4 hours prior to your test) 2) You have been given 2 bottles of oral contrast to drink. The solution may taste               better if refrigerated, but do NOT add ice or any other liquid to this solution. Shake             well before drinking.    Drink 1 bottle of contrast @ ______ (2 hours prior to your exam)  Drink 1 bottle of contrast @ ______ (1 hour prior to your exam)  You may take any medications as prescribed with a  small amount of water except for the following: Metformin, Glucophage, Glucovance, Avandamet, Riomet, Fortamet, Actoplus Met, Janumet, Glumetza or Metaglip. The above medications must be held the day of the exam AND 48 hours after the exam.  The purpose of you drinking the oral contrast is to aid in the visualization of your intestinal tract. The contrast solution may cause some diarrhea. Before your exam is started, you will be given a small amount of fluid to drink. Depending on your individual set of symptoms, you may also receive an intravenous injection of x-ray contrast/dye. Plan on being at [facility] for 30 minutes or long, depending on the type of exam you are having performed.  If you have any questions regarding your exam or if you need to reschedule, you may call the CT department at [phone] between the hours of 8:00 am and 5:00 pm, Monday-Friday.  We have sent the following medications to your pharmacy for you to pick up at your convenience:  Voltaren gel

## 2021-09-25 NOTE — Progress Notes (Signed)
Chief Complaint: Ab pain  HPI : 70 year old female with history of RA, CAD, migraines, CVA, melanoma, obesity presents with abdominal pain and constipation  Halfway thorugh her recuperation from a fall on the right side, patient started developing right sided flank pain. Her flank pain started 04/2021. When she lays down, the pain seemes to get better. Straining on that side and lifting stuff makes the pain worse. Eating or having a BM does not make it better or worse. She has tried Tylenol and gabapentin without much improvement.  She has had benefit from muscle relaxers for her pain in the past. She hasn't seen any rashes or bruises over that area. Denies GI issues to her knowledge. She last had a colonoscopy at age 67 that was reportedly normal. Denies N&V, dysphagia, melena, hematochezia, weight loss, constipation, or diarrhea. She on average has one BM per day. Denies fam hx of GI issues or GI cancers. Heating pads have helped with the pain. Denies hematuria or dysuria.   Past Medical History:  Diagnosis Date   Arthritis    RA   Exertional angina ([place]) 07/16/2018   CT coronary 09/13/18: Coronary Ca score 62.1, 68% for age/sex matched control, No evidence of obstructive CAD (1-24% LAD, RCA)   Family history of adverse reaction to anesthesia    son has difficulty waking    Headache(784.0)    HX MIGRAINES    HLD (hyperlipidemia)    Hypertension    Melanoma ([place])    Obesity    Stroke ([place])    MINI STROKE     21 YRS AGO      Past Surgical History:  Procedure Laterality Date   APPLICATION OF ROBOTIC ASSISTANCE FOR SPINAL PROCEDURE N/A 10/18/2019   Procedure: APPLICATION OF ROBOTIC ASSISTANCE FOR SPINAL PROCEDURE;  Surgeon: Kristeen Miss, MD;  Location: [place];  Service: Neurosurgery;  Laterality: N/A;   CESAREAN SECTION  11/26/1973   CESAREAN SECTION  09/22/1975   CESAREAN SECTION  04/30/1978   HEMORRHOID SURGERY  06/1987   LUMBAR LAMINECTOMY/DECOMPRESSION MICRODISCECTOMY N/A 01/15/2021    Procedure: Thoracic Seven-Eight Laminectomy with transpedicular discectomy;  Surgeon: Kristeen Miss, MD;  Location: [place];  Service: Neurosurgery;  Laterality: N/A;  Thoracic Seven-Eight Laminectomy with transpedicular discectomy   MELANOMA EXCISION WITH SENTINEL LYMPH NODE BIOPSY Right 03/02/2013   Procedure: Wide MELANOMA EXCISION Right Abdominal wall WITH SENTINEL LYMPH NODE mapping Right Axilla;  Surgeon: Luella Cook III, MD;  Location: [place];  Service: General;  Laterality: Right;   [place]  2003   negative Bruce protocol exercise stress test with no evidence of perfusion abnormality, EF 66%   TOTAL HIP ARTHROPLASTY Right 03/2011   Family History  Problem Relation Age of Onset   Alzheimer's disease Mother    Heart disease Father        MI, died at 52   Tuberculosis Sister    Alzheimer's disease Maternal Grandmother    Lung cancer Maternal Grandfather    Heart attack Paternal Grandfather    Diverticulitis Son    Hypertension Son    Social History   Tobacco Use   Smoking status: Never   Smokeless tobacco: Never  Vaping Use   Vaping Use: Never used  Substance Use Topics   Alcohol use: No   Drug use: No   Current Outpatient Medications  Medication Sig Dispense Refill   acetaminophen (TYLENOL) 500 MG tablet Take 1-2 tablets (500-1,000 mg total) by mouth every 8 (eight)  hours as needed.     amLODipine (NORVASC) 5 MG tablet Take 1 tablet (5 mg total) by mouth 2 (two) times daily. 180 tablet 1   aspirin EC 81 MG tablet Take 1 tablet (81 mg total) by mouth daily. Swallow whole. 30 tablet 11   calcium-vitamin D (OSCAL WITH D) 500-200 MG-UNIT tablet Take 1 tablet by mouth daily.     cetirizine (ZYRTEC) 10 MG tablet Take 10 mg by mouth daily.     fenofibrate (TRICOR) 145 MG tablet TAKE 1 TABLET BY MOUTH DAILY 30 tablet 11   gabapentin (NEURONTIN) 300 MG capsule Take 1 capsule (300 mg total) by mouth 3 (three) times daily. 270 capsule 3   irbesartan (AVAPRO) 300 MG  tablet Take 1 tablet (300 mg total) by mouth daily. 90 tablet 0   leflunomide (ARAVA) 20 MG tablet Take 20 mg daily by mouth.     potassium chloride SA (KLOR-CON) 20 MEQ tablet Take 1 tablet (20 mEq total) by mouth 2 (two) times daily. 60 tablet 1   rosuvastatin (CRESTOR) 40 MG tablet Take 1 tablet (40 mg total) by mouth daily. 90 tablet 1   spironolactone (ALDACTONE) 25 MG tablet Take 1 tablet (25 mg total) by mouth daily. 90 tablet 0   methocarbamol (ROBAXIN) 500 MG tablet TAKE 1 TABLET(500 MG) BY MOUTH EVERY 6 HOURS AS NEEDED FOR MUSCLE SPASMS (Patient not taking: Reported on 09/25/2021) 40 tablet 1   No current facility-administered medications for this visit.   Allergies  Allergen Reactions   Sulfa Antibiotics Rash    SULFA DRUGS     Review of Systems: All systems reviewed and negative except where noted in HPI.   Physical Exam: BP (!) 140/98 (BP Location: Left Arm, Patient Position: Sitting, Cuff Size: Normal)    Pulse 94    Ht 4\' 11"  (1.499 m) Comment: height measured without shoes   Wt 135 lb 2 oz (61.3 kg)    BMI 27.29 kg/m  Constitutional: Pleasant,well-developed, female in no acute distress. HEENT: Normocephalic and atraumatic. Conjunctivae are normal. No scleral icterus. Cardiovascular: Normal rate, regular rhythm.  Pulmonary/chest: Effort normal and breath sounds normal. No wheezing, rales or rhonchi. Abdominal: Soft, nondistended, minimally tender over the posterior portion of the bottom right ribs Extremities: No edema Neurological: Alert and oriented to person place and time. Skin: Skin is warm and dry. No rashes noted. Psychiatric: Normal mood and affect. Behavior is normal.  Labs 05/2021: CBC with plts 119, CMP normal. Lipase normal.  RUQ U/S 05/24/21: IMPRESSION: Increased echogenicity of hepatic parenchyma is noted suggesting hepatic steatosis.  Two right renal cysts are noted, the largest measuring 9.4 cm.  HIDA scan 08/06/21: IMPRESSION: Normal  exam.  ASSESSMENT AND PLAN:  R flank pain Hepatic steatosis Colon cancer screening Patient presents with right flank pain shortly after a fall on her right side.  She has already had work-up with normal LFTs, normal lipase, RUQ U/S negative for gallstones, and normal HIDA scan. At this time I suspect that her right flank pain is likely due to MSK issues based upon the fact that is improves with muscle relaxers, is not affected by food intake/bowel movements, is located over her ribs, and worsens with movement. Patient does not describe any issues with constipation/diarrhea. Will plan to get CT A/P to rule out alternative causes of ab pain. I did discuss a colonoscopy for colon cancer screening with the patient, and she is not interested at this time. - Voltaren gel PRN - Check  CT A/P w/contrast - Patient would like to defer on colonoscopy until after she is able to get her right flank and right shoulder pain under better control - Consider work up for fatty liver in the future  Christia Reading, MD  I spent 62 minutes of time, including in depth chart review, independent review of results as outlined above, communicating results with the patient directly, face-to-face time with the patient, coordinating care, ordering studies and medications as appropriate, and documentation.

## 2021-09-27 ENCOUNTER — Telehealth: Payer: Self-pay

## 2021-09-27 DIAGNOSIS — R109 Unspecified abdominal pain: Secondary | ICD-10-CM

## 2021-09-27 NOTE — Telephone Encounter (Signed)
Spoke with patient and told her the date and time of her CT that we were unable to schedule when she was in the office this week.  Patient stated she already has an MRI scheduled for that day.  She is going to call [facility] and reschedule the appointment.  I also told she needed to stop by the lab at some point before the test to have a lab drawn (bmet).  Patient agreed.

## 2021-09-30 ENCOUNTER — Telehealth: Payer: Self-pay

## 2021-09-30 NOTE — Telephone Encounter (Signed)
PA denied.  This can be purchased over-the-counter.  Patient aware and has purchased over-the-counter

## 2021-09-30 NOTE — Telephone Encounter (Signed)
PA submitted for Voltaren Gel 1% gel via CoverMyMeds Key #[id number].

## 2021-10-07 ENCOUNTER — Other Ambulatory Visit: Payer: Self-pay | Admitting: Cardiovascular Disease

## 2021-10-07 DIAGNOSIS — S22069D Unspecified fracture of T7-T8 vertebra, subsequent encounter for fracture with routine healing: Secondary | ICD-10-CM | POA: Diagnosis not present

## 2021-10-07 DIAGNOSIS — Z4789 Encounter for other orthopedic aftercare: Secondary | ICD-10-CM | POA: Diagnosis not present

## 2021-10-08 ENCOUNTER — Other Ambulatory Visit: Payer: Self-pay | Admitting: *Deleted

## 2021-10-08 ENCOUNTER — Telehealth: Payer: Medicare Other

## 2021-10-08 MED ORDER — GABAPENTIN 300 MG PO CAPS: 300.0000 mg | ORAL_CAPSULE | Freq: Three times a day (TID) | ORAL | 1 refills | Status: DC

## 2021-10-09 ENCOUNTER — Other Ambulatory Visit: Payer: Self-pay | Admitting: Cardiovascular Disease

## 2021-10-09 ENCOUNTER — Other Ambulatory Visit (HOSPITAL_BASED_OUTPATIENT_CLINIC_OR_DEPARTMENT_OTHER): Payer: Self-pay

## 2021-10-10 ENCOUNTER — Inpatient Hospital Stay: Admission: RE | Admit: 2021-10-10 | Payer: Medicare Other | Source: Ambulatory Visit

## 2021-10-10 ENCOUNTER — Ambulatory Visit
Admission: RE | Admit: 2021-10-10 | Discharge: 2021-10-10 | Disposition: A | Discharge status: Home / Self Care | Payer: Medicare Other | Source: Ambulatory Visit | Attending: Neurological Surgery | Admitting: Neurological Surgery

## 2021-10-10 DIAGNOSIS — M40204 Unspecified kyphosis, thoracic region: Secondary | ICD-10-CM | POA: Diagnosis not present

## 2021-10-10 DIAGNOSIS — M4714 Other spondylosis with myelopathy, thoracic region: Secondary | ICD-10-CM

## 2021-10-10 DIAGNOSIS — M549 Dorsalgia, unspecified: Secondary | ICD-10-CM | POA: Diagnosis not present

## 2021-10-16 ENCOUNTER — Other Ambulatory Visit (INDEPENDENT_AMBULATORY_CARE_PROVIDER_SITE_OTHER): Payer: Medicare Other

## 2021-10-16 DIAGNOSIS — R109 Unspecified abdominal pain: Secondary | ICD-10-CM | POA: Diagnosis not present

## 2021-10-16 LAB — BASIC METABOLIC PANEL
BUN: 16 mg/dL (ref 6–23)
CO2: 26 mEq/L (ref 19–32)
Calcium: 10.3 mg/dL (ref 8.4–10.5)
Chloride: 102 mEq/L (ref 96–112)
Creatinine, Ser: 0.91 mg/dL (ref 0.40–1.20)
GFR: 62.85 mL/min (ref >60.00)
Glucose, Bld: 109 mg/dL — ABNORMAL HIGH (ref 70–99)
Potassium: 3.9 mEq/L (ref 3.5–5.1)
Sodium: 138 mEq/L (ref 135–145)

## 2021-10-17 ENCOUNTER — Other Ambulatory Visit: Payer: Self-pay

## 2021-10-18 ENCOUNTER — Other Ambulatory Visit: Payer: Self-pay

## 2021-10-18 ENCOUNTER — Ambulatory Visit (INDEPENDENT_AMBULATORY_CARE_PROVIDER_SITE_OTHER)
Admission: RE | Admit: 2021-10-18 | Discharge: 2021-10-18 | Disposition: A | Discharge status: Home / Self Care | Payer: Medicare Other | Source: Ambulatory Visit | Attending: Internal Medicine | Admitting: Internal Medicine

## 2021-10-18 DIAGNOSIS — M4714 Other spondylosis with myelopathy, thoracic region: Secondary | ICD-10-CM | POA: Diagnosis not present

## 2021-10-18 DIAGNOSIS — Z1211 Encounter for screening for malignant neoplasm of colon: Secondary | ICD-10-CM

## 2021-10-18 DIAGNOSIS — R109 Unspecified abdominal pain: Secondary | ICD-10-CM | POA: Diagnosis not present

## 2021-10-18 MED ORDER — IOHEXOL 300 MG/ML  SOLN
100.0000 mL | Freq: Once | INTRAMUSCULAR | Status: AC | PRN
  Administered 2021-10-18: 100 mL via INTRAVENOUS

## 2021-10-21 ENCOUNTER — Telehealth (HOSPITAL_BASED_OUTPATIENT_CLINIC_OR_DEPARTMENT_OTHER): Payer: Self-pay | Admitting: Cardiovascular Disease

## 2021-10-21 MED ORDER — ROSUVASTATIN CALCIUM 40 MG PO TABS: 40.0000 mg | ORAL_TABLET | Freq: Every day | ORAL | 0 refills | Status: AC

## 2021-10-21 NOTE — Telephone Encounter (Signed)
Received fax from [facility] requesting refills for Rosuvastatin 40 mg. Rx request sent to pharmacy with note stating due for yearly visit.

## 2021-10-29 ENCOUNTER — Telehealth: Payer: Self-pay | Admitting: Internal Medicine

## 2021-10-29 NOTE — Telephone Encounter (Signed)
Patient called requested CT results.

## 2021-10-29 NOTE — Telephone Encounter (Signed)
Pt returned call. Informed of results and recommendations as reviewed and documented below by Dr. Lorenso Courier. Verbalized acceptance and understanding.

## 2021-10-29 NOTE — Telephone Encounter (Signed)
As per My Chart:  Ms. Moler, your CT showed that you have a large hiatal hernia. This type of hernia increases your risk for acid reflux. You also have significant degenerative disease in your spine and a reasonable amount of stool burden. I still suspect that your pain is due to musculoskeletal issues. I would recommend that you follow up with your primary care physician for further work up of this issue. If you have any further GI issues, please feel free to contact me for a follow up appointment.  Called pt to inform of above results/recommendations. LVM requesting returned call.

## 2021-12-02 ENCOUNTER — Ambulatory Visit (INDEPENDENT_AMBULATORY_CARE_PROVIDER_SITE_OTHER): Payer: Medicare Other

## 2021-12-02 DIAGNOSIS — G8929 Other chronic pain: Secondary | ICD-10-CM

## 2021-12-02 DIAGNOSIS — I10 Essential (primary) hypertension: Secondary | ICD-10-CM

## 2021-12-02 DIAGNOSIS — E785 Hyperlipidemia, unspecified: Secondary | ICD-10-CM

## 2021-12-02 NOTE — Patient Instructions (Signed)
Visit Information  Thank you for taking time to visit with me today. Please don't hesitate to contact me if I can be of assistance to you before our next scheduled telephone appointment.  Following are the goals we discussed today:  Take medications as prescribed   Attend all scheduled provider appointments Call pharmacy for medication refills 3-7 days in advance of running out of medications Call provider office for new concerns or questions  Continue to follow a low salt / heart healthy diet.  Use relaxation techniques and/ or diversion activities to assist with pain reduction ( distraction, imagery, relaxation, massage, heat and / or cold application)  Our next appointment is by telephone on 01/20/22 at 11 am  Please call the care guide team at [phone] if you need to cancel or reschedule your appointment.   If you are experiencing a Mental Health or [place] or need someone to talk to, please call the Suicide and Crisis Lifeline: 988 call 1-800-273-TALK (toll free, 24 hour hotline)   Patient verbalizes understanding of instructions and care plan provided today and agrees to view in [place]. Active MyChart status confirmed with patient.    [name] RN,BSN,CCM RN Case Manager [place]  [phone]

## 2021-12-02 NOTE — Chronic Care Management (AMB) (Signed)
Chronic Care Management   CCM RN Visit Note  12/02/2021 Name: Morgan Bell MRN: [id number] DOB: Jan 26, 1951  Subjective: Morgan Bell is a 71 y.o. year old female who is a primary care patient of Tonia Ghent, MD. The care management team was consulted for assistance with disease management and care coordination needs.    Engaged with patient by telephone for follow up visit in response to provider referral for case management and/or care coordination services.   Consent to Services:  The patient was given information about Chronic Care Management services, agreed to services, and gave verbal consent prior to initiation of services.  Please see initial visit note for detailed documentation.   Patient agreed to services and verbal consent obtained.   Assessment: Review of patient past medical history, allergies, medications, health status, including review of consultants reports, laboratory and other test data, was performed as part of comprehensive evaluation and provision of chronic care management services.   SDOH (Social Determinants of Health) assessments and interventions performed:    CCM Care Plan  Allergies  Allergen Reactions   Sulfa Antibiotics Rash    SULFA DRUGS    Outpatient Encounter Medications as of 12/02/2021  Medication Sig   acetaminophen (TYLENOL) 500 MG tablet Take 1-2 tablets (500-1,000 mg total) by mouth every 8 (eight) hours as needed.   amLODipine (NORVASC) 5 MG tablet Take 1 tablet (5 mg total) by mouth 2 (two) times daily.   aspirin EC 81 MG tablet Take 1 tablet (81 mg total) by mouth daily. Swallow whole.   calcium-vitamin D (OSCAL WITH D) 500-200 MG-UNIT tablet Take 1 tablet by mouth daily.   cetirizine (ZYRTEC) 10 MG tablet Take 10 mg by mouth daily.   diclofenac Sodium (VOLTAREN) 1 % GEL Apply 2 g topically as needed.   fenofibrate (TRICOR) 145 MG tablet TAKE 1 TABLET BY MOUTH DAILY   gabapentin (NEURONTIN) 300 MG capsule Take 1 capsule  (300 mg total) by mouth 3 (three) times daily.   irbesartan (AVAPRO) 300 MG tablet TAKE 1 TABLET(300 MG) BY MOUTH DAILY   leflunomide (ARAVA) 20 MG tablet Take 20 mg daily by mouth.   potassium chloride SA (KLOR-CON) 20 MEQ tablet Take 1 tablet (20 mEq total) by mouth 2 (two) times daily.   rosuvastatin (CRESTOR) 40 MG tablet Take 1 tablet (40 mg total) by mouth daily. Due for yearly visit this month. Please schedule appointment with Dr. Oval Linsey for refills.   spironolactone (ALDACTONE) 25 MG tablet TAKE 1 TABLET(25 MG) BY MOUTH DAILY   methocarbamol (ROBAXIN) 500 MG tablet TAKE 1 TABLET(500 MG) BY MOUTH EVERY 6 HOURS AS NEEDED FOR MUSCLE SPASMS (Patient not taking: Reported on 09/25/2021)   No facility-administered encounter medications on file as of 12/02/2021.    Patient Active Problem List   Diagnosis Date Noted   Low back pain 08/18/2021   RUQ pain 05/22/2021   Advance care planning 05/02/2021   Urinary frequency 05/02/2021   Vertebral compression fracture ([place]) 05/02/2021   Myelopathy ([place]) 01/13/2021   Hx of migraines 05/17/2020   ICH (intracerebral hemorrhage) ([place]) - R cerebellar ICH d/t HTN 05/13/2020   Lumbar stenosis with neurogenic claudication 10/18/2019   Exertional angina ([place]) 07/16/2018   Dyslipidemia 10/24/2016   TIA (transient ischemic attack) 01/02/2015   Rheumatoid arthritis ([place]) 01/02/2015   HTN (hypertension) 04/10/2014   Melanoma of trunk ([place]) 01/31/2013    Conditions to be addressed/monitored:HTN, HLD, and Chronic pain  Care Plan : [facility] plan of care  Updates made by Dannielle Karvonen, RN since 12/02/2021 12:00 AM     Problem: Chronic disease management education and/ or care coordination.   Priority: High     Long-Range Goal: Development of plan of care to address chronic disease management and/ or care coordination needs.   Start Date: 09/20/2021  Expected End Date: 02/07/2022  Priority: High  Note:   Current Barriers:  Knowledge Deficits related  to plan of care for management of HTN, HLD, and chronic back pain  Chronic Disease Management support and education needs related to HTN, HLD, and chronic back pain  Patient  states she continues to have back/ flank pain.  She reports having follow up and testing with gastroenterologist.  Patient states no further follow up is needed this time.  She will follow back up with her primary provider.  Patient reports next neurologist follow up is 02/18/22 and cardiology follow up is 03/28/22.  Patient states she is waiting for follow up with orthopedic practice regarding shoulder surgery.     RNCM Clinical Goal(s):  Patient will verbalize basic understanding of HTN, HLD, and Chronic back pain disease process and self health management plan as evidenced by patient report and/ or notation in chart take all medications exactly as prescribed and will call provider for medication related questions as evidenced by patient report and / or notation in chart    attend all scheduled medical appointments:   as evidenced by patient report and/ or notation in chart.         continue to work with Consulting civil engineer and/or Social Worker to address care management and care coordination needs related to HTN, HLD, and chronic back pain as evidenced by adherence to CM Team Scheduled appointments     through collaboration with Consulting civil engineer, provider, and care team.   Interventions: 1:1 collaboration with primary care provider regarding development and update of comprehensive plan of care as evidenced by provider attestation and co-signature Inter-disciplinary care team collaboration (see longitudinal plan of care) Evaluation of current treatment plan related to  self management and patient's adherence to plan as established by provider   Hyperlipidemia Interventions:  (Status:  Goal on track:  Yes.) Long Term Goal Medication review performed; medication list updated in electronic medical record.  Counseled on importance of  regular laboratory monitoring as prescribed Reviewed importance of limiting foods high in cholesterol ( heart healthy diet: fruits, vegetables, lean meats, limited sugar intake)  Hypertension Interventions:  (Status:  Goal on track:  Yes.) Long Term Goal Last practice recorded BP readings:  BP Readings from Last 3 Encounters:  08/21/21 (!) 150/93  08/16/21 130/80  07/26/21 120/84  Most recent eGFR/CrCl: No results found for: EGFR  No components found for: CRCL  Evaluation of current treatment plan related to hypertension self management and patient's adherence to plan as established by provider Reviewed medications with patient and discussed importance of compliance Discussed plans with patient for ongoing care management follow up and provided patient with direct contact information for care management team Reviewed scheduled/upcoming provider appointments including:  Advised to follow low salt diet.   Pain Interventions:  (Status:  New goal.) Long Term Goal Pain assessment performed Medications reviewed and compliance encouraged Reviewed provider established plan for pain management Discussed importance of adherence to all scheduled medical appointments Counseled on the importance of reporting any/all new or changed pain symptoms or management strategies to pain management provider Discussed use of relaxation techniques and/or diversional activities to assist with  pain reduction (distraction, imagery, relaxation, massage, acupressure, TENS, heat, and cold application Reviewed with patient prescribed pharmacological and nonpharmacological pain relief strategies   Patient Goals/Self-Care Activities: Take medications as prescribed   Attend all scheduled provider appointments Call pharmacy for medication refills 3-7 days in advance of running out of medications Call provider office for new concerns or questions  Continue to follow a low salt / heart healthy diet.  Use relaxation  techniques and/ or diversion activities to assist with pain reduction ( distraction, imagery, relaxation, massage, heat and / or cold application)        Plan:The patient has been provided with contact information for the care management team and has been advised to call with any health related questions or concerns.  The care management team will reach out to the patient again over the next 2 months. [name] RN,BSN,CCM RN Case Manager [place]  [phone]

## 2021-12-08 DIAGNOSIS — Z4789 Encounter for other orthopedic aftercare: Secondary | ICD-10-CM | POA: Diagnosis not present

## 2021-12-08 DIAGNOSIS — S22069D Unspecified fracture of T7-T8 vertebra, subsequent encounter for fracture with routine healing: Secondary | ICD-10-CM | POA: Diagnosis not present

## 2021-12-10 ENCOUNTER — Telehealth: Payer: Self-pay | Admitting: Family Medicine

## 2021-12-10 DIAGNOSIS — I10 Essential (primary) hypertension: Secondary | ICD-10-CM | POA: Diagnosis not present

## 2021-12-10 DIAGNOSIS — E785 Hyperlipidemia, unspecified: Secondary | ICD-10-CM

## 2021-12-10 MED ORDER — METHOCARBAMOL 500 MG PO TABS: ORAL_TABLET | ORAL | 1 refills | Status: DC

## 2021-12-10 NOTE — Telephone Encounter (Signed)
RX sent

## 2021-12-10 NOTE — Telephone Encounter (Signed)
Pt needs a refill on methocarbamol (ROBAXIN) 500 MG tablet sent to [facility]

## 2021-12-20 ENCOUNTER — Other Ambulatory Visit: Payer: Self-pay | Admitting: Orthopedic Surgery

## 2021-12-20 ENCOUNTER — Telehealth (HOSPITAL_BASED_OUTPATIENT_CLINIC_OR_DEPARTMENT_OTHER): Payer: Self-pay | Admitting: Cardiovascular Disease

## 2021-12-20 DIAGNOSIS — M19011 Primary osteoarthritis, right shoulder: Secondary | ICD-10-CM | POA: Diagnosis not present

## 2021-12-20 DIAGNOSIS — M25511 Pain in right shoulder: Secondary | ICD-10-CM

## 2021-12-20 NOTE — Telephone Encounter (Signed)
? ?  Pre-operative Risk Assessment  ?  ?Patient Name: Morgan Bell  ?DOB: 30-Nov-1950 ?MRN: [id number]  ? ?  ? ?Request for Surgical Clearance   ? ?Procedure:   Right Reverse Total Shoulder Replacement ? ?Date of Surgery:  Clearance 02/25/22                              ?   ?Surgeon:  Marchia Bond, MD ?Surgeon's Group or Practice Name:  [facility] ?Phone number:  [phone] Derek Jack) ?Fax number:  Office: [place]: [phone] ?  ?Type of Clearance Requested:   ?- Medical  ? ?Need to Hold ASA 81 for procedure? ?  ?Type of Anesthesia:  General  ?  ?Additional requests/questions:   None indicated ? ?Signed, ?Francella Solian   ?12/20/2021, 2:44 PM  ? ?

## 2021-12-23 DIAGNOSIS — M5136 Other intervertebral disc degeneration, lumbar region: Secondary | ICD-10-CM | POA: Diagnosis not present

## 2021-12-23 DIAGNOSIS — Z79899 Other long term (current) drug therapy: Secondary | ICD-10-CM | POA: Diagnosis not present

## 2021-12-23 DIAGNOSIS — M150 Primary generalized (osteo)arthritis: Secondary | ICD-10-CM | POA: Diagnosis not present

## 2021-12-23 DIAGNOSIS — M0589 Other rheumatoid arthritis with rheumatoid factor of multiple sites: Secondary | ICD-10-CM | POA: Diagnosis not present

## 2021-12-23 NOTE — Telephone Encounter (Signed)
? ?  Name: Morgan Bell  ?DOB: 04/12/1951  ?MRN: [id number] ? ?Primary Cardiologist: Skeet Latch, MD ? ?Chart reviewed as part of pre-operative protocol coverage. Because of [name]'s past medical history and time since last visit, she will require a follow-up visit in order to better assess preoperative cardiovascular risk. ? ?Last OV was 10/2020, therefore since last seen > 1 year prior, needs office visit. ? ?Pre-op covering staff: ?- Please schedule appointment and call patient to inform them. If patient already had an upcoming appointment within acceptable timeframe, please add "pre-op clearance" to the appointment notes so provider is aware. ?- Please contact requesting surgeon's office via preferred method (i.e, phone, fax) to inform them of need for appointment prior to surgery. ? ?Regarding ASA: based on available records, there does not appear to be a contraindication to holding from cardiac standpoint so will hold off sending to MD but this can be finalized at time of office visit. It does look like rx comes from neurology (hx TIA) so would likely plan to recommend clearance come from neurology team as well if they need to stop for surgery. This can be included in final recommendation at time of cardiology OV. ? ?Charlie Pitter, PA-C  ?12/23/2021, 3:22 PM  ? ?

## 2021-12-24 ENCOUNTER — Telehealth: Payer: Self-pay | Admitting: *Deleted

## 2021-12-24 NOTE — Telephone Encounter (Signed)
CORRECTION: JUST NOTICED FAX # WAS ENTERED INCORRECTLY; CORRECT FAX # [phone] ?

## 2021-12-24 NOTE — Telephone Encounter (Addendum)
Received surgery clearance letter form [facility], re: right reverse total shoulder replacement under general anesthesia. Date of surgery: 02/25/2022. Letter placed on NP desk for completion. ?

## 2021-12-24 NOTE — Telephone Encounter (Signed)
S/w the pt and she is agreeable to plan of car for sooner appt for pre op clearance. Pt has been scheduled to see Laurann Montana, NP 12/31/21 @ 8:50 at [facility] location. Pt thanked  me for the call and the help. I will send notes to NP for upcoming appt. Will send FYI to requesting office the pt has appt  ?

## 2021-12-24 NOTE — Telephone Encounter (Signed)
Patient suffered a stroke in 05/2020. At visit in 08/2021, we discussed pursuing shoulder surgery. Cleared to proceed with surgical procedure with holding aspirin 3 to 5 days prior if indicated with small but acceptable risk of recurrent stroke while off therapy.  Recommend restarting immediately after or once hemodynamically stable. ?

## 2021-12-25 NOTE — Telephone Encounter (Signed)
Surgical clearance letter faxed with NP's note to [facility]. Received confirmation.  ?

## 2021-12-27 ENCOUNTER — Ambulatory Visit
Admission: RE | Admit: 2021-12-27 | Discharge: 2021-12-27 | Disposition: A | Discharge status: Home / Self Care | Payer: Medicare Other | Source: Ambulatory Visit | Attending: Orthopedic Surgery | Admitting: Orthopedic Surgery

## 2021-12-27 DIAGNOSIS — M25511 Pain in right shoulder: Secondary | ICD-10-CM | POA: Diagnosis not present

## 2021-12-30 ENCOUNTER — Telehealth: Payer: Self-pay

## 2021-12-30 NOTE — Telephone Encounter (Signed)
Received Pre-OP form from [facility] for right shoulder replacement surgery. Patient will need surgery clearance appt before form can be filled out. Called and scheduled patient appt for 01/07/22 at 10:30 am.  ?

## 2021-12-31 ENCOUNTER — Encounter (HOSPITAL_BASED_OUTPATIENT_CLINIC_OR_DEPARTMENT_OTHER): Payer: Self-pay | Admitting: Family

## 2021-12-31 ENCOUNTER — Ambulatory Visit (INDEPENDENT_AMBULATORY_CARE_PROVIDER_SITE_OTHER): Payer: Medicare Other | Admitting: Family

## 2021-12-31 ENCOUNTER — Other Ambulatory Visit: Payer: Self-pay

## 2021-12-31 VITALS — BP 142/90 | HR 91 | Ht 60.0 in | Wt 147.5 lb

## 2021-12-31 DIAGNOSIS — Z01810 Encounter for preprocedural cardiovascular examination: Secondary | ICD-10-CM

## 2021-12-31 DIAGNOSIS — E785 Hyperlipidemia, unspecified: Secondary | ICD-10-CM

## 2021-12-31 DIAGNOSIS — I25118 Atherosclerotic heart disease of native coronary artery with other forms of angina pectoris: Secondary | ICD-10-CM | POA: Diagnosis not present

## 2021-12-31 DIAGNOSIS — E781 Pure hyperglyceridemia: Secondary | ICD-10-CM

## 2021-12-31 DIAGNOSIS — I10 Essential (primary) hypertension: Secondary | ICD-10-CM

## 2021-12-31 DIAGNOSIS — I350 Nonrheumatic aortic (valve) stenosis: Secondary | ICD-10-CM

## 2021-12-31 NOTE — Patient Instructions (Signed)
Medication Instructions:  ?Continue your current medications.  ? ?Please check your blood pressure once per day. If your blood pressure is consistently more than 130/80 we will consider changing your blood pressure medicine.  ? ?You may hold Aspirin prior to shoulder surgery if directed by Dr. Mardelle Matte. ? ?*If you need a refill on your cardiac medications before your next appointment, please call your pharmacy* ? ? ?Lab Work: ?None ordered today.  ? ?Testing/Procedures: ?Your EKG today showed normal sinus rhythm and changes associated with high blood pressure.  ? ? ?Follow-Up: ?At [facility], you and your health needs are our priority.  As part of our continuing mission to provide you with exceptional heart care, we have created designated Provider Care Teams.  These Care Teams include your primary Cardiologist (physician) and Advanced Practice Providers (APPs -  Physician Assistants and Nurse Practitioners) who all work together to provide you with the care you need, when you need it. ? ?We recommend signing up for the patient portal called "MyChart".  Sign up information is provided on this After Visit Summary.  MyChart is used to connect with patients for Virtual Visits (Telemedicine).  Patients are able to view lab/test results, encounter notes, upcoming appointments, etc.  Non-urgent messages can be sent to your provider as well.   ?To learn more about what you can do with MyChart, go to [email or web address].   ? ?Your next appointment:   ?In June as scheduled with Dr. Oval Linsey ? ?Other Instructions ? ?Loel Dubonnet, NP will send a note to Dr. Mardelle Matte that you are cleared for your surgery.  ? ?Tips to Measure your Blood Pressure Correctly ? ?Here's what you can do to ensure a correct reading: ? Don't drink a caffeinated beverage or smoke during the 30 minutes before the test. ? Sit quietly for five minutes before the test begins. ? During the measurement, sit in a chair with your feet on the floor and  your arm supported so your elbow is at about heart level. ? The inflatable part of the cuff should completely cover at least 80% of your upper arm, and the cuff should be placed on bare skin, not over a shirt. ? Don't talk during the measurement. ? ?In 2017, new guidelines from the [place], the [employer] of Cardiology, and nine other health organizations lowered the diagnosis of high blood pressure to 130/80 mm Hg or higher for all adults. The guidelines also redefined the various blood pressure categories to now include normal, elevated, Stage 1 hypertension, Stage 2 hypertension, and hypertensive crisis (see "Blood pressure categories"). ? ?Blood pressure categories  ?Blood pressure category SYSTOLIC ?(upper number)  DIASTOLIC ?(lower number)  ?Normal Less than 120 mm Hg and Less than 80 mm Hg  ?Elevated 120-129 mm Hg and Less than 80 mm Hg  ?High blood pressure: Stage 1 hypertension 130-139 mm Hg or 80-89 mm Hg  ?High blood pressure: Stage 2 hypertension 140 mm Hg or higher or 90 mm Hg or higher  ?Hypertensive crisis (consult your doctor immediately) Higher than 180 mm Hg and/or Higher than 120 mm Hg  ?Source: American Heart Association and American Stroke Association. ?For more on getting your blood pressure under control, buy Controlling Your Blood Pressure, a Special Health Report from [facility]. ? ? ?Blood Pressure Log ? ? ?Date ?  ?Time  ?Blood Pressure  ?Example: Nov 1 9 AM 124/78  ? ?    ? ?    ? ?    ? ?    ? ?    ? ?    ? ?    ? ?    ? ? ?  ?

## 2021-12-31 NOTE — Progress Notes (Signed)
? ?Office Visit  ?  ?Patient Name: Morgan Bell ?Date of Encounter: 01/01/2022 ? ?PCP:  Tonia Ghent, MD ?  ?Agenda  ?Cardiologist:  Skeet Latch, MD  ?Advanced Practice Provider:  No care team member to display ?Electrophysiologist:  None    ? ?Chief Complaint  ?  ?Morgan Bell is a 71 y.o. female with a hx of mild nonobstructive coronary disease, hypertension, hyperlipidemia, RA, CVA (age 57, no residual deficits), obesity presents today for preop clearance.  ? ?Past Medical History  ?  ?Past Medical History:  ?Diagnosis Date  ? Arthritis   ? RA  ? Exertional angina ([place]) 07/16/2018  ? CT coronary 09/13/18: Coronary Ca score 62.1, 68% for age/sex matched control, No evidence of obstructive CAD (1-24% LAD, RCA)  ? Family history of adverse reaction to anesthesia   ? son has difficulty waking   ? Headache(784.0)   ? HX MIGRAINES   ? HLD (hyperlipidemia)   ? Hypertension   ? Melanoma ([place])   ? Obesity   ? Stroke [facility])   ? MINI STROKE     21 YRS AGO   ? ?Past Surgical History:  ?Procedure Laterality Date  ? APPLICATION OF ROBOTIC ASSISTANCE FOR SPINAL PROCEDURE N/A 10/18/2019  ? Procedure: APPLICATION OF ROBOTIC ASSISTANCE FOR SPINAL PROCEDURE;  Surgeon: Kristeen Miss, MD;  Location: [place];  Service: Neurosurgery;  Laterality: N/A;  ? CESAREAN SECTION  11/26/1973  ? CESAREAN SECTION  09/22/1975  ? CESAREAN SECTION  04/30/1978  ? HEMORRHOID SURGERY  06/1987  ? LUMBAR LAMINECTOMY/DECOMPRESSION MICRODISCECTOMY N/A 01/15/2021  ? Procedure: Thoracic Seven-Eight Laminectomy with transpedicular discectomy;  Surgeon: Kristeen Miss, MD;  Location: [place];  Service: Neurosurgery;  Laterality: N/A;  Thoracic Seven-Eight Laminectomy with transpedicular discectomy  ? MELANOMA EXCISION WITH SENTINEL LYMPH NODE BIOPSY Right 03/02/2013  ? Procedure: Wide MELANOMA EXCISION Right Abdominal wall WITH SENTINEL LYMPH NODE mapping Right Axilla;  Surgeon: Merrie Roof, MD;  Location: [place];  Service:  General;  Laterality: Right;  ? [place]  2003  ? negative Bruce protocol exercise stress test with no evidence of perfusion abnormality, EF 66%  ? TOTAL HIP ARTHROPLASTY Right 03/2011  ? ? ?Allergies ? ?Allergies  ?Allergen Reactions  ? Sulfa Antibiotics Rash  ?  SULFA DRUGS  ? ? ?History of Present Illness  ?  ?Morgan Bell is a 71 y.o. female with a hx of mild nonobstructive coronary disease, hypertension, hyperlipidemia, RA, CVA (age 89, no residual deficits and 05/2020), obesity, mild aortic stenosis last seen 11/05/2020 by Dr. Oval Linsey. ? ?She is a previous patient of Dr. Debara Pickett.  She has previously been on enalapril but with poor blood pressure control was transitioned to irbesartan.  She has previously has had difficulties with lower extremity edema with high-dose amlodipine.   She has had lower extremity Dopplers due to concerns for lower extremity swelling 2019 which were negative for DVT. She underwent cardiac CTA 09/2018 due to exertional dyspnea revealing mild (1-25%) disease in the LAD and RCA but otherwise unremarkable with coronary calcium score in the 68th percentile.  Echocardiogram 05/2020 in the setting of stroke with ICH of unclear etiology with LVEF 55 to 60%, no RWMA, mild LVH, grade 1 diastolic dysfunction, mild aortic valve stenosis. ? ?She presents today for preop clearance for right shoulder surgery.  Note she also has difficulties with her back but was instructed to have her shoulder fixed first.  Her  exercise tolerance is somewhat limited due to back pain but she does continue to do all of her own housework independently she lives alone.  Reports no chest pain, pressure, tightness.  Reports exertional dyspnea which is unchanged compared to previous and only with more than usual activity.  She has had no lower extremity edema nor orthopnea, PND. ? ?EKGs/Labs/Other Studies Reviewed:  ? ?The following studies were reviewed today: ? ?Echo 05/2020 ?1. Left ventricular  ejection fraction, by estimation, is 55 to 60%. The  ?left ventricle has normal function. The left ventricle has no regional  ?wall motion abnormalities. There is mild concentric left ventricular  ?hypertrophy. Left ventricular diastolic  ?parameters are consistent with Grade I diastolic dysfunction (impaired  ?relaxation).  ? 2. Right ventricular systolic function is normal. The right ventricular  ?size is normal. There is normal pulmonary artery systolic pressure.  ? 3. The mitral valve is grossly normal. No evidence of mitral valve  ?regurgitation.  ? 4. The aortic valve is tricuspid. Aortic valve regurgitation is trivial.  ?Mild aortic valve stenosis.  ? ?Cardiac CTA 2019 ?FINDINGS: ?A 120 kV prospective scan was triggered in the descending thoracic ?aorta at 111 HU's. Axial non-contrast 3 mm slices were carried out ?through the heart. The data set was analyzed on a dedicated work ?station and scored using the [place]. Gantry rotation speed ?was 250 msecs and collimation was .6 mm. No beta blockade and 0.8 mg ?of sl NTG was given. The 3D data set was reconstructed in 5% ?intervals of the 67-82 % of the R-R cycle. Diastolic phases were ?analyzed on a dedicated work station using MPR, MIP and VRT modes. ?The patient received 80 cc of contrast. ?  ?Aorta: Normal size. Ascending aorta 3.4 cm. Minimal calcification of ?the aortic root. No dissection. ?  ?Aortic Valve:  Trileaflet.  No calcifications. ?  ?Coronary Arteries:  Normal coronary origin.  Right dominance. ?  ?RCA is a large dominant artery that gives rise to PDA and PLVB. ?There is minimal (1-24%) calcified plaque proximally. ?  ?Left main is a large artery that gives rise to LAD and LCX arteries. ?  ?LAD is a large vessel. There is minimal (1-24%) calcified plaque in ?the proximal to mid LAD. ?  ?LCX is a non-dominant artery that gives rise to one large OM1 ?branch. There is no plaque. ?  ?Other findings: ?  ?Normal pulmonary vein drainage into the  left atrium. ?  ?Normal let atrial appendage without a thrombus. ?  ?Normal size of the pulmonary artery. ?  ?IMPRESSION: ?1. Coronary calcium score of 62.1. This was 68th percentile for age ?and sex matched control. ?  ?2. Normal coronary origin with right dominance. ?  ?3. No evidence of obstructive CAD. ? ?EKG:  EKG is ordered today.  The ekg ordered today demonstrates NSR 91 bpm with occasional PAC, LVH,  ? ?Recent Labs: ?05/20/2021: ALT 14; Hemoglobin 12.4; Platelets 119.0 ?10/16/2021: BUN 16; Creatinine, Ser 0.91; Potassium 3.9; Sodium 138  ?Recent Lipid Panel ?   ?Component Value Date/Time  ? CHOL 94 04/29/2021 1044  ? CHOL 120 04/23/2018 0829  ? TRIG 278.0 (H) 04/29/2021 1044  ? HDL 19.90 (L) 04/29/2021 1044  ? HDL 17 (L) 04/23/2018 0829  ? CHOLHDL 5 04/29/2021 1044  ? VLDL 55.6 (H) 04/29/2021 1044  ? LDLCALC 49 05/13/2020 2253  ? [place] 41 04/23/2018 0829  ? LDLDIRECT 44.0 04/29/2021 1044  ? ? ?Home Medications  ? ?No outpatient medications have been  marked as taking for the 12/31/21 encounter (Office Visit) with Loel Dubonnet, NP.  ?  ? ?Review of Systems  ?    ?All other systems reviewed and are otherwise negative except as noted above. ? ?Physical Exam  ?  ?VS:  BP (!) 146/92 (BP Location: Right Arm, Patient Position: Sitting, Cuff Size: Normal)   Pulse 91   Ht 5' (1.524 m)   Wt 147 lb 8 oz (66.9 kg)   SpO2 98%   BMI 28.81 kg/m?  , BMI Body mass index is 28.81 kg/m?. ? ?Wt Readings from Last 3 Encounters:  ?12/31/21 147 lb 8 oz (66.9 kg)  ?09/25/21 135 lb 2 oz (61.3 kg)  ?08/21/21 120 lb (54.4 kg)  ?  ?GEN: Well nourished, overweigh, well developed, in no acute distress. ?HEENT: normal. ?Neck: Supple, no JVD, carotid bruits, or masses. ?Cardiac: RRR, no murmurs, rubs, or gallops. No clubbing, cyanosis, edema.  Radials/PT 2+ and equal bilaterally.  ?Respiratory:  Respirations regular and unlabored, clear to auscultation bilaterally. ?GI: Soft, nontender, nondistended. ?MS: No deformity or  atrophy. ?Skin: Warm and dry, no rash. ?Neuro:  Strength and sensation are intact. ?Psych: Normal affect. ? ?Assessment & Plan  ?  ?Preop clearance - According to the Revised Cardiac Risk Index (RCRI), her Perioperative Ri

## 2022-01-01 ENCOUNTER — Telehealth (HOSPITAL_BASED_OUTPATIENT_CLINIC_OR_DEPARTMENT_OTHER): Payer: Self-pay

## 2022-01-01 NOTE — Telephone Encounter (Signed)
Patient is returning call.  °

## 2022-01-01 NOTE — Telephone Encounter (Signed)
RN returned call to patient and she is scheduled for echo on 4/3 at [place] at [facility]  ? ? ? ? ? ? ?----- Message from Loel Dubonnet, NP sent at 01/01/2022  7:46 AM EDT ----- ?Noted after visit that her previous echocardiogram 05/2020 while admitted showed some stiffness of the aortic valve. I have ordered repeat echocardiogram for monitoring. Please contact patient and ask her to have it done before her June appointment with Dr. Oval Linsey. Does not need to be completed prior to her shoulder surgery if she wants to wait. Thanks! ?

## 2022-01-01 NOTE — Telephone Encounter (Addendum)
Attempted to call patient, unable to leave VM. Will also send patient a mychart message.  ? ? ?----- Message from Loel Dubonnet, NP sent at 01/01/2022  7:46 AM EDT ----- ?Noted after visit that her previous echocardiogram 05/2020 while admitted showed some stiffness of the aortic valve. I have ordered repeat echocardiogram for monitoring. Please contact patient and ask her to have it done before her June appointment with Dr. Oval Linsey. Does not need to be completed prior to her shoulder surgery if she wants to wait. Thanks! ?

## 2022-01-07 ENCOUNTER — Ambulatory Visit (INDEPENDENT_AMBULATORY_CARE_PROVIDER_SITE_OTHER): Payer: Medicare Other | Admitting: Family Medicine

## 2022-01-07 ENCOUNTER — Other Ambulatory Visit: Payer: Self-pay

## 2022-01-07 ENCOUNTER — Encounter: Payer: Self-pay | Admitting: Family Medicine

## 2022-01-07 VITALS — BP 142/90 | HR 101 | Temp 98.5°F | Ht 60.0 in | Wt 146.0 lb

## 2022-01-07 DIAGNOSIS — M545 Low back pain, unspecified: Secondary | ICD-10-CM

## 2022-01-07 DIAGNOSIS — I10 Essential (primary) hypertension: Secondary | ICD-10-CM | POA: Diagnosis not present

## 2022-01-07 DIAGNOSIS — N289 Disorder of kidney and ureter, unspecified: Secondary | ICD-10-CM | POA: Diagnosis not present

## 2022-01-07 DIAGNOSIS — N2889 Other specified disorders of kidney and ureter: Secondary | ICD-10-CM | POA: Diagnosis not present

## 2022-01-07 DIAGNOSIS — M48062 Spinal stenosis, lumbar region with neurogenic claudication: Secondary | ICD-10-CM

## 2022-01-07 MED ORDER — GABAPENTIN 300 MG PO CAPS: 300.0000 mg | ORAL_CAPSULE | Freq: Three times a day (TID) | ORAL | Status: DC

## 2022-01-07 MED ORDER — METHOCARBAMOL 500 MG PO TABS: ORAL_TABLET | ORAL | 1 refills | Status: DC

## 2022-01-07 NOTE — Patient Instructions (Addendum)
Let me see about getting the MRI schedule to evaluate your kidney.   ? ?Try taking an extra (5th) tab of gabapentin daily- sedation caution-and see if that helps with pain and if your BP is lower.  Let me know how you are doing next week.   ? ?Take care.  Glad to see you. ?

## 2022-01-07 NOTE — Progress Notes (Signed)
She had f/u with Dr. Ellene Route and is going to see him again. Robaxin helps.  Refill done at [place].  Oxycodone didn't help the pain.   ? ?Prev MRI with 1. Interval left decompressive laminectomy at T8-T9 with improved ?now moderate spinal canal stenosis. There is still mass effect on ?the right cord but without discrete cord signal abnormality. ?2. Multiple chronic thoracolumbar compression deformities again ?noted. No new fracture. ?3. Unchanged large hiatal hernia. ? ?She is scheduled for R shoulder surgery.  D/w pt.   ? ?Discussed previous imaging with indeterminate 1.8 cm left inferior renal pole lesion. Finding could represent a complex cyst versus solid mass. Recommend MRI renal protocol further evaluation. ? ?HTN.  BP has been ~150/90s at home.  She has echo pending.   We talked about treating her pain to help with BP.  She is taking gabapentin '300mg'$ , 4 per day, total '1200mg'$  a day.   ? ?Meds, vitals, and allergies reviewed.  ? ?ROS: Per HPI unless specifically indicated in ROS section  ? ?GEN: nad, alert and oriented ?HEENT: ncat ?NECK: supple w/o LA ?CV: rrr.   ?PULM: ctab, no inc wob ?ABD: soft, +bs ?EXT: no edema ?SKIN: well perfused.  ?Limited right shoulder range of motion at baseline. ? ?

## 2022-01-09 DIAGNOSIS — N289 Disorder of kidney and ureter, unspecified: Secondary | ICD-10-CM | POA: Insufficient documentation

## 2022-01-09 NOTE — Assessment & Plan Note (Deleted)
I suspect her blood pressure elevation is exacerbated by pain and we talked about options, either treating her blood pressure or treating her pain.  I think it makes sense to treat her pain and try increasing her gabapentin gradually to see if that will help.  She can update me about her blood pressure.  She is going to follow-up with cardiology. ? ?Per cardiology note, she is deemed acceptable risk for the planned procedure without additional cardiovascular testing.   ?

## 2022-01-09 NOTE — Assessment & Plan Note (Addendum)
See discussion of back pain, did not yet change her antihypertensive regimen.  We can adjust this in the future if needed.  See after visit summary. ?

## 2022-01-09 NOTE — Assessment & Plan Note (Addendum)
I suspect her blood pressure elevation is exacerbated by pain and we talked about options, either treating her blood pressure or treating her pain.  I think it makes sense to treat her pain and try increasing her gabapentin gradually to see if that will help.  She can update me about her blood pressure.  She is going to follow-up with cardiology. ? ?Per cardiology note, she is deemed acceptable risk for the planned procedure without additional cardiovascular testing.    Discussed with patient at office visit. ?

## 2022-01-09 NOTE — Assessment & Plan Note (Signed)
Discussed getting follow-up imaging done.  See orders. ?

## 2022-01-10 ENCOUNTER — Encounter: Payer: Self-pay | Admitting: *Deleted

## 2022-01-13 ENCOUNTER — Ambulatory Visit (INDEPENDENT_AMBULATORY_CARE_PROVIDER_SITE_OTHER): Payer: Medicare Other

## 2022-01-13 DIAGNOSIS — I350 Nonrheumatic aortic (valve) stenosis: Secondary | ICD-10-CM | POA: Diagnosis not present

## 2022-01-13 DIAGNOSIS — M5136 Other intervertebral disc degeneration, lumbar region: Secondary | ICD-10-CM | POA: Insufficient documentation

## 2022-01-13 DIAGNOSIS — E782 Mixed hyperlipidemia: Secondary | ICD-10-CM | POA: Insufficient documentation

## 2022-01-13 LAB — ECHOCARDIOGRAM COMPLETE
AR max vel: 1.32 cm2
AV Area VTI: 1.39 cm2
AV Area mean vel: 1.39 cm2
AV Mean grad: 3 mmHg
AV Peak grad: 7 mmHg
AV Vena cont: 0.11 cm
Ao pk vel: 1.32 m/s
Area-P 1/2: 3.95 cm2
P 1/2 time: 627 msec
S' Lateral: 2.38 cm

## 2022-01-13 NOTE — Patient Instructions (Signed)
Morgan Bell , ?Thank you for taking time to come for your Medicare Wellness Visit. I appreciate your ongoing commitment to your health goals. Please review the following plan we discussed and let me know if I can assist you in the future.  ? ?Screening recommendations/referrals: ?Colonoscopy: Declined ?Mammogram: Declined ?Bone Density: Done 2021 with Dr Ellene Route. -Repeat every 2 years  ?Recommended yearly ophthalmology/optometry visit for glaucoma screening and checkup ?Recommended yearly dental visit for hygiene and checkup ? ?Vaccinations: ?Influenza vaccine: Done 08/16/2021 - Repeat annually ?Pneumococcal vaccine: Done 08/04/2016 & 08/26/2017 ?Tdap vaccine: Due - recommended every 10 years ?Shingles vaccine: Due - Shingrix is 2 doses 2-6 months apart and over 90% effective     ?Covid-19: Done 11/30/2019, 12/21/2019, 09/25/2020, & 10/23/2021 ? ?Advanced directives: Advance directive discussed with you today. Even though you declined this today, please call our office should you change your mind, and we can give you the proper paperwork for you to fill out.  ? ?Conditions/risks identified: Aim for 30 minutes of exercise - the cubie is a great option for you, 6-8 glasses of water, and 5 servings of fruits and vegetables each day.  ? ?Next appointment: Follow up in one year for your annual wellness visit  ? ? ?Preventive Care 57 Years and Older, Female ?Preventive care refers to lifestyle choices and visits with your health care provider that can promote health and wellness. ?What does preventive care include? ?A yearly physical exam. This is also called an annual well check. ?Dental exams once or twice a year. ?Routine eye exams. Ask your health care provider how often you should have your eyes checked. ?Personal lifestyle choices, including: ?Daily care of your teeth and gums. ?Regular physical activity. ?Eating a healthy diet. ?Avoiding tobacco and drug use. ?Limiting alcohol use. ?Practicing safe sex. ?Taking low-dose  aspirin every day. ?Taking vitamin and mineral supplements as recommended by your health care provider. ?What happens during an annual well check? ?The services and screenings done by your health care provider during your annual well check will depend on your age, overall health, lifestyle risk factors, and family history of disease. ?Counseling  ?Your health care provider may ask you questions about your: ?Alcohol use. ?Tobacco use. ?Drug use. ?Emotional well-being. ?Home and relationship well-being. ?Sexual activity. ?Eating habits. ?History of falls. ?Memory and ability to understand (cognition). ?Work and work Statistician. ?Reproductive health. ?Screening  ?You may have the following tests or measurements: ?Height, weight, and BMI. ?Blood pressure. ?Lipid and cholesterol levels. These may be checked every 5 years, or more frequently if you are over 78 years old. ?Skin check. ?Lung cancer screening. You may have this screening every year starting at age 25 if you have a 30-pack-year history of smoking and currently smoke or have quit within the past 15 years. ?Fecal occult blood test (FOBT) of the stool. You may have this test every year starting at age 64. ?Flexible sigmoidoscopy or colonoscopy. You may have a sigmoidoscopy every 5 years or a colonoscopy every 10 years starting at age 67. ?Hepatitis C blood test. ?Hepatitis B blood test. ?Sexually transmitted disease (STD) testing. ?Diabetes screening. This is done by checking your blood sugar (glucose) after you have not eaten for a while (fasting). You may have this done every 1-3 years. ?Bone density scan. This is done to screen for osteoporosis. You may have this done starting at age 85. ?Mammogram. This may be done every 1-2 years. Talk to your health care provider about how often you should  have regular mammograms. ?Talk with your health care provider about your test results, treatment options, and if necessary, the need for more tests. ?Vaccines  ?Your  health care provider may recommend certain vaccines, such as: ?Influenza vaccine. This is recommended every year. ?Tetanus, diphtheria, and acellular pertussis (Tdap, Td) vaccine. You may need a Td booster every 10 years. ?Zoster vaccine. You may need this after age 68. ?Pneumococcal 13-valent conjugate (PCV13) vaccine. One dose is recommended after age 73. ?Pneumococcal polysaccharide (PPSV23) vaccine. One dose is recommended after age 1. ?Talk to your health care provider about which screenings and vaccines you need and how often you need them. ?This information is not intended to replace advice given to you by your health care provider. Make sure you discuss any questions you have with your health care provider. ?Document Released: 10/26/2015 Document Revised: 06/18/2016 Document Reviewed: 07/31/2015 ?Elsevier Interactive Patient Education ? 2017 [place]. ? ?Fall Prevention in the Home ?Falls can cause injuries. They can happen to people of all ages. There are many things you can do to make your home safe and to help prevent falls. ?What can I do on the outside of my home? ?Regularly fix the edges of walkways and driveways and fix any cracks. ?Remove anything that might make you trip as you walk through a door, such as a raised step or threshold. ?Trim any bushes or trees on the path to your home. ?Use bright outdoor lighting. ?Clear any walking paths of anything that might make someone trip, such as rocks or tools. ?Regularly check to see if handrails are loose or broken. Make sure that both sides of any steps have handrails. ?Any raised decks and porches should have guardrails on the edges. ?Have any leaves, snow, or ice cleared regularly. ?Use sand or salt on walking paths during winter. ?Clean up any spills in your garage right away. This includes oil or grease spills. ?What can I do in the bathroom? ?Use night lights. ?Install grab bars by the toilet and in the tub and shower. Do not use towel bars as  grab bars. ?Use non-skid mats or decals in the tub or shower. ?If you need to sit down in the shower, use a plastic, non-slip stool. ?Keep the floor dry. Clean up any water that spills on the floor as soon as it happens. ?Remove soap buildup in the tub or shower regularly. ?Attach bath mats securely with double-sided non-slip rug tape. ?Do not have throw rugs and other things on the floor that can make you trip. ?What can I do in the bedroom? ?Use night lights. ?Make sure that you have a light by your bed that is easy to reach. ?Do not use any sheets or blankets that are too big for your bed. They should not hang down onto the floor. ?Have a firm chair that has side arms. You can use this for support while you get dressed. ?Do not have throw rugs and other things on the floor that can make you trip. ?What can I do in the kitchen? ?Clean up any spills right away. ?Avoid walking on wet floors. ?Keep items that you use a lot in easy-to-reach places. ?If you need to reach something above you, use a strong step stool that has a grab bar. ?Keep electrical cords out of the way. ?Do not use floor polish or wax that makes floors slippery. If you must use wax, use non-skid floor wax. ?Do not have throw rugs and other things on the floor  that can make you trip. ?What can I do with my stairs? ?Do not leave any items on the stairs. ?Make sure that there are handrails on both sides of the stairs and use them. Fix handrails that are broken or loose. Make sure that handrails are as long as the stairways. ?Check any carpeting to make sure that it is firmly attached to the stairs. Fix any carpet that is loose or worn. ?Avoid having throw rugs at the top or bottom of the stairs. If you do have throw rugs, attach them to the floor with carpet tape. ?Make sure that you have a light switch at the top of the stairs and the bottom of the stairs. If you do not have them, ask someone to add them for you. ?What else can I do to help prevent  falls? ?Wear shoes that: ?Do not have high heels. ?Have rubber bottoms. ?Are comfortable and fit you well. ?Are closed at the toe. Do not wear sandals. ?If you use a stepladder: ?Make sure that it is fu

## 2022-01-14 ENCOUNTER — Ambulatory Visit (INDEPENDENT_AMBULATORY_CARE_PROVIDER_SITE_OTHER): Payer: Medicare Other

## 2022-01-14 VITALS — Wt 146.0 lb

## 2022-01-14 DIAGNOSIS — Z0000 Encounter for general adult medical examination without abnormal findings: Secondary | ICD-10-CM | POA: Diagnosis not present

## 2022-01-14 NOTE — Progress Notes (Signed)
? ?Subjective:  ? Morgan Bell is a 71 y.o. female who presents for Medicare Annual (Subsequent) preventive examination. ? ?Virtual Visit via Telephone Note ? ?I connected with  [name] on 01/14/22 at 10:30 AM EDT by telephone and verified that I am speaking with the correct person using two identifiers. ? ?Location: ?Patient: Home ?Provider: [facility] ?Persons participating in the virtual visit: patient/Nurse Health Advisor ?  ?I discussed the limitations, risks, security and privacy concerns of performing an evaluation and management service by telephone and the availability of in person appointments. The patient expressed understanding and agreed to proceed. ? ?Interactive audio and video telecommunications were attempted between this nurse and patient, however failed, due to patient having technical difficulties OR patient did not have access to video capability.  We continued and completed visit with audio only. ? ?Some vital signs may be absent or patient reported.  ? ?Shawn Carattini Dionne Ano, LPN  ? ?Review of Systems    ? ?Cardiac Risk Factors include: advanced age (>54mn, >>36women);dyslipidemia;hypertension;sedentary lifestyle;Other (see comment), Risk factor comments: hx of TIA ? ?   ?Objective:  ?  ?Today's Vitals  ? 01/14/22 1029  ?Weight: 146 lb (66.2 kg)  ?PainSc: 5   ? ?Body mass index is 28.51 kg/m?. ? ? ?  01/14/2022  ? 10:52 AM 04/16/2021  ?  1:26 PM 01/12/2021  ?  3:17 PM 05/14/2020  ?  4:24 PM 05/13/2020  ? 12:53 PM 10/18/2019  ?  6:03 AM 10/12/2019  ?  1:28 PM  ?Advanced Directives  ?Does Patient Have a Medical Advance Directive? No No No Yes No No No  ?Does patient want to make changes to medical advance directive?    No - Patient declined     ?Would patient like information on creating a medical advance directive? No - Patient declined Yes (MAU/Ambulatory/Procedural Areas - Information given) No - Patient declined No - Patient declined No - Patient declined No - Patient declined No -  Patient declined  ? ? ?Current Medications (verified) ?Outpatient Encounter Medications as of 01/14/2022  ?Medication Sig  ? acetaminophen (TYLENOL) 500 MG tablet Take 1-2 tablets (500-1,000 mg total) by mouth every 8 (eight) hours as needed.  ? amLODipine (NORVASC) 5 MG tablet Take 1 tablet (5 mg total) by mouth 2 (two) times daily.  ? aspirin EC 81 MG tablet Take 1 tablet (81 mg total) by mouth daily. Swallow whole.  ? calcium-vitamin D (OSCAL WITH D) 500-200 MG-UNIT tablet Take 1 tablet by mouth daily.  ? cetirizine (ZYRTEC) 10 MG tablet Take 10 mg by mouth daily.  ? diclofenac Sodium (VOLTAREN) 1 % GEL Apply 2 g topically as needed.  ? fenofibrate (TRICOR) 145 MG tablet TAKE 1 TABLET BY MOUTH DAILY  ? gabapentin (NEURONTIN) 300 MG capsule Take 1-2 capsules (300-600 mg total) by mouth 3 (three) times daily. Goal for 5 tabs a day if needed for pain.  ? irbesartan (AVAPRO) 300 MG tablet TAKE 1 TABLET(300 MG) BY MOUTH DAILY  ? leflunomide (ARAVA) 20 MG tablet Take 20 mg daily by mouth.  ? methocarbamol (ROBAXIN) 500 MG tablet TAKE 1 TABLET(500 MG) BY MOUTH EVERY 6 HOURS AS NEEDED FOR MUSCLE SPASMS  ? potassium chloride SA (KLOR-CON) 20 MEQ tablet Take 1 tablet (20 mEq total) by mouth 2 (two) times daily.  ? rosuvastatin (CRESTOR) 40 MG tablet Take 1 tablet (40 mg total) by mouth daily. Due for yearly visit this month. Please schedule appointment with  Dr. Oval Linsey for refills.  ? spironolactone (ALDACTONE) 25 MG tablet TAKE 1 TABLET(25 MG) BY MOUTH DAILY  ? ?No facility-administered encounter medications on file as of 01/14/2022.  ? ? ?Allergies (verified) ?Sulfa antibiotics  ? ?History: ?Past Medical History:  ?Diagnosis Date  ? Arthritis   ? RA  ? Exertional angina ([place]) 07/16/2018  ? CT coronary 09/13/18: Coronary Ca score 62.1, 68% for age/sex matched control, No evidence of obstructive CAD (1-24% LAD, RCA)  ? Family history of adverse reaction to anesthesia   ? son has difficulty waking   ? Headache(784.0)   ? HX  MIGRAINES   ? HLD (hyperlipidemia)   ? Hypertension   ? Melanoma ([place])   ? Obesity   ? Stroke [facility])   ? MINI STROKE     21 YRS AGO   ? ?Past Surgical History:  ?Procedure Laterality Date  ? APPLICATION OF ROBOTIC ASSISTANCE FOR SPINAL PROCEDURE N/A 10/18/2019  ? Procedure: APPLICATION OF ROBOTIC ASSISTANCE FOR SPINAL PROCEDURE;  Surgeon: Kristeen Miss, MD;  Location: [facility];  Service: Neurosurgery;  Laterality: N/A;  ? CESAREAN SECTION  11/26/1973  ? CESAREAN SECTION  09/22/1975  ? CESAREAN SECTION  04/30/1978  ? HEMORRHOID SURGERY  06/1987  ? LUMBAR LAMINECTOMY/DECOMPRESSION MICRODISCECTOMY N/A 01/15/2021  ? Procedure: Thoracic Seven-Eight Laminectomy with transpedicular discectomy;  Surgeon: Kristeen Miss, MD;  Location: [facility];  Service: Neurosurgery;  Laterality: N/A;  Thoracic Seven-Eight Laminectomy with transpedicular discectomy  ? MELANOMA EXCISION WITH SENTINEL LYMPH NODE BIOPSY Right 03/02/2013  ? Procedure: Wide MELANOMA EXCISION Right Abdominal wall WITH SENTINEL LYMPH NODE mapping Right Axilla;  Surgeon: Merrie Roof, MD;  Location: [facility];  Service: General;  Laterality: Right;  ? [place]  2003  ? negative Bruce protocol exercise stress test with no evidence of perfusion abnormality, EF 66%  ? TOTAL HIP ARTHROPLASTY Right 03/2011  ? ?Family History  ?Problem Relation Age of Onset  ? Alzheimer's disease Mother   ? Heart disease Father   ?     MI, died at 9  ? Tuberculosis Sister   ? Alzheimer's disease Maternal Grandmother   ? Lung cancer Maternal Grandfather   ? Heart attack Paternal Grandfather   ? Diverticulitis Son   ? Hypertension Son   ? ?Social History  ? ?Socioeconomic History  ? Marital status: Divorced  ?  Spouse name: Not on file  ? Number of children: 3  ? Years of education: 75  ? Highest education level: Not on file  ?Occupational History  ?  Employer: [employer]  ?  Comment: Network engineer; raises service dogs.   ?Tobacco Use  ? Smoking status: Never  ? Smokeless  tobacco: Never  ?Vaping Use  ? Vaping Use: Never used  ?Substance and Sexual Activity  ? Alcohol use: No  ? Drug use: No  ? Sexual activity: Never  ?Other Topics Concern  ? Not on file  ?Social History Narrative  ? Retired Network engineer   ? Lives alone.    ? 3 kids (all local)  ?   ? Since her stroke, she can't write well, and has limited mobility - she can't really clean her home anymore 01/14/2022  ? ?Social Determinants of Health  ? ?Financial Resource Strain: Low Risk   ? Difficulty of Paying Living Expenses: Not hard at all  ?Food Insecurity: No Food Insecurity  ? Worried About Charity fundraiser in the Last Year: Never true  ? Ran Out  of Food in the Last Year: Never true  ?Transportation Needs: No Transportation Needs  ? Lack of Transportation (Medical): No  ? Lack of Transportation (Non-Medical): No  ?Physical Activity: Insufficiently Active  ? Days of Exercise per Week: 3 days  ? Minutes of Exercise per Session: 30 min  ?Stress: No Stress Concern Present  ? Feeling of Stress : Only a little  ?Social Connections: Socially Isolated  ? Frequency of Communication with Friends and Family: Twice a week  ? Frequency of Social Gatherings with Friends and Family: Twice a week  ? Attends Religious Services: Never  ? Active Member of Clubs or Organizations: No  ? Attends Archivist Meetings: Never  ? Marital Status: Divorced  ? ? ?Tobacco Counseling ?Counseling given: Not Answered ? ? ?Clinical Intake: ? ?Pre-visit preparation completed: Yes ? ?Pain : 0-10 ?Pain Score: 5  ?Pain Type: Chronic pain ?Pain Location: Back ?Pain Orientation: Mid, Lower ?Pain Descriptors / Indicators: Aching ?Pain Onset: More than a month ago ?Pain Frequency: Intermittent ?Effect of Pain on Daily Activities: hurts to walk around ? ?  ? ?BMI - recorded: 28.51 ?Nutritional Status: BMI 25 -29 Overweight ?Nutritional Risks: None ?Diabetes: No ? ?How often do you need to have someone help you when you read instructions, pamphlets, or other  written materials from your doctor or pharmacy?: 1 - Never ? ?Diabetic? no ? ?Interpreter Needed?: No ? ?Information entered by :: Jenicka Coxe, LPN ? ? ?Activities of Daily Living ? ?  01/14/2022  ? 10:41 AM 7/5/202

## 2022-01-15 ENCOUNTER — Telehealth (HOSPITAL_BASED_OUTPATIENT_CLINIC_OR_DEPARTMENT_OTHER): Payer: Self-pay

## 2022-01-15 NOTE — Telephone Encounter (Addendum)
Results called to patient who verbalizes understanding!  ? ? ? ? ?----- Message from Loel Dubonnet, NP sent at 01/13/2022  4:35 PM EDT ----- ?Echocardiogram shows normal heart pumping function.  Mild thickening and stiffness of the heart muscle-recommend optimal blood pressure control to help prevent progression.  Mild leaking of the aortic valve but no stenosis.  Overall good result.  Continue current medications. ?

## 2022-01-20 ENCOUNTER — Ambulatory Visit (INDEPENDENT_AMBULATORY_CARE_PROVIDER_SITE_OTHER): Payer: Medicare Other

## 2022-01-20 DIAGNOSIS — I10 Essential (primary) hypertension: Secondary | ICD-10-CM

## 2022-01-20 DIAGNOSIS — G8929 Other chronic pain: Secondary | ICD-10-CM

## 2022-01-20 DIAGNOSIS — E782 Mixed hyperlipidemia: Secondary | ICD-10-CM

## 2022-01-20 NOTE — Chronic Care Management (AMB) (Signed)
?Chronic Care Management  ? ?CCM RN Visit Note ? ?01/20/2022 ?Name: Morgan Bell MRN: [id number] DOB: 01/21/1951 ? ?Subjective: ?Morgan Bell is a 71 y.o. year old female who is a primary care patient of Tonia Ghent, MD. The care management team was consulted for assistance with disease management and care coordination needs.   ? ?Engaged with patient by telephone for follow up visit in response to provider referral for case management and/or care coordination services.  ? ?Consent to Services:  ?The patient was given information about Chronic Care Management services, agreed to services, and gave verbal consent prior to initiation of services.  Please see initial visit note for detailed documentation.  ? ?Patient agreed to services and verbal consent obtained.  ? ?Assessment: Review of patient past medical history, allergies, medications, health status, including review of consultants reports, laboratory and other test data, was performed as part of comprehensive evaluation and provision of chronic care management services.  ? ?SDOH (Social Determinants of Health) assessments and interventions performed:   ? ?CCM Care Plan ? ?Allergies  ?Allergen Reactions  ? Sulfa Antibiotics Rash  ?  SULFA DRUGS  ? ? ?Outpatient Encounter Medications as of 01/20/2022  ?Medication Sig  ? acetaminophen (TYLENOL) 500 MG tablet Take 1-2 tablets (500-1,000 mg total) by mouth every 8 (eight) hours as needed.  ? amLODipine (NORVASC) 5 MG tablet Take 1 tablet (5 mg total) by mouth 2 (two) times daily.  ? aspirin EC 81 MG tablet Take 1 tablet (81 mg total) by mouth daily. Swallow whole.  ? calcium-vitamin D (OSCAL WITH D) 500-200 MG-UNIT tablet Take 1 tablet by mouth daily.  ? cetirizine (ZYRTEC) 10 MG tablet Take 10 mg by mouth daily.  ? diclofenac Sodium (VOLTAREN) 1 % GEL Apply 2 g topically as needed.  ? fenofibrate (TRICOR) 145 MG tablet TAKE 1 TABLET BY MOUTH DAILY  ? gabapentin (NEURONTIN) 300 MG capsule Take 1-2 capsules  (300-600 mg total) by mouth 3 (three) times daily. Goal for 5 tabs a day if needed for pain.  ? irbesartan (AVAPRO) 300 MG tablet TAKE 1 TABLET(300 MG) BY MOUTH DAILY  ? leflunomide (ARAVA) 20 MG tablet Take 20 mg daily by mouth.  ? methocarbamol (ROBAXIN) 500 MG tablet TAKE 1 TABLET(500 MG) BY MOUTH EVERY 6 HOURS AS NEEDED FOR MUSCLE SPASMS  ? potassium chloride SA (KLOR-CON) 20 MEQ tablet Take 1 tablet (20 mEq total) by mouth 2 (two) times daily.  ? rosuvastatin (CRESTOR) 40 MG tablet Take 1 tablet (40 mg total) by mouth daily. Due for yearly visit this month. Please schedule appointment with Dr. Oval Linsey for refills.  ? spironolactone (ALDACTONE) 25 MG tablet TAKE 1 TABLET(25 MG) BY MOUTH DAILY  ? ?No facility-administered encounter medications on file as of 01/20/2022.  ? ? ?Patient Active Problem List  ? Diagnosis Date Noted  ? Degeneration of lumbar intervertebral disc 01/13/2022  ? Mixed hyperlipidemia 01/13/2022  ? Renal lesion 01/09/2022  ? Low back pain 08/18/2021  ? RUQ pain 05/22/2021  ? Advance care planning 05/02/2021  ? Urinary frequency 05/02/2021  ? Vertebral compression fracture ([place]) 05/02/2021  ? Myelopathy ([place]) 01/13/2021  ? Hx of migraines 05/17/2020  ? 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 Lumbar stenosis with neurogenic claudication 10/18/2019  ? Exertional angina ([place]) 07/16/2018  ? Dyslipidemia 10/24/2016  ? TIA (transient ischemic attack) 01/02/2015  ? Rheumatoid arthritis ([place]) 01/02/2015  ? HTN (hypertension) 04/10/2014  ? Melanoma of  trunk ([place]) 01/31/2013  ? ? ?Conditions to be addressed/monitored:HTN, HLD, and pain ? ?Care Plan : RNCM plan of care  ?Updates made by Dannielle Karvonen, RN since 01/20/2022 12:00 AM  ?  ? ?Problem: Chronic disease management education and/ or care coordination.   ?Priority: High  ?  ? ?Long-Range Goal: Development of plan of care to address chronic disease management and/ or care coordination needs.   ?Start Date:  09/20/2021  ?Expected End Date: 03/12/2022  ?Priority: High  ?Note:   ?Current Barriers:  ?Knowledge Deficits related to plan of care for management of HTN, HLD, and chronic back pain  ?Chronic Disease Management support and education needs related to HTN, HLD, and chronic back pain  ?Patient  states she is scheduled to have a renal MRI on 01/23/2022 for an evaluation.  Patient states she continues to monitor her blood pressure daily. She reports her blood pressures range from 148/93 to 150's/ 90's.  Patient reports back pain today is a 2.  She states her back pain is a little better since taking the the muscle relaxer and extra gabapentin as advised by her doctor.   She states she is scheduled to see the orthopedic surgeon on 01/22/22.   Patient reports next neurologist follow up is 02/18/22 and cardiology follow up is 03/28/22.  ?RNCM Clinical Goal(s):  ?Patient will verbalize basic understanding of HTN, HLD, and Chronic back pain disease process and self health management plan as evidenced by patient report and/ or notation in chart ?take all medications exactly as prescribed and will call provider for medication related questions as evidenced by patient report and / or notation in chart    ?attend all scheduled medical appointments:   as evidenced by patient report and/ or notation in chart.         ?continue to work with Consulting civil engineer and/or Social Worker to address care management and care coordination needs related to HTN, HLD, and chronic back pain as evidenced by adherence to CM Team Scheduled appointments     through collaboration with RN Care manager, provider, and care team.  ? ?Interventions: ?1:1 collaboration with primary care provider regarding development and update of comprehensive plan of care as evidenced by provider attestation and co-signature ?Inter-disciplinary care team collaboration (see longitudinal plan of care) ?Evaluation of current treatment plan related to  self management and patient's  adherence to plan as established by provider ? ? ?Hyperlipidemia Interventions:  (Status:  Goal on track:  Yes.) Long Term Goal ?Medication review performed; medication list updated in electronic medical record.  ?Counseled on importance of regular laboratory monitoring as prescribed ?Reviewed importance of limiting foods high in cholesterol ( heart healthy diet: fruits, vegetables, lean meats, limited sugar intake) ?Reviewed scheduled / upcoming appointments ? ?Hypertension Interventions:  (Status:  Goal on track:  Yes.) Long Term Goal ?Last practice recorded BP readings:  ?BP Readings from Last 3 Encounters:  ?01/07/22 (!) 142/90  ?01/01/22 (!) 142/90  ?09/25/21 (!) 140/98  ?Most recent eGFR/CrCl: No results found for: EGFR  No components found for: CRCL ? ?Evaluation of current treatment plan related to hypertension self management and patient's adherence to plan as established by provider ?Reviewed medications with patient and discussed importance of compliance ?Discussed plans with patient for ongoing care management follow up and provided patient with direct contact information for care management team ?Reviewed scheduled/upcoming provider appointments  ?Advised to follow low salt diet.  ?Advised to monitor blood pressure daily and record ? ?Pain  Interventions:  (Status:  Goal on track:  Yes.) Long Term Goal ?Pain assessment performed ?Medications reviewed and compliance encouraged ?Reviewed provider established plan for pain management ?Discussed importance of adherence to all scheduled medical appointments ?Counseled on the importance of reporting any/all new or changed pain symptoms or management strategies to pain management provider ?Discussed use of relaxation techniques and/or diversional activities to assist with pain reduction (distraction, imagery, relaxation, massage, acupressure, TENS, heat, and cold application ?Reviewed with patient prescribed pharmacological and nonpharmacological pain relief  strategies  ?Reviewed scheduled/ upcoming appointment ?Advised patient that muscle relaxer and gabapentin can be sedative.  ? ?Patient Goals/Self-Care Activities: ?Continue to take medications as prescribed

## 2022-01-20 NOTE — Patient Instructions (Signed)
Visit Information ? ?Thank you for taking time to visit with me today. Please don't hesitate to contact me if I can be of assistance to you before our next scheduled telephone appointment. ? ?Following are the goals we discussed today:  ?Continue to take medications as prescribed   ?Attend all scheduled provider appointments ?Call pharmacy for medication refills 3-7 days in advance of running out of medications ?Call provider office for new concerns or questions  ?Continue to follow a low salt / heart healthy diet.  ?Use relaxation techniques and/ or diversion activities to assist with pain reduction ( distraction, imagery, relaxation, massage, heat and / or cold application) ?When taking sedative medications ( medications that cause drowsiness) do not operate heavy machinery, avoid alcohol, don't mix sedatives together or with other medications that have similar effects ? ?Our next appointment is by telephone on 03-19-2022 at 11:00 am ? ?Please call the care guide team at [phone] if you need to cancel or reschedule your appointment.  ? ?If you are experiencing a Mental Health or [place] or need someone to talk to, please call the Suicide and Crisis Lifeline: 988 ?call 1-800-273-TALK (toll free, 24 hour hotline)  ? ?Patient verbalizes understanding of instructions and care plan provided today and agrees to view in [place]. Active MyChart status confirmed with patient.   ? ?[name] RN,BSN,CCM ?RN Case Manager ?[place]  ?[phone] ? ?

## 2022-01-22 DIAGNOSIS — M4714 Other spondylosis with myelopathy, thoracic region: Secondary | ICD-10-CM | POA: Diagnosis not present

## 2022-01-22 DIAGNOSIS — Z6828 Body mass index (BMI) 28.0-28.9, adult: Secondary | ICD-10-CM | POA: Diagnosis not present

## 2022-01-22 DIAGNOSIS — M5414 Radiculopathy, thoracic region: Secondary | ICD-10-CM | POA: Diagnosis not present

## 2022-01-23 ENCOUNTER — Ambulatory Visit
Admission: RE | Admit: 2022-01-23 | Discharge: 2022-01-23 | Disposition: A | Discharge status: Home / Self Care | Payer: Medicare Other | Source: Ambulatory Visit | Attending: Family Medicine | Admitting: Family Medicine

## 2022-01-23 DIAGNOSIS — N289 Disorder of kidney and ureter, unspecified: Secondary | ICD-10-CM

## 2022-01-28 ENCOUNTER — Telehealth: Payer: Self-pay | Admitting: Family Medicine

## 2022-01-28 NOTE — Telephone Encounter (Signed)
?  Encourage patient to contact the pharmacy for refills or they can request refills through [facility] ? ?Did the patient contact the pharmacy:  N ? ? ?LAST APPOINTMENT DATE:  3.28.23 ?NEXT APPOINTMENT DATE:  not scheduled ? ?MEDICATION: methocarbamol (ROBAXIN) 500 MG tablet ? ?Is the patient out of medication?  (One day left) ? ?If not, how much is left? ? ?Is this a 90 day supply: N ? ?PHARMACY:  ? ?[id number], [place] Phone:  [phone]  ?Fax:  [phone]  ?  ? ? ?Let patient know to contact pharmacy at the end of the day to make sure medication is ready. ? ?Please notify patient to allow 48-72 hours to process ?  ?

## 2022-01-28 NOTE — Telephone Encounter (Signed)
Patient calling to follow-up re: MRI. ? ?At [place], they couldn't get an IV in ? ?They need Dr. Damita Dunnings to order  the MRI with IV Contrast for   ? ?Once they receive the order, Morgan Bell will contact the patient to schedule ?

## 2022-01-29 MED ORDER — METHOCARBAMOL 500 MG PO TABS
ORAL_TABLET | ORAL | 1 refills | Status: DC
Start: 2022-01-29 — End: 2022-02-21

## 2022-01-29 NOTE — Addendum Note (Signed)
Addended by: Tonia Ghent on: 01/29/2022 10:42 AM ? ? Modules accepted: Orders ? ?

## 2022-01-29 NOTE — Telephone Encounter (Signed)
Sent. Thanks.   

## 2022-01-30 NOTE — Telephone Encounter (Signed)
Ashtyn-can you use the order that is in orders that need a new order entirely?  Please let me know.  Thanks. ?

## 2022-02-04 NOTE — Telephone Encounter (Signed)
Patient calling to follow up. Please call patient back with an update so she knows how to proceed. [phone] ?

## 2022-02-05 NOTE — Telephone Encounter (Signed)
Please let me know about her order status.  Thanks.   ?

## 2022-02-05 NOTE — Progress Notes (Signed)
Sent message, via epic in basket, requesting orders in epic from surgeon.  

## 2022-02-09 DIAGNOSIS — E782 Mixed hyperlipidemia: Secondary | ICD-10-CM | POA: Diagnosis not present

## 2022-02-09 DIAGNOSIS — I10 Essential (primary) hypertension: Secondary | ICD-10-CM | POA: Diagnosis not present

## 2022-02-10 ENCOUNTER — Other Ambulatory Visit: Payer: Self-pay | Admitting: Cardiovascular Disease

## 2022-02-10 ENCOUNTER — Other Ambulatory Visit: Payer: Self-pay | Admitting: Family Medicine

## 2022-02-10 NOTE — Progress Notes (Signed)
PCP - Elsie Stain clearance on chart 01-09-22, LOV 01-07-22 on chart ?Cardiologist - Tiffany Anthon,MD clearance 12-31-21 in epic Laurann Montana, NP ? ?PPM/ICD -  ?Device Orders -  ?Rep Notified -  ? ?Chest x-ray -  ?EKG - 12-31-21 epic ?Stress Test -  ?ECHO - 01-13-22 epic ?Cardiac Cath -  ? ?Sleep Study -  ?CPAP -  ? ?Fasting Blood Sugar -  ?Checks Blood Sugar _____ times a day ? ?Blood Thinner Instructions: ?Aspirin Instructions: ? ?ERAS Protcol - ?PRE-SURGERY Ensure or G2-  ? ? ?COVID vaccine - ? ?Activity-- ?Anesthesia review: HTN , TIA, Mild aortic stenosis noted on 2021 echo . Not noted on 2023 echo ? ?Patient denies shortness of breath, fever, cough and chest pain at PAT appointment ? ? ?All instructions explained to the patient, with a verbal understanding of the material. Patient agrees to go over the instructions while at home for a better understanding. Patient also instructed to self quarantine after being tested for COVID-19. The opportunity to ask questions was provided. ?  ?

## 2022-02-10 NOTE — Patient Instructions (Signed)
DUE TO COVID-19 ONLY TWO VISITORS  (aged 73 and older)  ARE ALLOWED TO COME WITH YOU AND STAY IN THE WAITING ROOM ONLY DURING PRE OP AND PROCEDURE.   ? ?**NO VISITORS ARE ALLOWED IN THE SHORT STAY AREA OR RECOVERY ROOM!!** ? ? ? Your procedure is scheduled on: 02-25-22 ? ? Report to [facility] Main Entrance ? ?  Report to admitting at    [place] AM ? ? Call this number if you have problems the morning of surgery [phone] ? ? Do not eat food :After Midnight. ? ? After Midnight you may have the following liquids until __0430____ AM/ DAY OF SURGERY  Then nothing by mouth ? ?Water ?Black Coffee (sugar ok, NO MILK/CREAM OR CREAMERS)  ?Tea (sugar ok, NO MILK/CREAM OR CREAMERS) regular and decaf                             ?Plain Jell-O (NO RED)                                           ?Fruit ices (not with fruit pulp, NO RED)                                     ?Popsicles (NO RED)                                                                  ?Juice: apple, WHITE grape, WHITE cranberry ?Sports drinks like Gatorade (NO RED) ?Clear broth(vegetable,chicken,beef) ? ?             ?  ?  ?The day of surgery:  ?Drink ONE (1) Pre-Surgery Clear Ensure or G2 at AM the morning of surgery. Drink in one sitting. Do not sip.  ?This drink was given to you during your hospital  ?pre-op appointment visit. ?Nothing else to drink after completing the  ?Pre-Surgery Clear Ensure or G2. ?  ?       If you have questions, please contact your surgeon?s office. ? ? ?FOLLOW ANY ADDITIONAL PRE OP INSTRUCTIONS YOU RECEIVED FROM YOUR SURGEON'S OFFICE!!! ?  ?  ?Oral Hygiene is also important to reduce your risk of infection.                                    ?Remember - BRUSH YOUR TEETH THE MORNING OF SURGERY WITH YOUR REGULAR TOOTHPASTE ? ? Do NOT smoke after Midnight ? ? Take these medicines the morning of surgery with A SIP OF WATER: Leflunamide, gabapentin, fenofibrate, zyrtec, amlodipine ? ?                ?           You may not have  any metal on your body including hair pins, jewelry, and body piercing ? ?           Do not wear make-up, lotions, powders, perfumes/cologne, or deodorant ? ?Do not  wear nail polish including gel and S&S, artificial/acrylic nails, or any other type of covering on natural nails including finger and toenails. If you have artificial nails, gel coating, etc. that needs to be removed by a nail salon please have this removed prior to surgery or surgery may need to be canceled/ delayed if the surgeon/ anesthesia feels like they are unable to be safely monitored.  ? ?Do not shave  48 hours prior to surgery.  ? ?        ? ? Do not bring valuables to the hospital. [facility] NOT ?            RESPONSIBLE   FOR VALUABLES. ? ? Contacts, dentures or bridgework may not be worn into surgery. ? ? ?            Please read over the following fact sheets you were given: IF [place] [phone] ? ?   [facility] - Preparing for Surgery ?Before surgery, you can play an important role.  Because skin is not sterile, your skin needs to be as free of germs as possible.  You can reduce the number of germs on your skin by washing with CHG (chlorahexidine gluconate) soap before surgery.  CHG is an antiseptic cleaner which kills germs and bonds with the skin to continue killing germs even after washing. ?Please DO NOT use if you have an allergy to CHG or antibacterial soaps.  If your skin becomes reddened/irritated stop using the CHG and inform your nurse when you arrive at Short Stay. ?Do not shave (including legs and underarms) for at least 48 hours prior to the first CHG shower.  You may shave your face/neck. ?Please follow these instructions carefully: ? 1.  Shower with CHG Soap the night before surgery and the  morning of Surgery. ? 2.  If you choose to wash your hair, wash your hair first as usual with your  normal  shampoo. ? 3.  After you shampoo, rinse your hair and body  thoroughly to remove the  shampoo.                           4.  Use CHG as you would any other liquid soap.  You can apply chg directly  to the skin and wash  ?                     Gently with a scrungie or clean washcloth. ? 5.  Apply the CHG Soap to your body ONLY FROM THE NECK DOWN.   Do not use on face/ open      ?                     Wound or open sores. Avoid contact with eyes, ears mouth and genitals (private parts).  ?                     Production manager,  Genitals (private parts) with your normal soap. ?            6.  Wash thoroughly, paying special attention to the area where your surgery  will be performed. ? 7.  Thoroughly rinse your body with warm water from the neck down. ? 8.  DO NOT shower/wash with your normal soap after using and rinsing off  the CHG Soap. ?  9.  Pat yourself dry with a clean towel. ?           10.  Wear clean pajamas. ?           11.  Place clean sheets on your bed the night of your first shower and do not  sleep with pets. ?Day of Surgery : ?Do not apply any lotions/deodorants the morning of surgery.  Please wear clean clothes to the hospital/surgery center. ? ?FAILURE TO FOLLOW THESE INSTRUCTIONS MAY RESULT IN THE CANCELLATION OF YOUR SURGERY ?PATIENT SIGNATURE_________________________________ ? ?NURSE SIGNATURE__________________________________ ? ?________________________________________________________________________  ?

## 2022-02-11 ENCOUNTER — Telehealth: Payer: Self-pay

## 2022-02-11 ENCOUNTER — Other Ambulatory Visit: Payer: Self-pay | Admitting: Family Medicine

## 2022-02-11 DIAGNOSIS — N2889 Other specified disorders of kidney and ureter: Secondary | ICD-10-CM

## 2022-02-11 NOTE — Telephone Encounter (Signed)
Refill request for gabapentin (NEURONTIN) 300 MG capsule ? ?LOV - 01/07/22 ?Next OV - not scheduled ?Last refill - 01/07/22 was put in as no print ? ?Refill request for amlodipine needs to be refused as patient takes 5 mg not 10 mg. ?

## 2022-02-11 NOTE — Telephone Encounter (Signed)
Looks like there is a message from 01/10/22 about all of this and she needed a new order to have done. Message was then forwarded to referrals to check on.  ?

## 2022-02-11 NOTE — Telephone Encounter (Signed)
Patient called states she called two weeks ago bout getting order changed for MRI that she needed to have for her Kidneys. She has not received call back. She is concerned about getting that set up. I do not see order in chart.  ?

## 2022-02-12 ENCOUNTER — Inpatient Hospital Stay (HOSPITAL_COMMUNITY)
Admission: RE | Admit: 2022-02-12 | Discharge: 2022-02-12 | Disposition: A | Discharge status: Home / Self Care | Payer: Medicare Other | Source: Ambulatory Visit

## 2022-02-12 NOTE — Telephone Encounter (Signed)
Called patient and informed her that everything looks good on our end for her scan and she can call to schedule this. Number has been provided to patient to schedule and advised to call back if she has any issues getting scheduled.  ?

## 2022-02-12 NOTE — Addendum Note (Signed)
Addended by: Tonia Ghent on: 02/12/2022 11:09 AM ? ? Modules accepted: Orders ? ?

## 2022-02-12 NOTE — Telephone Encounter (Signed)
I put in the new order.  Please update patient and let me know if she has trouble getting scheduled.  Thanks.  ?

## 2022-02-12 NOTE — Telephone Encounter (Signed)
Done. Thanks.

## 2022-02-12 NOTE — Telephone Encounter (Signed)
Noted. Please update patient.  Thanks.  ?

## 2022-02-12 NOTE — Telephone Encounter (Addendum)
Precert for this order was checked back in April - no auth required per [employer].  ?Pt was scheduled on 01/23/2022 at 11:30 with [facility].  ? ?She cancelled her appt it looks like on 01/23/2022 - no reason given other than "patient cancelled" ?01/23/2022 12:07 PM  ?By: Malcolm Metro   ?Cancel Rsn: Patient  ? ?It looks like the new order that Dr Damita Dunnings placed (same order as in April) with the location now as [facility]. The patient can to call to reschedule this. I have done everything from the referral stand point back in April.  ?The number for the patient to call to schedule is [phone] ?

## 2022-02-13 ENCOUNTER — Other Ambulatory Visit: Payer: Self-pay | Admitting: Cardiovascular Disease

## 2022-02-13 NOTE — Telephone Encounter (Signed)
Rx(s) sent to pharmacy electronically.  

## 2022-02-18 ENCOUNTER — Ambulatory Visit: Payer: Medicare Other | Admitting: Adult Health

## 2022-02-18 ENCOUNTER — Encounter: Payer: Self-pay | Admitting: Adult Health

## 2022-02-18 VITALS — BP 126/84 | HR 82 | Ht 60.0 in | Wt 152.0 lb

## 2022-02-18 DIAGNOSIS — I614 Nontraumatic intracerebral hemorrhage in cerebellum: Secondary | ICD-10-CM

## 2022-02-18 NOTE — Patient Instructions (Signed)
Continue aspirin 81 mg daily  and Crestor for secondary stroke prevention ? ?Continue to follow up with PCP regarding cholesterol and blood pressure management  ?Maintain strict control of hypertension with blood pressure goal below 130/90 and cholesterol with LDL cholesterol (bad cholesterol) goal below 70 mg/dL.  ? ?Signs of a Stroke? Follow the BEFAST method:  ?Balance Watch for a sudden loss of balance, trouble with coordination or vertigo ?Eyes Is there a sudden loss of vision in one or both eyes? Or double vision?  ?Face: Ask the person to smile. Does one side of the face droop or is it numb?  ?Arms: Ask the person to raise both arms. Does one arm drift downward? Is there weakness or numbness of a leg? ?Speech: Ask the person to repeat a simple phrase. Does the speech sound slurred/strange? Is the person confused ? ?Time: If you observe any of these signs, call 911. ? ? ? ? ?As you have been doing well from a stroke standpoint, you can follow-up with [place] on an as-needed basis.  We will continue to follow with your primary care doctor Dr. Damita Dunnings for aggressive stroke risk factor management ? ? ? ? ? ?Thank you for coming to see [place] at [facility]. I hope we have been able to provide you high quality care today. ? ?You may receive a patient satisfaction survey over the next few weeks. We would appreciate your feedback and comments so that we may continue to improve ourselves and the health of our patients. ? ?

## 2022-02-18 NOTE — Progress Notes (Signed)
?[facility] ?[address] ?[place] [address] ?[phone] ? ?     STROKE FOLLOW UP NOTE ? ?[name] ?Date of Birth:  [birth year] ?Medical Record Number:  [id number]  ? ?Reason for Referral: stroke follow up ? ? ? ?SUBJECTIVE: ? ? ?CHIEF COMPLAINT:  ?Chief Complaint  ?Patient presents with  ? Follow-up  ?  RM 3 alone here for 6 month f/u. PT reports she has been doing ok since last f/u waiting to see pain clinic (scheduled for Mar 22, 2022)  ? ? ? ? ?HPI:  ? ?Morgan Bell is a very pleasant 71 y.o. 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  Returns today for routine follow-up. ? ? ?Update 02/18/2022 JM: Returns for follow-up visit after prior visit 6 months ago.  Overall stable without new stroke/TIA symptoms.  Residual deficits stable.  Compliant on aspirin, Crestor and fenofibrate, denies side effects.  Blood pressure today 126/84.  She was scheduled for shoulder replacement on 5/16 but cancelled due to lower back pain. She is scheduled to see pain clinic 5/23. Continues to follow with Dr. Ellene Route for low back issues. Ambulates with RW, no recent falls. Requesting handicap placard today.  No further concerns at this time.  ? ? ? ? ?History provided for reference purposes only ?Update 08/21/2021 JM: Returns for 52-monthstroke follow-up unaccompanied  ? ?Stable from stroke standpoint -denies new stroke/TIA symptoms ?Residual decreased right hand dexterity stable ? ?Compliant on aspirin, Crestor and fenofibrate -denies side effects ?Blood pressure today 150/93 ? ?After prior visit, referred to ortho for right shoulder pain worsened post fall - they recommended right total shoulder replacement initially scheduled 10/13 but she put this off as she is concerned that she does not currently have any additional help at home while recovering  ? ?Recently completed therapy for back issues but continues to have  back pain which has been gradually worsening - she is not sure if worsening pain is due to issues she has been having with RLQ pain - currently waiting to schedule initial eval with GI ? ?No further concerns at this time ? ? ?Update 04/22/2021 JM: Ms. BKuwaharareturns for 669-monthtroke follow-up.  She has been stable from stroke standpoint without new stroke/TIA symptoms but unfortunately started to have multiple falls with BLE weakness and presented to UC on 01/12/2021 s[facility]p fall [facility].  She was found to have right foot metatarsal fracture as well as large disc herniation at the T7-T8 level with cord compression which requires surgical decompression.  She was transferred to short-term SNF rehab due to continued BLE weakness and gait difficulty.  She has since returned back home beginning of June and [facility] she does report continued gradual improvement.  She is able to ambulate short distance with RW and 1 person assistance.  She denies any recent falls. She has been experiencing right shoulder pain with movement which has been gradually worsening since returning home.  She questions possible injury with her prior fall.  She does report prior history of right shoulder partial rotator cuff tear which had previously been stable.  Her sons are helping with assistance as needed such as providing meals and getting groceries.  Residual stroke deficits of decreased right hand dexterity stable.  Compliant on aspirin, Crestor and fenofibrate for secondary stroke prevention without associated side effects.  Blood pressure  today 154/94.  She does have visit to establish care with new PCP Dr. Damita Dunnings 7/18 as her prior PCP left office (per patient).  No further concerns at this time. ? ?Update 11/07/2020 JM: Morgan Bell returns to discuss disability paperwork.  She has not returned back to work since her stroke on 05/13/2020 due to residual gait impairment,  imbalance and decreased right hand dexterity.  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  She is currently ambulating with a four-point cane and has difficulty with longer distance ambulation.  She was previously using single-point cane after her lumbar laminectomy in 10/2019 but was able to ambulate longer distance without difficulty.  She does not feel as though she would be able to return back to work at this point and be able to maintain current job requirements.  She is hopeful that she will be able to return to work at some point and wishes to wait for at least 1 full year after her stroke for max potential recovery. ? ?Update 10/18/2020 JM: Morgan Bell returns for prolonged 78-monthstroke follow-up with prior visit on 06/14/2020.  Residual gait impairment with some improvement and right hand decreased dexterity stable without worsening. Currently using a cane for ambulation but has recently been experiencing worsening back pain.  History of back pain undergoing surgical procedure approximately 1 year ago.  She was unsure pain related to prior back procedure or poststroke due to gait abnormality.  Previously prescribed gabapentin 100 mg 4 times daily but self increased currently taking 300/200/300 with great benefit.  She has been able to increase activity and denies any side effects. Denies new stroke/TIA symptoms.  Repeat CT head 06/2020 showed resolution of prior ICH therefore restarted aspirin 81 mg daily for secondary stroke prevention.  She has remained on aspirin 81 mg daily, Crestor and fenofibrate without side effects.  Blood pressure today 127/84. Monitors at home which has been stable.  No further concerns at this time. ? ?Initial visit 06/14/2020 JM: Ms. BEllermanis being seen for hospital follow-up ?She has since returned home from SNF stay ?Reports continued deficits imbalance, and right hand weakness  ?She does report improvement but not yet to  baseline ?[facility]  and nurse.  Reports SLP will be continued as she previously was experiencing cognitive impairment with delayed recall but this has since improved ?Ambulates long distance with RW but is able to ambulate short distance without assistive device ?Currently living alone maintaining all ADLs and majority of IADLs without difficulty  ?Her son lives nearby and assist as needed ?Denies new or worsening stroke/TIA symptoms ?Continues on Crestor and fenofibrate without side effects ?Blood pressure today 135/91.  Routinely monitors at home and typically 130s/90s.  Reports compliance with amlodipine 5 mg daily and Avapro 300 mg daily.   ?She has had follow-up with PCP since discharge ?No further concerns at this time ? ?Stroke admission 05/13/2020 ?Ms. SGEOVANNA SIMKOis a 71y.o. female with history of hypertension with questionable compliance to medication, headaches, angina, rheumatoid arthritis, TIA 21 years ago with no residual deficits who presented on 05/13/2020 with dizziness and difficulty walking.  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  Aspirin PTA discontinued in setting of ICH. Hx of HTN initially on Cleviprex stabilize during admission and resumed home meds of amlodipine and Avapro. Of note, questionable medication noncompliance.  Hx of HLD with LDL 49 and recommended resuming home  dose Crestor and fenofibrate at discharge due to [place] (held during admission).  Other stroke risk factors include advanced age, history of TIA and migraines.  Other active problems include RA on Arava and melanoma.  Evaluated by therapy initially recommending home health services but due to lack of assistance at home, discharge to SNF for functional decline and further therapy needs. ? ?Stroke:   R cerebellar ICH secondary to hypertensive source ?CT head R cerebellar hemorrhage w/ mild surrounding edema. Small vessel disease. Atrophy. Sinus dz. ?CTA head & neck  no vascular abnormality. Mild atherosclerosis. Tortuosity.   ?MRI w/w/o R cerebellar hemorrhage w/ mild edema. No lesion. R basal ganglia and thalamic chronic microhemorrhages. ?2D Echo EF 55-60%. No source of embolus

## 2022-02-19 NOTE — Telephone Encounter (Signed)
Pt is scheduled for MRI 03/03/22.  ? ? ?

## 2022-02-21 ENCOUNTER — Other Ambulatory Visit: Payer: Self-pay | Admitting: Family Medicine

## 2022-02-25 ENCOUNTER — Ambulatory Visit (HOSPITAL_COMMUNITY): Admission: RE | Admit: 2022-02-25 | Payer: Medicare Other | Source: Home / Self Care | Admitting: Orthopedic Surgery

## 2022-02-25 ENCOUNTER — Encounter (HOSPITAL_COMMUNITY): Admission: RE | Payer: Self-pay | Source: Home / Self Care

## 2022-02-25 SURGERY — ARTHROPLASTY, SHOULDER, TOTAL, REVERSE
Anesthesia: Choice | Site: Shoulder | Laterality: Right

## 2022-02-26 ENCOUNTER — Telehealth: Payer: Self-pay | Admitting: Family Medicine

## 2022-02-26 NOTE — Telephone Encounter (Signed)
Melissa from the [facility] has called stating the patient needs authorization for the upcoming MRI. Once this is completed you may giver her a call at [phone] ext [phone] ?

## 2022-02-26 NOTE — Telephone Encounter (Signed)
Forwarding to A.Green  ?

## 2022-02-26 NOTE — Telephone Encounter (Signed)
Approved by [employer] ?Notes made on appt desk and within order ? ?Auth# [id number]       Authorized ?Approval Valid Through: 02/26/2022 - 03/27/2022 ?

## 2022-02-26 NOTE — Telephone Encounter (Signed)
Called Morgan Bell back with [employer], left her a detailed message with Auth information.  ? ?Nothing further needed.  ? ?

## 2022-03-03 ENCOUNTER — Ambulatory Visit (HOSPITAL_COMMUNITY)
Admission: RE | Admit: 2022-03-03 | Discharge: 2022-03-03 | Disposition: A | Discharge status: Home / Self Care | Payer: Medicare Other | Source: Ambulatory Visit | Attending: Family Medicine | Admitting: Family Medicine

## 2022-03-03 DIAGNOSIS — N2889 Other specified disorders of kidney and ureter: Secondary | ICD-10-CM | POA: Diagnosis present

## 2022-03-03 MED ORDER — GADOBUTROL 1 MMOL/ML IV SOLN
7.5000 mL | Freq: Once | INTRAVENOUS | Status: AC | PRN
  Administered 2022-03-03: 7.5 mL via INTRAVENOUS

## 2022-03-04 ENCOUNTER — Telehealth: Payer: Self-pay

## 2022-03-04 ENCOUNTER — Telehealth: Payer: Medicare Other

## 2022-03-13 NOTE — Telephone Encounter (Signed)
  Care Management   Follow Up Note   03/11/2022 Name: GEANIE PACIFICO MRN: [id number] DOB: Jul 29, 1951   Referred by: Tonia Ghent, MD Reason for referral : Chronic Care Management   An unsuccessful telephone outreach was attempted today. The patient was referred to the case management team for assistance with care management and care coordination.  Unable to leave voice message due to voicemail not being set up.  Follow Up Plan: The care management team will reach out to the patient again over the next 14 days.    [name] RN,BSN,CCM RN Case Manager [place]  [phone]

## 2022-03-13 DEATH — deceased

## 2022-03-28 ENCOUNTER — Telehealth: Payer: Self-pay | Admitting: Family Medicine

## 2022-03-28 ENCOUNTER — Ambulatory Visit (HOSPITAL_BASED_OUTPATIENT_CLINIC_OR_DEPARTMENT_OTHER): Payer: Medicare Other | Admitting: Cardiovascular Disease

## 2022-03-28 NOTE — Telephone Encounter (Signed)
JAARS Laser called to let Dr. Damita Dunnings know that patient Morgan Bell passed away on 27-Mar-2023. Please advise thank you.  Callback Number: [phone]

## 2022-03-30 NOTE — Telephone Encounter (Signed)
Duly noted.  Please process the chart.  I was glad to see this kind lady in clinic.

## 2022-03-31 NOTE — Telephone Encounter (Signed)
Please process chart as instructed by Dr. Damita Dunnings.

## 2022-04-14 ENCOUNTER — Other Ambulatory Visit: Payer: Self-pay | Admitting: Cardiovascular Disease

## 2022-05-29 ENCOUNTER — Ambulatory Visit: Payer: Self-pay

## 2022-05-29 NOTE — Chronic Care Management (AMB) (Signed)
Care Management    RN Visit Note  05/29/2022 Name: Morgan Bell MRN: [id number] DOB: 06-02-1951  Subjective: Morgan Bell is a 71 y.o. year old female who is a primary care patient of Tonia Ghent, MD. The care management team was consulted for assistance with disease management and care coordination needs.    Patient deceased.   Consent to Services:   Ms. Rake was given information about Care Management services today including:  Care Management services includes personalized support from designated clinical staff supervised by her physician, including individualized plan of care and coordination with other care providers 24/7 contact phone numbers for assistance for urgent and routine care needs. The patient may stop case management services at any time by phone call to the office staff.  Patient agreed to services and consent obtained.   Assessment: Review of patient past medical history, allergies, medications, health status, including review of consultants reports, laboratory and other test data, was performed as part of comprehensive evaluation and provision of chronic care management services.   SDOH (Social Determinants of Health) assessments and interventions performed:    Care Plan  Allergies  Allergen Reactions   Sulfa Antibiotics Rash    SULFA DRUGS    Outpatient Encounter Medications as of 05/29/2022  Medication Sig   acetaminophen (TYLENOL) 500 MG tablet Take 1-2 tablets (500-1,000 mg total) by mouth every 8 (eight) hours as needed.   amLODipine (NORVASC) 5 MG tablet TAKE 1 TABLET(5 MG) BY MOUTH TWICE DAILY   aspirin EC 81 MG tablet Take 1 tablet (81 mg total) by mouth daily. Swallow whole.   calcium-vitamin D (OSCAL WITH D) 500-200 MG-UNIT tablet Take 2 tablets by mouth daily.   cetirizine (ZYRTEC) 10 MG tablet Take 10 mg by mouth daily.   diclofenac Sodium (VOLTAREN) 1 % GEL Apply 2 g topically as needed.   fenofibrate (TRICOR) 145 MG tablet TAKE  1 TABLET BY MOUTH DAILY   gabapentin (NEURONTIN) 300 MG capsule Take 1-2 tabs up to 3 times per day, up to 5 pills per day.   irbesartan (AVAPRO) 300 MG tablet TAKE 1 TABLET(300 MG) BY MOUTH DAILY   leflunomide (ARAVA) 20 MG tablet Take 20 mg daily by mouth.   methocarbamol (ROBAXIN) 500 MG tablet TAKE 1 TABLET(500 MG) BY MOUTH EVERY 6 HOURS AS NEEDED FOR MUSCLE SPASMS   Pediatric Multivit-Minerals (SMARTY PANTS KIDS COMPLETE PO) Take 4 each by mouth daily.   potassium chloride SA (KLOR-CON M) 20 MEQ tablet TAKE 1 TABLET BY MOUTH TWICE DAILY   rosuvastatin (CRESTOR) 40 MG tablet Take 1 tablet (40 mg total) by mouth daily. Due for yearly visit this month. Please schedule appointment with Dr. Oval Linsey for refills.   spironolactone (ALDACTONE) 25 MG tablet TAKE 1 TABLET(25 MG) BY MOUTH DAILY   Turmeric 500 MG CAPS Take 500 mg by mouth daily.   vitamin C (ASCORBIC ACID) 500 MG tablet Take 500 mg by mouth daily.   No facility-administered encounter medications on file as of 05/29/2022.    Patient Active Problem List   Diagnosis Date Noted   Degeneration of lumbar intervertebral disc 01/13/2022   Mixed hyperlipidemia 01/13/2022   Renal lesion 01/09/2022   Low back pain 08/18/2021   RUQ pain 05/22/2021   Advance care planning 05/02/2021   Urinary frequency 05/02/2021   Vertebral compression fracture ([place]) 05/02/2021   Myelopathy ([place]) 01/13/2021   Hx of migraines 05/17/2020   ICH (intracerebral hemorrhage) ([place]) - R cerebellar ICH d/t HTN  05/13/2020   Lumbar stenosis with neurogenic claudication 10/18/2019   Exertional angina ([place]) 07/16/2018   Dyslipidemia 10/24/2016   TIA (transient ischemic attack) 01/02/2015   Rheumatoid arthritis ([place]) 01/02/2015   HTN (hypertension) 04/10/2014   Melanoma of trunk ([place]) 01/31/2013    Conditions to be addressed/monitored: patient deceased  Care Plan : [facility] plan of care  Updates made by Dannielle Karvonen, RN since 05/29/2022 12:00 AM     Problem:  Chronic disease management education and/ or care coordination.   Priority: High     Long-Range Goal: Development of plan of care to address chronic disease management and/ or care coordination needs. Completed 05/29/2022  Start Date: 09/20/2021  Expected End Date: 03/12/2022  Priority: High  Note:   Patient deceased. Closed Current Barriers:  Knowledge Deficits related to plan of care for management of HTN, HLD, and chronic back pain  Chronic Disease Management support and education needs related to HTN, HLD, and chronic back pain  Patient  states she is scheduled to have a renal MRI on 01/23/2022 for an evaluation.  Patient states she continues to monitor her blood pressure daily. She reports her blood pressures range from 148/93 to 150's/ 90's.  Patient reports back pain today is a 2.  She states her back pain is a little better since taking the the muscle relaxer and extra gabapentin as advised by her doctor.   She states she is scheduled to see the orthopedic surgeon on 01/22/22.   Patient reports next neurologist follow up is 02/18/22 and cardiology follow up is 03/28/22.  RNCM Clinical Goal(s):  Patient will verbalize basic understanding of HTN, HLD, and Chronic back pain disease process and self health management plan as evidenced by patient report and/ or notation in chart take all medications exactly as prescribed and will call provider for medication related questions as evidenced by patient report and / or notation in chart    attend all scheduled medical appointments:   as evidenced by patient report and/ or notation in chart.         continue to work with Consulting civil engineer and/or Social Worker to address care management and care coordination needs related to HTN, HLD, and chronic back pain as evidenced by adherence to CM Team Scheduled appointments     through collaboration with Consulting civil engineer, provider, and care team.   Interventions: 1:1 collaboration with primary care provider regarding  development and update of comprehensive plan of care as evidenced by provider attestation and co-signature Inter-disciplinary care team collaboration (see longitudinal plan of care) Evaluation of current treatment plan related to  self management and patient's adherence to plan as established by provider   Hyperlipidemia Interventions:  (Status:  Goal on track:  Yes.) Long Term Goal Medication review performed; medication list updated in electronic medical record.  Counseled on importance of regular laboratory monitoring as prescribed Reviewed importance of limiting foods high in cholesterol ( heart healthy diet: fruits, vegetables, lean meats, limited sugar intake) Reviewed scheduled / upcoming appointments  Hypertension Interventions:  (Status:  Goal on track:  Yes.) Long Term Goal Last practice recorded BP readings:  BP Readings from Last 3 Encounters:  01/07/22 (!) 142/90  01/01/22 (!) 142/90  09/25/21 (!) 140/98  Most recent eGFR/CrCl: No results found for: EGFR  No components found for: CRCL  Evaluation of current treatment plan related to hypertension self management and patient's adherence to plan as established by provider Reviewed medications with patient and discussed importance of  compliance Discussed plans with patient for ongoing care management follow up and provided patient with direct contact information for care management team Reviewed scheduled/upcoming provider appointments  Advised to follow low salt diet.  Advised to monitor blood pressure daily and record  Pain Interventions:  (Status:  Goal on track:  Yes.) Long Term Goal Pain assessment performed Medications reviewed and compliance encouraged Reviewed provider established plan for pain management Discussed importance of adherence to all scheduled medical appointments Counseled on the importance of reporting any/all new or changed pain symptoms or management strategies to pain management provider Discussed use  of relaxation techniques and/or diversional activities to assist with pain reduction (distraction, imagery, relaxation, massage, acupressure, TENS, heat, and cold application Reviewed with patient prescribed pharmacological and nonpharmacological pain relief strategies  Reviewed scheduled/ upcoming appointment Advised patient that muscle relaxer and gabapentin can be sedative.   Patient Goals/Self-Care Activities: Continue to take medications as prescribed   Attend all scheduled provider appointments Call pharmacy for medication refills 3-7 days in advance of running out of medications Call provider office for new concerns or questions  Continue to follow a low salt / heart healthy diet.  Use relaxation techniques and/ or diversion activities to assist with pain reduction ( distraction, imagery, relaxation, massage, heat and / or cold application) When taking sedative medications ( medications that cause drowsiness) do not operate heavy machinery, avoid alcohol, don't mix sedatives together or with other medications that have similar effects        [name] RN,BSN,CCM RN Care Manager Coordinator [phone]

## 2022-06-12 IMAGING — US US ABDOMEN LIMITED
1 series · 14 of 25 positions shown · non-contrast
Comparison: October 05, 2015.

CLINICAL DATA: Right upper quadrant abdominal pain for 2 weeks.

EXAM:
ULTRASOUND ABDOMEN LIMITED RIGHT UPPER QUADRANT

[Series 1: us abdomen limited · 0.28mm/px · 14 of 46 slices shown]
[im 1/46]
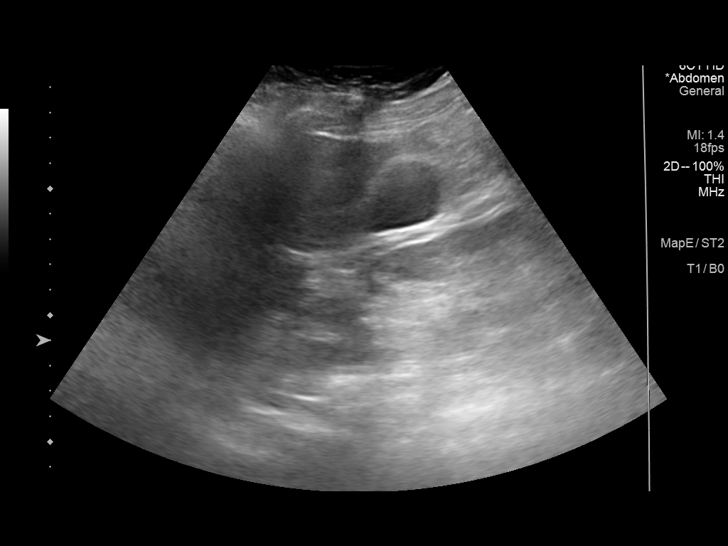
[im 4/46]
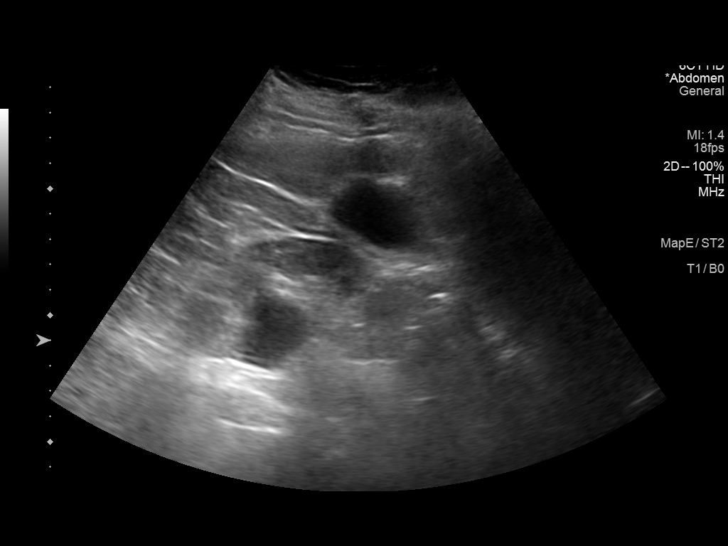
[im 8/46]
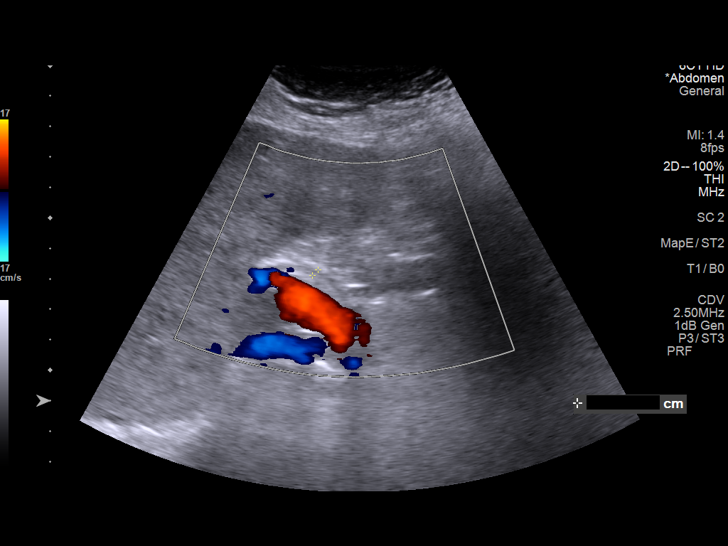
[im 12/46]
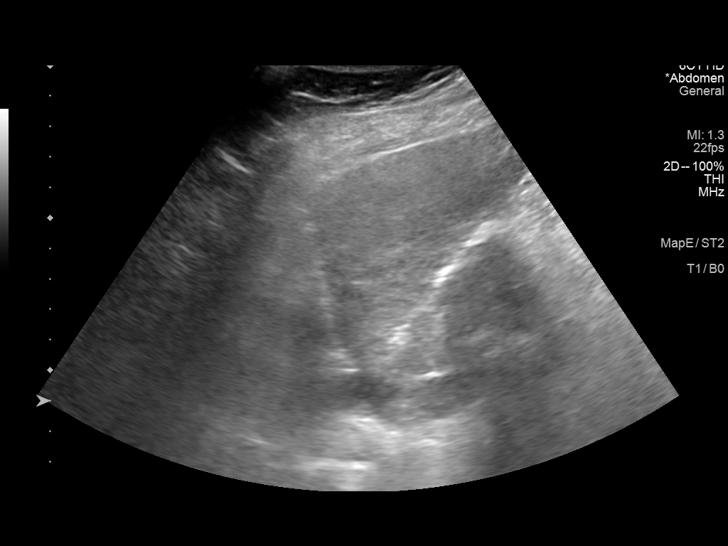
[im 16/46]
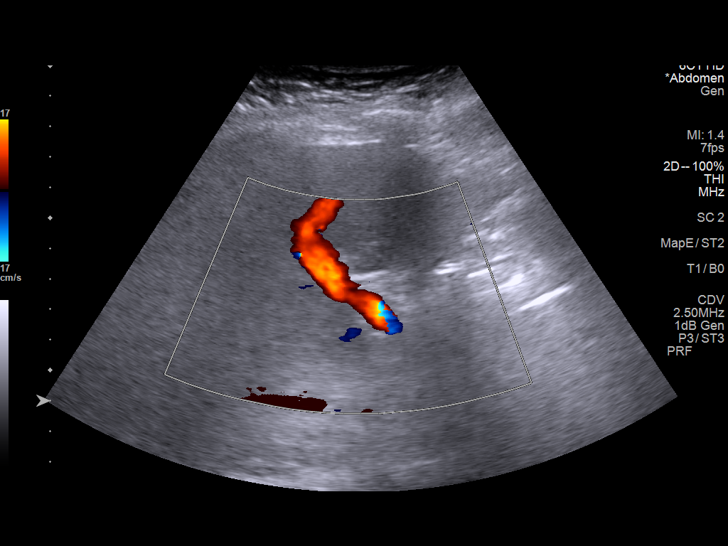
[im 17/46]
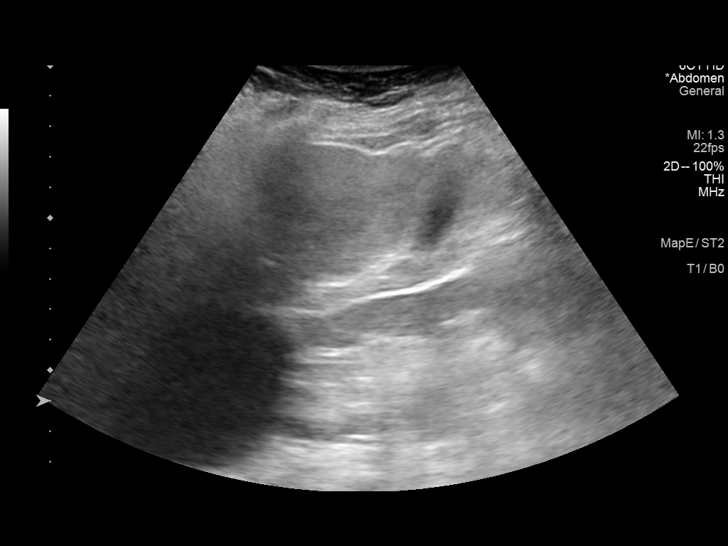
[im 21/46]
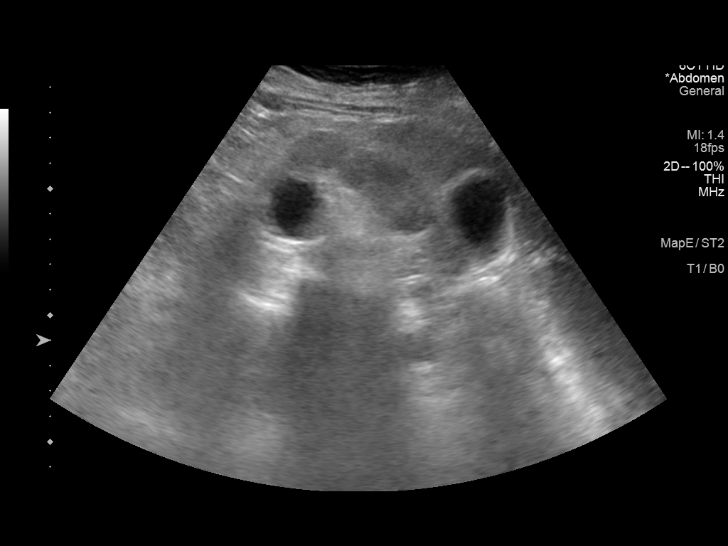
[im 25/46]
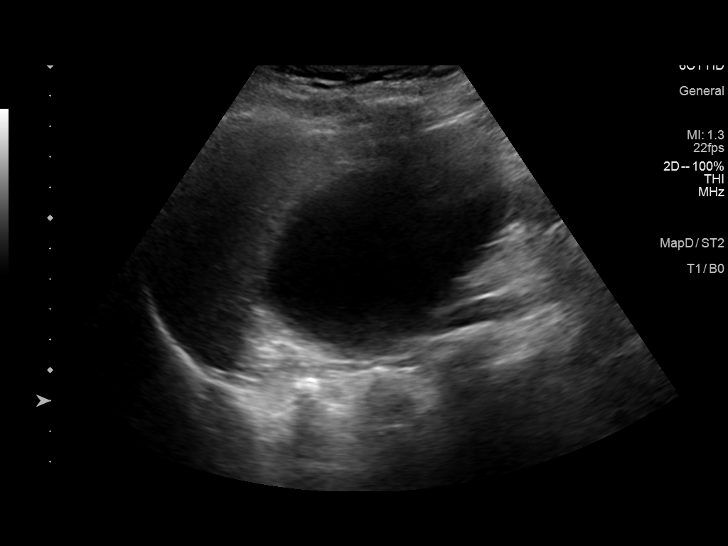
[im 29/46]
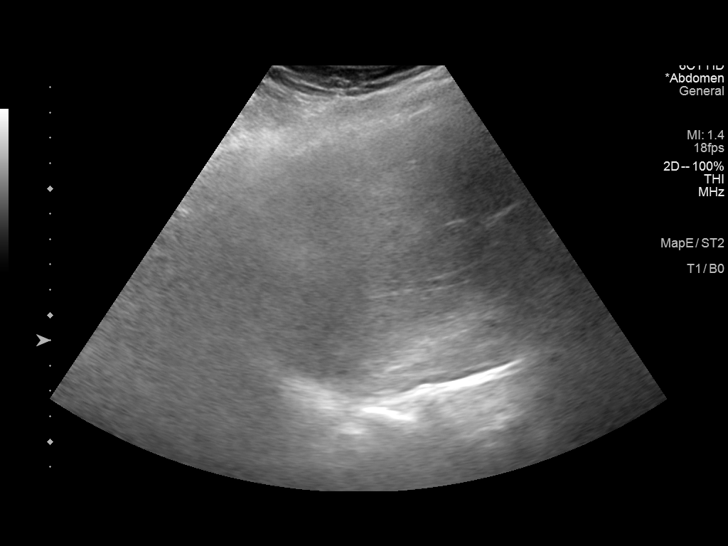
[im 31/46]
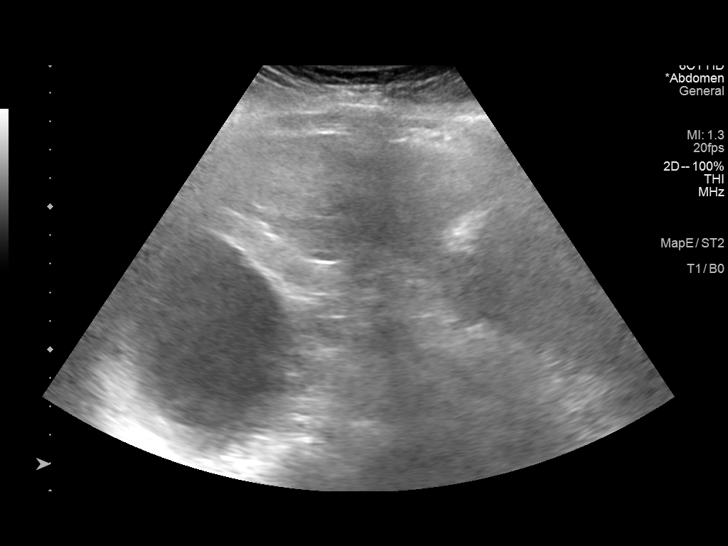
[im 34/46]
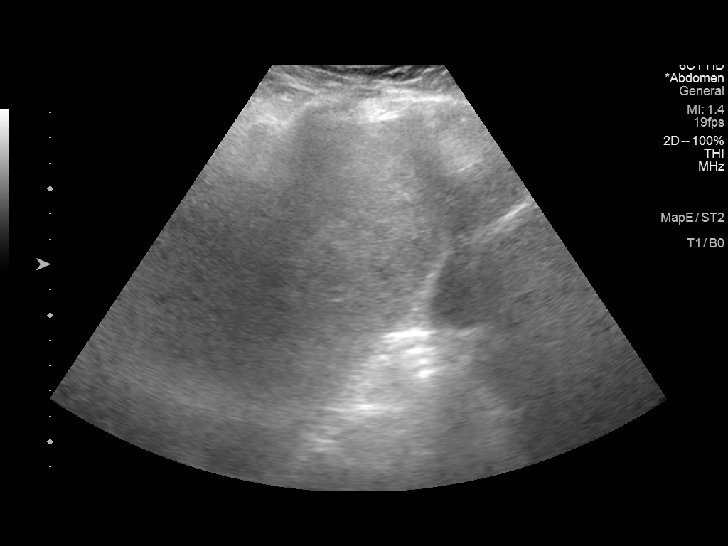
[im 38/46]
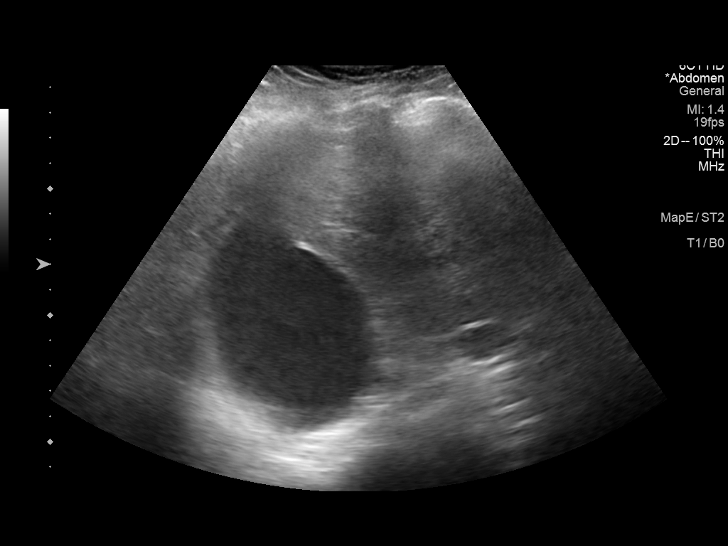
[im 42/46]
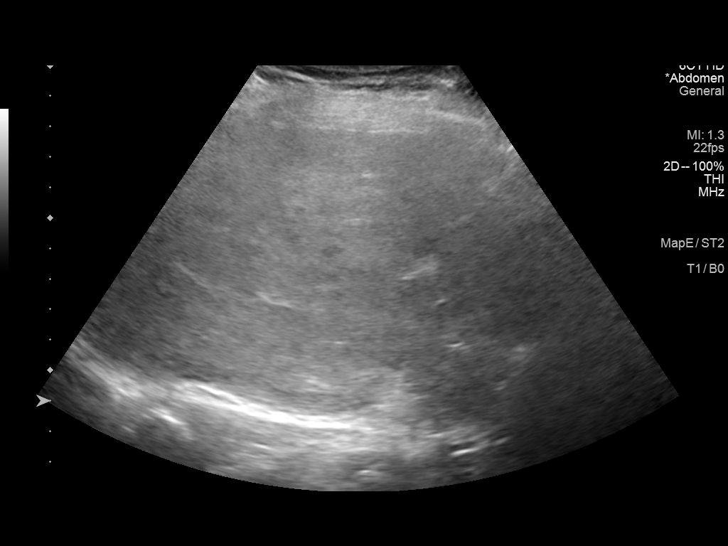
[im 46/46]
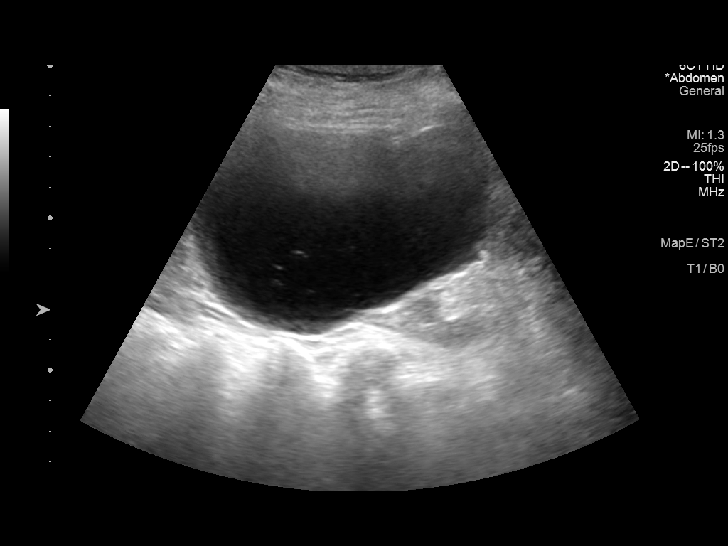

[14 of 25 positions shown; findings below may reference images not displayed]

FINDINGS: Gallbladder:

No gallstones or wall thickening visualized. No sonographic Murphy
sign noted by sonographer.

Common bile duct:

Diameter: 3 mm which is within normal limits.

Liver:

No focal lesion identified. Increased echogenicity of hepatic
parenchyma is noted suggesting hepatic steatosis. Portal vein is
patent on color Doppler imaging with normal direction of blood flow
towards the liver.

Other: 2 right renal cysts are noted, the largest measuring 9.4 cm.
IMPRESSION: Increased echogenicity of hepatic parenchyma is noted suggesting
hepatic steatosis.

Two right renal cysts are noted, the largest measuring 9.4 cm.
